# Patient Record
Sex: Male | Born: 1965
Health system: Southern US, Community
[De-identification: ages and names within clinical notes are randomized; demographics above are authoritative.]

## PROBLEM LIST (undated history)

## (undated) DIAGNOSIS — R7301 Impaired fasting glucose: Secondary | ICD-10-CM

## (undated) DIAGNOSIS — I219 Acute myocardial infarction, unspecified: Secondary | ICD-10-CM

## (undated) DIAGNOSIS — R002 Palpitations: Secondary | ICD-10-CM

## (undated) DIAGNOSIS — K219 Gastro-esophageal reflux disease without esophagitis: Secondary | ICD-10-CM

## (undated) DIAGNOSIS — E039 Hypothyroidism, unspecified: Secondary | ICD-10-CM

## (undated) DIAGNOSIS — E079 Disorder of thyroid, unspecified: Secondary | ICD-10-CM

## (undated) DIAGNOSIS — Z720 Tobacco use: Secondary | ICD-10-CM

## (undated) DIAGNOSIS — N529 Male erectile dysfunction, unspecified: Secondary | ICD-10-CM

## (undated) DIAGNOSIS — I10 Essential (primary) hypertension: Secondary | ICD-10-CM

## (undated) DIAGNOSIS — I82409 Acute embolism and thrombosis of unspecified deep veins of unspecified lower extremity: Secondary | ICD-10-CM

## (undated) DIAGNOSIS — E785 Hyperlipidemia, unspecified: Secondary | ICD-10-CM

## (undated) DIAGNOSIS — I3139 Other pericardial effusion (noninflammatory): Secondary | ICD-10-CM

## (undated) DIAGNOSIS — E119 Type 2 diabetes mellitus without complications: Secondary | ICD-10-CM

## (undated) DIAGNOSIS — J45909 Unspecified asthma, uncomplicated: Secondary | ICD-10-CM

## (undated) DIAGNOSIS — I313 Pericardial effusion (noninflammatory): Secondary | ICD-10-CM

## (undated) DIAGNOSIS — I2510 Atherosclerotic heart disease of native coronary artery without angina pectoris: Secondary | ICD-10-CM

## (undated) HISTORY — DX: Unspecified asthma, uncomplicated: J45.909

## (undated) HISTORY — DX: Hyperlipidemia, unspecified: E78.5

## (undated) HISTORY — DX: Atherosclerotic heart disease of native coronary artery without angina pectoris: I25.10

## (undated) HISTORY — DX: Impaired fasting glucose: R73.01

## (undated) HISTORY — DX: Essential (primary) hypertension: I10

## (undated) HISTORY — DX: Gastro-esophageal reflux disease without esophagitis: K21.9

## (undated) HISTORY — DX: Tobacco use: Z72.0

## (undated) HISTORY — DX: Palpitations: R00.2

## (undated) HISTORY — DX: Acute myocardial infarction, unspecified: I21.9

## (undated) HISTORY — DX: Disorder of thyroid, unspecified: E07.9

---

## 1998-03-01 HISTORY — PX: CARDIAC CATHETERIZATION: SHX172

## 1998-10-14 ENCOUNTER — Encounter (HOSPITAL_COMMUNITY): Admission: RE | Admit: 1998-10-14 | Discharge: 1999-01-12 | Payer: Self-pay | Admitting: Cardiology

## 1999-03-02 HISTORY — PX: CORONARY ANGIOPLASTY: SHX604

## 1999-03-02 HISTORY — PX: APPENDECTOMY: SHX54

## 1999-03-31 ENCOUNTER — Ambulatory Visit (HOSPITAL_COMMUNITY): Admission: RE | Admit: 1999-03-31 | Discharge: 1999-04-01 | Payer: Self-pay | Admitting: Cardiology

## 1999-07-08 ENCOUNTER — Inpatient Hospital Stay (HOSPITAL_COMMUNITY): Admission: EM | Admit: 1999-07-08 | Discharge: 1999-07-10 | Payer: Self-pay | Admitting: Emergency Medicine

## 1999-07-08 ENCOUNTER — Encounter: Payer: Self-pay | Admitting: Emergency Medicine

## 1999-07-08 ENCOUNTER — Encounter (INDEPENDENT_AMBULATORY_CARE_PROVIDER_SITE_OTHER): Payer: Self-pay | Admitting: Specialist

## 1999-07-31 HISTORY — PX: CORONARY ANGIOPLASTY WITH STENT PLACEMENT: SHX49

## 1999-08-06 ENCOUNTER — Ambulatory Visit (HOSPITAL_COMMUNITY): Admission: RE | Admit: 1999-08-06 | Discharge: 1999-08-07 | Payer: Self-pay | Admitting: Cardiology

## 2000-05-26 ENCOUNTER — Ambulatory Visit (HOSPITAL_COMMUNITY): Admission: AD | Admit: 2000-05-26 | Discharge: 2000-05-27 | Payer: Self-pay | Admitting: Cardiology

## 2000-12-06 ENCOUNTER — Observation Stay (HOSPITAL_COMMUNITY): Admission: RE | Admit: 2000-12-06 | Discharge: 2000-12-08 | Payer: Self-pay | Admitting: Cardiology

## 2001-01-29 DIAGNOSIS — I82409 Acute embolism and thrombosis of unspecified deep veins of unspecified lower extremity: Secondary | ICD-10-CM

## 2001-01-29 HISTORY — DX: Acute embolism and thrombosis of unspecified deep veins of unspecified lower extremity: I82.409

## 2001-01-29 HISTORY — PX: CORONARY ARTERY BYPASS GRAFT: SHX141

## 2001-02-13 ENCOUNTER — Inpatient Hospital Stay (HOSPITAL_COMMUNITY): Admission: AD | Admit: 2001-02-13 | Discharge: 2001-02-21 | Payer: Self-pay | Admitting: Cardiology

## 2001-02-14 ENCOUNTER — Encounter: Payer: Self-pay | Admitting: Cardiology

## 2001-02-16 ENCOUNTER — Encounter: Payer: Self-pay | Admitting: Surgery

## 2001-02-17 ENCOUNTER — Encounter: Payer: Self-pay | Admitting: Surgery

## 2001-02-18 ENCOUNTER — Encounter: Payer: Self-pay | Admitting: Surgery

## 2001-02-21 ENCOUNTER — Encounter: Payer: Self-pay | Admitting: Thoracic Surgery (Cardiothoracic Vascular Surgery)

## 2001-02-23 ENCOUNTER — Inpatient Hospital Stay (HOSPITAL_COMMUNITY): Admission: EM | Admit: 2001-02-23 | Discharge: 2001-03-04 | Payer: Self-pay | Admitting: Emergency Medicine

## 2001-02-23 ENCOUNTER — Encounter: Payer: Self-pay | Admitting: Thoracic Surgery (Cardiothoracic Vascular Surgery)

## 2001-02-23 ENCOUNTER — Encounter: Payer: Self-pay | Admitting: Emergency Medicine

## 2001-02-24 ENCOUNTER — Encounter: Payer: Self-pay | Admitting: Thoracic Surgery (Cardiothoracic Vascular Surgery)

## 2001-02-25 ENCOUNTER — Encounter: Payer: Self-pay | Admitting: Cardiothoracic Surgery

## 2001-02-26 ENCOUNTER — Encounter: Payer: Self-pay | Admitting: Vascular Surgery

## 2001-02-26 ENCOUNTER — Encounter: Payer: Self-pay | Admitting: Cardiothoracic Surgery

## 2001-02-27 ENCOUNTER — Encounter: Payer: Self-pay | Admitting: Thoracic Surgery (Cardiothoracic Vascular Surgery)

## 2001-03-30 ENCOUNTER — Encounter (HOSPITAL_COMMUNITY): Admission: RE | Admit: 2001-03-30 | Discharge: 2001-04-29 | Payer: Self-pay | Admitting: Cardiology

## 2001-05-01 ENCOUNTER — Encounter (HOSPITAL_COMMUNITY): Admission: RE | Admit: 2001-05-01 | Discharge: 2001-05-31 | Payer: Self-pay | Admitting: Cardiology

## 2001-06-02 ENCOUNTER — Encounter (HOSPITAL_COMMUNITY): Admission: RE | Admit: 2001-06-02 | Discharge: 2001-07-02 | Payer: Self-pay | Admitting: Cardiology

## 2001-07-03 ENCOUNTER — Encounter (HOSPITAL_COMMUNITY): Admission: RE | Admit: 2001-07-03 | Discharge: 2001-08-02 | Payer: Self-pay | Admitting: Cardiology

## 2002-05-31 ENCOUNTER — Encounter (HOSPITAL_COMMUNITY): Admission: RE | Admit: 2002-05-31 | Discharge: 2002-06-30 | Payer: Self-pay | Admitting: Orthopaedic Surgery

## 2003-08-02 ENCOUNTER — Ambulatory Visit (HOSPITAL_BASED_OUTPATIENT_CLINIC_OR_DEPARTMENT_OTHER): Admission: RE | Admit: 2003-08-02 | Discharge: 2003-08-02 | Payer: Self-pay | Admitting: Family Medicine

## 2005-10-15 ENCOUNTER — Ambulatory Visit: Payer: Self-pay | Admitting: Family Medicine

## 2005-10-19 ENCOUNTER — Encounter: Admission: RE | Admit: 2005-10-19 | Discharge: 2005-10-19 | Payer: Self-pay | Admitting: Family Medicine

## 2005-10-27 ENCOUNTER — Ambulatory Visit: Payer: Self-pay | Admitting: Family Medicine

## 2005-11-11 ENCOUNTER — Encounter: Admission: RE | Admit: 2005-11-11 | Discharge: 2005-11-11 | Payer: Self-pay | Admitting: Family Medicine

## 2006-01-10 ENCOUNTER — Ambulatory Visit: Payer: Self-pay | Admitting: Family Medicine

## 2007-09-11 ENCOUNTER — Ambulatory Visit: Payer: Self-pay | Admitting: Family Medicine

## 2007-09-18 IMAGING — US US ABDOMEN COMPLETE
1 series · 13 of 25 positions shown · non-contrast
Comparison: [REDACTED] abdominal CT report, 07/08/99.

CLINICAL DATA: Right upper epigastric abdominal pain. 
ABDOMEN ULTRASOUND:
TECHNIQUE: Complete abdominal ultrasound examination was performed including evaluation of the liver, gallbladder, bile ducts, pancreas, kidneys, spleen, IVC, and abdominal aorta.  The study is limited due to patient?s body habitus and overlying gastrointestinal gas.

[Series 1: unknown · 0.35mm/px · 13 of 74 slices shown]
[im 1/74]
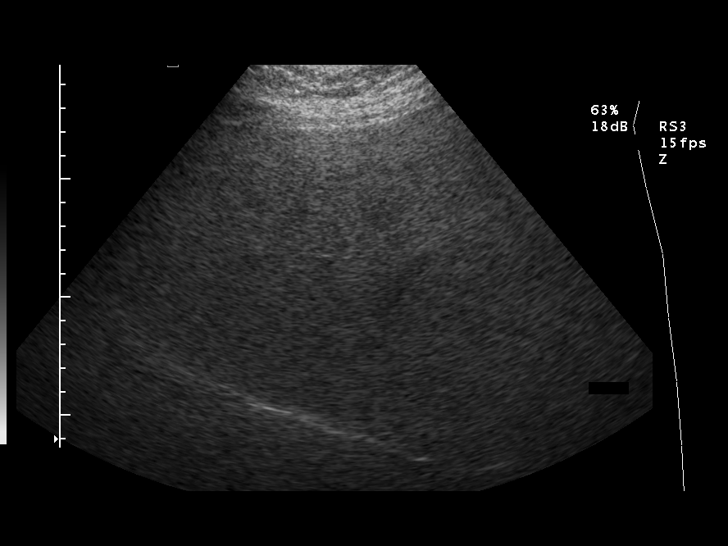
[im 7/74]
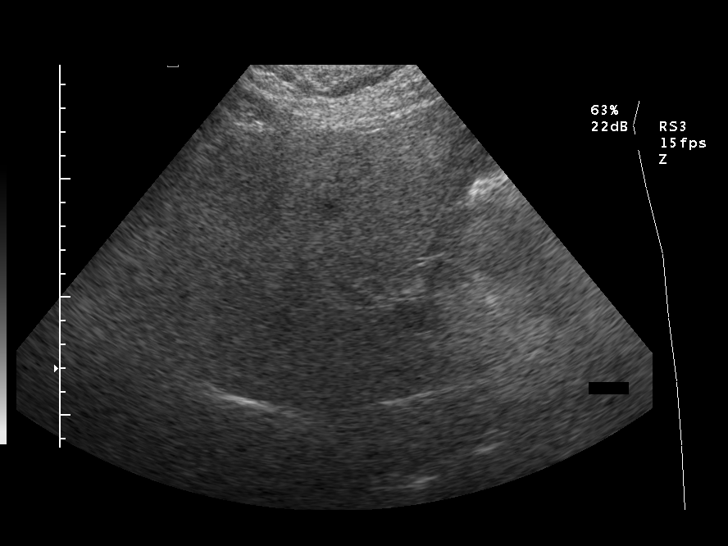
[im 13/74]
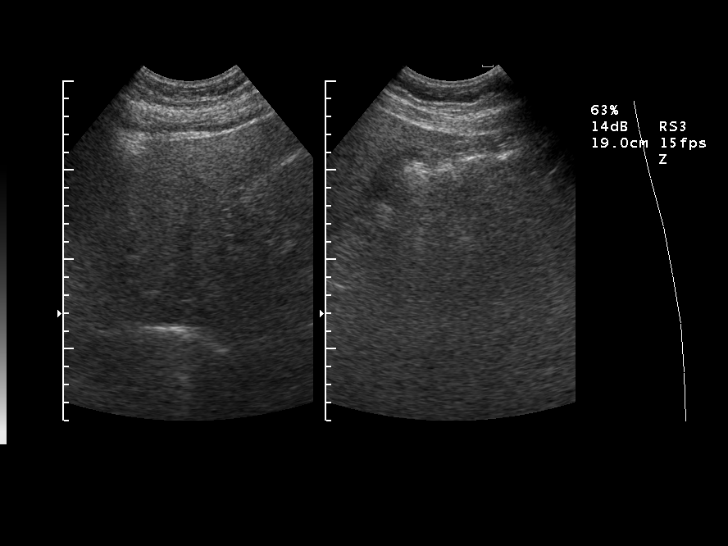
[im 19/74]
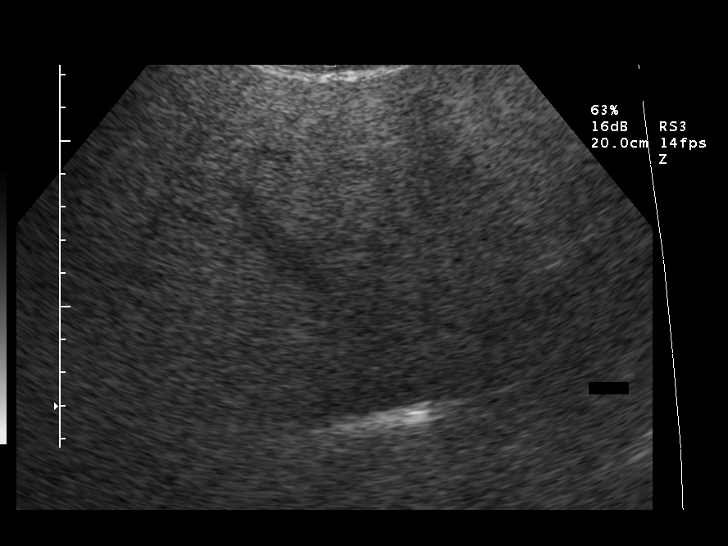
[im 25/74]
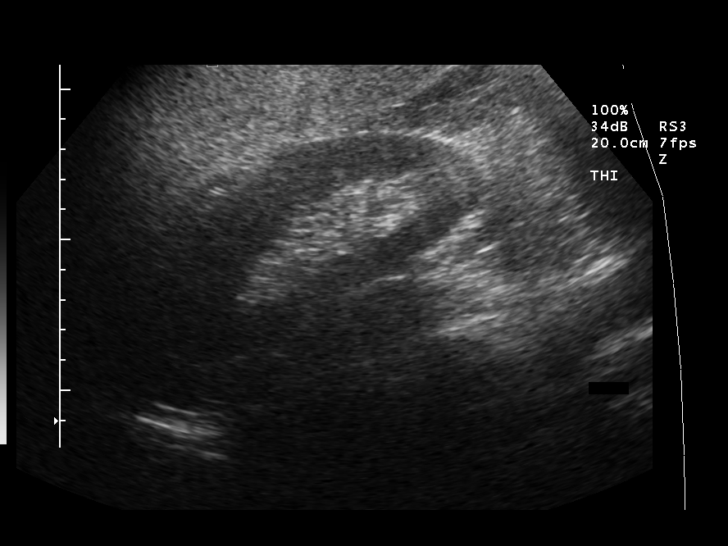
[im 31/74]
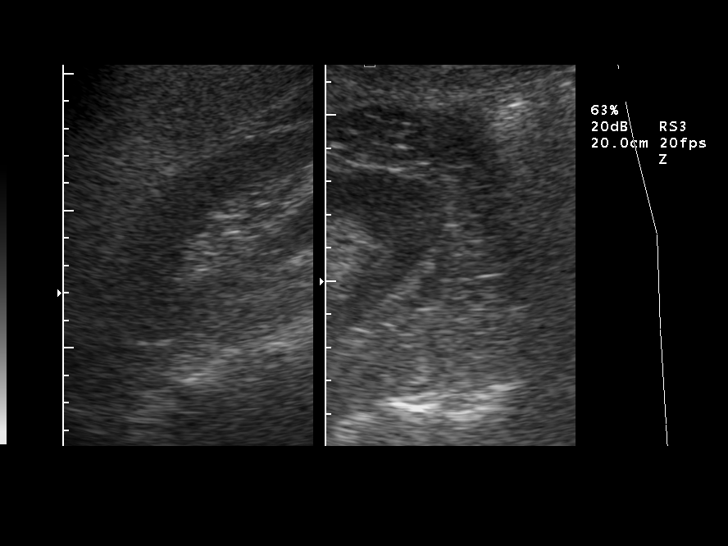
[im 37/74]
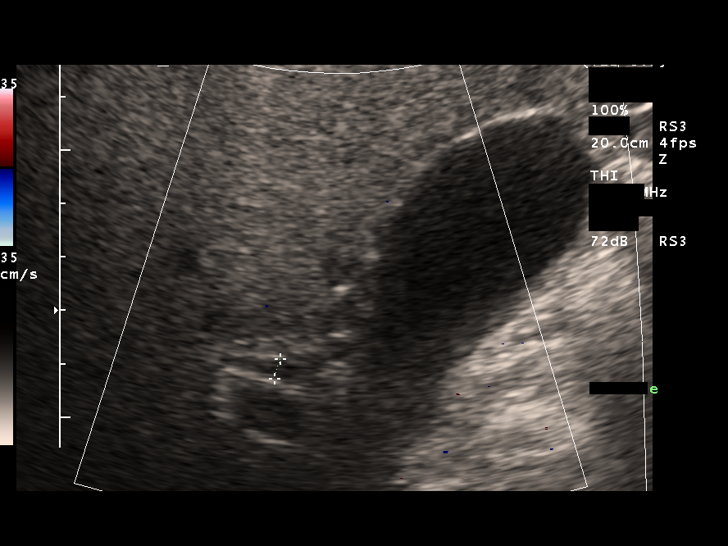
[im 43/74]
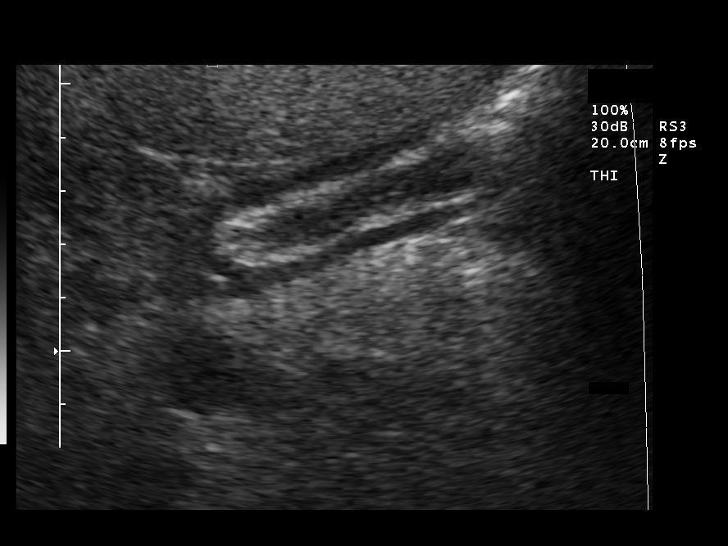
[im 49/74]
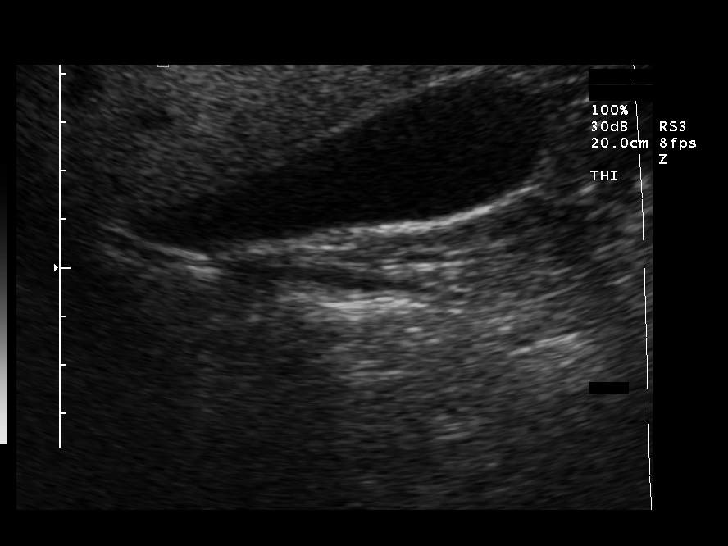
[im 55/74]
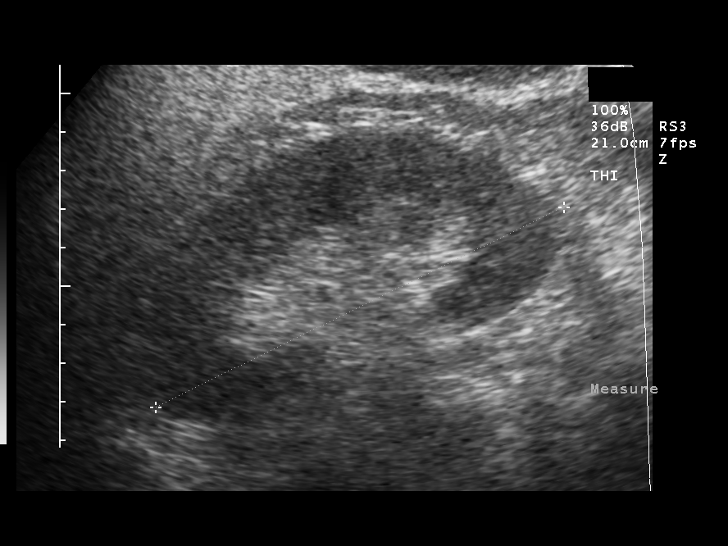
[im 61/74]
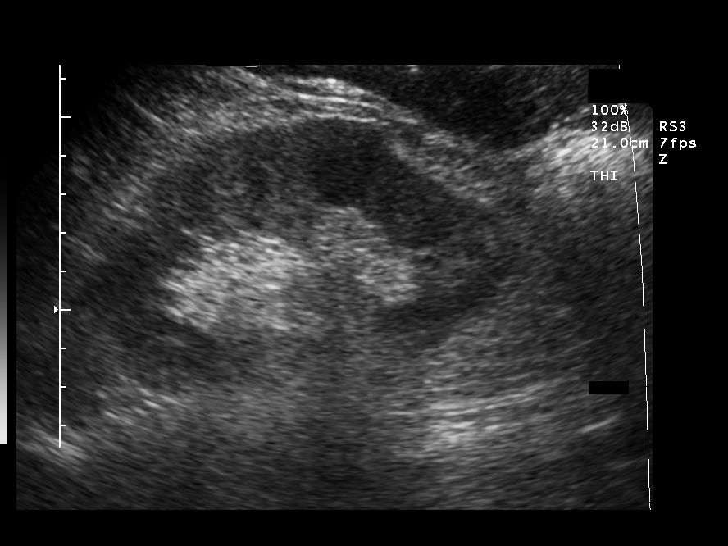
[im 67/74]
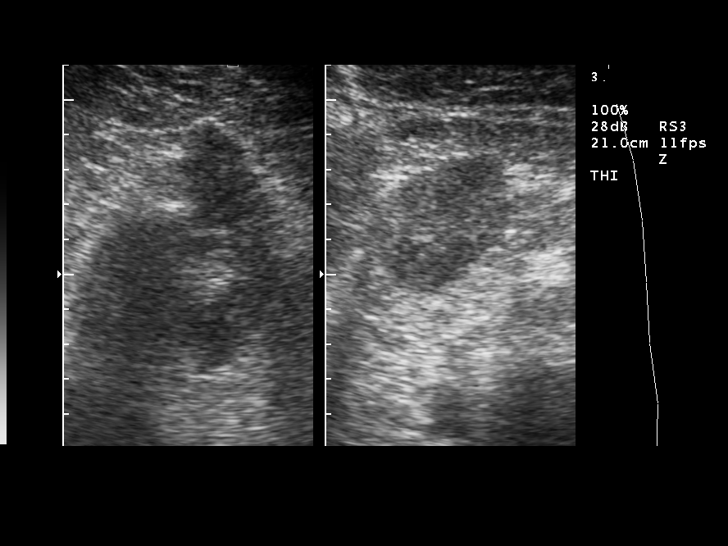
[im 74/74]
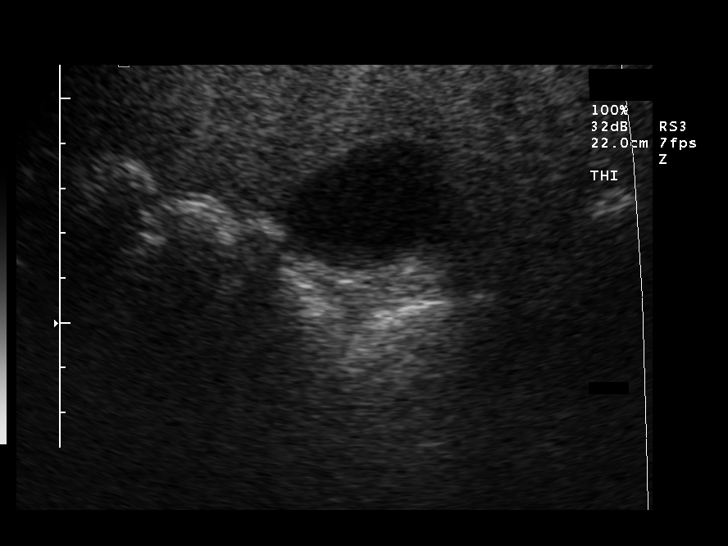

[13 of 25 positions shown; findings below may reference images not displayed]

FINDINGS: There is no evidence of gallstones or biliary ductal dilatation.   No focal liver lesions are seen.  The visualized portions of the IVC and pancreas are unremarkable.
There is no evidence of splenomegaly.  The kidneys are unremarkable, and there is no evidence of hydronephrosis.  The abdominal aorta is nondilated.
Gallbladder wall thickness 2 mm,  limited visualization yet normal caliber common bile duct 4 mm, slight diffuse increased liver echogenicity consistent with slight fatty change, spleen 3.3 x 10 cm, right kidney 11.8 cm long, left kidney 11.9 cm long, maximum visualized abdominal aorta 2.4 cm.
IMPRESSION: 1.  Limited assessment due to patient?s body habitus and overlying gastrointestinal gas. 
2.  Slight diffuse fatty infiltration of the liver.
3.  Limitations most marked in inferior vena cava and pancreatic visualization. 
4.  Otherwise negative.

## 2007-09-18 IMAGING — CR DG CHEST 2V
2 series · 2 of 2 positions shown · non-contrast
Comparison: none

CLINICAL DATA: Cough, congestion, history of prior CABG. 
 CHEST X-RAY: 
 Two views of the chest show the lungs to be clear.  The heart is within normal limits in size.  No bony abnormality is seen.  Median sternotomy sutures are noted from prior CABG.  Coronary artery stent is present.

[view not recorded (1 of 2)]
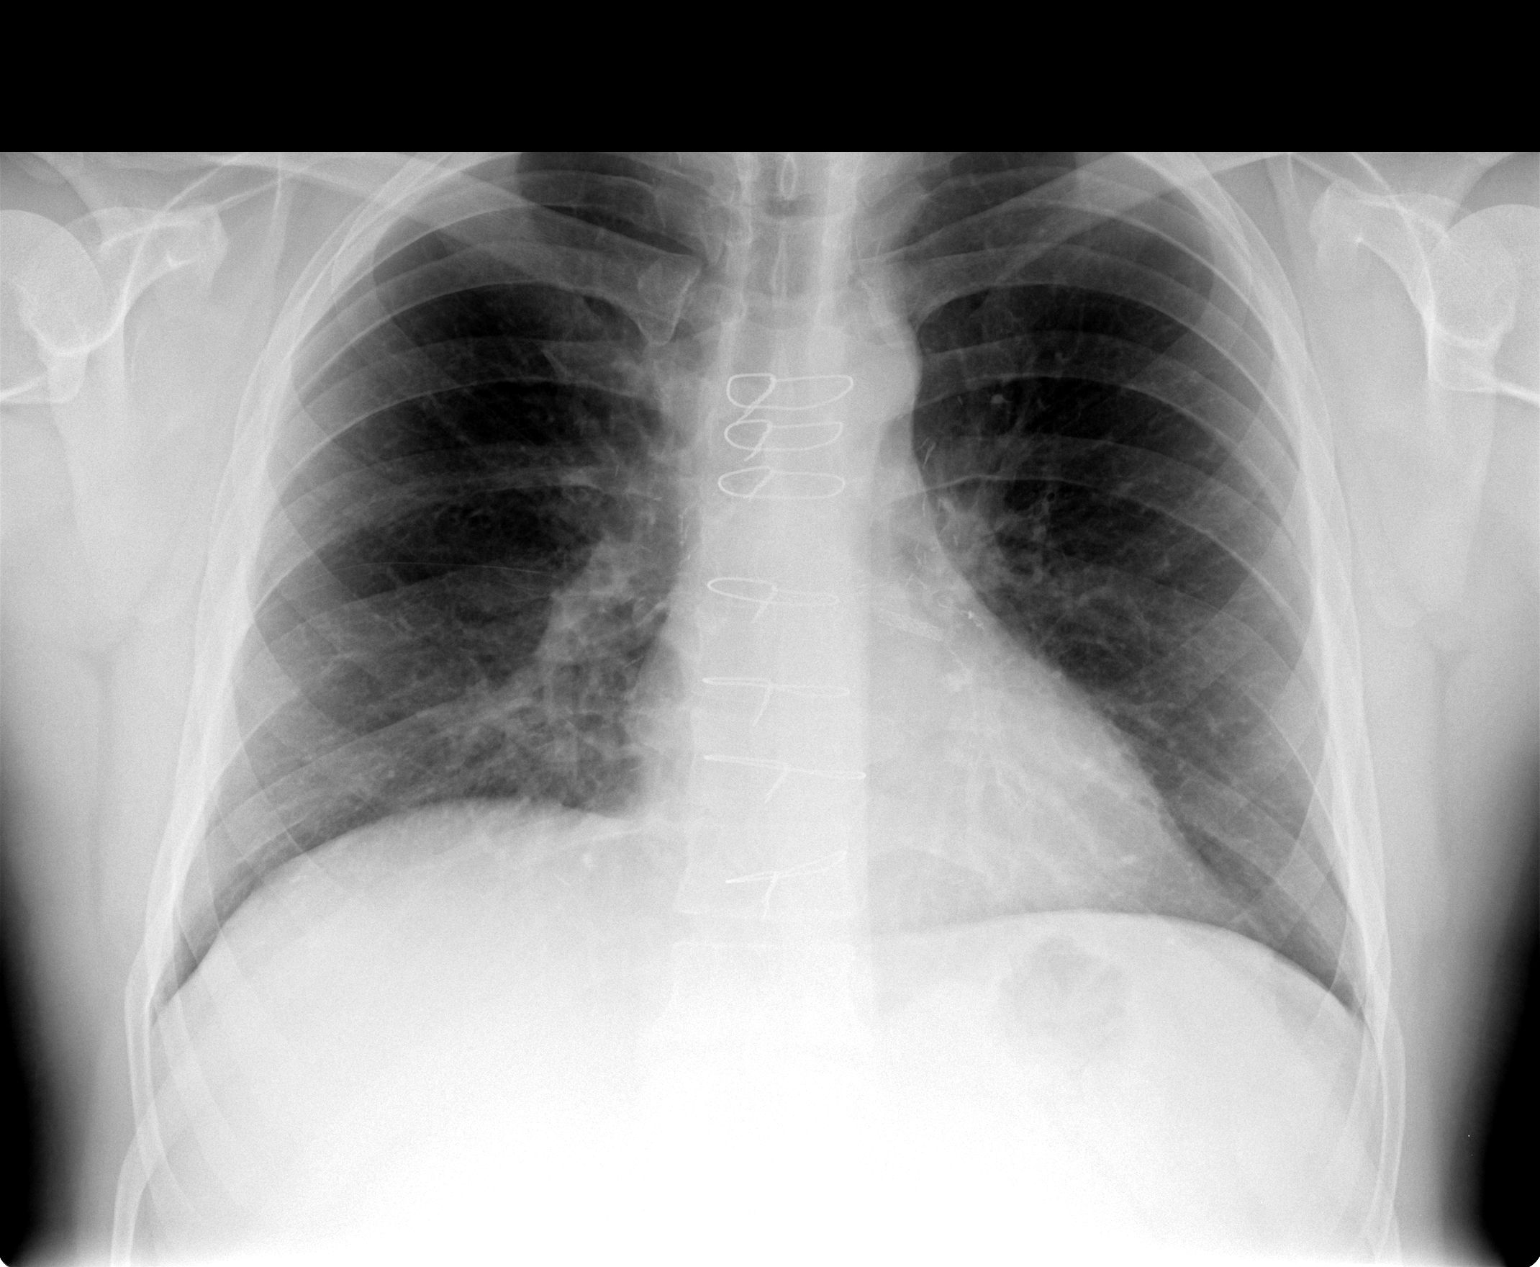

[view not recorded (2 of 2)]
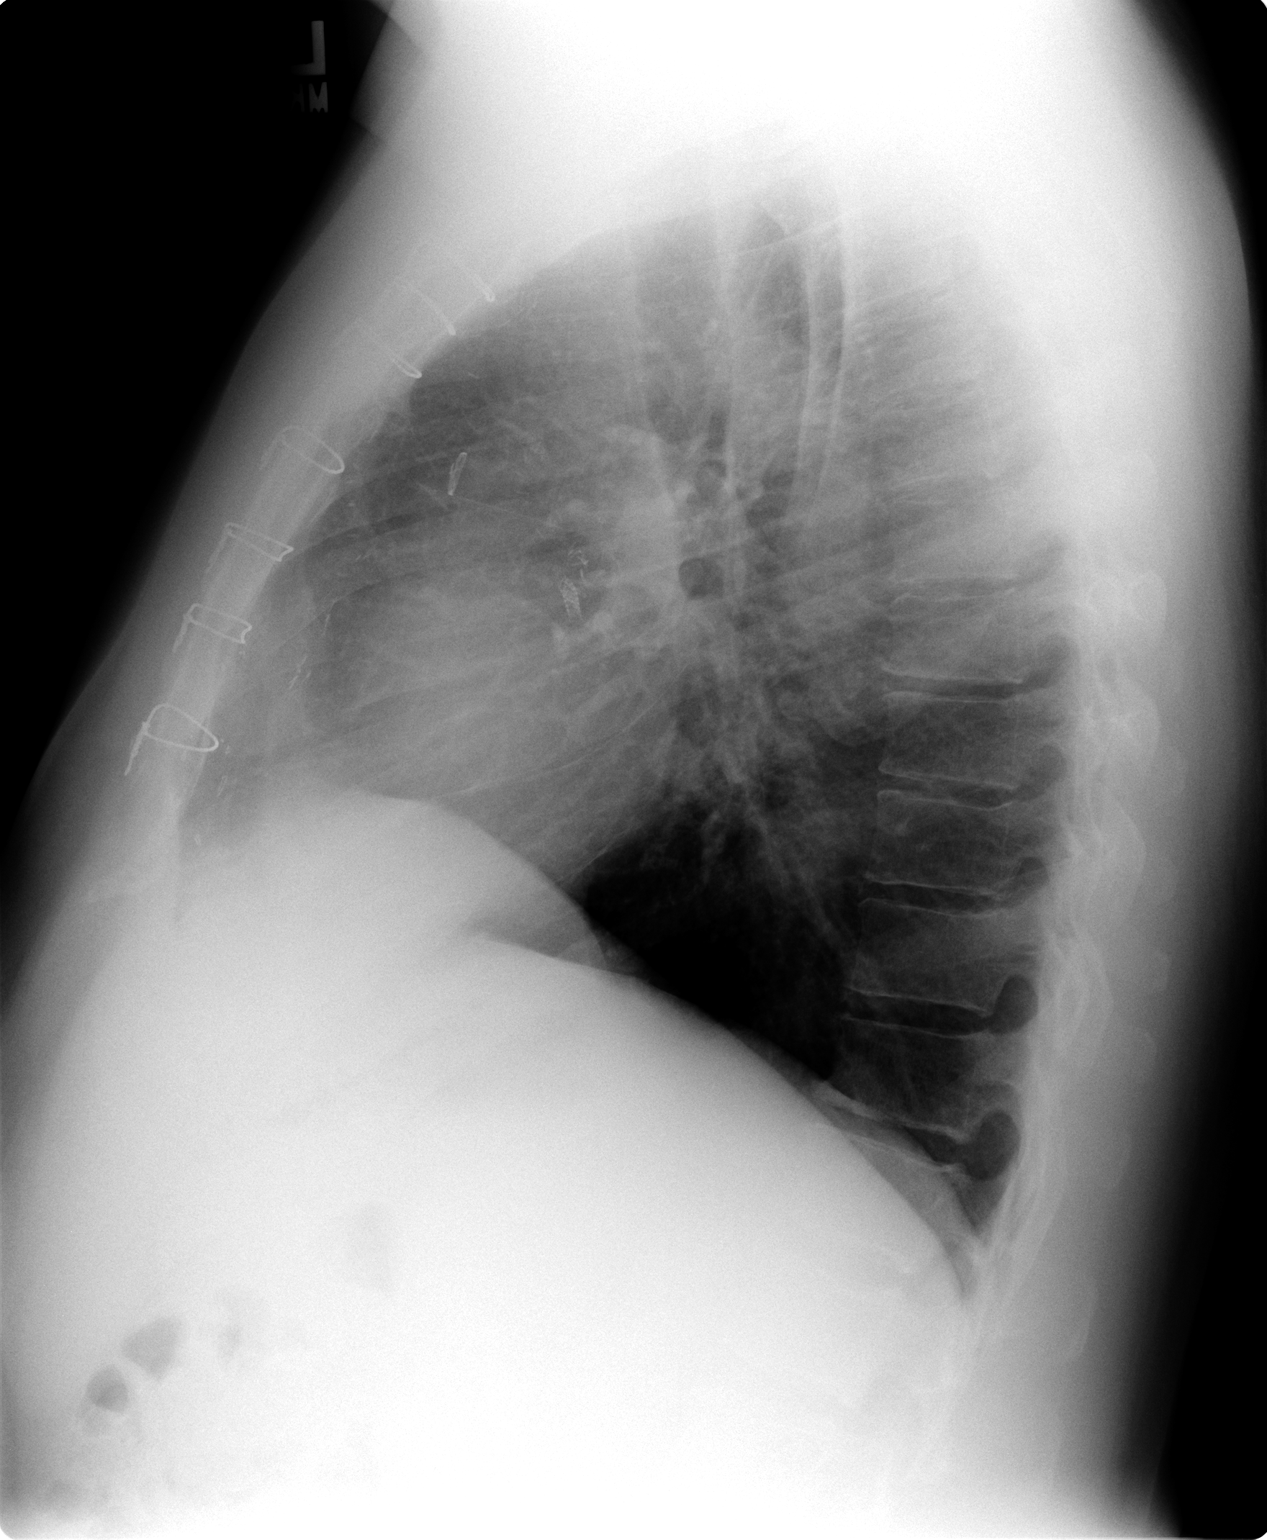

[2 of 2 positions shown; findings below may reference images not displayed]

IMPRESSION: No active lung disease.

## 2007-11-24 ENCOUNTER — Ambulatory Visit: Payer: Self-pay | Admitting: Family Medicine

## 2007-12-22 ENCOUNTER — Ambulatory Visit: Payer: Self-pay | Admitting: Family Medicine

## 2008-01-15 ENCOUNTER — Ambulatory Visit: Payer: Self-pay | Admitting: Family Medicine

## 2008-03-11 ENCOUNTER — Ambulatory Visit: Payer: Self-pay | Admitting: Family Medicine

## 2008-03-18 ENCOUNTER — Encounter: Admission: RE | Admit: 2008-03-18 | Discharge: 2008-06-16 | Payer: Self-pay | Admitting: Family Medicine

## 2008-03-29 ENCOUNTER — Ambulatory Visit: Payer: Self-pay | Admitting: Family Medicine

## 2008-07-12 ENCOUNTER — Ambulatory Visit: Payer: Self-pay | Admitting: Family Medicine

## 2009-01-27 ENCOUNTER — Ambulatory Visit: Payer: Self-pay | Admitting: Family Medicine

## 2009-06-03 ENCOUNTER — Ambulatory Visit: Payer: Self-pay | Admitting: Family Medicine

## 2009-08-15 ENCOUNTER — Ambulatory Visit: Payer: Self-pay | Admitting: Family Medicine

## 2009-11-14 ENCOUNTER — Ambulatory Visit: Payer: Self-pay | Admitting: Cardiology

## 2009-11-18 ENCOUNTER — Telehealth (INDEPENDENT_AMBULATORY_CARE_PROVIDER_SITE_OTHER): Payer: Self-pay

## 2009-11-19 ENCOUNTER — Ambulatory Visit: Payer: Self-pay

## 2009-11-19 ENCOUNTER — Encounter (HOSPITAL_COMMUNITY): Admission: RE | Admit: 2009-11-19 | Discharge: 2010-01-30 | Payer: Self-pay | Admitting: Cardiology

## 2009-11-19 ENCOUNTER — Ambulatory Visit: Payer: Self-pay | Admitting: Cardiology

## 2009-11-19 ENCOUNTER — Encounter: Payer: Self-pay | Admitting: Cardiology

## 2009-12-15 ENCOUNTER — Ambulatory Visit: Payer: Self-pay | Admitting: Family Medicine

## 2010-04-02 NOTE — Assessment & Plan Note (Signed)
Summary: Cardiology Nuclear Testing  Nuclear Med Background Indications for Stress Test: Evaluation for Ischemia, Graft Patency, Stent Patency, PTCA Patency   History: Angioplasty, CABG, Heart Catheterization, Myocardial Infarction, Myocardial Perfusion Study, Stents  History Comments: '00 LWMI> stent intermediate. multiple interventions> '02 Cath> CABGx4 '07 MPS: lateral infarct with mild reversibility.  Symptoms: Chest Pain with Exertion, Chest Pressure with Exertion, Dizziness, DOE, Fatigue  Symptoms Comments: Last CP 2 weeks ago.   Nuclear Pre-Procedure Cardiac Risk Factors: Family History - CAD, History of Smoking, Lipids, Smoker Caffeine/Decaff Intake: None NPO After: 7:00 PM Lungs: Clear IV 0.9% NS with Angio Cath: 22g     IV Site: R Hand IV Started by: Irean Hong, RN Chest Size (in) 44     Height (in): 69.5 Weight (lb): 204 BMI: 29.80  Nuclear Med Study 1 or 2 day study:  1 day     Stress Test Type:  Stress Reading MD:  Marca Ancona, MD     Referring MD:  P. [place] Resting Radionuclide:  Technetium 17m Tetrofosmin     Resting Radionuclide Dose:  11 mCi  Stress Radionuclide:  Technetium 56m Tetrofosmin     Stress Radionuclide Dose:  33 mCi   Stress Protocol Exercise Time (min):  13:01 min     Max HR:  153 bpm     Predicted Max HR:  174 bpm  Max Systolic BP: 201 mm Hg     Percent Max HR:  87.93 %     METS: 13.4 Rate Pressure Product:  56213    Stress Test Technologist:  Irean Hong,  RN     Nuclear Technologist:  Domenic Polite, CNMT  Rest Procedure  Myocardial perfusion imaging was performed at rest 45 minutes following the intravenous administration of Technetium 53m Tetrofosmin.  Stress Procedure  The patient exercised for 13 minutes and 01 second, RPE=15.  The patient stopped due to DOE, pain back of neck to (R) jaw,  and denied any chest pain.  There were nonspecific ST-T wave changes, occ. PVC's,PAC's, intermittent RBBB.  Technetium 42m Tetrofosmin was  injected at peak exercise and myocardial perfusion imaging was performed after a brief delay.  QPS Raw Data Images:  Normal; no motion artifact; normal heart/lung ratio. Stress Images:  Moderate basal to mid lateral perfusion defect.  Small apical anterior perfusion defect.  Rest Images:  Moderate basal to mid lateral perfusion defect.  Subtraction (SDS):  Fixed moderate basal to mid lateral perfusion defect.  Small reversible apical anterior perfusion defect.  Transient Ischemic Dilatation:  1.11  (Normal <1.22)  Lung/Heart Ratio:  0.30  (Normal <0.45)  Quantitative Gated Spect Images QGS EDV:  149 ml QGS ESV:  68 ml QGS EF:  54 % QGS cine images:  Basal lateral hypokinesis.    Overall Impression  Exercise Capacity: Excellent exercise capacity. BP Response: Hypertensive blood pressure response. Clinical Symptoms: Short of breath, no chest pain.  ECG Impression: No significant ST segment change suggestive of ischemia. Overall Impression: Fixed moderate basal to mid lateral perfusion defect suggestive of prior MI.  Small apical anterior perfusion defect that may represent a small area of ischemia.  Overall Impression Comments: Borderline reduced LV systolic function.

## 2010-04-02 NOTE — Progress Notes (Signed)
Summary: Nuc. Pre-Procedure  Phone Note Outgoing Call Call back at [facility] Phone [phone]   Call placed by: Irean Hong, RN,  November 18, 2009 3:08 PM Summary of Call: Reviewed information on Myoview Information Sheet (see scanned document for further details).  Spoke with patient's wife.     Nuclear Med Background Indications for Stress Test: Evaluation for Ischemia, Graft Patency, Stent Patency, PTCA Patency   History: Angioplasty, CABG, Myocardial Infarction, Myocardial Perfusion Study, Stents  History Comments: '00 LWMI> stent intermediate. multiple interventions> '02 CABGx4 '07 MPS: lateral infarct with mild reversibility.     Nuclear Pre-Procedure Cardiac Risk Factors: Family History - CAD, History of Smoking, Lipids

## 2010-05-26 ENCOUNTER — Other Ambulatory Visit (INDEPENDENT_AMBULATORY_CARE_PROVIDER_SITE_OTHER): Payer: 59 | Admitting: *Deleted

## 2010-05-26 DIAGNOSIS — E7800 Pure hypercholesterolemia, unspecified: Secondary | ICD-10-CM

## 2010-05-26 DIAGNOSIS — R0789 Other chest pain: Secondary | ICD-10-CM

## 2010-05-26 LAB — COMPREHENSIVE METABOLIC PANEL
ALT: 22 U/L (ref 0–53)
Albumin: 4.6 g/dL (ref 3.5–5.2)
CO2: 26 mEq/L (ref 19–32)
Calcium: 9.8 mg/dL (ref 8.4–10.5)
Chloride: 106 mEq/L (ref 96–112)
Glucose, Bld: 111 mg/dL — ABNORMAL HIGH (ref 70–99)
Potassium: 4.6 mEq/L (ref 3.5–5.3)
Sodium: 143 mEq/L (ref 135–145)
Total Bilirubin: 0.4 mg/dL (ref 0.3–1.2)
Total Protein: 6.9 g/dL (ref 6.0–8.3)

## 2010-05-26 LAB — LIPID PANEL
Cholesterol: 188 mg/dL (ref 0–200)
VLDL: 20 mg/dL (ref 0–40)

## 2010-05-28 ENCOUNTER — Telehealth: Payer: Self-pay | Admitting: *Deleted

## 2010-05-28 NOTE — Telephone Encounter (Signed)
LM for lab results.

## 2010-05-28 NOTE — Telephone Encounter (Signed)
Message copied by Murrell Redden on Thu May 28, 2010  9:23 AM ------      Message from: [place], PETER      Created: Wed May 27, 2010 12:47 PM       Last LDL was 93. Now 127. I suspect he's not taking 40 mg Crestor due to myalgias. Ok to cut back to 20 mg. He was taking Zetia in past. ? Tolerated. Watch diet closely.

## 2010-05-29 ENCOUNTER — Telehealth: Payer: Self-pay | Admitting: *Deleted

## 2010-05-29 NOTE — Telephone Encounter (Signed)
Notified of lab results. 

## 2010-06-29 ENCOUNTER — Other Ambulatory Visit: Payer: Self-pay | Admitting: Cardiology

## 2010-06-29 MED ORDER — ROSUVASTATIN CALCIUM 20 MG PO TABS: 20.0000 mg | ORAL_TABLET | Freq: Every day | ORAL | Status: DC

## 2010-06-29 NOTE — Telephone Encounter (Signed)
NEEDS SCRIPT CALLED INTO [facility]  20MG . CRESTOR

## 2010-06-29 NOTE — Telephone Encounter (Signed)
Called requesting refill on Crestor 20 mg to [facility]. Sent

## 2010-07-17 NOTE — H&P (Signed)
[facility]  Patient:    Todd Kelley, Todd Kelley                         MRN: [id number] Adm. Date:  [id number] Attending:  Charlton Haws                         History and Physical  ACCOUNT NUMBER:  [id number].  CHIEF COMPLAINT:  Abdominal pain.  CLINICAL HISTORY:  This patient is a 45 year old who yesterday morning, approximately 36 hours ago, began developing midabdominal pain, followed by nausea and frequent vomiting.  During the day, the vomiting finally ceased but he continued to have pain all night and the pain today has localized into his right lower quadrant.  He is no longer nauseated but he is not hungry.  He has not had any bowel movements or diarrhea.  He has had no fever or chills.  His only complaint is that of localizing pain.  OPERATIONS:  He has actually had a coronary stent placed about a year ago following an acute MI.  He had developed angina a few months ago and had redilatation of a small vessel.  He has been stable cardiac-wise since then.  ALLERGIES:  TETRACYCLINES.  MEDICATIONS:  Aspirin, Toprol and Lipitor.  FAMILY HISTORY:  Positive for coronary artery disease in his father.  REVIEW OF SYSTEMS:  HEENT:  Negative.  CHEST:  No cough or shortness of breath.  HEART:  As noted above.  ABDOMEN:  Negative except for HPI.  GU: Negative.  EXTREMITIES:  Negative.  ENDOCRINE:  Patient is also diagnosed as hypothyroid and is on Synthroid.  PHYSICAL EXAMINATION:  GENERAL:  Patient is a healthy-appearing 45 year old in no acute distress.  He is able to get up and down from the stretcher with ease.  He is alert and oriented.  HEENT:  Head is normocephalic.  Eyes nonicteric.  Pupils are round and regular.  NECK:  Supple.  There are no masses or thyromegaly.  LUNGS:  Clear to auscultation.  HEART:  Regular with no murmurs, rubs, or gallops.  ABDOMEN:  Tender in the right lower quadrant with some mild right lower quadrant  guarding.  He has referred rebound to the right lower quadrant and a positive Rovsing sign.  Bowel sounds are present.  RECTAL:  Not done.  EXTREMITIES:  No cyanosis or edema.  LABORATORY STUDIES:  White count of 9000.  Chemistries are normal.  Urinalysis negative.  IMAGING STUDIES:  Because of the normal laboratory studies and the fact that he is afebrile, we went ahead and got a CT with rectal contrast to confirm the clinical diagnosis of acute appendicitis.  He did appear to have appendicitis.  IMPRESSION: 1. Acute appendicitis. 2. Coronary artery disease. 3. Hypothyroidism.  PLAN:  Appendectomy.  We will try to do this laparoscopically but may need to convert to open. DD:  07/08/99 TD:  07/10/99 Job: [id number]

## 2010-07-17 NOTE — Cardiovascular Report (Signed)
[place]  Patient:    Todd Kelley, Todd Kelley                         MRN: [id number] Adm. Date:  [id number] Disc. Date: [id number] Attending:  [place], Peter Manning CC:         Elvina Sidle, M.D.                        Cardiac Catheterization  INDICATION FOR PROCEDURE:  The patient is a 45 year old white male status post acute myocardial infarction July 2000 at which time he underwent stenting of a large ramus intermedius branch.  He had a restenosis in January of 2001 with successful angioplasty at that time.  He now presents with a one-week history of recurrent anginal symptoms.  ACCESS:  Via the right femoral artery using the standard Seldinger technique.  EQUIPMENT:  The 6-French 4-cm right and left Judkins catheters, 6-French pigtail catheter, 6-French arterial sheath, 7-French arterial sheath, 7-French JL4 guide, a 3.0 x 15-mm Quantum Ranger balloon, a 0.014-inch extra support 300-cm wire, a 3.0 x 15-mm cutting balloon, an Atlantis IVUS catheter, and a 3.0 x 9-mm NIR Elite stent.  MEDICATIONS:  Nitroglycerin 200 mcg intracoronary x 2, morphine a total of 4 mg IV, Integrilin double bolus followed by continuous IV infusion, heparin 6000 units IV.  CONTRAST:  Omnipaque, 250 cc.  HEMODYNAMIC DATA:  Aortic pressure was 109/69 with a mean of 87 mmHg.  Left ventricular pressure was 107 with an EDP of 16 mmHg.  ANGIOGRAPHIC DATA:  The left coronary artery arises and distributes normally. 1. The left main coronary artery is normal. 2. The left anterior descending artery has mild calcification proximally.    There is 20% irregularity in the proximal vessel with no obstructive    disease. 3. There is a large ramus intermedius branch that has a visible stent in the    proximal vessel.  The proximal part of this stent is severely stenosed, up    to 95%.  There is also a severe stenosis of the distal margin of the stent    up to 90%.  There is diffuse  disease throughout the stented segment. 4. The left circumflex coronary is a small vessel with no significant    obstructive disease. 5. The right coronary artery is a large dominant vessel.  It has diffuse    ectasia.  There is also diffuse mild nonobstructive disease up to 30-40% in    the mid vessel and at the crux.  Left ventricular angiography in the RAO view demonstrates normal left ventricular size and contractility with normal systolic function.  Ejection fraction was estimated at 60%.  There was no mitral regurgitation or prolapse.  We proceeded at this point with intervention on the ramus intermedius branch for restenosis.  Initially, we performed intravascular ultrasound.  This demonstrated that the prior stent was fully expanded.  It also did adequately cover the ostium of the ramus intermedius.  Throughout this stented segment, there was severe diffuse stenosis consistent with intimal hyperplasia.  Just distal to the stent, there was also severe stenosis with severe plaque burden in this area.  This extended approximately 5 mm beyond the stent.  We initially predilated this lesion with a 3.0 x 15-mm Quantum Ranger balloon, dilating to four atmospheres in the distal margin of the stent and to eight atmospheres proximally within the stent.  We then placed a  3.0 x 9-mm NIR Elite stent in the distal stenosis with some overlap of the initial stent proximally.  This stent was deployed at eight atmospheres and then post dilated to 14 atmospheres.  This balloon was then exchanged for a 3.0 x 15-mm cutting balloon.  We performed sequential inflations throughout the stented segment up to six, and then later, seven atmospheres for 1.5 minutes.  While this yielded an improved angiographic result, there appeared to be still inadequate expansion in the proximal stented segment.  We then changed out for the Quantum Ranger balloon again and dilated this 3.0-mm balloon to 16 atmospheres  throughout the stented segment.  This yielded an excellent angiographic result.  There was approximately 10% residual stenosis in the proximally stented segment.  The mid to distal stent had 0% residual stenosis. There was TIMI-3 flow.  There were no intraluminal filling defects or dissections.  We followed up with intravascular ultrasound again, and this demonstrated excellent stent expansion including the newly placed stent with excellent transition into a normal segment of the vessel.  There was no significant atheroma within the entire stented segment.  FINAL INTERPRETATION: 1. Single-vessel obstructive atherosclerotic coronary artery disease. 2. Restenotic lesion within the ramus intermedius branch with diffuse in-stent    restenosis. 3. Normal left ventricular function. 4. Successful stenting and PTCA of the ramus intermedius branch. DD:  08/06/99 TD:  08/10/99 Job: [id number]

## 2010-07-17 NOTE — H&P (Signed)
[place]  Patient:    Todd Kelley, QUESNELL Visit Number: [id number] MRN: [id number]          Service Type: CAT Location: [facility]UB 2908 01 Attending Physician:  [place], Peter Manning Dictated by:   Peter M. [place], M.D. Admit Date:  12/06/2000   CC:         Elvina Sidle, M.D.   History and Physical  CHIEF COMPLAINT:  Chest pain.  HISTORY OF PRESENT ILLNESS:  [name] is a 45 year old white male with a known history of coronary disease.  He suffered an acute lateral myocardial infarction in July of 2000.  He underwent emergent stenting of the intermediate branch.  Since then he has had three episodes of restenosis.  His first episode was treated with balloon angioplasty.  The second episode was treated with a cutting balloon and placement of an additional 9 mm stent to cover the margin of the treated area.  With his last episode in March of 2002, he underwent dilatation with cutting balloon and had brachytherapy.  He now presents with a two-week history of increasing substernal chest pain.  This mid sternal and goes across his chest associated with tightness.  It is especially worse with stress and his symptoms have been progressive and becoming more severe.  He has had some shortness of breath and some sweating. Because of his recurrent anginal symptoms, the patient is now admitted for cardiac catheterization.  PAST MEDICAL HISTORY:  The patient has a history of hyperlipidemia and hypothyroidism.  He has a history of upper GI bleed due to a Mallory-Weiss tear.  He has chronic back pain.  He has a history of a urinary tract infection.  CURRENT MEDICATIONS: 1. Aspirin 325 mg daily. 2. Plavix 75 mg daily. 3. ______ 1000/40 mg per day. 4. Toprol XL 50 mg per day. 5. Zyrtec 10 mg daily p.r.n. 6. ______ 0.15 mg daily.  SOCIAL HISTORY:  The patient works for [employer].  He finds this work very stressful.  He has four children and is  married.  He quit smoking in July of 2000.  He denies alcohol use.  FAMILY HISTORY:  His father had coronary artery bypass surgery in his early 71s.  ALLERGIES:  The patient has no known allergies.  REVIEW OF SYSTEMS:  He has had some recent increased indigestion symptoms.  No change in bowel habits.  All other review of systems was negative.  PHYSICAL EXAMINATION:  The patient is a young white male in no apparent distress.  VITAL SIGNS:  His blood pressure is 118/88 and pulse 62 and regular.  HEENT:  Pupils are equal, round, and reactive.  The conjunctivae are clear. The oropharynx is clear.  NECK:  Supple without JVD, adenopathy, thyromegaly, or bruits.  LUNGS:  Clear to auscultation and percussion.  CARDIAC:  Regular rate and rhythm with normal S1 and S2 without murmurs, rubs, gallops, or clicks.  ABDOMEN:  Soft and nontender.  There is no hepatosplenomegaly, masses, or bruits.  EXTREMITIES:  The femoral and pedal pulses are 2+ and symmetric.  SKIN:  Warm and dry.  NEUROLOGIC:  Alert and oriented x 3.  Cranial nerves II-XII are intact.  No motor or sensory deficits.  LABORATORY DATA:  The CBC is normal.  The ECG shows normal sinus rhythm with left axis deviation, otherwise normal. The chest x-ray shows no active disease.  IMPRESSION: 1. Unstable angina pectoris. 2. Atherosclerotic coronary artery disease, status post stenting of the  intermediate branch with multiple episodes of restenosis. 3. Hyperlipidemia. 4. Hypothyroidism.  PLAN:  The patient will be admitted for cardiac catheterization. Dictated by:   Peter M. [place], M.D. Attending Physician:  [place], Peter Manning DD:  12/06/00 TD:  12/06/00 Job: [phone]

## 2010-07-17 NOTE — Discharge Summary (Signed)
[place]. [facility]  Patient:    Todd Kelley, Todd Kelley Visit Number: [id number] MRN: [id number]          Service Type: MED Location: 2000 2039 01 Attending Physician:  Charlett Lango Dictated by:   Lissa Merlin, P.A. Admit Date:  02/23/2001 Discharge Date: 03/04/2001   CC:         Peter M. [place], M.D.  CVTS office   Discharge Summary  ADMISSION DIAGNOSES: 1. Known coronary artery disease. 2. Unstable angina.  DISCHARGE DIAGNOSIS:  Severe three-vessel coronary artery disease, ejection fraction 65%.  PAST MEDICAL HISTORY: 1. Coronary artery disease with multiple percutaneous interventions. 2. Hyperlipidemia. 3. Hypothyroidism. 4. Mallory-Weiss tear with GI bleed, July 2000. 5. Chronic back pain.  PROCEDURES: 1. Cardiac catheterization on February 13, 2001, showing severe    three-vessel coronary artery disease with normal left ventricular function,    EF 65%. 2. Coronary artery bypass graft x4 on February 16, 2001, with the following    grafts:  LIMA to the LAD, saphenous vein graft sequentially from PD to    PL and right internal mammary artery to intermediate.  BRIEF HISTORY:  The patient is a 45 year old gentleman with a known history of early coronary artery disease, status post multiple percutaneous interventions to the intermediate coronary artery.  He presented with a three-week history of recurrent, progressive angina associated with exertion.  He came into the hospital and had a repeat cardiac catheterization which showed severe three-vessel coronary artery disease.  CVTS was consulted.  Dr. Laneta Simmers evaluation the catheterization data and examined Mr. Goodchild.  He recommended CABG as best treatment alternative.  Risks, benefits, details, and alternatives were discussed and it was agreed to proceed.  He underwent surgery on February 16, 2001.  There were no complications.  He was taken to SICU in guarded, but stable condition.  It was noted  during surgery that his vessels were very diffusely diseased.  Postoperatively, he did well with routine care and was transferred to unit 2000 on postoperative day #1.  There he progressed very well.  He did spike a low grade fever on postoperative day #3.  This was attributed to atelectasis.  UA was normal.  His fever subsided and by February 21, 2001, postoperative day #5, he was doing very well.  He was afebrile.  He was walking well in cardiac rehabilitation.  O2 saturations were satisfactory.  He was in sinus rhythm.  Laboratory work was satisfactory. Chest x-ray was satisfactory.  Physical examination was satisfactory.  He was stable and discharged home.  MEDICATIONS: 1. Toprol XL 25 mg 1 daily. 2. Synthroid 0.150 mg 1 daily. 3. Lipitor 20 mg 1 daily. 4. Niaspan 1000 mg daily. 5. Enteric coated aspirin 325 mg daily. 6. Darvocet-N 100 one tablet q.4h. as needed for pain.  ALLERGIES:  No known allergies.  CONDITION:  Stable.  SPECIAL INSTRUCTIONS:  He was told to continue his incentive spirometer, to avoid driving and heavy lifting over 10 pounds.  No strenuous activity.  He was told to walk daily.  He could shower.  He was told to clean his wound with soap and water daily and to be alert for increasing redness, swelling, drainage, or fever, and to call the office if he had any problems.  He was told to get a chest x-ray when he saw his cardiologist in two weeks and to bring it with him to see Dr. Laneta Simmers.  FOLLOWUP: 1. Dr. [place] two weeks after discharge. 2. Dr. Laneta Simmers  three weeks after discharge.  The office will call with the    appointment day and time. Dictated by:   Lissa Merlin, P.A. Attending Physician:  Charlett Lango DD:  03/13/01 TD:  03/14/01 Job: [id number]

## 2010-07-17 NOTE — Op Note (Signed)
[facility]  Patient:    Todd Kelley, Todd Kelley                         MRN: [id number] Proc. Date: 07/08/99 Adm. Date:  [id number] Attending:  Charlton Haws CC:         Elvina Sidle, M.D., [facility]             Peter M. Swaziland, M.D.                           Operative Report  ACCOUNT NUMBER:  [id number].  PREOPERATIVE DIAGNOSIS:  Acute appendicitis.  POSTOPERATIVE DIAGNOSIS:  Acute appendicitis.  OPERATION:  Laparoscopic appendectomy.  SURGEON:  Currie Paris, M.D.  ANESTHESIA:  General endotracheal.  CLINICAL HISTORY:  This patient is a 45 year old male with a classic history for appendicitis, but a normal white count and no fever; however, CT scan confirmed acute appendicitis.  DESCRIPTION OF PROCEDURE:  The patient was brought to the operating room and after satisfactory general endotracheal anesthesia had been obtained, the abdomen was prepped and draped and a Foley catheter placed.  The umbilical incision was made first using local prior to making the incision.  The fascia was entered and the peritoneal cavity entered.  There was omentum covering the right lower quadrant so it was not well-visualized.  A 5-mm trocar was placed in the right upper quadrant and a 10/11 in the left lower quadrant, both under direct vision.  With the patient tilted in Trendelenburg and slightly to the left, the appendix was readily visualized as an edematous inflamed appendix. I was able to grasp it and make a window at the base of the mesentery and put the Endo GIA through and fired it with the vascular staples, dividing the mesentery.  I put a couple of clips also on this area.  The base of the appendix was then well-visualized and the Endo GIA replaced and the appendix divided.  The appendix was brought out the umbilical port.  I reinserted that and checked for hemostasis; everything appeared to be dry, although during the course  of the dissection, there had been a little oozing around the base of the appendix.  I left some Surgicel there for several minutes and then rechecked and made sure it was still dry, took that out, as there appeared to be no other bleeding, but left some Surgicel in there since he had been on aspirin and I was concerned he might ooze a little postoperatively.  The remaining irrigant was suctioned out as best we could.  The left lower quadrant port was removed under direct vision; there was no bleeding.  The right upper quadrant trocar was likewise removed and no bleeding.  The abdomen was desufflated through the umbilical port, which was then closed with a pursestring placed at the beginning of the case.  Skin was closed with 4-0 Monocryl subcuticular plus Steri-Strips.  The patient tolerated the procedure well.  There were no operative complications.  All counts were correct. DD:  07/08/99 TD:  07/11/99 Job: [id number]

## 2010-07-17 NOTE — Discharge Summary (Signed)
[place]. [facility]  Patient:    Todd Kelley, Todd Kelley Visit Number: [id number] MRN: [id number]          Service Type: MED Location: 2000 2039 01 Attending Physician:  Charlett Lango Dictated by:   Dominica Severin, P.A. Admit Date:  02/23/2001 Disc. Date: 03/03/01   CC:         CVTS office  Peter M. Swaziland, M.D.   Discharge Summary  DATE OF BIRTH:  Mar 26, 1965.  PRIMARY ADMISSION DIAGNOSIS:  Pericardial effusion status post coronary artery bypass graft surgery on February 16, 2001.  SECONDARY DIAGNOSES/PAST MEDICAL HISTORY: 1. Coronary artery disease status post coronary artery bypass graft surgery. 2. Status post myocardial infarction in 2000 with percutaneous transluminal    coronary angioplasty/stent placement and multiple restents done for    restenosis. 3. Hypothyroidism. 4. Hyperlipidemia. 5. Remote history of gastrointestinal bleeding secondary to Mallory-Weiss    tears. 6. Chronic back pain. 7. Remote history of smoking, having quit two years ago.  NEW DIAGNOSES/DISCHARGE DIAGNOSES: 1. Pericardial effusion. 2. Pericarditis. 3. Right lower extremity profunda femoris deep venous thrombosis.  PROCEDURES: 1. A 2-D echocardiogram done on February 23, 2001. 2. Subxiphoid pericardial window done on February 24, 2001, by Mikey Bussing, M.D.  HOSPITAL COURSE:  This patient is a 45 year old Caucasian male who is status post coronary artery bypass graft surgery x 4 by Alleen Borne, M.D., on February 16, 2001.  He was discharged home on February 21, 2001.  He had complaint of some shortness of breath prior to discharge but chest x-ray reportedly was within normal limits.  Over the past two days prior to admission, he had complained of worsening dyspnea both at rest and with exertion.  It has been markedly worse the day of admission.  He had presented to [facility] [facility] [employer] Emergency Room also complaining of his breast  fluttering.  He specifically denied any chest pain at that time or any cough, fever or chills, lower extremity edema, nausea or diaphoresis.  He was admitted to our service.  A 2-D echocardiogram was done which revealed pericardial effusion.  The patient was seen by Dr. Donata Clay and he was scheduled for subxiphoid pericardial window to be done on February 24, 2001. In addition, he was also found to have a right lower extremity DVT.  He was taken to the OR on February 24, 2001, and underwent a subxiphoid pericardial window.  He tolerated the procedure well.  He was found to have pericarditis, was started on tapered dose of steroids.  He was started on anticoagulation therapies initially with Lovenox and later started on Coumadin on February 25, 2001, for his DVT.  He remained stable from a cardiac, respiratory and GI standpoint.  He was tolerating his diet without difficulty. His incisions remained clean and dry without any signs of infection or complication.  He was ambulating without difficulty.  Again, he was started on Coumadin on February 25, 2001.  On postop day #6, his INR was up to 1.7, a goal INR of 2 needed to be reached.  He is anticipated for discharge on the morning of March 03, 2001, pending his INR is therapeutic.  His cultures were sent from operating room and did not have any growth after three days.  CONDITION ON DISCHARGE:  Stable.  DISCHARGE MEDICATIONS: 1. Toprol 25 mg daily. 2. Niacin 1000 mg at bedtime. 3. Unithroid 0.15 mg daily. 4. Darvocet one or two tablets  every four hours as needed for pain. 5. Coumadin 5 mg tablets take as directed by Peter M. Swaziland, M.D. 6. Lipitor 20 mg daily.  ACTIVITY:  The patient is instructed not to do any driving, strenuous activity or heavy lifting.  He is to walk daily and continue breathing exercises.  DIET:  He is to follow a heart healthy diet.  WOUND CARE:  He was told he could shower.  He was told to notify the  office, and a number was given, if he has any increased temperatures greater than 101 or if he has any increased redness, swelling, or drainage from his incisions.  FOLLOW-UP:  He is to follow up with Dr. Swaziland for a Coumadin check probably on Monday, March 06, 2001, for PT/INR draw and then he would later follow with Dr. Swaziland in two weeks with a chest x-ray.  He is to bring that chest x-ray with him to his appointment with Dr. Laneta Simmers in three weeks on March 21, 2001, at 9:30 a.m. Dictated by:   Dominica Severin, P.A. Attending Physician:  Charlett Lango DD:  03/02/01 TD:  03/02/01 Job: [id number]

## 2010-07-17 NOTE — Discharge Summary (Signed)
[place]. [facility]  Patient:    Todd Kelley, CASASOLA                         MRN: [id number] Adm. Date:  [id number] Disc. Date: [id number] Attending:  [place], Peter Manning CC:         Collins Scotland, M.D.             Currie Paris, M.D.                           Discharge Summary  HISTORY OF PRESENT ILLNESS:  [name] is a 45 year old white male who suffered a lateral myocardial infarction in July, 2000, and was treated with stenting of a ramus intermediate branch.  He had restenosis in January, 2001, and underwent angioplasty at that time.  He now presents with a one-week history of recurrent angina.  He has a history of hypothyroidism, hypercholesterolemia.  For details of his past medical history, social history, family history, and physical examination, please see admission history and physical.  LABORATORY DATA:  Obtained on August 03, 1999, showed a TSH of 8.827.  CBC was normal.  Chemistry panel was normal except for an ALT of 65, cholesterol level 183, triglycerides 270, HDL 30, LDL 99.  Coags were normal.  HOSPITAL COURSE:  Patient was admitted for cardiac catheterization.  This demonstrated restenotic lesion in the large ramus intermediate branch at the site of previous stent.  Intravascular ultrasound demonstrated there was also severe plaque just distal to the stent.  There was diffuse, nonobstructive disease in the right coronary artery up to 40% with diffuse ectasia.  Left ventricular function was normal with ejection fraction of 60%.  Patient underwent repeat intervention of the ramus intermediate branch.  A 3.0 x 9 mm stent was placed distal to the prior stent to cover the severe plaque there and, then, the remainder of the stented segment was dilated initially with a cutting balloon and then with a 3.0 Quantum Ranger balloon to 16 atmospheres. This yielded an excellent angiographic result.  Patient had no complications following this  procedure.  He was treated with 18 hours of IV Integrilin.  His ECG remained unchanged.  CBC remained stable.  He was discharged home in improved condition on August 07, 1999.  DISCHARGE DIAGNOSES: 1. Recurrent unstable angina. 2. 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 3. Prior lateral myocardial infarction. 4. Hypothyroidism. 5. Hypercholesterolemia.  DISCHARGE MEDICATIONS: 1. Coated aspirin daily. 2. Plavix 75 mg daily for one month. 3. Lipitor 40 mg per day. 4. Synthroid dose was increased to 0.112 mg daily. 5. Toprol XL 50 mg per day. 6. Claritin 10 mg daily. 7. Nitroglycerin p.r.n.  ACTIVITIES:  Patient is to avoid heavy lifting or straining for one week.  DIET:  He is to follow a low-fat, low-cholesterol diet.  FOLLOW-UP:  He will follow up in one to two weeks with Dr. Swaziland.  DISCHARGE STATUS:  Improved. DD:  08/07/99 TD:  08/11/99 Job: [id number]

## 2010-07-17 NOTE — Cardiovascular Report (Signed)
[place]. [facility]  Patient:    Todd Kelley, Todd Kelley Visit Number: [id number] MRN: [id number]          Service Type: CAT Location: 2000 2017 01 Attending Physician:  [place], Peter Manning Dictated by:   Peter M. Swaziland, M.D. Proc. Date: 02/13/01 Admit Date:  02/13/2001   CC:         Elvina Sidle, M.D.             Alleen Borne, M.D.                        Cardiac Catheterization  INDICATIONS FOR PROCEDURE:  This is a 45 year old male with a history of multiple cardiac interventions who presents with recurrent Class IV angina.  ACCESS:  Via the right femoral artery using the standard Seldinger technique.  EQUIPMENT:  The 6-French 4-cm right and left Judkins catheters, 6-French pigtail catheter, 6-French arterial sheath.  MEDICATIONS:  Versed 2 mg IV.  HEMODYNAMIC DATA:  Aortic pressure is 109/76 with a mean of 92 mmHg.  Left ventricular pressure is 111 with an EDP of 12 mmHg.  ANGIOGRAPHIC DATA: 1. The left coronary artery arises and distributes normally. 2. The left main coronary artery is normal. 3. The left anterior descending artery has 40% narrowing proximally.  The    distal LAD as it wraps around the apex has a 95% stenosis. 4. The first diagonal branch is a small caliber vessel which has a 90%    stenosis at the site of previous angioplasty. 5. The intermedius branch at the site of the previous stent demonstrates a    95% stenosis at the ostium. 6. The left circumflex coronary artery gives rise to a small marginal branch.    There is a 50% stenosis at the origin of the circumflex. 7. The right coronary is a large dominant vessel.  It has diffuse ectasia and    bulky plaque.  In the mid vessel there is a 70-80% stenosis.  There is a    50% stenosis in the proximal PDA.  Faint collaterals are seen right-to-left    to the distal LAD.  Left ventricular angiography demonstrates normal left ventricular size and contractility with normal  systolic function.  Ejection fraction is estimated at 65%.  There is no mitral regurgitation or prolapse.  The aortic valve appears normal.  FINAL INTERPRETATION: 1. Severe three-vessel obstructive atherosclerotic coronary artery disease.    The patient has restenotic lesions involving the distal left anterior    descending and first diagonal branch and the intermedius branch.  He also    has worsening of stenosis in the mid right coronary artery. 2. Normal left ventricular function.  PLAN:  Based on these findings, would recommend coronary artery bypass surgery. Dictated by:   Peter M. Swaziland, M.D. Attending Physician:  [place], Peter Manning DD:  02/14/01 TD:  02/14/01 Job: [id number]

## 2010-07-17 NOTE — Discharge Summary (Signed)
[place]. [facility]  Patient:    Todd, Kelley Visit Number: [id number] MRN: [id number]          Service Type: MED Location: [id number] Attending Physician:  [place], Peter Manning Dictated by:   Peter M. Sw[place]il[place], M.D. Admit Date:  12/06/2000 Discharge Date: 12/08/2000   CC:         Dr. Collins Scotland, [facility]   Discharge Summary  HISTORY OF PRESENT ILLNESS:  Todd Kelley is a 45 year old white male with a history of coronary disease who presented with unstable angina symptoms.  He has had a previous acute lateral myocardial infarction July of 2000 and underwent direct stenting of the intermediate branch.  He had had multiple repeat interventions for restenosis of this lesion including brachytherapy in March of 2002.  He has a history of combined hyperlipidemia and hypothyroidism.  For details of his past medical history, social history, family history, and and physical examination, please see admission history and physical.  LABORATORY DATA:  His ECG showed normal sinus rhythm with very minor nonspecific T-wave abnormality.  His CBC was normal.  BMET was normal. TSH was 1.76.  Coags were normal.  HOSPITAL COURSE:  The patient was admitted and underwent cardiac catheterization.  This demonstrated a new 95% stenosis in the distal LAD. There was also an 80% stenosis in the first diagonal branch.  The intermediate vessel was still widely patent at the prior stent site with less than 20% residual stenosis.  There was a 50% stenosis in the proximal right coronary artery as well.  Left ventricular function was normal with ejection fraction of 60%.  We preceded at this point with angioplasty of the distal LAD and first diagonal branches.  This was performed using a 2.0 x 15 mm CrossSail balloon.  Both lesions were successfully dilated with an excellent angiographic result.  The patient did have a moderate amount of chest  pain persisting even after achieving a good angiographic result.  He was treated with IV nitroglycerin and IV Integrelin post procedure. Follow-up ECGs were negative x 2.  His CPK eight hours after the procedure was normal.  His chest pain gradually improved.  The following day, his Integrelin and nitroglycerin were discontinued.  He was progressively ambulating in the halls.  He continued to improve. He had no further complications and was discharged home in stable condition on December 08, 2000.  Due to his dyslipidemia, he was switched from Advicor to a combination of Lipitor and Niaspan.  We also increased his dose of Toprol XL.  DISCHARGE DIAGNOSES: 1. Unstable angina. 2. Two vessel obstructive atherosclerotic coronary artery disease. 3. Combined hyperlipidemia. 4. Hypothyroidism.  DISCHARGE MEDICATIONS: 1. Coated aspirin 325 mg daily. 2. Plavix 75 mg daily. 3. Lipitor 20 mg daily. 4. Niaspan 1000 mg q.h.s. 5. Toprol XL 100 mg daily. 6. B vitamins and folate daily. 7. Unithroid 0.15 mg daily. 8. Nitroglycerin sublingual p.r.n.  DISCHARGE INSTRUCTIONS:  The patient is instructed in progressive walking.  He is instructed in a low fat diet.  FOLLOW-UP:  He will follow up with Peter M. Swaziland, M.D., in two weeks.  Will determine the ability to return to work at that time.  We discussed discussed need for significant lifestyle modification.  DISCHARGE STATUS:  Improved. Dictated by:   Peter M. Swaziland, M.D. Attending Physician:  [place], Peter Manning DD:  12/08/00 TD:  12/08/00 Job: [phone]

## 2010-07-17 NOTE — H&P (Signed)
[place]. [facility]  Patient:    Todd Kelley, Todd Kelley                           MRN: [id number] Adm. Date:  05/26/00 Attending:  Peter M. Swaziland, M.D. CC:         Elvina Sidle, M.D.   History and Physical  CHIEF COMPLAINT:  Chest pain.  HISTORY OF PRESENT ILLNESS:  The patient is a 45 year old white male with a history of coronary artery disease.  He suffered a lateral myocardial infarction in July 2000 that was treated with direct stenting of the intermediate branch.  Patient had restenosis in January 2001 and was treated with angioplasty at that time.  He had a recurrent restenosis in June 2001 and was treated with a cutting balloon and an additional 9 mm of stent distal to the first stent.  He has done well since that time but now presents with a two to three-week history of recurrent exertional chest pain.  He describes this as a pain in his mid chest, radiating across his chest when he walks, and is clearly worse with exertion and increased stress.  This pain is the same pain that he has had previously.  Because of his recurrent anginal symptoms, he is now admitted for cardiac catheterization.  His last cardiac evaluation was in January 2002 with a stress Cardiolite study.  At that time he was able to walk 12 minutes without chest pain.  There were no significant ST segment changes. There was, on SPET imaging, a defect in the basal to mid anterolateral wall that appeared predominantly fixed and he had normal left ventricular function.  PAST MEDICAL HISTORY:  Significant for hypothyroidism.  He also a history of hypercholesterolemia.  He has had prior appendectomy in May 2001.  He has a history of coronary disease as noted above.  CURRENT MEDICATIONS: 1. Lipitor 40 mg per day. 2. Aspirin daily. 3. Toprol-XL 50 mg per day. 4. Unithroid 0.125 mg per day.  SOCIAL HISTORY:  Patient is married, he has four children.  He quit smoking in July 2000.  Denies  alcohol use.  He works for [employer].  FAMILY HISTORY:  Father had coronary artery bypass surgery in his 24s.  REVIEW OF SYSTEMS:  Patient has noticed some increased constipation and bloating.  He has also noticed some vague joint aches since his Lipitor dose was increased to 40 mg per day, whereas he had none of these complaints on lower dose.  All other review of systems is negative.  PHYSICAL EXAMINATION:  GENERAL:  Patient is a young white male in no apparent distress.  VITAL SIGNS:  Weight 221.5, blood pressure 138/96, pulse 62 with occasional extrasystoles.  HEENT:  Pupils are equal, round and reactive.  Conjunctivae are clear. Oropharynx is clear.  NECK:  Supple without JVD, adenopathy, thyromegaly, or bruits.  LUNGS:  Clear to auscultation and percussion.  CARDIAC:  Reveals a regular rate and rhythm, normal S1 and S2, without gallops, murmurs, rubs, or clicks.  ABDOMEN:  Soft and nontender.  He has no hepatosplenomegaly, masses, or bruits.  PULSES:  Femoral and pedal pulses are 2+ and symmetric.  SKIN:  Warm and dry.  NEUROLOGIC:  Nonfocal.  LABORATORY DATA:  Demonstrates normal sinus rhythm with occasional PVC, otherwise normal.  IMPRESSION: 1. Recurrent angina pectoris. 2. Status post lateral myocardial infarction in July 2000. 3. Status post stenting of the intermediate vessel with  two subsequent    episodes of restenosis. 4. Hypercholesterolemia. 5. Hypothyroidism.  PLAN:  The patient will be admitted for cardiac catheterization with further therapy pending these results. DD:  05/22/00 TD:  05/23/00 Job: [id number]

## 2010-07-17 NOTE — Cardiovascular Report (Signed)
[place]. [facility]  Patient:    Todd Kelley, Todd Kelley Visit Number: [id number] MRN: [id number]          Service Type: CAT Location: CCUB 2908 01 Attending Physician:  [place], Peter Manning Proc. Date: 12/06/00 Admit Date:  12/06/2000   CC:         Greer Ee, M.D., [facility]   Cardiac Catheterization  INDICATIONS:  The patient is a 45 year old male who has had previous stenting of an intermediate branch with multiple restenoses.  He last had angioplasty with brachytherapy in March of 2002.  He presents with recurrent unstable angina.  ACCESS:  Via right femoral artery using standard Seldinger technique.  EQUIPMENT:  6 French 4 cm right and left Judkins catheter, 6 French pigtail catheter, 6 French arterial sheath, 7 French arterial sheath, 7 [place] JL4 guide, 0.014 High-Torque floppy wire, and 2.0 x 15 mm Crossail balloon.  HEMODYNAMIC:  Aortic pressure is 129/79 with a mean of 103, left ventricular pressure 124 with EDP of 20 mmHg.  MEDICATIONS:  Heparin 5000 units IV, nitroglycerin sublingual x 1, nitroglycerin 200 mcg intracoronary x 2, total of 4 mg of morphine IV. Integrilin double bolus followed by continuous infusion was begun at the end of the procedure.  CONTRAST:  235 cc of Omnipaque.  ANGIOGRAPHIC DATA:  The left coronary artery arises and distributes normal.  The left main coronary artery is normal.  The left anterior descending artery has 20% narrowing in the proximal vessel. In the far distal LAD there is a 95% focal stenosis prior to the LAD wrapping around the apex.  The first diagonal branch has a segmental 80 to 90% stenosis in the midvessel.  A large intermediate branch demonstrates continued excellent patency proximally at the prior stent site.  There is less than 20% residual stenosis.  The left circumflex coronary artery is a small vessel without significant disease.  The right coronary artery is a very large  dominant vessel.  It has diffuse atherosclerosis with a 50% stenosis proximally.  There are diffuse 30% narrowings throughout the remainder of the vessel as well as in the distal PDA.  Left ventricular angiography in the RAO view demonstrates normal left ventricular size and contractility with normal systolic function.  Ejection fraction is estimated at 60%.  We proceeded at this point with angioplasty of the distal LAD and the first diagonal branch.  It was felt that the LAD was the culprit vessel.  In fact, the patient began having chest pain prior to intervention.  The lesion was crossed with the wire without difficulty.  We used the 2.0 mm balloon to perform a series of inflations up to a maximum of 14 atmospheres in the distal LAD.  We performed a two-minute inflation.  This yielded an excellent angiographic result with less than 10% residual stenosis.  There was some focal plaque disruption.  Antegrade flow was much improved.  Despite opening the vessel, however, the patient continued to have chest pain.  We then proceeded with angioplasty of the diagonal branch.  This vessel was easily Attending Physician:  [place], Peter Manning DD:  12/06/00 TD:  12/06/00 Job: [phone] [id number]

## 2010-07-17 NOTE — Cardiovascular Report (Signed)
[place]. [facility]  Patient:    MARKEESE, BOYAJIAN                         MRN: [id number] Proc. Date: 05/26/00 Adm. Date:  [id number] Disc. Date: [id number] Attending:  [place], Peter Manning CC:         Cardiac Catheterization Laboratory  Elvina Sidle, M.D., [facility]   Cardiac Catheterization  PROCEDURES PERFORMED:  Cardiac catheterization, angioplasty, and brachytherapy.  INDICATIONS FOR PROCEDURE:  The patient is a 45 year old, white male, who has had previous lateral infarction undergoing emergent stenting of an intermediate branch in July of 2000.  Since then, he has had two recurrent stenoses requiring angioplasty.  He now presents with recurrent angina symptoms.  ACCESS:  Via the right femoral artery and vein using the standard Seldinger technique.  EQUIPMENT:  A 6 French 4 cm right and left Judkins catheter, 6 French pigtail catheter, 8 French arterial sheath, 8 [place] JL4 guide, 0.014 Hi-Torque Floppy extra-support wire, a 3.0 x 15 mm Cutting Balloon, a 30 mm Beta-Cath system by [employer]  MEDICATIONS:  Versed a total of 3 mg IV, nitroglycerin 200 mcg intracoronary, heparin 10,000 IV with subsequent ACT of 372.  CONTRAST:  Omnipaque 250 cc.  HEMODYNAMIC DATA:  Aortic pressure is 111/71 with a mean of 89 mmHg.  Left ventricular pressure is 121 with an EDP of 11 mmHg.  ANGIOGRAPHIC DATA:  Left coronary artery:  The left coronary artery arises and distributes normally.  Left main:  The left main is short without significant disease.  Left anterior descending:  The left anterior descending artery is a large vessel.  It has mild calcification proximally.  There is 20-30% irregularity in the proximal vessel and also 30% narrowing in the distal LAD.  There is a large ramus intermediate branch which is stented proximally.  There is diffuse in-stent re-stenosis up to 95%.  The remainder of the vessel appears normal.  Left  circumflex:  The left circumflex coronary is otherwise a small vessel with no significant disease.  Right coronary artery:  The right coronary is a very large dominant vessel. It has diffuse ectasia with diffuse 30% disease in the proximal, mid and distal vessel.  LEFT VENTRICULAR ANGIOGRAPHY:  The left ventricular angiography in the RAO view demonstrates normal left ventricular size and contractility with normal systolic function.  Ejection fraction is estimated at 65%.  There is no mitral regurgitation or prolapse.  We proceeded at this point with intervention of the re-stenosis in the proximal intermediate branch.  After the patient was heparinized, this lesion was crossed with the guidewire.  We used a 3.0 x 15 mm Cutting Balloon to dilate the vessel performing three inflations up to 8 atmospheres.  The lesion length was 18-20 mm in length.  This yielded an excellent angiographic result with less than 10% residual stenosis.  We then placed the [facility] system across the lesion and radiation seeds were deployed for a total of 2001 seconds with 18.4 Gy delivered.  The patient had no complications.  Final result was less than 10% residual stenosis.  FINAL INTERPRETATION: 1. Single-vessel obstructive atherosclerotic coronary artery disease with    recurrent re-stenosis in the proximal intermediate branch at the site of    the previous stent. 2. Normal left ventricular function. 3. Successful percutaneous transluminal coronary angioplasty using a Cutting    Balloon and brachytherapy of the proximal intermediate branch. DD:  05/26/00  TD:  05/27/00 Job: [id number]

## 2010-07-17 NOTE — Cardiovascular Report (Signed)
[place]. [facility]  Patient:    Todd Kelley, Todd Kelley Visit Number: [id number] MRN: [id number]          Service Type: MED Location: [phone] Attending Physician:  [place], Peter Manning Dictated by:   Peter M. [place], M.D. Proc. Date: 12/06/00 Admit Date:  12/06/2000 Discharge Date: 12/08/2000   CC:         Elvina Sidle, M.D.  [facility]   Cardiac Catheterization  COMPLETION  INDICATIONS FOR PROCEDURE: The patient is a 45 year old white male, who is status post stenting of a an intermediate branch in July of 2000. He has had multiple episodes of re-stenosis and most recent in March of 2002, at which time he had brachytherapy. He presents now with recurrent unstable angina.  ACCESS: Via the right femoral artery using the standard Seldinger technique.  EQUIPMENT: The 6 French 4 cm right and left Judkins catheter, 6 French pigtail catheter, 6 French arterial sheath, 7 French arterial sheath, 7 [place] guide, a 0.014 Hi-Torque Floppy wire, and a 2.0 x 15 mm CrossSail balloon.  MEDICATIONS: Heparin 5000 units IV, nitroglycerin sublingual x1, nitroglycerin 200 mcg, intracoronary x2, a total of 4 mg of IV morphine, Integrilin double bolus followed by continuous IV infusion.  CONTRAST: Omnipaque 235 cc.  HEMODYNAMIC DATA: Aortic pressure 129/79 with a mean of 103.  Left ventricular pressure 124 with an EDP of 20 mmHg.  ANGIOGRAPHIC DATA: Left coronary artery: The left coronary artery arises and distributes normally.  Left main: The left main coronary artery is normal.  Left anterior descending: The left anterior descending artery has 20% narrowing in the proximal vessel. In the distal LAD there is a 95% focal stenosis prior to the LAD wrapping around the apex.  The first diagonal branch has a segmental stenosis of 80-90%.  There is a large intermediate branch which is stented proximally.  It remains widely patent with less  than 20% residual stenosis.  Left circumflex: The left circumflex coronary artery is small without significant disease.  Right coronary artery: The right coronary is a large dominant vessel. It has diffuse atherosclerosis with a 50% stenosis proximally. There are diffuse 30% narrowings throughout the mid and distal right coronary artery.  LEFT VENTRICULAR ANGIOGRAPHY: The left ventricular angiography in the RAO view demonstrates normal left ventricular size and contractility with normal systolic function. Ejection fraction is estimated at 60%.  We proceeded at this point with angioplasty in the distal LAD and the first diagonal branch. It was felt that the LAD was the culprit vessel.  In fact, the patient began having chest pain prior to intervention.  The lesion was crossed with the wire without difficulty. The 2.0 mm balloon was used to perform a series of inflations up to a maximum of 14 atmospheres for a maximal length of 2 minutes. This yielded a good angiographic result with less than 10% residual stenosis.  There was some focal plaque disruption with mild persistent haziness but antegrade flow was significantly improved. Despite opening the vessel, however, the patient continued to have chest pain. We proceeded at this point to dilate the diagonal branch.  This was also easily crossed and dilated up to 10 atmospheres with an excellent angiographic resulT and less than 10% residual stenosis. While the patient continued to complain of chest pain, his ECG was at baseline. His coronary flow was much improved angiographically.  He was transported to the holding area in stable condition.  FINAL INTERPRETATION: 1. Two-vessel obstructive  atherosclerotic coronary artery disease. 2. Successful percutaneous transluminal coronary angioplasty in the distal    left anterior descending and first diagonal branches. 3. Continued long-term patency of the intermediate branch at the prior stent     site. 4. Normal left ventricular function. Dictated by:   Peter M. [place], M.D. Attending Physician:  [place], Peter Manning DD:  12/08/00 TD:  12/09/00 Job: [id number]

## 2010-07-17 NOTE — H&P (Signed)
[place]. [facility]  Patient:    Todd Kelley, Todd Kelley                           MRN: Adm. Date:  03/31/99 Attending:  Peter M. [place], M.D. Dictator:   Peter M. [place], M.D. CC:         Collins Scotland, M.D. at [facility]                         History and Physical  CHIEF COMPLAINT:  Chest pain.  HISTORY OF PRESENT ILLNESS:  Mr. 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 He was admitted there and underwent stenting of a large intermediate vessel.  This was stented with a 2.5 x 18 mm [place] stent, and post-dilated to 3 mm.  This hospital course was complicated by a Mallory-Weiss tear and gastrointestinal bleed. Subsequent to this he has done very well.  He quit smoking at the time of his heart attack.  He has been exercising regularly, and had a follow up stress Cardiolite study on October 17, 1998, in which he was able to exercise for 12 minutes with no chest pain or ECG changes.  His Cardiolite showed a fixed defect involving the basal to mid lateral wall, and ejection fraction of 45%. However, over the past 2-3 months the patient has been experiencing typical symptoms of angina with mid substernal chest pain radiating to his neck associated with shortness of breath with exertion.  It is relieved with rest.  He thought this initially might of been related to a chest wall strain after he tried to lift a 100 pound bag of corn and felt something pulling in his chest, but his symptoms are not necessarily positional and are not exacerbated by deep breathing.  He has not tried any nitroglycerin.  The patient does have a history of hypercholesterolemia.  PAST MEDICAL HISTORY: 1. Status post non-Q-wave myocardial infarction. 2. Status post stenting in the intermediate coronary artery in July 2000. 3. Hypercholesterolemia. 4. History of upper gastrointestinal bleed secondary  to a Mallory-Weiss tear. 5. Chronic back pain. 6. History of urinary tract infection.  MEDICATIONS: 1. Lipitor 10 mg q.d. 2. Aspirin daily. 3. Toprol XL 50 mg q.d. recently added.  ALLERGIES:  No known drug allergies.  SOCIAL HISTORY:  The patient is married.  He has four children.  He works at [employer].  He previously smoked since age 17, but quit at the time of his heart attack in July 2000.  Denies alcohol use.  FAMILY HISTORY:  His father had coronary artery bypass surgery in his early 67s.  REVIEW OF SYSTEMS:  Otherwise unremarkable.  PHYSICAL EXAMINATION:  GENERAL:  The patient is a pleasant white male in no acute distress.  VITAL SIGNS:  Weight is 210 pounds.  Blood pressure is 124/90, pulse 76 and regular.  HEENT:  Pupils are equal, round and reactive to light and accommodation. Extraocular movements are full.  Funduscopic examination is benign.  Oropharynx is clear.  NECK:  Supple without JVD, adenopathy, thyromegaly, or bruits.  LUNGS:  Clear to auscultation and percussion.  CARDIAC:  Regular rate and rhythm, normal S1 and S2 without gallops, murmurs, rubs, or clicks.  ABDOMEN:  Soft, nontender.  There is hepatosplenomegaly, masses, or bruits.  EXTREMITIES:  Femoral and pedal pulses are 2+ and symmetric.  He has no  edema or phlebitis.  SKIN:  Warm and dry.  NEUROLOGIC:  Intact.  LABORATORY DATA:  ECG shows normal sinus rhythm with a normal ECG.  Other labs re pending.  IMPRESSION: 1. Recurrent angina pectoris, typical. 2. Status post non-Q-wave myocardial infarction. 3. Status post stenting of an intermediate coronary artery in July 2000. 4. Hypercholesterolemia. 5. History of Mallory-Weiss tear. 6. Prior history of tobacco abuse.  PLAN:  The patient will be admitted for cardiac catheterization. DD:  03/27/99 TD:  03/27/99 Job: [id number]

## 2010-07-17 NOTE — Op Note (Signed)
[name]  Patient:    Todd Kelley, Todd Kelley Visit Number: [id number] MRN: [id number]          Service Type: MED Location: [phone] Attending Physician:  Charlett Lango Dictated by:   Mikey Bussing, M.D. Proc. Date: 02/24/01 Admit Date:  02/23/2001   CC:         CVTS Office  [facility]   Operative Report  OPERATION:  Subxiphoid pericardial window and drainage of pericardial effusion.  PREOPERATIVE DIAGNOSES:  Postoperative pericardial effusion and shortness of breath.  POSTOPERATIVE DIAGNOSES:  Postoperative pericardial effusion and shortness of breath.  SURGEON:  Mikey Bussing, M.D.  ASSISTANT:  Loura Pardon, P.A.-C.  ANESTHESIA:  General.  INDICATIONS:  The patient is a 45 year old male, who had undergone coronary bypass grafting by Dr. Laneta Simmers approximately 10 days previously.  He returned with shortness of breath and was found by chest CT scan and 2-D echocardiogram to have a loculated, moderate pericardial effusion with some restriction on right ventricular filling.   He was recommended for drainage of the pericardial effusion with a subxiphoid window as the planned approach.  Prior to the operation, I discussed the patients diagnosis of pericardial effusion with some aspect of tamponade physiology with the patient and his family.  I discussed the recommended procedure, subxiphoid pericardial window, to treat his condition.  I discussed the major aspects of the procedure including the location of the incision, the use of general anesthesia, and the expected postoperative recovery.  I reviewed with the patient, the potential risks of bleeding and the potential need to perform a redo sternotomy in order to treat any unexpected bleeding or unexpected findings as surgery.  He understood these implications of the procedure and agreed to to proceed with the operation as planned under informed  consent.  OPERATIVE FINDINGS:  The patient had evidence of acute fibrinous pericarditis and epicarditis with 300 cc of thin, bloody fluid drained and cultures were sent for routine culture and Mycoplasma culture.  PROCEDURE:  The patient was brought to the operating room and placed supine on the operating table.  General anesthesia was induced under monitoring without hemodynamic change.  The patient was prepped and draped as a sterile field.  A subxiphoid incision was made down through the rectus fascia.  The incision had to be extended into the previous sternal incision in order to gain access to the pericardial space.  Using sharp and blunt dissection, the pericardium was identified and opened with a 15-scalpel blade.  There was immediate drainage of thin bloody fluid and the heart surface was seen.  The epicardium was covered with a fibrinous inflammatory exudate.  A loculated effusion on the left and posterolateral aspect of the left ventricle was opened and drained. An angled 36-French chest tube was placed in the posterior pericardial space and brought out through a separate incision.  The incision was then closed with interrupted #1 Vicryl for the deep fascia, 2-0 Vicryl was used to close the subcutaneous and a running Vicryl for the skin.  Sterile dressings were applied and the patient was extubated and returned to the recovery room in stable condition. Dictated by:   Mikey Bussing, M.D. Attending Physician:  Charlett Lango DD:  02/24/01 TD:  02/24/01 Job: [id number]

## 2010-07-17 NOTE — H&P (Signed)
[place]. [facility]  Patient:    Todd Kelley, Todd Kelley                         MRN: [id number] Adm. Date:  [id number] Disc. Date: [id number] Attending:  Charlton Haws CC:         Elvina Sidle, M.D.             Currie Paris, M.D.                         History and Physical  HISTORY OF PRESENT ILLNESS:  Mr. Warmuth is a 45 year old white male who suffered a non-Q-wave lateral myocardial infarction July of 2000. He underwent stenting of the proximal intermediate vessel at that time. He subsequently developed recurrent anginal symptoms and underwent angioplasty of the same lesion in January of 2001. He has done well since then. Approximately three to four weeks ago, he underwent emergent appendectomy for appendicitis. Following surgery, when he began to resume his normal activities, he started noting recurrent anginal symptoms. This is described as a mid substernal chest pain radiating up into his throat associated with walking or mowing his grass. These are similar symptoms to what he had before. Because of his recurrent symptoms within a window of restenosis, patient is admitted for cardiac catheterization.  PAST MEDICAL HISTORY: 1. Status post non-Q-wave myocardial infarction. 2. Status post stenting of the intermediate coronary artery in July of 2000    with PTCA of the same lesion in January of 2001. 3. Hypercholesterolemia. 4. History of GI bleed secondary to Mallory-Weiss tear. 5. Chronic back pain. 6. History of urinary tract infection. 7. Hypothyroidism. 8. Status post appendectomy.  CURRENT MEDICATIONS: 1. Lipitor 40 mg per day. 2. Aspirin daily. 3. Synthroid 0.1 mg daily. 4. Toprol XL 50 mg per day. 5. Claritin 10 mg per day.  ALLERGIES:  No known allergies.  FAMILY HISTORY:  Father had coronary artery bypass surgery in his early 44s.  SOCIAL HISTORY:  The patient is married. He has four children. He works at [employer]. He  previously smoked but quit in July of 2000. He denies alcohol use.  REVIEW OF SYSTEMS:  Otherwise unremarkable.  PHYSICAL EXAMINATION:  GENERAL:  The patient is a pleasant, white male in no distress.  VITAL SIGNS:  Weight 212, blood pressure is 130/96, pulse is 68 and regular.  HEENT:  Unremarkable. Extraocular movements are full. Funduscopic exam is benign. Oropharynx is clear.  NECK:  Without JVD, adenopathy, thyromegaly, or bruits.  LUNGS:  Clear to auscultation and percussion.  CARDIAC:  Reveals regular rate and rhythm. Normal S1 and S2 without murmurs, rubs, clicks, or gallops.  ABDOMEN:  Soft and nontender with a well-healed surgical scar in the right lower quadrant. He has no hepatosplenomegaly, masses, or bruits.  EXTREMITIES:  Femoral and pedal pulses are 2+ and symmetric. There is no edema.  NEUROLOGICAL:  Intact.  LABORATORY DATA:  Pending.  IMPRESSION: 1. Recurrent angina pectoris. 2. Status post non-Q-wave myocardial infarction. 3. Status post stenting of intermediate coronary artery with subsequent    angioplasty for restenosis. 4. Hypercholesterolemia. 5. History of Mallory-Weiss tear. 6. Status post appendectomy. 7. Hypothyroidism.  PLAN:  The patient will be admitted for cardiac catheterization with further therapy pending these results. DD:  08/03/99 TD:  08/03/99 Job: [id number]

## 2010-07-17 NOTE — H&P (Signed)
[place]. [facility]  Patient:    Todd Kelley, Todd Kelley Visit Number: [id number] MRN: [id number]          Service Type: MED Location: [phone] Attending Physician:  [place], Peter Manning Dictated by:   Peter M. [place], M.D. Admit Date:  12/06/2000 Discharge Date: 12/08/2000                           History and Physical  HISTORY OF PRESENT ILLNESS:  [name] is a 45 year old white male with known history of coronary artery disease who presents with recurrent progressive angina.  He is status post initial cardiac event in July 2000 when he presented to [facility] with an acute lateral myocardial infarction.  He underwent emergent stenting of an intermediate branch at that time. Subsequent to that, he has required multiple interventions for restenosis. His initial restenosis was in January 2001 and he underwent angioplasty of the intermediate branch.  He had subsequent restenosis in June 2001 and he underwent intervention with a cutting balloon as well as placement of an additional 9 mm stent.  With his next recurrence in March 2002, he underwent, again, dilatation with a cutting balloon and then had brachytherapy using a BetaCath system.  His last admission in October 2002 with recurrent angina did demonstrate that this vessel remained patent.  However, he developed significant stenosis in the distal LAD as well as moderate severe stenosis in the diagonal branch.  These were both treated with angioplasty.  He did well initially following this procedure.  However, now he presents with three-week history of progressive angina.  This is described as mid substernal chest pain radiating up into his throat and teeth.  Associated with shortness of breath and marked fatigue.  Because of his recurrent symptoms and strong likelihood of restenosis he is admitted for repeat cardiac catheterization.  PAST MEDICAL HISTORY: 1. ASCAD as noted above. 2. Hypothyroidism. 3.  Hyperlipidemia. 4. Remote history of GI bleed due to Mallory-Weiss tear. 5. Chronic back pain. 6. Prior history of urinary tract infection.  CURRENT MEDICATIONS: 1. Aspirin 325 mg daily. 2. Toprol-XL 100 mg daily. 3. Unithroid 0.150 mg daily. 4. Plavix 75 mg daily. 5. Lipitor 20 mg q.h.s. 6. Niaspan 1000 mg q.h.s.  SOCIAL HISTORY:  Patient works for [employer].  He is married and has four children.  He quit smoking in July 2000.  He denies alcohol use.  FAMILY HISTORY:  Father had coronary artery bypass surgery in his early 39s.  REVIEW OF SYSTEMS:  Is as noted in HPI.  Otherwise, review of systems is negative.  PHYSICAL EXAMINATION:  GENERAL:  Patient is a young white male in no apparent distress.  VITAL SIGNS:  Weight 217.  Blood pressure 134/92, pulse 80 and regular.  HEENT:  Unremarkable.  NECK:  He has no JVD or bruits.  LUNGS:  Clear.  CARDIAC:  Reveals regular rate and rhythm without gallops, murmurs, rubs, or clicks.  ABDOMEN:  Soft and nontender.  He has no hepatosplenomegaly, masses, or bruits.  EXTREMITIES:  Femoral and pedal pulses are 2+ and symmetric.  He has no edema.  NEUROLOGIC:  Nonfocal.  LABORATORY DATA:  ECG demonstrates normal sinus rhythm, normal ECG.  IMPRESSION: 1. Recurrent progressive angina pectoris. 2. Atherosclerotic coronary artery disease status post multiple coronary    interventions. 3. Hyperlipidemia. 4. Hypothyroidism.  PLAN:  Admit for cardiac catheterization with further therapy pending these results. Dictated by:  Peter M. [place], M.D. Attending Physician:  [place], Peter Manning DD:  02/10/01 TD:  02/10/01 Job: [id number]

## 2010-07-17 NOTE — Consult Note (Signed)
[place]  Patient:    Todd Kelley, Todd Kelley Visit Number: [id number] MRN: [id number]          Service Type: MED Location: [phone] Attending Physician:  Charlett Lango Dictated by:   Peter M. [place], M.D. Proc. Date: 02/23/01 Admit Date:  02/23/2001   CC:         Todd Kelley, M.D.  Salvatore Decent Dorris Fetch, M.D.  Elvina Sidle, M.D.   Consultation Report  HISTORY OF PRESENT ILLNESS:  [name] is a 45 year old white male well known to me.  He has a history of coronary artery disease with prior myocardial infarction in July 2000.  The patient has had multiple coronary interventions since that time with multiple episodes.  The patient has had multiple coronary interventions since that time with multiple episodes of restenosis.  He was admitted December 16, 200 with unstable angina.  Repeat cardiac catheterization showed severe three-vessel coronary disease.  He subsequently underwent coronary artery bypass surgery on February 16, 2001, with an LIMA graft to the LAD, a right internal mammary artery graft to the intermediate branch, and a saphenous vein graft to the posterior descending artery and posterolateral branches.  His postoperative course was complicated only by a low-grade fever and atelectasis.  He was discharged home in stable condition on February 21, 2001.  Last evening, the patient developed worsening shortness of breath.  He had mild cough which was nonproductive.  He felt weak and lightheaded.  His breathing was worse when lying down, and the patient was unable to get into a comfortable position.  He had no palpitations.  In the emergency room, he was noted to be tachypneic and tachycardic with hypoxemia.  His chest x-ray shows cardiomegaly with left lower lobe atelectasis and effusion.  CT of the chest was obtained that showed no pulmonary embolus.  However, there was a large pericardial effusion posterior and lateral  to the heart.  He also had left lower lobe atelectasis and moderate left effusion and moderate right pleural effusion.  PAST MEDICAL HISTORY: 1. ASCAD status post myocardial infarction with multiple coronary    interventions. 2. Dyslipidemia. 3. Hypothyroidism.  MEDICATIONS: 1. Darvocet p.r.n. 2. Stool softener daily. 3. Aspirin daily. 4. Toprol XL 25 mg per day. 5. Unithroid 0.15 mg daily. 6. Lipitor 20 mg per day. 7. Niaspan 1000 mg q.h.s.  ALLERGIES:  No known allergies.  SOCIAL HISTORY AND FAMILY HISTORY:  Please see old chart.  PHYSICAL EXAMINATION:  GENERAL:  The patient is a young white male, pale and tachypneic at rest.  VITAL SIGNS: Pulse 122 in sinus tachycardia, respiratory rate 40, blood pressure 110/60 with a pulsus paradoxus of 20 mmHg.  NECK:  Positive JVD at 30 degrees.  He has no bruit.  LUNGS:  Diminished breath sounds in both bases.  CARDIAC:  Reveals a three-component friction rub anterior when supine.  ABDOMEN:  Soft and nontender.  He has no masses or bruits.  SKIN:  is incisions are healing well.  He has no edema.  NEUROLOGIC:  Intact.  LABORATORY DATA:  ECG shows sinus tachycardia with nonspecific ST-T wave changes.  He has a pH of 7.0, PCO2 42, bicarb 26.  Hemoglobin 10.6, white count 14,500. Chemistries remarkable for potassium 6.0; this is a hemolyzed specimen. Glucose 156, BUN 13, creatinine 1.0.  CK 54 with negative MB, troponin 0.04.  An echocardiogram was obtained.  This demonstrates normal left ventricular function.  There is moderately large pericardial effusion located  posterior and lateral to the left ventricle.  There does appear to be some delayed contractility of the posterior wall.  There is respiratory variation of mitral Doppler pattern consistent with tamponade physiology.  IMPRESSION: 1. Dyspnea and tachycardia.  His exam and echocardiogram findings are most    consistent with tamponade with loculated pericardial  effusion post    coronary artery bypass surgery.  Also need to consider possible hematoma.    I think his symptoms and exam are out of proportion to his left lower lobe    atelectasis. 2. Postoperative atelectasis. 3. Status post coronary artery bypass grafting for three-vessel coronary    disease. 4. Dyslipidemia. 5. Hypothyroidism.  PLAN:  Would recommend pericardial drainage by CVTS.  Will need continued pulmonary toilet. Dictated by:   Peter M. [place], M.D. Attending Physician:  Charlett Lango DD:  02/23/01 TD:  02/24/01 Job: [id number]

## 2010-07-17 NOTE — Discharge Summary (Signed)
[place]. [facility]  Patient:    Todd Kelley                          MRN: [id number] Adm. Date:  [id number] Disc. Date: [id number] Attending:  [place], Peter Manning CC:         Maye Hides, M.D.             Manson Passey Summitt                           Discharge Summary  HISTORY OF PRESENT ILLNESS:  Mr. Silveria is a 45 year old white male who is status post non-Q-wave lateral myocardial infarction July of 2000.  He underwent stenting of the proximal intermediate vessels at that time.  He subsequently developed recurrent anginal symptoms and was admitted for cardiac catheterization.  The patient does have a history of hypercholesterolemia.  For details of his past medical history, social history, and family history, and physical exam, please see admission history and physical.  LABORATORY DATA:  Resting ECG was normal.  Chemistry panel was normal. Cholesterol was 248 with a LDL of 148, HDL of 42, triglycerides 292.  Coagulase were normal. CBC was normal.  HOSPITAL COURSE:  The patient was admitted and underwent cardiac catheterization. She demonstrated a restenosis of the intermediate branch up to 99% in the previously stented area.  The remainder of his vessels showed only mild nonobstructive disease.  He had normal left ventricular function.  We proceeded with angioplasty of the restenotic lesion intermediate branch, dilating up with a 3.0 mm balloon to 14 atmospheres.  This yielded excellent angiographic result.  The patient was treated with adjunctive therapy with IV Integrilin and was also given p.o. Plavix.  He had no complications following this procedure.  In addition, he was started on a beta blocker and his Lipitor dose as increased to 20 mg per day.  He was discharged home in stable condition on April 01, 1999.  DISCHARGE DIAGNOSES: 1. Recurrent angina. 2. Hypercholesterolemia. 3. Prior non-Q-wave myocardial infarction in July of  2000. 4. Remote history of tobacco abuse.  DISCHARGE MEDICATIONS: 1. Coated aspirin 325 mg q.d. 2. Toprol XL 50 mg q.d. 3. Plavix 75 mg q.d. for 28 days. 4. Lipitor 20 mg q.d. 5. Nitroglycerin p.r.n.  FOLLOW-UP:  The patient is to follow up with Dr. Peter M. [place] in one to two weeks.  He is okay to return to work on April 06, 1999.  CONDITION ON DISCHARGE:  Discharge status is improved. DD:  04/01/99 TD:  04/01/99 Job: [id number]

## 2010-07-17 NOTE — Discharge Summary (Signed)
[place]. [facility]  Patient:    Todd Kelley, Todd Kelley Visit Number: [id number] MRN: [id number]          Service Type: MED Location: 2000 2039 01 Attending Physician:  Charlett Lango Dictated by:   Dominica Severin, P.A. Admit Date:  02/23/2001 Discharge Date: 03/04/2001   CC:         CVTS  Peter M. [place], M.D.   Discharge Summary  ADDENDUM TO JOB# [id number] This patient was initially anticipated for discharge on March 03, 2001. Nevertheless, the patient was taking Coumadin and his INR was subtherapeutic at the time of discharge.  The patient was kept until March 04, 2001, when his INR was greater than 2.0.  On March 04, 2001, his INR was 2.2 and the patient was discharged home in stable condition with the same medications and instructions per previous discharge summary. Dictated by:   Dominica Severin, P.A. Attending Physician:  Charlett Lango DD:  03/14/01 TD:  03/14/01 Job: [phone]

## 2010-07-17 NOTE — Op Note (Signed)
[place]. [facility]  Patient:    Todd Kelley, Todd Kelley Visit Number: [id number] MRN: [id number]          Service Type: MED Location: 2000 2025 01 Attending Physician:  Cleatrice [name] Dictated by:   Alleen Borne, M.D. Proc. Date: 02/16/01 Admit Date:  02/13/2001   CC:         Peter M. [place], M.D.  [facility]   Operative Report  PREOPERATIVE DIAGNOSIS:  Severe three-vessel coronary artery disease with unstable angina.  POSTOPERATIVE DIAGNOSIS:  Severe three-vessel coronary artery disease with unstable angina.  PROCEDURES:   Median sternotomy, extracorporeal circulation, coronary artery bypass graft surgery x 4 using a left internal mammary artery graft to the left anterior descending coronary artery, with a right internal mammary artery graft to the intermediate coronary, and a sequential saphenous vein graft to the posterior descending and posterolateral branches of the right coronary artery.  SURGEON:  Alleen Borne, M.D.  ASSISTANT:  Rande Brunt. Thomasena Edis, M.D.  ANESTHESIA:  General endotracheal.  CLINICAL HISTORY:  This patient is a 45 year old gentleman with a history of coronary artery disease, status post multiple percutaneous interventions to the intermediate coronary artery.  He presented with a three-week history of recurrent progressive angina.  He underwent repeat cardiac catheterization, which showed severe three-vessel coronary artery disease.  The LAD had 50% proximal stenosis.  There is 95% distal stenosis at the apex.  There was a small first diagonal branch that had 90% proximal stenosis.  The intermediate artery had 95% ostial restenosis of the prior stent site.  There was 50% ostial left circumflex stenosis, but this was a small vessel that remained in the atrioventricular groove.  The right coronary artery was diffusely diseased and ectatic.  There was 70-80% proximal stenosis and a 50%  posterior descending stenosis.  There were faint right to left collaterals to the distal LAD.  Left ventricular function was normal with an ejection fraction of 65%. There was no mitral regurgitation.  After review of the angiogram and examination of the patient, it was felt that coronary artery bypass graft surgery was the best treatment at this time.  I discussed the operative procedure with the patient, including the use of both internal mammary arteries, alternatives, benefits, and risks, including bleeding, blood transfusion, infection, stroke, sternal wound healing problems, and death.  He understood and agreed to proceed.  DESCRIPTION OF PROCEDURE:  The patient was taken to the operating room and placed on the table in the supine position.  After induction of general endotracheal anesthesia, a Foley catheter was placed in the bladder using sterile technique.  Then the chest, abdomen, and both lower extremities were prepped and draped in the usual sterile manner.  The chest was entered through a median sternotomy incision and the pericardium opened in the midline. Examination of the heart showed good ventricular contractility.  The ascending aorta had no palpable plaques in it.  The ascending aorta was relatively short.  Then the left internal mammary artery was harvested from the chest wall as a pedicle graft.  This was a medium-caliber vessel with excellent blood flow through it.  Then the right internal mammary graft was harvested as a pedicle graft.  This was also a medium-caliber vessel with excellent blood flow through it.  At the same time a segment of greater saphenous vein was harvested from the right lower leg, and this vein was of medium size and good quality.  Then the patient  was heparinized and when an adequate activated clotting time was achieved, the distal ascending aorta was cannulated using a 20 French aortic cannula for arterial inflow.  Venous outflow was  achieved using a two-stage venous cannula for the right atrial appendage.  An antegrade cardioplegia and vent cannula was inserted in the aortic root.  The patient was placed on cardiopulmonary bypass and the distal coronary arteries identified.  He had severe, diffuse coronary plaque.  The LAD was a large, graftable vessel.  The diagonal branch was small with no areas that did not have plaque in them to place a graft.  I did not feel that this was a graftable vessel.  The intermediate artery was visible proximally just beyond the stented site and then became intramyocardial.  This was a large, graftable vessel.  The distal left circumflex was never seen on the lateral wall.  The right coronary artery was diffusely diseased, and this extended out into the proximal portions of the posterior descending and posterolateral branches.  Then the aorta was crossclamped and 500 cc of cold blood antegrade cardioplegia was administered in the aortic root with quick arrest of the heart.  Systemic hypothermia to 20 degrees Centigrade and topical hypothermia with iced saline was used.  A temperature probe was placed in the septum and an insulating pad in the pericardium.  The first distal anastomosis was performed to the posterior descending coronary artery.  The internal diameter was 1.6 mm.  The conduit used was a segment of greater saphenous vein and the anastomosis performed in a sequential side-to-side manner using continuous 7-0 Prolene suture.  Flow was measured through the graft and was excellent.  The second distal anastomosis was performed to the posterolateral branch.  The internal diameter was about 1.6 mm.  The conduit used was the same segment of greater saphenous vein and the anastomosis performed in a sequential end-to-side manner using continuous 7-0 Prolene suture.  Flow was measured through the graft and was excellent.  Another dose of cardioplegia was given down the vein graft  and in the aortic root.   The third distal anastomosis was performed to the intermediate coronary artery.  The internal diameter was 1.75 mm.  The conduit used was the right internal mammary graft, and this was brought through an opening in the right pericardium anterior to the phrenic nerve and then through the transverse sinus onto the lateral wall.  It was anastomosed to the intermediate artery in an end-to-side manner using continuous 8-0 Prolene suture.  The anastomosis appeared hemostatic and the pedicle was tacked to the epicardium with 6-0 Prolene sutures.  Then another dose of cardioplegia was given down the vein grafts and in the aortic root.  The fourth distal anastomosis was then performed to the midportion of the left anterior descending coronary artery.  The internal diameter was about 2 mm. The conduit used was the left internal mammary graft, and this was brought through an opening in the left pericardium anterior to the phrenic nerve.  It was anastomosed to the LAD in an end-to-side manner using continuous 8-0 Prolene suture.  The pedicle was tacked to the epicardium with 6-0 Prolene sutures.  The patient was rewarmed to 37 degrees Centigrade and the clamp removed from the mammary pedicle.  There was rapid warming of the ventricular septum and return of spontaneous ventricular fibrillation.  The crossclamp was removed with a time of 88 minutes and the patient spontaneously converted to sinus rhythm.  A partial occlusion clamp was  placed on the aortic root and a single proximal vein graft anastomosis was performed in an end-to-side manner using continuous 6-0 Prolene suture.  The clamp was removed, the vein graft de-aired, and the clamp removed from it.  The proximal and distal anastomoses appeared hemostatic and the alignment of the grafts satisfactory.  A graft marker was placed around the proximal anastomosis.  Two temporary right ventricular and right atrial pacing  wires were placed and brought out through the skin.  When the patient had rewarmed to 37 degrees Centigrade, he was weaned from cardiopulmonary bypass on no inotropic agents.  Total bypass time was 121 minutes.  Cardiac function appeared excellent with a cardiac output of 6 L/min.  Protamine was given, and the venous and aortic cannulas were removed without difficulty.  Hemostasis was achieved.  Four chest tubes were placed, with bilateral pleural tubes, a tube in the anterior mediastinum, and one in the posterior pericardium.  The pericardium was reapproximated over the heart. The sternum was closed with #6 stainless steel wires.  Fascia was closed with a continuous #1 Vicryl suture.  The subcutaneous tissue was closed using continuous 2-0 Vicryl and the skin with 3-0 Vicryl subcuticular closure.  The lower extremity vein harvest site was closed in layers in a similar manner. The sponge, needle, and instrument counts were correct according to the scrub nurse.  Dry sterile dressings were applied over the incisions and around the chest tubes, which were hooked to Pleuravac suction.  The patient remained hemodynamically stable and was transported to the SICU in guarded but stable condition. Dictated by:   Alleen Borne, M.D. Attending Physician:  Cleatrice [name] DD:  02/16/01 TD:  02/17/01 Job: [id number]

## 2010-07-17 NOTE — Discharge Summary (Signed)
[facility]  Patient:    Todd Kelley, Todd Kelley                         MRN: [id number] Adm. Date:  [id number] Disc. Date: [id number] Attending:  Charlton Haws CC:         Elvina Sidle, M.D.             Peter M. [place], M.D.                           Discharge Summary  FINAL DIAGNOSES: 1. Acute appendicitis (final pathology report pending). 2. Coronary artery disease status post coronary stenting. 3. Hypothyroidism.  CLINICAL HISTORY:  The patient is a 45 year old who presented with a very classic history and physical for acute appendicitis.  However, he was afebrile and had  normal white count.  His past medical history is significant for coronary artery disease, but had no current chest symptoms.  In addition, he was recently diagnosed as being hypothyroid and had begun thyroid replacement therapy.  A CT scan was obtained with rectal contrast and confirmed appendicitis.  HOSPITAL COURSE:  The patient was taken to the operating room shortly after the CT was completed and laparoscopic appendectomy performed.  The finding was that of  acute appendicitis.  He tolerated the procedure well.  This was done on the evening of May 9 and, on May 10, he was feeling okay, somewhat improved, and started liquids.  He was advanced and, on May 11, was feeling okay and ambulating in the hall.  He tolerated his diet.  His vital signs were stable and his abdomen was benign.  DISPOSITION: The patient was discharged in satisfactory condition.  He was given Tylox for pain.  He is to resume his usual home medications.  Limited activities. Follow up in my office in two weeks.  LABORATORY DATA:  As noted, white count 9200.  Normal electrolytes.  Creatinine was 1.3.  His T3 uptake was 35.3.  His T4 was 7.0 and his TSH was elevated at 21.4.  Urine was negative. DD:  07/10/99 TD:  07/14/99 Job: [id number]

## 2010-07-17 NOTE — Procedures (Signed)
[place]. [facility]  Patient:    Todd Kelley                          MRN: [id number] Proc. Date: 03/31/99 Adm. Date:  [id number] Attending:  [place], Peter Manning                           Procedure Report  INDICATION FOR PROCEDURE:  The patient is a 45 year old white male with a history of hypercholesterolemia and prior non-Q-wave myocardial infarction in July 2000. He underwent stenting of an intermedius branch at that time in [place], [place]. He now has recurrent anginal symptoms.  ACCESS:  Via the right femoral artery using standard Seldinger technique.  EQUIPMENT:  The 6-French 4-cm right and left Judkins catheter, 6-French pigtail  catheter, 6-French arterial sheath, 7-French arterial sheath, 7-French JL4 guide, and 0.014 high torque floppy extra support wire, a 3.0 x 15-mm Comet VP balloon.  CONTRAST:  Omnipaque, 130 cc.  MEDICATIONS:  Versed 2 mg IV, nitroglycerin 200 mcg intracoronary x 2, Integrilin double bolus followed by infusion, heparin 6000 units IV with subsequent ACT of  290, Plavix 75 mg p.o.  COMMENTARY:  The patient tolerated the procedure well without complications.  HEMODYNAMIC DATA:  Aortic pressure was 124/86 with a mean of 105.  Left ventricular pressure was 121 with and EDP of 22.  ANGIOGRAPHIC DATA: 1. The left coronary artery arises and distributes normally. 2. The left main coronary artery is normal. 3. The left anterior descending artery has some mild calcification proximally.  There are minor irregularities, less than 10-20%. 4. The ramus intermedius branch is a large vessel with a stent visible in the    proximal vessel.  This vessel has a severe 99% stenosis involving the stented    segment extending from the ostium throughout the stented area.  Distally, the    vessel appears normal. 5. The left circumflex coronary is a small vessel that appears normal. 6. The right coronary artery is a large vessel.   It has diffuse ectasia with    scattered 30% lesions.  The left ventricular angiography is performed in the RAO view.  This demonstrates normal left ventricular size and contractility with normal systolic function. Ejection fraction is estimated at 65%.  There is no mitral regurgitation or prolapse.  The aortic valve appears normal.  We proceeded at this point with intervention of the ramus intermedius branch. Using the above equipment, this lesion was crossed and dilated sequentially with the 3.0-mm Comet VP balloon up to 14 atmospheres.  We made sure to cover the ostium of the vessel.  Angiographically, it appears that there is approximately 1 mm or so of the vessel at the ostium that is not covered by stent.  After balloon inflations, an excellent angiographic result was obtained with less than 10% residual stenosis.  The patient tolerated the procedure well.  FINAL INTERPRETATION: 1. Single-vessel obstructive atherosclerotic coronary artery disease with a    restenotic lesion in the ramus intermedius branch. 2. Normal left ventricular function. 3. Successful PTCA of the ramus intermedius. DD:  03/31/99 TD:  03/30/99 Job: [id number]

## 2010-07-17 NOTE — H&P (Signed)
[place]. [facility]  Patient:    Todd Kelley, Todd Kelley Visit Number: [id number] MRN: [id number]          Service Type: Attending:  Salvatore Decent. Dorris Fetch, M.D. Dictated by:   Tollie Pizza Collins, P.A.-C. Adm. Date:  02/23/01   CC:         Peter M. Swaziland, M.D.   History and Physical  CHIEF COMPLAINT: "Trouble breathing."  HISTORY OF PRESENT ILLNESS: The patient is a 45 year old white male, who is status post recent coronary artery bypass grafting x 4 performed by Dr. Laneta Simmers on February 16, 2001.  He progressed well postoperatively and was discharged home on February 21, 2001.  Just prior to his discharge he did complain of some shortness of breath and chest x-ray was performed and was reportedly without acute changes.  Following his discharge home he noted some increasing shortness of breath, which has worsened over the past two days and now is occurring both at rest and with exertion.  He has been markedly worse since about 5 a.m. today.  He finally decided to come into the ER for evaluation at about 11 a.m. today.  He additionally noted some occasional fluttering sensation of his heart, but specifically denies any chest pain aside from incisional discomfort.  He has also not noted any cough, fever, chills, lower extremity edema, nausea, or diaphoresis.  PAST MEDICAL HISTORY:  1. Status post myocardial infarction, first in 2002 with PTCA and stent     placement, and multiple subsequent stents for restenosis.  2. Coronary artery disease, treated with CABG as noted above.  3. Hypothyroidism.  4. Hyperlipidemia.  5. Remote history of GI bleeding secondary to Mallory-Weiss tear.  6. Chronic back pain.  PAST SURGICAL HISTORY:  1. Coronary artery bypass grafting x 4 on February 16, 2001.  (Left     internal mammary artery to the LAD, right internal mammary artery to the     intermediate coronary, sequential saphenous vein graft to posterior     descending artery and  posterolateral artery).  2. Appendectomy.  CURRENT MEDICATIONS:  1. Enteric-coated aspirin 325 mg q.d.  2. Toprol XL 25 mg q.d.  3. Unithroid 0.15 mg q.d.  4. Lipitor 20 mg q.p.m.  5. Niaspan 1000 mg q.p.m.  6. Darvocet-N 100 one to two q.4h p.r.n. pain.  ALLERGIES: No known drug allergies.  SOCIAL HISTORY: The patient is married and lives in [place], [place].  He is employed by [employer].  He reports smoking approximately a pack of cigarettes daily for 20 years.  He quit smoking about two years ago.  He denies any alcohol use.  FAMILY HISTORY: Father had history of coronary artery disease and underwent CABG in his early 85s.  He denies any family history of coronary artery disease, hypertension, diabetes mellitus, cancer, or stroke.  REVIEW OF SYSTEMS: He reports anxiety and a few pounds of weight loss since his surgery.  He has also been weak since surgery.  Other pertinent positives are noted in the History of Present Illness.  He specifically denies any visual changes, headaches, chest pain, lower extremity edema, nausea, vomiting, diarrhea, fever or chills, psychiatric changes, musculoskeletal pain or swelling aside from incisional discomfort, any neurological weakness or TIA symptoms.  PHYSICAL EXAMINATION:  VITAL SIGNS: Blood pressure 121/66, heart rate in the 120s and sinus tachycardia, temperature 99.6 degrees, respirations 30 and shallow.  O2 saturation 96% on 8 liters nonrebreather mask.  GENERAL: This is a well-developed, well-nourished white male  who is in mild respiratory distress.  HEENT: PERRLA.  EOMI.  TMs and canals clear.  External nares patent. Oropharynx clear with moist mucous membranes.  NECK: Supple without lymphadenopathy, thyromegaly, or carotid bruits.  HEART: Sinus tachycardia.  Rate in 120s.  No murmurs.  There is a midline chest incision which is well-healed at this time.  LUNGS: Clear to auscultation, although quite  diminished in the bases bilaterally.  ABDOMEN: Soft, nontender, nondistended.  Active bowel sounds in all quadrants. No hepatosplenomegaly.  EXTREMITIES: Trace edema of right lower extremity.  Saphenectomy incisions well-healed, clean and dry.  Pulses 3+ and symmetric bilaterally in femoral, dorsalis pedis, and posterior tibial arteries.  NEUROLOGIC: Alert and oriented.  No focal neurologic deficits.  LABORATORY DATA: Hemoglobin 12, hematocrit 34.  Sodium 135, potassium 6.0, chloride 105, CO2 27, BUN 13, creatinine 1.0, glucose 156.  ABG pH 7.403, pCO2 41.6.  Chest x-ray shows bilateral pleural effusions, mild atelectasis, and questionable pericardial effusion.  Chest CT is pending.  EKG shows no acute changes.  ASSESSMENT/PLAN: This is a 45 year old white male, status post recent coronary artery bypass grafting, who now presents with increased dyspnea and possible pericardial effusion on chest x-ray.  Chest CT is pending at this time.  Dr. Dorris Fetch has seen and evaluated the patient and will admit him to a step-down unit bed.  We will obtain a 2D echocardiogram and he may need to proceed with pericardial window if this is positive.  He will be kept NPO for now and we will not anticoagulate him yet until echocardiogram is completed in case of the need to go to the operating room. Dictated by:   Tollie Pizza Collins, P.A.-C. Attending:  Salvatore Decent. Dorris Fetch, M.D. DD:  02/23/01 TD:  02/24/01 Job: [id number]

## 2010-07-17 NOTE — Consult Note (Signed)
[place]. [facility]  Patient:    Todd Kelley, Todd Kelley Visit Number: [id number] MRN: [id number]          Service Type: MED Location: 2000 2017 01 Attending Physician:  [place], Peter Manning Dictated by:   Alleen Borne, M.D. Proc. Date: 02/14/01 Admit Date:  02/13/2001   CC:         Peter M. [place], M.D.   Consultation Report  REFERRING PHYSICIAN:  Peter M. [place], M.D.  REASON FOR CONSULTATION:  Severe three vessel coronary artery disease with unstable angina.  HISTORY OF PRESENT ILLNESS:  This patient is a 45 year old gentleman with a history of coronary artery disease status post multiple percutaneous interventions to the intermediate coronary artery who now presents with a three week history of recurrent progressive angina.  He describes this as mid substernal chest pain radiating into his throat and teeth.  It is associated with shortness of breath.  He describes it as being moderately severe.  It has occurred with exertion, but has not occurred at rest.  The pain has been relieved with rest after a few minutes.  He underwent repeat cardiac catheterization yesterday which showed severe three vessel coronary disease. The LAD had 40-50% proximal stenosis and 95% distal stenosis at the apex. There was a small first diagonal at 90% proximal stenosis.  The intermediate had 95% ostial stenosis at the prior stent site.  There was 50% ostial left circumflex stenosis.  The right coronary artery was diffusely diseased and ectatic.  There was 70-80% proximal stenosis and 50% posterior descending stenosis.  There were faint right to left collaterals to the distal LAD.  Left ventricular function was normal with an ejection fraction of 65%.  There was no mitral regurgitation.  PAST MEDICAL HISTORY:  Significant for coronary artery disease.  His initial cardiac event was in July 2000 in [place] where he presented with an acute lateral MI.  He underwent emergent  stenting of the intermediate branch at that time.  He then underwent multiple interventions for restenosis.  His last admission was in October 2002 with recurrent angina at which time the intermediate artery was patent but he developed significant stenosis in the distal LAD as well as moderate stenosis in the diagonal branch.  These were both treated with angioplasty.  He has a history of hypothyroidism, hyperlipidemia, remote GI bleed secondary to a Mallory Weiss tear, prior urinary tract infection.  REVIEW OF SYSTEMS:  CONSTITUTIONAL:  He denies fever or chills.  He has had no recent weight changes.  HEENT:  Eyes:  Negative.  ENT:  Negative.  ENDOCRINE: He denies diabetes and hypothyroidism.  CARDIOVASCULAR:  As above.  He has had no PND or orthopnea.  He denies peripheral edema.  He has had no palpitations. RESPIRATORY:  He denies cough and sputum production.  GASTROINTESTINAL:  He has had no nausea or vomiting.  He denies melena and bright red blood per rectum.  GENITOURINARY:  He denies dysuria and hematuria.  NEUROLOGIC:  He has had no focal weakness or numbness.  He denies dizziness or syncope.  He has never had a TIA or stroke.  MUSCULOSKELETAL:  Negative.  SKIN:  Negative. PSYCHIATRIC:  Negative.  ALLERGIES:  None.  MEDICATIONS: 1. Aspirin one q.d. 2. Toprol XL 100 mg q.d. 3. Unithyroid 0.15 mg q.d. 4. Plavix 75 mg q.d. 5. Lipitor 20 mg q.h.s. 6. Niaspan 1000 mg q.h.s.  SOCIAL HISTORY:  He is married and works for [employer].  He  has four children.  He quit smoking in July 2000.  Denies alcohol use.  FAMILY HISTORY:  Significant for coronary disease.  His father had coronary artery bypass graft surgery in his early 61s and is doing well.  PHYSICAL EXAMINATION  VITAL SIGNS:  Blood pressure 110/90, heart rate 65 and regular, respiratory rate 16 and unlabored.  GENERAL:  He is a well-developed white male in no distress.  HEENT:  Normocephalic and atraumatic.   Pupils are equal, round and reactive to light and accommodation.  Extraocular muscles are intact.  His throat is clear.  NECK:  Normal carotid pulses without bruits.  There is no adenopathy or thyromegaly.  CARDIAC:  Regular rate and rhythm with normal S1 and S2.  There is no murmur, rub, or gallop.  LUNGS:  Clear.  ABDOMEN:  Active bowel sounds.  Soft, nontender.  There are no palpable masses.  No organomegaly.  EXTREMITIES:  No peripheral edema.  Pedal pulses are palpable bilaterally.  NEUROLOGIC:  Alert and oriented x 3.  Motor and sensory examinations are grossly normal.  SKIN:  Warm and dry.  IMPRESSION:  Todd Kelley has severe three vessel coronary disease with unstable anginal symptoms.  He has had multiple episodes of restenosis of the intermediate artery status post percutaneous intervention.  I agree that proceeding with coronary artery bypass graft surgery is the best treatment for the patient.  I discussed the operative procedure with him including alternatives, benefits, and risks including bleeding, blood transfusion, infection, stroke, myocardial infarction, restenosis, and death.  He understands and agrees to proceed with surgery.  We will plan to do this on Thursday, February 16, 2001. Dictated by:   Alleen Borne, M.D. Attending Physician:  [place], Peter Manning DD:  02/14/01 TD:  02/14/01 Job: [phone]

## 2010-09-29 ENCOUNTER — Encounter: Payer: Self-pay | Admitting: Family Medicine

## 2010-09-29 ENCOUNTER — Ambulatory Visit (INDEPENDENT_AMBULATORY_CARE_PROVIDER_SITE_OTHER): Payer: 59 | Admitting: Family Medicine

## 2010-09-29 VITALS — BP 114/80 | HR 65 | Wt 200.0 lb

## 2010-09-29 DIAGNOSIS — D171 Benign lipomatous neoplasm of skin and subcutaneous tissue of trunk: Secondary | ICD-10-CM

## 2010-09-29 DIAGNOSIS — D1779 Benign lipomatous neoplasm of other sites: Secondary | ICD-10-CM

## 2010-09-29 NOTE — Progress Notes (Signed)
  Subjective:    Patient ID: Todd Kelley, male    DOB: Jan 14, 1966, 45 y.o.   MRN: [id number]  HPI He noted a lesion in his right mid back area that hasn't concern. He also set some recent pain in that area made worse with motion. No history of injury  Review of Systems     Objective:   Physical Exam 3 cm round smooth movable lesions noted in the right paravertebral area. The muscles are nontender to palpation.       Assessment & Plan:  Lipoma. Probable musculoskeletal back pain. Supportive care for the back pain. No therapy for the lipoma

## 2010-10-30 ENCOUNTER — Telehealth: Payer: Self-pay | Admitting: Family Medicine

## 2010-10-30 ENCOUNTER — Other Ambulatory Visit: Payer: Self-pay | Admitting: Medical

## 2010-10-30 MED ORDER — LISINOPRIL 10 MG PO TABS: 10.0000 mg | ORAL_TABLET | Freq: Every day | ORAL | Status: DC

## 2010-11-24 NOTE — Telephone Encounter (Signed)
DONE

## 2011-02-12 ENCOUNTER — Other Ambulatory Visit: Payer: Self-pay | Admitting: *Deleted

## 2011-02-12 MED ORDER — LEVOTHYROXINE SODIUM 175 MCG PO TABS: 175.0000 ug | ORAL_TABLET | Freq: Every day | ORAL | Status: DC

## 2011-02-17 ENCOUNTER — Encounter: Payer: Self-pay | Admitting: *Deleted

## 2011-02-17 ENCOUNTER — Ambulatory Visit: Payer: 59 | Admitting: Cardiology

## 2011-02-17 DIAGNOSIS — I219 Acute myocardial infarction, unspecified: Secondary | ICD-10-CM | POA: Insufficient documentation

## 2011-02-17 DIAGNOSIS — R002 Palpitations: Secondary | ICD-10-CM | POA: Insufficient documentation

## 2011-02-17 DIAGNOSIS — E785 Hyperlipidemia, unspecified: Secondary | ICD-10-CM | POA: Insufficient documentation

## 2011-02-17 DIAGNOSIS — K219 Gastro-esophageal reflux disease without esophagitis: Secondary | ICD-10-CM | POA: Insufficient documentation

## 2011-03-24 ENCOUNTER — Telehealth: Payer: Self-pay | Admitting: Internal Medicine

## 2011-03-25 ENCOUNTER — Other Ambulatory Visit: Payer: Self-pay

## 2011-03-25 MED ORDER — FENOFIBRATE 145 MG PO TABS: 145.0000 mg | ORAL_TABLET | Freq: Every day | ORAL | Status: DC

## 2011-03-25 NOTE — Telephone Encounter (Signed)
I have taken care of this

## 2011-03-31 ENCOUNTER — Encounter: Payer: Self-pay | Admitting: Gastroenterology

## 2011-03-31 ENCOUNTER — Ambulatory Visit (INDEPENDENT_AMBULATORY_CARE_PROVIDER_SITE_OTHER): Payer: 59 | Admitting: Cardiology

## 2011-03-31 ENCOUNTER — Encounter: Payer: Self-pay | Admitting: Cardiology

## 2011-03-31 VITALS — BP 126/88 | HR 60 | Ht 69.0 in | Wt 208.6 lb

## 2011-03-31 DIAGNOSIS — E785 Hyperlipidemia, unspecified: Secondary | ICD-10-CM

## 2011-03-31 DIAGNOSIS — Z951 Presence of aortocoronary bypass graft: Secondary | ICD-10-CM | POA: Insufficient documentation

## 2011-03-31 DIAGNOSIS — R1310 Dysphagia, unspecified: Secondary | ICD-10-CM | POA: Insufficient documentation

## 2011-03-31 DIAGNOSIS — I2510 Atherosclerotic heart disease of native coronary artery without angina pectoris: Secondary | ICD-10-CM

## 2011-03-31 MED ORDER — ATORVASTATIN CALCIUM 10 MG PO TABS: 10.0000 mg | ORAL_TABLET | Freq: Every day | ORAL | Status: DC

## 2011-03-31 MED ORDER — EZETIMIBE 10 MG PO TABS: 10.0000 mg | ORAL_TABLET | Freq: Every day | ORAL | Status: DC

## 2011-03-31 MED ORDER — ALPRAZOLAM 0.25 MG PO TABS: 0.2500 mg | ORAL_TABLET | Freq: Every evening | ORAL | Status: DC | PRN

## 2011-03-31 NOTE — Assessment & Plan Note (Signed)
At the patient's request we will refer him to gastroenterology for further evaluation.

## 2011-03-31 NOTE — Progress Notes (Signed)
[name] Date of Birth: 28-Jan-1966 Medical Record #[id number]  History of Present Illness: Todd Kelley is seen today for yearly followup. He has a history of coronary disease. He presented in July of 2000 with a lateral myocardial infarction. He had stenting of an intermediate branch at that time. He had angioplasty for restenosis in 2001x2. 2002 he again had intervention with brachy therapy. He later had angioplasty of the distal LAD. December 2002 he had recurrent angina and recurrent stenoses. He underwent bypass surgery with an LIMA graft to the LAD, RIMA graft to the intermediate, and saphenous vein graft to the PDA and posterior lateral branches. Postoperative course was complicated by tamponade requiring a pericardial window. He also developed a DVT. His last stress test in September of 2011 showed a fixed basal to mid lateral wall defect consistent with infarct. Ejection fraction was 54%. He has done great well from a cardiac standpoint this year. He does report that he has been under extreme stress this year. He is now legally separated from his wife. Occasionally he develops chest pressure or smothering sensation when he is extremely anxious. He was on high-dose Crestor but was unable to tolerate this due to hip pain. He has been off of cholesterol medicine since November.  Current Outpatient Prescriptions on File Prior to Visit  Medication Sig Dispense Refill  . fenofibrate (TRICOR) 145 MG tablet Take 1 tablet (145 mg total) by mouth daily.  30 tablet  2  . levothyroxine (SYNTHROID, LEVOTHROID) 175 MCG tablet Take 1 tablet (175 mcg total) by mouth daily.  30 tablet  1  . lisinopril (PRINIVIL,ZESTRIL) 10 MG tablet Take 1 tablet (10 mg total) by mouth daily.  30 tablet  5    Allergies  Allergen Reactions  . Tetracycline     REACTION: Nervous,shakey    Past Medical History  Diagnosis Date  . Coronary artery disease   . Dyslipidemia   . Thyroid disease     hypothyroidism  . MI  (myocardial infarction) july of 2000    stent  . Acid reflux   . Palpitations     hx of    Past Surgical History  Procedure Date  . Carotid endarterectomy   . Cardiac catheterization 2000    stent  . Coronary angioplasty jan. 2001  . Coronary angioplasty with stent placement june 2001    stent  . Coronary artery bypass graft dec. 2002    LIMA graft to LAD,RIMA graft to the intermediate branch and saphenous vein graft  to the posterior lateral and posterior descending branches of the right coronary  . Appendectomy 2001    History  Smoking status  . Former Smoker  Smokeless tobacco  . Never Used    History  Alcohol Use: Not on file    Family History  Problem Relation Age of Onset  . Heart disease Father     cabg     Review of Systems: The review of systems is positive for dysphasia. He apparently had a Mallory-Weiss tear at the time of his heart attack in the past. All other systems were reviewed and are negative.  Physical Exam: BP 126/88  Pulse 60  Ht 5\' 9"  (1.753 m)  Wt 208 lb 9.6 oz (94.62 kg)  BMI 30.80 kg/m2 He is a well-developed white male in no acute distress.The patient is alert and oriented x 3.  The mood and affect are normal.  The skin is warm and dry.  Color is normal.  The  HEENT exam reveals that the sclera are nonicteric.  The mucous membranes are moist.  The carotids are 2+ without bruits.  There is no thyromegaly.  There is no JVD.  The lungs are clear.  The chest wall is non tender.  The heart exam reveals a regular rate with a normal S1 and S2.  There are no murmurs, gallops, or rubs.  The PMI is not displaced.   Abdominal exam reveals good bowel sounds.  There is no guarding or rebound.  There is no hepatosplenomegaly or tenderness.  There are no masses.  Exam of the legs reveal no clubbing, cyanosis, or edema.  The legs are without rashes.  The distal pulses are intact.  Cranial nerves II - XII are intact.  Motor and sensory functions are intact.  The  gait is normal.  LABORATORY DATA:  ECG demonstrates normal sinus rhythm with left anterior fascicular block. It is otherwise normal. Assessment / Plan:

## 2011-03-31 NOTE — Assessment & Plan Note (Signed)
He has been intolerant of high-dose statins in the past. I recommended a lower dose of Lipitor 10 mg daily with Zetia 10 mg daily. We will repeat blood work in 3 months.

## 2011-03-31 NOTE — Assessment & Plan Note (Signed)
Cardiac history as noted in history of present illness. He is really asymptomatic at this point. We need to focus on continued risk factor modification. His last nuclear stress test in September of 2011 was stable showing evidence of an old lateral infarct.

## 2011-03-31 NOTE — Patient Instructions (Signed)
We will start you back on Zetia 10 mg and lipitor 10 mg daily.  We will schedule you for evaluation with gastroenterology.  We will check fasting lab work in 3 months.  I prescribed Xanax .25 mg prn anxiety.

## 2011-04-12 ENCOUNTER — Encounter: Payer: Self-pay | Admitting: Gastroenterology

## 2011-04-12 ENCOUNTER — Ambulatory Visit (INDEPENDENT_AMBULATORY_CARE_PROVIDER_SITE_OTHER): Payer: 59 | Admitting: Gastroenterology

## 2011-04-12 DIAGNOSIS — R1310 Dysphagia, unspecified: Secondary | ICD-10-CM

## 2011-04-12 NOTE — Progress Notes (Signed)
HPI: This is a   very pleasant 46 year old man whom I am meeting for the first time today.  Has feeling of food hanging in eosphagus, low retrosternum.  Intermittent.  Occurs 2 times a week. Drinks some liquid and the food goes through. Has not had to vomit to dislodge it.    Pills never get stuck.    Overall stable weight for psat 1.5 years.  Very rare pyrosis.  Takes tums 2-4 times a year.  If he eats a late meal, will take some tums.  If he does not then he gets acid taste into mouth.  He used to  have pretty severe pyrosis, he was on Nexium for a year or 2. After losing 30-40 pounds a couple years ago his GERD significantly improved  Review of systems: Pertinent positive and negative review of systems were noted in the above HPI section. Complete review of systems was performed and was otherwise normal.    Past Medical History  Diagnosis Date  . Coronary artery disease   . Dyslipidemia   . Thyroid disease     hypothyroidism  . MI (myocardial infarction) july of 2000    stent  . Acid reflux   . Palpitations     hx of    Past Surgical History  Procedure Date  . Carotid endarterectomy   . Cardiac catheterization 2000    stent  . Coronary angioplasty jan. 2001  . Coronary angioplasty with stent placement june 2001    stent  . Coronary artery bypass graft dec. 2002    LIMA graft to LAD,RIMA graft to the intermediate branch and saphenous vein graft  to the posterior lateral and posterior descending branches of the right coronary  . Appendectomy 2001    Current Outpatient Prescriptions  Medication Sig Dispense Refill  . ALPRAZolam (XANAX) 0.25 MG tablet Take 1 tablet (0.25 mg total) by mouth at bedtime as needed for sleep.  30 tablet  0  . aspirin 325 MG tablet Take 325 mg by mouth daily.      Marland Kitchen atorvastatin (LIPITOR) 10 MG tablet Take 1 tablet (10 mg total) by mouth daily.  30 tablet  11  . ezetimibe (ZETIA) 10 MG tablet Take 1 tablet (10 mg total) by mouth daily.  30  tablet  11  . fenofibrate (TRICOR) 145 MG tablet Take 1 tablet (145 mg total) by mouth daily.  30 tablet  2  . levothyroxine (SYNTHROID, LEVOTHROID) 175 MCG tablet Take 1 tablet (175 mcg total) by mouth daily.  30 tablet  1  . lisinopril (PRINIVIL,ZESTRIL) 10 MG tablet Take 1 tablet (10 mg total) by mouth daily.  30 tablet  5  . tadalafil (CIALIS) 5 MG tablet Take 5 mg by mouth daily as needed.        Allergies as of 04/12/2011 - Review Complete 04/12/2011  Allergen Reaction Noted  . Tetracycline      Family History  Problem Relation Age of Onset  . Heart disease Father     cabg   . Liver cancer Mother     History   Social History  . Marital Status: Legally Separated    Spouse Name: N/A    Number of Children: 3  . Years of Education: N/A   Occupational History  . [employer]    Social History Main Topics  . Smoking status: Former Games developer  . Smokeless tobacco: Never Used  . Alcohol Use: Yes     occ.  . Drug Use:  No  . Sexually Active: Not on file   Other Topics Concern  . Not on file   Social History Narrative  . No narrative on file       Physical Exam: BP 154/90  Pulse 68  Ht 5\' 9"  (1.753 m)  Wt 208 lb 12.8 oz (94.711 kg)  BMI 30.83 kg/m2 Constitutional: generally well-appearing Psychiatric: alert and oriented x3 Eyes: extraocular movements intact Mouth: oral pharynx moist, no lesions Neck: supple no lymphadenopathy Cardiovascular: heart regular rate and rhythm Lungs: clear to auscultation bilaterally Abdomen: soft, nontender, nondistended, no obvious ascites, no peritoneal signs, normal bowel sounds Extremities: no lower extremity edema bilaterally Skin: no lesions on visible extremities    Assessment and plan: 46 y.o. male with   intermittent GERD, intermittent dysphasia, nonprogressive, solid food only  I suspect GERD is playing at least somewhat of a role in his dysphagia symptoms. He is going to try proton pump inhibitors once daily  for the next 2-3 weeks and we will proceed with EGD after that trial. I see no reason for any further blood tests or imaging studies prior to that

## 2011-04-12 NOTE — Patient Instructions (Signed)
Trial of prilosec, prevacid, or their generic equivalent.  One pill once daily, shortly before breakfast meal. You will be set up for an upper endoscopy 2-3 week from now.

## 2011-04-13 ENCOUNTER — Encounter: Payer: Self-pay | Admitting: Gastroenterology

## 2011-04-29 ENCOUNTER — Telehealth: Payer: Self-pay | Admitting: Internal Medicine

## 2011-04-29 ENCOUNTER — Other Ambulatory Visit: Payer: Self-pay | Admitting: Cardiology

## 2011-04-29 MED ORDER — LISINOPRIL 10 MG PO TABS: 10.0000 mg | ORAL_TABLET | Freq: Every day | ORAL | Status: DC

## 2011-04-29 MED ORDER — LEVOTHYROXINE SODIUM 175 MCG PO TABS: 175.0000 ug | ORAL_TABLET | Freq: Every day | ORAL | Status: DC

## 2011-04-29 NOTE — Telephone Encounter (Signed)
Lisinopril renewed.

## 2011-04-30 ENCOUNTER — Encounter: Payer: Self-pay | Admitting: Gastroenterology

## 2011-04-30 ENCOUNTER — Ambulatory Visit (AMBULATORY_SURGERY_CENTER): Payer: 59 | Admitting: Gastroenterology

## 2011-04-30 VITALS — BP 131/82 | HR 57 | Temp 97.5°F | Resp 18 | Ht 69.0 in | Wt 208.0 lb

## 2011-04-30 DIAGNOSIS — K2970 Gastritis, unspecified, without bleeding: Secondary | ICD-10-CM

## 2011-04-30 DIAGNOSIS — Q391 Atresia of esophagus with tracheo-esophageal fistula: Secondary | ICD-10-CM

## 2011-04-30 DIAGNOSIS — R1310 Dysphagia, unspecified: Secondary | ICD-10-CM

## 2011-04-30 DIAGNOSIS — K2960 Other gastritis without bleeding: Secondary | ICD-10-CM

## 2011-04-30 DIAGNOSIS — K222 Esophageal obstruction: Secondary | ICD-10-CM

## 2011-04-30 MED ORDER — SODIUM CHLORIDE 0.9 % IV SOLN: 500.0000 mL | INTRAVENOUS | Status: DC

## 2011-04-30 NOTE — Op Note (Signed)
 [facility] [address] [place], [place]  [address]  ENDOSCOPY PROCEDURE REPORT  PATIENT:  Todd, Kelley  MR#:  [id number] BIRTHDATE:  18-Dec-1965, 45 yrs. old  GENDER:  male ENDOSCOPIST:  Rachael Fee, MD Referred by:  Sharlot Gowda, M.D. PROCEDURE DATE:  04/30/2011 PROCEDURE:  EGD with biopsy, [id number], EGD with balloon dilatation ASA CLASS:  Class II INDICATIONS:  intermittent dysphagia MEDICATIONS:  Fentanyl 50 mcg IV, These medications were titrated to patient response per physician's verbal order, Versed 4 mg IV TOPICAL ANESTHETIC:  Cetacaine Spray DESCRIPTION OF PROCEDURE:   After the risks benefits and alternatives of the procedure were thoroughly explained, informed consent was obtained.  The  endoscope was introduced through the mouth and advanced to the second portion of the duodenum, without limitations.  The instrument was slowly withdrawn as the mucosa was fully examined. <<PROCEDUREIMAGES>>  There was moderate, non-specific gastritis. Biopsies taken and sent to pathology (jar 1) (see image7).  A Schatzki's ring was found. This was dilated with 20mm CRE TTS balloon held inflated for 1 minute. There was no bleeding after dilation (see image6 and image8).  Otherwise the examination was normal (see image1 and image3).    Retroflexed views revealed no abnormalities.    The scope was then withdrawn from the patient and the procedure completed.  COMPLICATIONS:  None  ENDOSCOPIC IMPRESSION: 1. Moderate gastritis, biopsied to check for H. pylori 2. Schatzki's ring, dilated to 20mm  RECOMMENDATIONS: Stop the prilosec, it doesn't seem to have helped. If biopsies show H. pylori, you will be started on the appropriate antibiotics. If swallowing trouble return, repeat EGD and dilation can be performed.  ______________________________ Rachael Fee, MD  n. eSIGNED:   Rachael Fee at 04/30/2011 02:19 PM  Clement Husbands, [phone]

## 2011-04-30 NOTE — Patient Instructions (Signed)
Discharge instructions given with verbal understanding. Handouts on gastritis and a dilatation diet given. Resume previous medications. YOU HAD AN ENDOSCOPIC PROCEDURE TODAY AT THE  ENDOSCOPY CENTER: Refer to the procedure report that was given to you for any specific questions about what was found during the examination.  If the procedure report does not answer your questions, please call your gastroenterologist to clarify.  If you requested that your care partner not be given the details of your procedure findings, then the procedure report has been included in a sealed envelope for you to review at your convenience later.  YOU SHOULD EXPECT: Some feelings of bloating in the abdomen. Passage of more gas than usual.  Walking can help get rid of the air that was put into your GI tract during the procedure and reduce the bloating. If you had a lower endoscopy (such as a colonoscopy or flexible sigmoidoscopy) you may notice spotting of blood in your stool or on the toilet paper. If you underwent a bowel prep for your procedure, then you may not have a normal bowel movement for a few days.  DIET: Your first meal following the procedure should be a light meal and then it is ok to progress to your normal diet.  A half-sandwich or bowl of soup is an example of a good first meal.  Heavy or fried foods are harder to digest and may make you feel nauseous or bloated.  Likewise meals heavy in dairy and vegetables can cause extra gas to form and this can also increase the bloating.  Drink plenty of fluids but you should avoid alcoholic beverages for 24 hours.  ACTIVITY: Your care partner should take you home directly after the procedure.  You should plan to take it easy, moving slowly for the rest of the day.  You can resume normal activity the day after the procedure however you should NOT DRIVE or use heavy machinery for 24 hours (because of the sedation medicines used during the test).    SYMPTOMS TO REPORT  IMMEDIATELY: A gastroenterologist can be reached at any hour.  During normal business hours, 8:30 AM to 5:00 PM Monday through Friday, call [phone].  After hours and on weekends, please call the GI answering service at [phone] who will take a message and have the physician on call contact you.   Following upper endoscopy (EGD)  Vomiting of blood or coffee ground material  New chest pain or pain under the shoulder blades  Painful or persistently difficult swallowing  New shortness of breath  Fever of 100F or higher  Black, tarry-looking stools  FOLLOW UP: If any biopsies were taken you will be contacted by phone or by letter within the next 1-3 weeks.  Call your gastroenterologist if you have not heard about the biopsies in 3 weeks.  Our staff will call the home number listed on your records the next business day following your procedure to check on you and address any questions or concerns that you may have at that time regarding the information given to you following your procedure. This is a courtesy call and so if there is no answer at the home number and we have not heard from you through the emergency physician on call, we will assume that you have returned to your regular daily activities without incident.  SIGNATURES/CONFIDENTIALITY: You and/or your care partner have signed paperwork which will be entered into your electronic medical record.  These signatures attest to the fact that that   the information above on your After Visit Summary has been reviewed and is understood.  Full responsibility of the confidentiality of this discharge information lies with you and/or your care-partner. 

## 2011-05-03 ENCOUNTER — Telehealth: Payer: Self-pay

## 2011-05-03 NOTE — Telephone Encounter (Signed)
  Follow up Call-  Call back number 04/30/2011  Post procedure Call Back phone  # [phone]  Permission to leave phone message Yes     Patient questions:  Do you have a fever, pain , or abdominal swelling? no Pain Score  0 *  Have you tolerated food without any problems? yes  Have you been able to return to your normal activities? yes  Do you have any questions about your discharge instructions: Diet   no Medications  no Follow up visit  no  Do you have questions or concerns about your Care? no  Actions: * If pain score is 4 or above: No action needed, pain <4.

## 2011-05-06 ENCOUNTER — Encounter: Payer: Self-pay | Admitting: Gastroenterology

## 2011-05-13 ENCOUNTER — Encounter: Payer: Self-pay | Admitting: Internal Medicine

## 2011-05-13 ENCOUNTER — Ambulatory Visit (INDEPENDENT_AMBULATORY_CARE_PROVIDER_SITE_OTHER): Payer: 59 | Admitting: Medical

## 2011-05-13 ENCOUNTER — Encounter: Payer: Self-pay | Admitting: Medical

## 2011-05-13 VITALS — BP 128/88 | HR 76 | Temp 98.5°F | Wt 209.0 lb

## 2011-05-13 DIAGNOSIS — F17200 Nicotine dependence, unspecified, uncomplicated: Secondary | ICD-10-CM

## 2011-05-13 DIAGNOSIS — J329 Chronic sinusitis, unspecified: Secondary | ICD-10-CM

## 2011-05-13 DIAGNOSIS — J40 Bronchitis, not specified as acute or chronic: Secondary | ICD-10-CM

## 2011-05-13 MED ORDER — AMOXICILLIN-POT CLAVULANATE 875-125 MG PO TABS: 1.0000 | ORAL_TABLET | Freq: Two times a day (BID) | ORAL | Status: AC

## 2011-05-13 MED ORDER — HYDROCODONE-HOMATROPINE 5-1.5 MG/5ML PO SYRP: 5.0000 mL | ORAL_SOLUTION | Freq: Three times a day (TID) | ORAL | Status: AC | PRN

## 2011-05-13 NOTE — Progress Notes (Signed)
Subjective:  Todd Kelley is a 46 y.o. male who presents for congestion.   Started with a cold the week before last, but now getting productive sputum.  In the mornings blowing nothing but green mucous from nose.  He reports sore throat, neck discomfort, sinus pressure.  Denies fever, nausea, vomiting.  Several sick contacts.  Using Theraflu, Tussionex.  He is a smoker.  He notes having an episode of bronchitis in the past.  He usually gets about 1 sinusitis yearly at the end of allergy season.  No other aggravating or relieving factors.  No other c/o.  The following portions of the patient's history were reviewed and updated as appropriate: allergies, current medications, past family history, past medical history, past social history, past surgical history and problem list.  Past Medical History  Diagnosis Date  . Coronary artery disease   . Dyslipidemia   . Thyroid disease     hypothyroidism  . MI (myocardial infarction) july of 2000    stent  . Acid reflux   . Palpitations     hx of   ROS as noted in HPI   Objective:   Filed Vitals:   05/13/11 1331  BP: 128/88  Pulse: 76  Temp: 98.5 F (36.9 C)    General appearance: Alert, WD/WN, no distress,somewhat ill appearing                             Skin: warm, no rash, no diaphoresis                           Head: no sinus tenderness                            Eyes: conjunctiva normal, corneas clear, PERRLA                            Ears: pearly TMs, external ear canals normal                          Nose: septum midline, turbinates swollen, nares with erythema and very inflamed appearing             Mouth/throat: MMM, tongue normal, mild pharyngeal erythema                           Neck: supple, no adenopathy, no thyromegaly, nontender                          Heart: RRR, normal S1, S2, no murmurs                         Lungs: +bronchial breath sounds, no rhonchi, no wheezes, no rales                Extremities: no edema,  nontender     Assessment and Plan:   Encounter Diagnoses  Name Primary?  . Sinobronchitis Yes  . Tobacco use disorder    Sinobronchitis - Prescription given today for Augmentin and Hycodan as below.  Discussed diagnosis and treatment of bronchitis.  Suggested symptomatic OTC remedies for cough and congestion - Coricidin HBP.  Tylenol OTC for fever and malaise.  Call/return in  2-3 days if symptoms are worse or not improving.    Tobacco use disorder - discussed risks of tobacco and recommended he stop smoking.

## 2011-05-13 NOTE — Patient Instructions (Signed)
Acute Bronchitis Bronchitis is a problem of the air tubes leading to your lungs. Acute means the illness started quickly. In this condition, the lining of those tubes becomes puffy (swollen) and can leak fluid. This makes it harder for air to get in and out of your lungs. You may cough a lot. This is because the air tubes are narrow. Bronchitis is most often caused by a virus. Medicines that kill germs (antibiotics) may be needed with germ (bacteria) infections for people who:  Smoke.   Have lasting (chronic) lung problems.   Are elderly.  HOME CARE  Rest.   Drink enough water and fluids to keep the pee clear or pale yellow.   Only take medicine as told by your doctor.   Medicines may be prescribed that will open up the airways. This will help make breathing easier.   Bronchitis usually gets better on its own in a few days.  Recovery from some problems (symptoms) of bronchitis may be slow. You should start feeling a little better after 2 to 3 days. Coughing may last for 3 to 4 weeks. GET HELP RIGHT AWAY IF:  You or your child has a temperature by mouth above 101, not controlled by medicine.   Chills or chest pain develops.   You or your child develops very bad shortness of breath.   There is bloody saliva mixed with mucus (sputum).   You or your child throws up (vomits) often, loses too much fluid (dehydration), feels faint, or has a very bad headache.   You or your child does not improve after 1 week of treatment.  MAKE SURE YOU:   Understand these instructions.   Will watch this condition.   Will get help right away if you or your child is not doing well or gets worse.  Document Released: 08/04/2007 Document Re-Released: 05/12/2009 [employer] Patient Information 2011 [employer].  

## 2011-05-31 ENCOUNTER — Other Ambulatory Visit (INDEPENDENT_AMBULATORY_CARE_PROVIDER_SITE_OTHER): Payer: 59

## 2011-05-31 DIAGNOSIS — Z951 Presence of aortocoronary bypass graft: Secondary | ICD-10-CM

## 2011-05-31 DIAGNOSIS — I2510 Atherosclerotic heart disease of native coronary artery without angina pectoris: Secondary | ICD-10-CM

## 2011-05-31 DIAGNOSIS — E785 Hyperlipidemia, unspecified: Secondary | ICD-10-CM

## 2011-05-31 LAB — LIPID PANEL
Cholesterol: 113 mg/dL (ref 0–200)
VLDL: 17.2 mg/dL (ref 0.0–40.0)

## 2011-05-31 LAB — BASIC METABOLIC PANEL
BUN: 12 mg/dL (ref 6–23)
Calcium: 8.9 mg/dL (ref 8.4–10.5)
Creatinine, Ser: 1.3 mg/dL (ref 0.4–1.5)
GFR: 63.87 mL/min (ref >60.00)
Potassium: 3.5 mEq/L (ref 3.5–5.1)

## 2011-05-31 LAB — HEPATIC FUNCTION PANEL
ALT: 24 U/L (ref 0–53)
AST: 18 U/L (ref 0–37)
Alkaline Phosphatase: 36 U/L — ABNORMAL LOW (ref 39–117)
Bilirubin, Direct: 0.1 mg/dL (ref 0.0–0.3)
Total Protein: 6.5 g/dL (ref 6.0–8.3)

## 2011-07-07 ENCOUNTER — Telehealth: Payer: Self-pay | Admitting: Family Medicine

## 2011-07-07 NOTE — Telephone Encounter (Signed)
rcd fax req for Fenofibrate 145 mg   #30   [facility]

## 2011-07-08 ENCOUNTER — Other Ambulatory Visit: Payer: Self-pay

## 2011-07-08 MED ORDER — FENOFIBRATE 145 MG PO TABS: 145.0000 mg | ORAL_TABLET | Freq: Every day | ORAL | Status: DC

## 2011-07-08 NOTE — Telephone Encounter (Signed)
Med sent in.

## 2011-07-08 NOTE — Telephone Encounter (Signed)
Med had already been ordered

## 2011-12-07 ENCOUNTER — Other Ambulatory Visit: Payer: Self-pay

## 2011-12-07 MED ORDER — LEVOTHYROXINE SODIUM 175 MCG PO TABS: 175.0000 ug | ORAL_TABLET | Freq: Every day | ORAL | Status: DC

## 2012-02-07 ENCOUNTER — Other Ambulatory Visit: Payer: Self-pay | Admitting: Medical

## 2012-05-04 ENCOUNTER — Other Ambulatory Visit: Payer: Self-pay

## 2012-05-04 DIAGNOSIS — E785 Hyperlipidemia, unspecified: Secondary | ICD-10-CM

## 2012-05-04 MED ORDER — EZETIMIBE 10 MG PO TABS: 10.0000 mg | ORAL_TABLET | Freq: Every day | ORAL | Status: DC

## 2012-05-04 MED ORDER — ATORVASTATIN CALCIUM 10 MG PO TABS: 10.0000 mg | ORAL_TABLET | Freq: Every day | ORAL | Status: DC

## 2012-05-26 ENCOUNTER — Other Ambulatory Visit: Payer: Self-pay

## 2012-05-30 ENCOUNTER — Other Ambulatory Visit: Payer: Self-pay | Admitting: Medical

## 2012-06-01 ENCOUNTER — Telehealth: Payer: Self-pay

## 2012-06-01 NOTE — Telephone Encounter (Signed)
PHAR iREQUESTING A REFILL FOR PT TRICOR PT USED TO SEE DR Aleen Campi BUT HE KNOW LONGER SEES HIM-sHOULD WE GIVE PT # 30 NO REFILLS AND HAVE HIM FOLLOW UP WITH NEW PCP DOCTOR ?

## 2012-06-02 MED ORDER — FENOFIBRATE 145 MG PO TABS: 145.0000 mg | ORAL_TABLET | Freq: Every day | ORAL | Status: DC

## 2012-06-02 NOTE — Telephone Encounter (Signed)
Spoke to patient's wife tricor refill sent to pharmacy.Patient past due for appointment with Dr.Jordan.Appointment scheduled with Dr.Jordan 07/17/12.

## 2012-06-27 ENCOUNTER — Other Ambulatory Visit: Payer: Self-pay

## 2012-06-27 DIAGNOSIS — E785 Hyperlipidemia, unspecified: Secondary | ICD-10-CM

## 2012-06-27 MED ORDER — ATORVASTATIN CALCIUM 10 MG PO TABS: 10.0000 mg | ORAL_TABLET | Freq: Every day | ORAL | Status: DC

## 2012-07-14 ENCOUNTER — Other Ambulatory Visit: Payer: Self-pay | Admitting: Cardiology

## 2012-07-17 ENCOUNTER — Ambulatory Visit (INDEPENDENT_AMBULATORY_CARE_PROVIDER_SITE_OTHER): Payer: BC Managed Care – PPO | Admitting: Cardiology

## 2012-07-17 ENCOUNTER — Encounter: Payer: Self-pay | Admitting: Cardiology

## 2012-07-17 VITALS — BP 160/92 | HR 66 | Ht 69.0 in | Wt 222.8 lb

## 2012-07-17 DIAGNOSIS — Z720 Tobacco use: Secondary | ICD-10-CM | POA: Insufficient documentation

## 2012-07-17 DIAGNOSIS — I2510 Atherosclerotic heart disease of native coronary artery without angina pectoris: Secondary | ICD-10-CM

## 2012-07-17 DIAGNOSIS — F17200 Nicotine dependence, unspecified, uncomplicated: Secondary | ICD-10-CM

## 2012-07-17 DIAGNOSIS — E785 Hyperlipidemia, unspecified: Secondary | ICD-10-CM

## 2012-07-17 DIAGNOSIS — Z951 Presence of aortocoronary bypass graft: Secondary | ICD-10-CM

## 2012-07-17 MED ORDER — LEVOTHYROXINE SODIUM 175 MCG PO TABS: 175.0000 ug | ORAL_TABLET | Freq: Every day | ORAL | Status: DC

## 2012-07-17 MED ORDER — EZETIMIBE 10 MG PO TABS: 10.0000 mg | ORAL_TABLET | Freq: Every day | ORAL | Status: DC

## 2012-07-17 MED ORDER — ATORVASTATIN CALCIUM 10 MG PO TABS: 10.0000 mg | ORAL_TABLET | Freq: Every day | ORAL | Status: DC

## 2012-07-17 MED ORDER — LISINOPRIL 10 MG PO TABS: 10.0000 mg | ORAL_TABLET | Freq: Every day | ORAL | Status: DC

## 2012-07-17 MED ORDER — FENOFIBRATE 145 MG PO TABS: 145.0000 mg | ORAL_TABLET | Freq: Every day | ORAL | Status: DC

## 2012-07-17 NOTE — Patient Instructions (Addendum)
You need to stop smoking!!!  Start eating a heart healthy diet.  You need to exercise regularly-- this will help reduce stress.  We will schedule you for fasting lab work.  I will see you in 6 months.

## 2012-07-17 NOTE — Progress Notes (Signed)
Todd Kelley Date of Birth: 07/30/1965 Medical Record #[id number]  History of Present Illness: Todd Kelley is seen today for yearly followup. He has a history of coronary disease. He presented in July of 2000 with a lateral myocardial infarction. He had stenting of an intermediate branch at that time. He had angioplasty for restenosis in 2001x2. 2002 he again had intervention with brachy therapy. He later had angioplasty of the distal LAD. December 2002 he had recurrent angina and recurrent stenoses. He underwent bypass surgery with an LIMA graft to the LAD, RIMA graft to the intermediate, and saphenous vein graft to the PDA and posterior lateral branches. Postoperative course was complicated by tamponade requiring a pericardial window. He also developed a DVT. His last stress test in September of 2011 showed a fixed basal to mid lateral wall defect consistent with infarct. Ejection fraction was 54%.  On followup today he reports that he has been under a great deal of stress this past year his. He is separated from his wife. He admits that he has been eating poorly and has gained 14 pounds. He has resumed smoking. He is not exercising. He has not been taking his lisinopril. He does note that if he drinks a lot of sodas his legs well. He denies any chest pain or shortness of breath.  Current Outpatient Prescriptions on File Prior to Visit  Medication Sig Dispense Refill  . aspirin 325 MG tablet Take 325 mg by mouth daily.      . tadalafil (CIALIS) 5 MG tablet Take 5 mg by mouth daily as needed.       No current facility-administered medications on file prior to visit.    Allergies  Allergen Reactions  . Tetracycline     REACTION: Nervous,shakey    Past Medical History  Diagnosis Date  . Coronary artery disease   . Dyslipidemia   . Thyroid disease     hypothyroidism  . MI (myocardial infarction) july of 2000    stent  . Acid reflux   . Palpitations     hx of    Past Surgical History    Procedure Laterality Date  . Carotid endarterectomy    . Cardiac catheterization  2000    stent  . Coronary angioplasty  jan. 2001  . Coronary angioplasty with stent placement  june 2001    stent  . Coronary artery bypass graft  dec. 2002    LIMA graft to LAD,RIMA graft to the intermediate branch and saphenous vein graft  to the posterior lateral and posterior descending branches of the right coronary  . Appendectomy  2001    History  Smoking status  . Current Every Day Smoker -- 0.50 packs/day for 10 years  . Types: Cigarettes  Smokeless tobacco  . Never Used    History  Alcohol Use  . Yes    Comment: occ.    Family History  Problem Relation Age of Onset  . Heart disease Father     cabg   . Liver cancer Mother     Review of Systems: As noted in history of present illness. All other systems were reviewed and are negative.  Physical Exam: BP 160/92  Pulse 66  Ht 5\' 9"  (1.753 m)  Wt 222 lb 12.8 oz (101.061 kg)  BMI 32.89 kg/m2  SpO2 95% He is a well-developed obese white male in no acute distress.The patient is alert and oriented x 3.  The mood and affect are normal.  The skin is  warm and dry.  Color is normal.  The HEENT exam reveals that the sclera are nonicteric.  The mucous membranes are moist.  The carotids are 2+ without bruits.  There is no thyromegaly.  There is no JVD.  The lungs are clear.   The heart exam reveals a regular rate with a normal S1 and S2.  There are no murmurs, gallops, or rubs.  The PMI is not displaced.   Abdominal exam reveals good bowel sounds.   There is no hepatosplenomegaly or tenderness.  There are no masses.  Exam of the legs reveal no clubbing, cyanosis, or edema.  The legs are without rashes.  The distal pulses are intact.  Cranial nerves II - XII are intact.  Motor and sensory functions are intact.  The gait is normal.  LABORATORY DATA:  ECG demonstrates normal sinus rhythm with left anterior fascicular block. It is otherwise normal.  Rate is 64 beats per minute.  Assessment / Plan: 1. Coronary disease status post multiple prior interventions. Status post CABG in 2002. Last Myoview study in 2011 showed no significant ischemia. He is asymptomatic. I reinforced the need for aggressive risk factor modification. He needs to follow a heart healthy diet to get regular aerobic exercise. I recommended smoking cessation. I'll followup again in 6 months. May want to consider stress testing after his next visit.  2. Hypertension. Patient has not been taking his medication. We'll resume lisinopril 10 mg daily.  3. Hyperlipidemia. Currently on Lipitor and Zetia. We'll schedule him for fasting lab work including chemistries and lipid panel.  4. Tobacco abuse. I strongly recommended smoking cessation.

## 2012-07-21 ENCOUNTER — Other Ambulatory Visit (INDEPENDENT_AMBULATORY_CARE_PROVIDER_SITE_OTHER): Payer: BC Managed Care – PPO

## 2012-07-21 DIAGNOSIS — F17200 Nicotine dependence, unspecified, uncomplicated: Secondary | ICD-10-CM

## 2012-07-21 DIAGNOSIS — Z951 Presence of aortocoronary bypass graft: Secondary | ICD-10-CM

## 2012-07-21 DIAGNOSIS — Z720 Tobacco use: Secondary | ICD-10-CM

## 2012-07-21 DIAGNOSIS — E785 Hyperlipidemia, unspecified: Secondary | ICD-10-CM

## 2012-07-21 DIAGNOSIS — I2510 Atherosclerotic heart disease of native coronary artery without angina pectoris: Secondary | ICD-10-CM

## 2012-07-21 LAB — BASIC METABOLIC PANEL
BUN: 12 mg/dL (ref 6–23)
CO2: 27 mEq/L (ref 19–32)
Calcium: 8.9 mg/dL (ref 8.4–10.5)
Creatinine, Ser: 1.3 mg/dL (ref 0.4–1.5)
Glucose, Bld: 112 mg/dL — ABNORMAL HIGH (ref 70–99)

## 2012-07-21 LAB — HEPATIC FUNCTION PANEL
AST: 13 U/L (ref 0–37)
Alkaline Phosphatase: 38 U/L — ABNORMAL LOW (ref 39–117)
Total Bilirubin: 0.3 mg/dL (ref 0.3–1.2)

## 2012-07-21 LAB — LIPID PANEL: Total CHOL/HDL Ratio: 4

## 2012-08-12 ENCOUNTER — Other Ambulatory Visit: Payer: Self-pay | Admitting: Cardiology

## 2012-10-10 ENCOUNTER — Ambulatory Visit (INDEPENDENT_AMBULATORY_CARE_PROVIDER_SITE_OTHER): Payer: BC Managed Care – PPO | Admitting: Medical

## 2012-10-10 ENCOUNTER — Encounter: Payer: Self-pay | Admitting: Medical

## 2012-10-10 VITALS — BP 130/80 | HR 66 | Temp 98.3°F | Resp 16 | Wt 221.0 lb

## 2012-10-10 DIAGNOSIS — J329 Chronic sinusitis, unspecified: Secondary | ICD-10-CM

## 2012-10-10 DIAGNOSIS — F17200 Nicotine dependence, unspecified, uncomplicated: Secondary | ICD-10-CM

## 2012-10-10 MED ORDER — AMOXICILLIN-POT CLAVULANATE 875-125 MG PO TABS: 1.0000 | ORAL_TABLET | Freq: Two times a day (BID) | ORAL | Status: DC

## 2012-10-10 NOTE — Progress Notes (Signed)
Subjective:  Todd Kelley is a 47 y.o. male who presents for possible sinus infection.  Symptoms include sinus pressure, head congestion, ear pressure, chest congestion, feels fatigue, sore throat, some nauseas, some body aches, don't feel good, has had some chills intermittent.  Denies rash, no vomiting.  Past history is significant for hx/o sinusitis. Patient is a smoker.  Using Zyrtec for symptoms.  Daughter and grandchildren have been sick, daughter on antibiotics for similar URI symptoms.   No other aggravating or relieving factors.  No other c/o.  ROS as in subjective   Objective: Filed Vitals:   10/10/12 1501  BP: 130/80  Pulse: 66  Temp: 98.3 F (36.8 C)  Resp: 16    General appearance: Alert, WD/WN, no distress                             Skin: warm, no rash                           Head: + sinus tenderness                            Eyes: conjunctiva normal, corneas clear, PERRLA                            Ears: pearly TMs, external ear canals normal                          Nose: septum midline, turbinates swollen, with erythema and clear discharge             Mouth/throat: MMM, tongue normal, mild pharyngeal erythema                           Neck: supple, no adenopathy, no thyromegaly, nontender                         Lungs: CTA bilaterally, no wheezes, rales, or rhonchi      Assessment and Plan:   Encounter Diagnoses  Name Primary?  . Sinusitis Yes  . Tobacco use disorder    Prescription given for Augmentin.  Can use OTC Mucinex for congestion.  Tylenol or Ibuprofen OTC for fever and malaise.  Discussed symptomatic relief, nasal saline flush, and call or return if worse or not improving in 2-3 days.

## 2012-10-10 NOTE — Patient Instructions (Signed)
Using Saline Nose Drops with Bulb Syringe A bulb syringe is used to clear your nose. You may use it when you have a stuffy nose, nasal congestion, sinus pressure, or sneezing.   SALINE SOLUTION You can buy nose drops at your local drug store. You can also make nose drops yourself. Mix 1 cup of water with  teaspoon of salt. Stir. Store this mixture at room temperature. Make a new batch daily.  USE THE BULB IN COMBINATION WITH SALINE NOSE DROPS  Squeeze the air out of the bulb before suctioning the saline mixture.  While still squeezing the bulb flat, place the tip of the bulb into the saline mixture.  Let air come back into the bulb.  This will suction up the saline mixture.  Gently flush one nostril at a time.  Salt water nose drops will then moisten your  congested nose and loosen secretions before suctioning.  Use the bulb syringe as directed below to suction.  USING THE BULB SYRINGE TO SUCTION  While still squeezing the bulb flat, place the tip of the bulb into a nostril. Let air come back into the bulb. The suction will pull snot out of the nose and into the bulb.  Repeat on the other nostril.  Squeeze syringe several times into a tissue.  CLEANING THE BULB SYRINGE  Clean the bulb syringe every day with hot soapy water.  Clean the inside of the bulb by squeezing the bulb while the tip is in soapy water.  Rinse by squeezing the bulb while the tip is in clean hot water.  Store the bulb with the tip side down on paper towel.  HOME CARE INSTRUCTIONS   Use saline nose drops often to keep the nose open and not stuffy.  Throw away used salt water. Make a new solution every time.  Do not use the same solution and dropper for another person  If you do not prefer to use nasal saline flush, other options include nasal saline spray or the Neti Pot, both of which are available over the counter at your pharmacy.   Sinusitis Sinuses are air pockets within the bones of your face.  The growth of bacteria within a sinus leads to infection. Infection keeps the sinuses from draining. This infection is called sinusitis. SYMPTOMS There will be different areas of pain depending on which sinuses have become infected.  The maxillary sinuses often produce pain beneath the eyes.   Frontal sinusitis may cause pain in the middle of the forehead and above the eyes.  Other problems (symptoms) include:  Toothaches.   Colored, pus-like (purulent) drainage from the nose.   Any swelling, warmth, or tenderness over the sinus areas may be signs of infection.  TREATMENT Sinusitis is most often determined by an exam and you may have x-rays taken. If x-rays have been taken, make sure you obtain your results. Or find out how you are to obtain them. Your caregiver may give you medications (antibiotics). These are medications that will help kill the infection. You may also be given a medication (decongestant) that helps to reduce sinus swelling.  HOME CARE INSTRUCTIONS  Only take over-the-counter or prescription medicines for pain, discomfort, or fever as directed by your caregiver.   Drink extra fluids. Fluids help thin the mucus so your sinuses can drain more easily.   Applying either moist heat or ice packs to the sinus areas may help relieve discomfort.   Use saline nasal sprays to help moisten your sinuses. The   sprays can be found at your local drugstore.  SEEK IMMEDIATE MEDICAL CARE IF YOU DEVELOP:  High fever that is still present after two days of antibiotic treatment.   Increasing pain, severe headaches, or toothache.   Nausea, vomiting, or drowsiness.   Unusual swelling around the face or trouble seeing.  MAKE SURE YOU:   Understand these instructions.   Will watch your condition.   Will get help right away if you are not doing well or get worse.  Document Released: 02/15/2005 Document Re-Released: 01/29/2008 [employer] Patient Information 2011 [employer].   

## 2012-10-25 ENCOUNTER — Telehealth: Payer: Self-pay | Admitting: Internal Medicine

## 2012-10-25 NOTE — Telephone Encounter (Signed)
I need more info that a simple request for antibiotic.   What is going on, symptoms, concerns?  He already completed one course of Augmentin recently.

## 2012-10-25 NOTE — Telephone Encounter (Signed)
Refill request for amoxicillin/ clavulan 875-125mg  #20 to [facility]

## 2012-10-26 NOTE — Telephone Encounter (Signed)
Left message for pt to call me back 

## 2012-10-26 NOTE — Telephone Encounter (Signed)
Pt does not need this med. Probably sent by mistake

## 2013-02-27 ENCOUNTER — Ambulatory Visit (INDEPENDENT_AMBULATORY_CARE_PROVIDER_SITE_OTHER): Payer: BC Managed Care – PPO | Admitting: Medical

## 2013-02-27 ENCOUNTER — Encounter: Payer: Self-pay | Admitting: Medical

## 2013-02-27 VITALS — BP 140/100 | HR 60 | Temp 98.0°F | Resp 16 | Wt 218.0 lb

## 2013-02-27 DIAGNOSIS — R062 Wheezing: Secondary | ICD-10-CM

## 2013-02-27 DIAGNOSIS — J209 Acute bronchitis, unspecified: Secondary | ICD-10-CM

## 2013-02-27 MED ORDER — GUAIFENESIN-CODEINE 100-10 MG/5ML PO SYRP: 5.0000 mL | ORAL_SOLUTION | Freq: Three times a day (TID) | ORAL | Status: DC | PRN

## 2013-02-27 MED ORDER — ALBUTEROL SULFATE HFA 108 (90 BASE) MCG/ACT IN AERS: 2.0000 | INHALATION_SPRAY | Freq: Four times a day (QID) | RESPIRATORY_TRACT | Status: DC | PRN

## 2013-02-27 MED ORDER — LEVOFLOXACIN 500 MG PO TABS: 500.0000 mg | ORAL_TABLET | Freq: Every day | ORAL | Status: DC

## 2013-02-27 NOTE — Progress Notes (Signed)
Subjective:  Todd Kelley is a 47 y.o. male who presents for cough and respiratory infection x 1 wk.  Symptoms include cough, chest congestion, some head congestion, low grade fever early last week, sore throat, headache and neck pain from coughing so much, and a little wheezing the last few days.  Denies NVD, no teeth pain, no sinus pressure.  Treatment to date: cough suppressants and tylenol severe cold, nyquil, some Tussionex of daughter's.  +sick contacts including pneumonia and flu contacts.   He does smoke.   No other aggravating or relieving factors.  No other c/o.  The following portions of the patient's history were reviewed and updated as appropriate: allergies, current medications, past family history, past medical history, past social history, past surgical history and problem list.  ROS as in subjective  Past Medical History  Diagnosis Date  . Coronary artery disease   . Dyslipidemia   . Thyroid disease     hypothyroidism  . MI (myocardial infarction) july of 2000    stent  . Acid reflux   . Palpitations     hx of     Objective: BP 140/100  Pulse 60  Temp(Src) 98 F (36.7 C) (Oral)  Resp 16  Wt 218 lb (98.884 kg)   General appearance: Alert, WD/WN, no distress, ill appearing                             Skin: warm, no rash, no diaphoresis                           Head: no sinus tenderness                            Eyes: conjunctiva normal, corneas clear, PERRLA                            Ears: pearly TMs, external ear canals normal                          Nose: septum midline, turbinates swollen, with erythema and clear discharge             Mouth/throat: MMM, tongue normal, mild pharyngeal erythema                           Neck: supple, no adenopathy, no thyromegaly, nontender                          Heart: RRR, normal S1, S2, no murmurs                         Lungs: +bronchial breath sounds, +scattered rhonchi, no wheezes, no rales  Extremities: no edema, nontender     Assessment: Encounter Diagnoses  Name Primary?  . Acute bronchitis Yes  . Wheezing     Plan:  Medication orders today include: Cheratussin prn use, Levaquin, albuterol inhaler.  Discussed diagnosis and treatment of bronchitis.  Tylenol or Ibuprofen OTC for fever and malaise.  Call/return in 2-3 days if symptoms are worse or not improving.  Advised that cough may linger even after the infection is improved.

## 2013-07-26 ENCOUNTER — Other Ambulatory Visit: Payer: Self-pay | Admitting: Cardiology

## 2013-07-30 ENCOUNTER — Ambulatory Visit (INDEPENDENT_AMBULATORY_CARE_PROVIDER_SITE_OTHER): Payer: BC Managed Care – PPO | Admitting: Medical

## 2013-07-30 ENCOUNTER — Encounter: Payer: Self-pay | Admitting: Medical

## 2013-07-30 VITALS — BP 138/82 | HR 60 | Temp 98.3°F | Resp 16 | Wt 226.0 lb

## 2013-07-30 DIAGNOSIS — T148 Other injury of unspecified body region: Secondary | ICD-10-CM

## 2013-07-30 DIAGNOSIS — W57XXXA Bitten or stung by nonvenomous insect and other nonvenomous arthropods, initial encounter: Secondary | ICD-10-CM

## 2013-07-30 MED ORDER — CEPHALEXIN 500 MG PO CAPS
500.0000 mg | ORAL_CAPSULE | Freq: Three times a day (TID) | ORAL | Status: DC
Start: 2013-07-30 — End: 2013-10-24

## 2013-07-30 NOTE — Progress Notes (Signed)
Subjective:   Todd Kelley is a 48 y.o. male who presents for evaluation of insect bite of the right wrist.   Rash started 1 day ago.  Was standing under a tree when he felt something on the back of his neck, and when he went to pull his hand away he felt a bite.  Since then he has had an area of redness on his right wrist.  He had a little headache and had a little difficulty sleeping last night.  Denies fever, nausea, vomiting, warmth, joint aches.  No other aggravating or relieving factors. No other complaint.  The following portions of the patient's history were reviewed and updated as appropriate: allergies, current medications, past family history, past medical history, past social history and problem list.  Review of Systems As in subjective above   Objective:   Gen: wd, wn, nad Skin: Right volar wrist medially including the medial palm with a 4 cm area of erythema, very faint induration, but there is linear streaking down the medial form for about 4 cm, there is slight warmth to the area but no fluctuance otherwise skin unremarkable MSK: Tender over the insect bite area the right wrist otherwise nontender normal range of motion of wrist and hand Arm is neurovascularly intact   Assessment:      Encounter Diagnosis  Name Primary?  . Insect bite Yes    Plan:   Discussed symptoms and exam findings.  Etiology most likely insect bite, spider bite versus bee likely.  No obvious sign of black widow or brown recluse bite at this time.  Prescribed: Keflex, advised arm elevation, warm compress, over-the-counter Benadryl and ibuprofen 3 times a day.  Discussed symptoms or signs of prompt immediate recheck.  Followup when necessary

## 2013-09-04 ENCOUNTER — Telehealth: Payer: Self-pay | Admitting: Cardiology

## 2013-09-04 NOTE — Telephone Encounter (Signed)
Patient states he has no refills left on his medications and the pharmacy told him they cannot be refilled until he sees Dr. Martinique.  Dr. Martinique has no opening until October and we do not have the schedule that far out.  Will we refill until he can have an appointment in October?

## 2013-09-07 ENCOUNTER — Other Ambulatory Visit: Payer: Self-pay | Admitting: Cardiology

## 2013-09-07 MED ORDER — ATORVASTATIN CALCIUM 10 MG PO TABS: 10.0000 mg | ORAL_TABLET | Freq: Every day | ORAL | Status: DC

## 2013-09-07 MED ORDER — FENOFIBRATE 145 MG PO TABS: 145.0000 mg | ORAL_TABLET | Freq: Every day | ORAL | Status: DC

## 2013-09-07 MED ORDER — EZETIMIBE 10 MG PO TABS: 10.0000 mg | ORAL_TABLET | Freq: Every day | ORAL | Status: DC

## 2013-09-07 MED ORDER — LISINOPRIL 10 MG PO TABS: 10.0000 mg | ORAL_TABLET | Freq: Every day | ORAL | Status: DC

## 2013-09-07 NOTE — Telephone Encounter (Signed)
Spoke to patient he stated he called office last week to make appointment with Dr.Jordan.Stated he was told no appointments and to call back to schedule.Stated he needed a refills on all medications.Appointment scheduled with Dr.Jordan 11/09/13 at 3:30 pm at [facility] office.Refills sent to pharmacy.Advised to keep appointment scheduled with Dr.Jordan.

## 2013-09-07 NOTE — Telephone Encounter (Signed)
Is this ok to refill for patient or should this be sent to pcp? Please advise. Thanks, MI

## 2013-10-24 ENCOUNTER — Ambulatory Visit (INDEPENDENT_AMBULATORY_CARE_PROVIDER_SITE_OTHER): Payer: BC Managed Care – PPO | Admitting: Family Medicine

## 2013-10-24 ENCOUNTER — Encounter: Payer: Self-pay | Admitting: Family Medicine

## 2013-10-24 VITALS — BP 150/98 | HR 68

## 2013-10-24 DIAGNOSIS — R040 Epistaxis: Secondary | ICD-10-CM

## 2013-10-24 NOTE — Patient Instructions (Signed)
If you begin bleeding again tonight, go to the emergency room. Tomorrow if during office hours call me for referral to ENT.

## 2013-10-24 NOTE — Progress Notes (Signed)
   Subjective:    Patient ID: Todd Kelley, male    DOB: [birth year], 48 y.o.   MRN: [id number]  HPI He is here for evaluation of a nosebleed. This started this morning on the left side. He does have underlying allergies and does have more difficulty this time of year. Presently he is on an antihistamine. The bleeding got worse and he is here for evaluation. He has had more nasal congestion recently.   Review of Systems     Objective:   Physical Exam Bleeding noted mainly from the left nostril. A cotton ball impregnated with epinephrine and Xylocaine was placed in the left nostril and compression applied. After approximately 10 minutes evaluation of the left nose showed no bleeding or evidence of recent bleeding. The anterior triangle looked entirely normal. Right nares was normal.       Assessment & Plan:  Epistaxis  at this time the bleeding has stopped and there is no evidence of recent bleed in the Hesselbach's triangle. If he starts bleeding again tonight. Have instructed him on proper care of the nosebleed and go to the emergency room for further evaluation.

## 2013-11-09 ENCOUNTER — Ambulatory Visit (INDEPENDENT_AMBULATORY_CARE_PROVIDER_SITE_OTHER): Payer: BC Managed Care – PPO | Admitting: Cardiology

## 2013-11-09 ENCOUNTER — Encounter: Payer: Self-pay | Admitting: Cardiology

## 2013-11-09 VITALS — BP 122/86 | HR 67 | Ht 70.0 in | Wt 222.6 lb

## 2013-11-09 DIAGNOSIS — I2510 Atherosclerotic heart disease of native coronary artery without angina pectoris: Secondary | ICD-10-CM

## 2013-11-09 DIAGNOSIS — E785 Hyperlipidemia, unspecified: Secondary | ICD-10-CM

## 2013-11-09 DIAGNOSIS — Z720 Tobacco use: Secondary | ICD-10-CM

## 2013-11-09 DIAGNOSIS — Z951 Presence of aortocoronary bypass graft: Secondary | ICD-10-CM

## 2013-11-09 DIAGNOSIS — F17200 Nicotine dependence, unspecified, uncomplicated: Secondary | ICD-10-CM

## 2013-11-09 NOTE — Patient Instructions (Signed)
We will schedule you for fasting lab work  We will also schedule you for a stress nuclear study in December.  Continue your efforts at weight loss and exercise  Quit smoking!

## 2013-11-09 NOTE — Progress Notes (Signed)
Todd Kelley Date of Birth: 1965/09/10 Medical Record #[id number]  History of Present Illness: Todd Kelley is seen today for yearly followup. He has a history of coronary disease. He presented in July of 2000 with a lateral myocardial infarction. He had stenting of an intermediate branch at that time. He had angioplasty for restenosis in 2001x2. 2002 he again had intervention with brachy therapy. He later had angioplasty of the distal LAD. December 2002 he had recurrent angina and recurrent stenoses. He underwent bypass surgery with an LIMA graft to the LAD, RIMA graft to the intermediate, and saphenous vein graft to the PDA and posterior lateral branches. Postoperative course was complicated by tamponade requiring a pericardial window. He also developed a DVT. His last stress test in September of 2011 showed a fixed basal to mid lateral wall defect consistent with infarct. Ejection fraction was 54%.  On followup today he reports that he is doing well. Stress is much now. Has changed jobs. Feels very well. Has been working on diet and has lost 9 lbs. Hoping to get down to 200 lbs. Still smokes sporadically- usually related to stress. He did have a nosebleed 2 weeks ago that resolved.  Current Outpatient Prescriptions on File Prior to Visit  Medication Sig Dispense Refill  . albuterol (PROVENTIL HFA;VENTOLIN HFA) 108 (90 BASE) MCG/ACT inhaler Inhale 2 puffs into the lungs every 6 (six) hours as needed for wheezing or shortness of breath.  1 Inhaler  0  . aspirin 325 MG tablet Take 325 mg by mouth daily.      Marland Kitchen atorvastatin (LIPITOR) 10 MG tablet Take 1 tablet (10 mg total) by mouth daily.  30 tablet  2  . cetirizine (ZYRTEC) 5 MG tablet Take 5 mg by mouth daily.      Marland Kitchen ezetimibe (ZETIA) 10 MG tablet Take 1 tablet (10 mg total) by mouth daily.  30 tablet  2  . fenofibrate (TRICOR) 145 MG tablet Take 1 tablet (145 mg total) by mouth daily.  30 tablet  2  . levothyroxine (SYNTHROID, LEVOTHROID) 175 MCG tablet  TAKE ONE (1) TABLET BY MOUTH EVERY DAY BEFORE BREAKFAST  30 tablet  2  . lisinopril (PRINIVIL,ZESTRIL) 10 MG tablet Take 1 tablet (10 mg total) by mouth daily.  30 tablet  2  . tadalafil (CIALIS) 5 MG tablet Take 5 mg by mouth daily as needed.       No current facility-administered medications on file prior to visit.    Allergies  Allergen Reactions  . Tetracycline     REACTION: Nervous,shakey    Past Medical History  Diagnosis Date  . Coronary artery disease   . Dyslipidemia   . Thyroid disease     hypothyroidism  . MI (myocardial infarction) july of 2000    stent  . Acid reflux   . Palpitations     hx of    Past Surgical History  Procedure Laterality Date  . Carotid endarterectomy    . Cardiac catheterization  2000    stent  . Coronary angioplasty  jan. 2001  . Coronary angioplasty with stent placement  june 2001    stent  . Coronary artery bypass graft  dec. 2002    LIMA graft to LAD,RIMA graft to the intermediate branch and saphenous vein graft  to the posterior lateral and posterior descending branches of the right coronary  . Appendectomy  2001    History  Smoking status  . Current Every Day Smoker -- 0.50 packs/day for  10 years  . Types: Cigarettes  Smokeless tobacco  . Never Used    History  Alcohol Use  . Yes    Comment: occ.    Family History  Problem Relation Age of Onset  . Heart disease Father     cabg   . Liver cancer Mother     Review of Systems: As noted in history of present illness. All other systems were reviewed and are negative.  Physical Exam: BP 122/86  Pulse 67  Ht 5\' 10"  (1.778 m)  Wt 222 lb 9.6 oz (100.971 kg)  BMI 31.94 kg/m2 He is a well-developed  white male in no acute distress.HEENT is normal. The carotids are 2+ without bruits.  There is no thyromegaly.  There is no JVD.  The lungs are clear.   The heart exam reveals a regular rate with a normal S1 and S2.  There are no murmurs, gallops, or rubs.  The PMI is not  displaced.   Abdominal exam reveals good bowel sounds.   There is no hepatosplenomegaly or tenderness.  There are no masses.  Exam of the legs reveal no clubbing, cyanosis, or edema.    The distal pulses are intact.  Cranial nerves II - XII are intact.  Motor and sensory functions are intact.  The gait is normal.  LABORATORY DATA:  ECG demonstrates normal sinus rhythm with left anterior fascicular block. It is otherwise normal. Rate is 67 beats per minute.  Assessment / Plan: 1. Coronary disease status post multiple prior interventions. Status post CABG in 2002. Last Myoview study in 2011 showed no significant ischemia. He is asymptomatic. He is doing much better with lifestyle modification. I recommended complete smoking cessation. I recommend a stress Myoview study.  2. Hypertension. Controlled.   3. Hyperlipidemia. Currently on Lipitor and Zetia. We'll schedule him for fasting lab work including chemistries and lipid panel.  4. Tobacco abuse. I strongly recommended smoking cessation.

## 2013-11-13 LAB — HEPATIC FUNCTION PANEL
ALK PHOS: 45 U/L (ref 39–117)
ALT: 38 U/L (ref 0–53)
AST: 20 U/L (ref 0–37)
Albumin: 4.6 g/dL (ref 3.5–5.2)
BILIRUBIN INDIRECT: 0.3 mg/dL (ref 0.2–1.2)
Bilirubin, Direct: 0.1 mg/dL (ref 0.0–0.3)
Total Bilirubin: 0.4 mg/dL (ref 0.2–1.2)
Total Protein: 6.8 g/dL (ref 6.0–8.3)

## 2013-11-13 LAB — LIPID PANEL
Cholesterol: 151 mg/dL (ref 0–200)
HDL: 36 mg/dL — ABNORMAL LOW (ref >39)
LDL CALC: 98 mg/dL (ref 0–99)
Total CHOL/HDL Ratio: 4.2 Ratio
Triglycerides: 85 mg/dL (ref <150)
VLDL: 17 mg/dL (ref 0–40)

## 2013-11-13 LAB — BASIC METABOLIC PANEL
BUN: 13 mg/dL (ref 6–23)
CHLORIDE: 106 meq/L (ref 96–112)
CO2: 27 meq/L (ref 19–32)
Calcium: 9.6 mg/dL (ref 8.4–10.5)
Creat: 1.1 mg/dL (ref 0.50–1.35)
Glucose, Bld: 112 mg/dL — ABNORMAL HIGH (ref 70–99)
Potassium: 4.2 mEq/L (ref 3.5–5.3)
Sodium: 141 mEq/L (ref 135–145)

## 2013-12-11 ENCOUNTER — Other Ambulatory Visit: Payer: Self-pay | Admitting: Cardiology

## 2013-12-29 ENCOUNTER — Other Ambulatory Visit: Payer: Self-pay | Admitting: Cardiology

## 2014-02-06 ENCOUNTER — Telehealth (HOSPITAL_COMMUNITY): Payer: Self-pay

## 2014-02-06 NOTE — Telephone Encounter (Signed)
Encounter complete. 

## 2014-02-07 ENCOUNTER — Telehealth (HOSPITAL_COMMUNITY): Payer: Self-pay

## 2014-02-07 NOTE — Telephone Encounter (Signed)
Encounter complete. 

## 2014-02-08 ENCOUNTER — Ambulatory Visit (HOSPITAL_COMMUNITY)
Admission: RE | Admit: 2014-02-08 | Discharge: 2014-02-08 | Disposition: A | Discharge status: Home / Self Care | Payer: BC Managed Care – PPO | Source: Ambulatory Visit | Attending: Cardiology | Admitting: Cardiology

## 2014-02-08 DIAGNOSIS — R002 Palpitations: Secondary | ICD-10-CM | POA: Insufficient documentation

## 2014-02-08 DIAGNOSIS — R079 Chest pain, unspecified: Secondary | ICD-10-CM | POA: Diagnosis not present

## 2014-02-08 DIAGNOSIS — E663 Overweight: Secondary | ICD-10-CM | POA: Insufficient documentation

## 2014-02-08 DIAGNOSIS — I2510 Atherosclerotic heart disease of native coronary artery without angina pectoris: Secondary | ICD-10-CM

## 2014-02-08 DIAGNOSIS — Z720 Tobacco use: Secondary | ICD-10-CM

## 2014-02-08 DIAGNOSIS — F17210 Nicotine dependence, cigarettes, uncomplicated: Secondary | ICD-10-CM | POA: Diagnosis not present

## 2014-02-08 DIAGNOSIS — R5383 Other fatigue: Secondary | ICD-10-CM | POA: Diagnosis not present

## 2014-02-08 DIAGNOSIS — R0600 Dyspnea, unspecified: Secondary | ICD-10-CM | POA: Diagnosis not present

## 2014-02-08 DIAGNOSIS — I6529 Occlusion and stenosis of unspecified carotid artery: Secondary | ICD-10-CM | POA: Diagnosis not present

## 2014-02-08 DIAGNOSIS — Z8249 Family history of ischemic heart disease and other diseases of the circulatory system: Secondary | ICD-10-CM | POA: Insufficient documentation

## 2014-02-08 DIAGNOSIS — Z4509 Encounter for adjustment and management of other cardiac device: Secondary | ICD-10-CM | POA: Insufficient documentation

## 2014-02-08 DIAGNOSIS — Z951 Presence of aortocoronary bypass graft: Secondary | ICD-10-CM

## 2014-02-08 DIAGNOSIS — I10 Essential (primary) hypertension: Secondary | ICD-10-CM | POA: Diagnosis not present

## 2014-02-08 DIAGNOSIS — Z6831 Body mass index (BMI) 31.0-31.9, adult: Secondary | ICD-10-CM | POA: Insufficient documentation

## 2014-02-08 DIAGNOSIS — R9431 Abnormal electrocardiogram [ECG] [EKG]: Secondary | ICD-10-CM | POA: Insufficient documentation

## 2014-02-08 DIAGNOSIS — E785 Hyperlipidemia, unspecified: Secondary | ICD-10-CM

## 2014-02-08 MED ORDER — TECHNETIUM TC 99M SESTAMIBI GENERIC - CARDIOLITE
10.9000 | Freq: Once | INTRAVENOUS | Status: AC | PRN
  Administered 2014-02-08: 10.9 via INTRAVENOUS

## 2014-02-08 MED ORDER — TECHNETIUM TC 99M SESTAMIBI GENERIC - CARDIOLITE
30.5000 | Freq: Once | INTRAVENOUS | Status: AC | PRN
  Administered 2014-02-08: 31 via INTRAVENOUS

## 2014-02-08 NOTE — Procedures (Addendum)
[facility] [address] [place] [address] [phone]  Cardiology Nuclear Med Study  Todd Kelley is a 48 y.o. male     MRN : [id number]     DOB: 13-Dec-1965  Procedure Date: 02/08/2014  Nuclear Med Background Indication for Stress Test:  Graft Patency and Abnormal EKG History:  Asthma and CAD;MI-08/1998;CABG X4-01/2001;STENT/PTCA-08/1998;Last NUC MPI- Cardiac Risk Factors: Carotid Disease, Family History - CAD, Hypertension, Lipids, Overweight and Smoker  Symptoms:  Chest Pain, DOE, Fatigue and Palpitations   Nuclear Pre-Procedure Caffeine/Decaff Intake:  9:00pm NPO After: 7:00am   IV Site: R Forearm  IV 0.9% NS with Angio Cath:  22g  Chest Size (in):  48"  IV Started by: [name], RN  Height: 5\' 10"  (1.778 m)  Cup Size: n/a  BMI:  Body mass index is 31.85 kg/(m^2). Weight:  222 lb (100.699 kg)   Tech Comments:  n/a    Nuclear Med Study 1 or 2 day study: 1 day  Stress Test Type:  Stress  Order Authorizing Provider:  Peter Martinique, MD   Resting Radionuclide: Technetium 75m Sestamibi  Resting Radionuclide Dose: 10.9 mCi   Stress Radionuclide:  Technetium 63m Sestamibi  Stress Radionuclide Dose: 30.5 mCi           Stress Protocol Rest HR: 62 Stress HR: 155  Rest BP:129/56 Stress BP: 173/88  Exercise Time (min): 12:21 METS: 13.70   Predicted Max HR: 170 bpm % Max HR: 91.18 bpm Rate Pressure Product: 75102  Dose of Adenosine (mg):  n/a Dose of Lexiscan: n/a mg  Dose of Atropine (mg): n/a Dose of Dobutamine: n/a mcg/kg/min (at max HR)  Stress Test Technologist: [name], CCT Nuclear Technologist: [name], CNMT   Rest Procedure:  Myocardial perfusion imaging was performed at rest 45 minutes following the intravenous administration of Technetium 80m Sestamibi. Stress Procedure:  The patient performed treadmill exercise using a Bruce  Protocol for 12 minutes 21 seconds. The patient stopped due to  fatigue and knee pain. Patient denied any chest pain.  There were no significant ST-T wave changes.  Technetium 35m Sestamibi was injected IV at peak exercise and myocardial perfusion imaging was performed after a brief delay.  Transient Ischemic Dilatation (Normal <1.22):  1.15  QGS EDV:  141 ml QGS ESV:  62 ml LV Ejection Fraction: 56%    Rest ECG: NSR, RAD, NSST.  Stress ECG: No significant ST segment change suggestive of ischemia.  QPS Raw Data Images:  Acquisition technically good; LVE. Stress Images:  There is decreased uptake in the basal inferior lateral wall. Rest Images:  There is decreased uptake in the basal lateral wall, less prominent compared to the stress images. Subtraction (SDS):  These findings are consistent with soft tissue attenuation and mild ischemia in the basal inferior lateral wall.  Impression Exercise Capacity:  Good exercise capacity. BP Response:  Normal blood pressure response. Clinical Symptoms:  No chest pain or dyspnea. ECG Impression:  No significant ST segment change suggestive of ischemia. Comparison with Prior Nuclear Study: Inferior ischemia new compared to previous  Overall Impression:  Low risk stress nuclear study with a small, moderate intensity, partially reversible basal inferior lateral defect consistent with soft tissue attenuation and mild ischemia in the inferior basal wall..  LV Wall Motion:  NL LV Function; NL Wall Motion   [name], MD  02/08/2014 3:35 PM

## 2014-02-11 ENCOUNTER — Telehealth: Payer: Self-pay | Admitting: Cardiology

## 2014-02-11 NOTE — Telephone Encounter (Signed)
Returning your call from Friday. °

## 2014-02-11 NOTE — Telephone Encounter (Signed)
Returned call to patient myoview results given. 

## 2014-08-24 ENCOUNTER — Other Ambulatory Visit: Payer: Self-pay | Admitting: Cardiology

## 2014-08-27 NOTE — Telephone Encounter (Signed)
Per note 9.11.15

## 2014-10-30 ENCOUNTER — Other Ambulatory Visit: Payer: Self-pay | Admitting: Cardiology

## 2014-10-30 NOTE — Telephone Encounter (Signed)
REFILL 

## 2014-12-01 ENCOUNTER — Other Ambulatory Visit: Payer: Self-pay | Admitting: Cardiology

## 2015-01-05 ENCOUNTER — Other Ambulatory Visit: Payer: Self-pay | Admitting: Cardiology

## 2015-01-06 ENCOUNTER — Encounter: Payer: Self-pay | Admitting: Family Medicine

## 2015-01-06 ENCOUNTER — Ambulatory Visit (INDEPENDENT_AMBULATORY_CARE_PROVIDER_SITE_OTHER): Payer: BLUE CROSS/BLUE SHIELD | Admitting: Family Medicine

## 2015-01-06 VITALS — BP 140/80 | HR 68 | Temp 98.3°F | Ht 70.0 in | Wt 221.6 lb

## 2015-01-06 DIAGNOSIS — J309 Allergic rhinitis, unspecified: Secondary | ICD-10-CM

## 2015-01-06 DIAGNOSIS — J45909 Unspecified asthma, uncomplicated: Secondary | ICD-10-CM | POA: Diagnosis not present

## 2015-01-06 MED ORDER — ALBUTEROL SULFATE 108 (90 BASE) MCG/ACT IN AEPB: 2.0000 | INHALATION_SPRAY | Freq: Four times a day (QID) | RESPIRATORY_TRACT | Status: DC | PRN

## 2015-01-06 MED ORDER — AMOXICILLIN 875 MG PO TABS: 875.0000 mg | ORAL_TABLET | Freq: Two times a day (BID) | ORAL | Status: DC

## 2015-01-06 MED ORDER — HYDROCOD POLST-CPM POLST ER 10-8 MG/5ML PO SUER: 5.0000 mL | Freq: Two times a day (BID) | ORAL | Status: DC | PRN

## 2015-01-06 NOTE — Progress Notes (Signed)
   Subjective:    Patient ID: Todd Kelley, male    DOB: [birth year], 49 y.o.   MRN: [id number]  HPI He complains of a nine-day history this started with nasal congestion, rhinorrhea and dry cough followed by PND, slight sore throat. The cough has been slowly getting worse he now notes wheezing especially when he lies down. He does have underlying allergies and has placed himself back on Zyrtec. He also has a previous history of difficulty with asthma. He smokes but is not ready to quit. No fever, chills, earache, chest pain.   Review of Systems     Objective:   Physical Exam Alert and in no distress. Tympanic membranes and canals are normal. Pharyngeal area is normal. Neck is supple without adenopathy or thyromegaly. Cardiac exam shows a regular sinus rhythm without murmurs or gallops. Lungs are clear to auscultation.        Assessment & Plan:  Asthmatic bronchitis, unspecified asthma severity, uncomplicated - Plan: Albuterol Sulfate (PROAIR RESPICLICK) 940 (90 BASE) MCG/ACT AEPB, chlorpheniramine-HYDROcodone (TUSSIONEX PENNKINETIC ER) 10-8 MG/5ML SUER, amoxicillin (AMOXIL) 875 MG tablet  Allergic rhinitis, unspecified allergic rhinitis type  I will initially treat his coughing with albuterol and have him use Tussionex if he needs it. Amoxil was called him but recommend that he not fill it and see how he does over the next several days. He is comfortable with that approach.

## 2015-02-11 ENCOUNTER — Other Ambulatory Visit: Payer: Self-pay | Admitting: Cardiology

## 2015-02-11 NOTE — Telephone Encounter (Signed)
Rx request sent to pharmacy.  

## 2015-04-18 ENCOUNTER — Ambulatory Visit (INDEPENDENT_AMBULATORY_CARE_PROVIDER_SITE_OTHER): Payer: BLUE CROSS/BLUE SHIELD | Admitting: Cardiology

## 2015-04-18 ENCOUNTER — Encounter: Payer: Self-pay | Admitting: Cardiology

## 2015-04-18 VITALS — BP 172/100 | HR 58 | Ht 70.0 in | Wt 220.9 lb

## 2015-04-18 DIAGNOSIS — J45909 Unspecified asthma, uncomplicated: Secondary | ICD-10-CM

## 2015-04-18 DIAGNOSIS — Z720 Tobacco use: Secondary | ICD-10-CM

## 2015-04-18 DIAGNOSIS — I2510 Atherosclerotic heart disease of native coronary artery without angina pectoris: Secondary | ICD-10-CM | POA: Diagnosis not present

## 2015-04-18 DIAGNOSIS — E785 Hyperlipidemia, unspecified: Secondary | ICD-10-CM

## 2015-04-18 DIAGNOSIS — Z951 Presence of aortocoronary bypass graft: Secondary | ICD-10-CM | POA: Diagnosis not present

## 2015-04-18 LAB — BASIC METABOLIC PANEL
BUN: 11 mg/dL (ref 7–25)
CHLORIDE: 104 mmol/L (ref 98–110)
CO2: 29 mmol/L (ref 20–31)
Calcium: 9.5 mg/dL (ref 8.6–10.3)
Creat: 1.01 mg/dL (ref 0.70–1.33)
GLUCOSE: 116 mg/dL — AB (ref 65–99)
POTASSIUM: 4.8 mmol/L (ref 3.5–5.3)
SODIUM: 141 mmol/L (ref 135–146)

## 2015-04-18 LAB — LIPID PANEL
CHOL/HDL RATIO: 8.4 ratio — AB (ref <=5.0)
CHOLESTEROL: 278 mg/dL — AB (ref 125–200)
HDL: 33 mg/dL — ABNORMAL LOW (ref >=40)
LDL CALC: 206 mg/dL — AB (ref <130)
Triglycerides: 195 mg/dL — ABNORMAL HIGH (ref <150)
VLDL: 39 mg/dL — AB (ref <30)

## 2015-04-18 LAB — HEPATIC FUNCTION PANEL
ALBUMIN: 4.3 g/dL (ref 3.6–5.1)
ALT: 38 U/L (ref 9–46)
AST: 21 U/L (ref 10–35)
Alkaline Phosphatase: 57 U/L (ref 40–115)
Bilirubin, Direct: 0.1 mg/dL (ref <=0.2)
Indirect Bilirubin: 0.4 mg/dL (ref 0.2–1.2)
TOTAL PROTEIN: 6.6 g/dL (ref 6.1–8.1)
Total Bilirubin: 0.5 mg/dL (ref 0.2–1.2)

## 2015-04-18 LAB — TSH: TSH: 2.5 mIU/L (ref 0.40–4.50)

## 2015-04-18 MED ORDER — FENOFIBRATE 145 MG PO TABS: ORAL_TABLET | ORAL | Status: DC

## 2015-04-18 MED ORDER — LOSARTAN POTASSIUM 100 MG PO TABS: 100.0000 mg | ORAL_TABLET | Freq: Every day | ORAL | Status: DC

## 2015-04-18 MED ORDER — LEVOTHYROXINE SODIUM 175 MCG PO TABS: ORAL_TABLET | ORAL | Status: DC

## 2015-04-18 MED ORDER — ALBUTEROL SULFATE 108 (90 BASE) MCG/ACT IN AEPB: 2.0000 | INHALATION_SPRAY | Freq: Four times a day (QID) | RESPIRATORY_TRACT | Status: DC | PRN

## 2015-04-18 MED ORDER — ATORVASTATIN CALCIUM 10 MG PO TABS: ORAL_TABLET | ORAL | Status: DC

## 2015-04-18 NOTE — Progress Notes (Signed)
Todd Kelley Date of Birth: 1966/02/09 Medical Record [id number]  History of Present Illness: Todd Kelley is seen today for follow up CAD. He presented in July of 2000 with a lateral myocardial infarction. He had stenting of an intermediate branch at that time. He had angioplasty for restenosis in 2001x2. 2002 he again had intervention with brachy therapy. He later had angioplasty of the distal LAD. December 2002 he had recurrent angina and recurrent stenoses. He underwent bypass surgery with an LIMA graft to the LAD, RIMA graft to the intermediate, and saphenous vein graft to the PDA and posterior lateral branches. Postoperative course was complicated by pericardial tamponade requiring a pericardial window. He also developed a DVT. His last stress test in December 2015  showed a fixed basal to mid lateral wall defect consistent with infarct. Ejection fraction was 56%.  On follow up today he reports a lot of sinus congestion and has been taking decongestants regularly. He has a cough and thinks it is related to lisinopril. Still smoking- more cigars now. Doesn't monitor his BP.   Current Outpatient Prescriptions on File Prior to Visit  Medication Sig Dispense Refill  . Albuterol Sulfate (PROAIR RESPICLICK) 123XX123 (90 BASE) MCG/ACT AEPB Inhale 2 puffs into the lungs 4 (four) times daily as needed. 1 each 0  . amoxicillin (AMOXIL) 875 MG tablet Take 1 tablet (875 mg total) by mouth 2 (two) times daily. 20 tablet 0  . aspirin 325 MG tablet Take 325 mg by mouth daily.    Marland Kitchen atorvastatin (LIPITOR) 10 MG tablet TAKE ONE (1) TABLET EACH DAY 30 tablet 2  . cetirizine (ZYRTEC) 5 MG tablet Take 5 mg by mouth daily.    . chlorpheniramine-HYDROcodone (TUSSIONEX PENNKINETIC ER) 10-8 MG/5ML SUER Take 5 mLs by mouth every 12 (twelve) hours as needed for cough. 140 mL 0  . ezetimibe (ZETIA) 10 MG tablet Take 1 tablet (10 mg total) by mouth daily. (Patient not taking: Reported on 01/06/2015) 30 tablet 2  . fenofibrate  (TRICOR) 145 MG tablet TAKE ONE (1) TABLET EACH DAY 30 tablet 2  . levothyroxine (SYNTHROID, LEVOTHROID) 175 MCG tablet TAKE ONE (1) TABLET EACH DAY BEFORE BREAKFAST 30 tablet 2  . lisinopril (PRINIVIL,ZESTRIL) 10 MG tablet TAKE ONE (1) TABLET BY MOUTH EVERY DAY 30 tablet 2  . tadalafil (CIALIS) 5 MG tablet Take 5 mg by mouth daily as needed.     No current facility-administered medications on file prior to visit.    Allergies  Allergen Reactions  . Tetracycline     REACTION: Nervous,shakey    Past Medical History  Diagnosis Date  . Coronary artery disease   . Dyslipidemia   . Thyroid disease     hypothyroidism  . MI (myocardial infarction) ([place]) july of 2000    stent  . Acid reflux   . Palpitations     hx of    Past Surgical History  Procedure Laterality Date  . Carotid endarterectomy    . Cardiac catheterization  2000    stent  . Coronary angioplasty  jan. 2001  . Coronary angioplasty with stent placement  june 2001    stent  . Coronary artery bypass graft  dec. 2002    LIMA graft to LAD,RIMA graft to the intermediate branch and saphenous vein graft  to the posterior lateral and posterior descending branches of the right coronary  . Appendectomy  2001    History  Smoking status  . Current Every Day Smoker -- 0.50 packs/day for  10 years  . Types: Cigarettes  Smokeless tobacco  . Never Used    History  Alcohol Use  . Yes    Comment: occ.    Family History  Problem Relation Age of Onset  . Heart disease Father     cabg   . Liver cancer Mother     Review of Systems: As noted in history of present illness. All other systems were reviewed and are negative.  Physical Exam: There were no vitals taken for this visit. He is a well-developed  white male in no acute distress.HEENT is normal. The carotids are 2+ without bruits.  There is no thyromegaly.  There is no JVD.  The lungs are clear.   The heart exam reveals a regular rate with a normal S1 and S2.   There are no murmurs, gallops, or rubs.  The PMI is not displaced.   Abdominal exam reveals good bowel sounds.     There are no masses.  Exam of the legs reveal no clubbing, cyanosis, or edema.    The distal pulses are intact.  Cranial nerves II - XII are intact.  Motor and sensory functions are intact.  The gait is normal.  LABORATORY DATA:  ECG today:NSR with PVCs, LAFB. I have personally reviewed and interpreted this study.  Assessment / Plan: 1. Coronary disease status post multiple prior interventions. Status post CABG in 2002. Last Myoview study in 2015 showed no significant ischemia. He is asymptomatic.  I recommended complete smoking cessation.   2. Hypertension. Poorly controlled. Probably related to taking decongestants. Will stop decongestants. Switch lisinopril (due to cough) to losartan 100 mg daily. Patient to repeat BP in 2-3 weeks.   3. Hyperlipidemia. Currently on Lipitor and Zetia. We'll schedule him for fasting lab work today including chemistries and lipid panel.  4. Tobacco abuse. I strongly recommended smoking cessation.  5. Hypothyroidism- check TSH.

## 2015-04-18 NOTE — Patient Instructions (Signed)
Stay off decongestants.  Quit smoking completely  Stop lisinopril  We will start losartan 100 mg daily instead.  We will check fasting lab work.  I will see you in on year

## 2015-04-25 ENCOUNTER — Other Ambulatory Visit: Payer: Self-pay

## 2015-04-25 DIAGNOSIS — E785 Hyperlipidemia, unspecified: Secondary | ICD-10-CM

## 2015-05-20 ENCOUNTER — Other Ambulatory Visit: Payer: Self-pay | Admitting: Cardiology

## 2015-05-20 NOTE — Telephone Encounter (Signed)
Rx(s) sent to pharmacy electronically. Refilled per previous office visit refill from 04/18/15

## 2015-06-05 ENCOUNTER — Other Ambulatory Visit: Payer: Self-pay

## 2015-06-05 ENCOUNTER — Telehealth: Payer: Self-pay | Admitting: Cardiology

## 2015-06-05 MED ORDER — TADALAFIL 5 MG PO TABS: ORAL_TABLET | ORAL | Status: DC

## 2015-06-05 NOTE — Telephone Encounter (Signed)
New message ° ° ° ° ° ° °Talk to the nurse regarding his medications °

## 2015-06-05 NOTE — Telephone Encounter (Signed)
Returned call to patient no answer.LMTC. 

## 2015-06-05 NOTE — Telephone Encounter (Signed)
Received call back from patient he stated he is in process of getting a new urologist.Stated he takes cialis 5 mg daily for BPH and he needs a refill.Advised I will send refill to pharmacy.

## 2015-07-10 ENCOUNTER — Telehealth: Payer: Self-pay | Admitting: Cardiology

## 2015-07-10 NOTE — Telephone Encounter (Signed)
New message      Pt c/o medication issue:  1. Name of Medication: losartan  2. How are you currently taking this medication (dosage and times per day)? 100 mg daily 3. Are you having a reaction (difficulty breathing--STAT)? no  4. What is your medication issue? Pt is having a side effect from the medication and what to discuss it with the nurse.  He would not tell me what it was

## 2015-07-10 NOTE — Telephone Encounter (Signed)
Spoke w/ patient. He explains he was on lisinopril in the past, which he was taken off of due to SE of dry cough. He was put on losartan in place of this, but notes since going on losartan he has had a more difficult time w/ "performance". Did not state if issue was w lipido or erectile dysfunction specifically, just notes he would strongly prefer to go back onto the lisinopril despite the cough SE.  Aware I will route to pharmD to review.

## 2015-07-10 NOTE — Telephone Encounter (Signed)
Left pt msg to call. 

## 2015-07-10 NOTE — Telephone Encounter (Signed)
We can try him on valsartan 160mg  daily as this medication is less associated with sexual dysfunction. Schedule him for BP clinic follow up in about 2-4 weeks if possible. Thanks!

## 2015-07-11 MED ORDER — VALSARTAN 160 MG PO TABS: 160.0000 mg | ORAL_TABLET | Freq: Every day | ORAL | Status: DC

## 2015-07-11 NOTE — Telephone Encounter (Signed)
Called patient and discussed recommendations.  Discussed dose equivalency, known SE's, recommendation to continue to check BP at home and to monitor for new SE's or improvement to existing SE's/symptoms. Encouraged to call for any other needs, and to update [place] in 3-4 weeks, consider BP clinic appt to follow up.  Rx called to patient's preferred pharmacy. Pt voiced understanding and thanks.

## 2015-07-14 ENCOUNTER — Encounter: Payer: Self-pay | Admitting: Medical

## 2015-07-14 ENCOUNTER — Ambulatory Visit (INDEPENDENT_AMBULATORY_CARE_PROVIDER_SITE_OTHER): Payer: BLUE CROSS/BLUE SHIELD | Admitting: Medical

## 2015-07-14 VITALS — BP 160/100 | HR 76 | Temp 97.1°F | Resp 16 | Wt 214.0 lb

## 2015-07-14 DIAGNOSIS — I10 Essential (primary) hypertension: Secondary | ICD-10-CM | POA: Diagnosis not present

## 2015-07-14 DIAGNOSIS — N400 Enlarged prostate without lower urinary tract symptoms: Secondary | ICD-10-CM

## 2015-07-14 DIAGNOSIS — I2510 Atherosclerotic heart disease of native coronary artery without angina pectoris: Secondary | ICD-10-CM

## 2015-07-14 DIAGNOSIS — J45909 Unspecified asthma, uncomplicated: Secondary | ICD-10-CM | POA: Diagnosis not present

## 2015-07-14 DIAGNOSIS — J0111 Acute recurrent frontal sinusitis: Secondary | ICD-10-CM

## 2015-07-14 MED ORDER — AMOXICILLIN 500 MG PO TABS: ORAL_TABLET | ORAL | Status: DC

## 2015-07-14 MED ORDER — TADALAFIL 5 MG PO TABS: ORAL_TABLET | ORAL | Status: DC

## 2015-07-14 NOTE — Patient Instructions (Signed)
In the future, when having cough and congestion, use either Mucinex, Mucinex DM (cough), or Coricidin HBP.  These are safer to use with high blood pressure.  AVOID  Sudafed, pseudoephedrine, phenylephrine or labels that say sinus decongestant

## 2015-07-14 NOTE — Progress Notes (Signed)
Subjective: Chief Complaint  Patient presents with  . Sore Throat    coughing up mucous that is green, yellow and red. has been needing his inhaler.    Here for illness.  Has some crud he caught from coworkers. Last few nights having to use inhaler.   Blowing out colored mucous yellow green and red.  Now having some productive cough as well.   Has had symptoms the last several days.  Leaving to go out of town tomorrow, was advised by his boss to get checked out.  Cough, head pressure, some sore throat, sore throat has been worse.   Worse lying flat with congestion.   Denies fever, but was burning up Saturday.  No NVD.  Used Flonase a few times  Used some aleve cold and sinus.  Was on losartan but this was stopped by cariology due to sexual side effects. Was changed to different BP medication but hasn't picked this up yet.    Wants refill on daily Cialis for BPH.  Helps him urinate. Has BPH, was seeing urology but they wanted him to try experimental drugs, and between the doctor rescheduling and him rescheduling, could never work things out with appointments.    No other aggravating or relieving factors. No other complaint.  Past Medical History  Diagnosis Date  . Coronary artery disease   . Dyslipidemia   . Thyroid disease     hypothyroidism  . MI (myocardial infarction) ([place]) july of 2000    stent  . Acid reflux   . Palpitations     hx of   ROS as in subjective   Objective: BP 160/100 mmHg  Pulse 76  Temp(Src) 97.1 F (36.2 C) (Tympanic)  Resp 16  Wt 214 lb (97.07 kg)  BP Readings from Last 3 Encounters:  07/14/15 160/100  04/18/15 172/100  01/06/15 140/80   General appearance: alert, no distress, WD/WN HEENT: normocephalic, sclerae anicteric, tender over right frontal and maxillary sinus, TMs pearly, nares with mucoid discharge, + erythema, pharynx normal Oral cavity: MMM, no lesions Neck: supple, no lymphadenopathy, no thyromegaly, no masses Heart: RRR, normal S1, S2,  no murmurs Lungs: CTA bilaterally, no wheezes, rhonchi, or rales Pulses: 2+ symmetric, upper and lower extremities, normal cap refill    Assessment: Encounter Diagnoses  Name Primary?  Marland Kitchen Asthmatic bronchitis, unspecified asthma severity, uncomplicated Yes  . Acute recurrent frontal sinusitis   . Coronary artery disease involving native coronary artery of native heart without angina pectoris   . BPH (benign prostatic hypertrophy)   . Essential hypertension       Plan: Begin Amoxicillin, rest, hydrate well. Stop OTC decongestants and they are running his BP up today.   Gave options for supportive care.  HTN, CAD - begin the new BP medication sent by cardiology  BPH - refilled Cialis, but advised f/u soon for physical.   Reviewed recent cardiology notes.   Cardiology gave 30 day supply in 05/2015.

## 2015-07-31 ENCOUNTER — Ambulatory Visit (INDEPENDENT_AMBULATORY_CARE_PROVIDER_SITE_OTHER): Payer: BLUE CROSS/BLUE SHIELD | Admitting: Family Medicine

## 2015-07-31 ENCOUNTER — Encounter: Payer: Self-pay | Admitting: Family Medicine

## 2015-07-31 VITALS — BP 130/82 | HR 72 | Temp 97.0°F | Ht 70.0 in | Wt 216.2 lb

## 2015-07-31 DIAGNOSIS — W57XXXA Bitten or stung by nonvenomous insect and other nonvenomous arthropods, initial encounter: Secondary | ICD-10-CM

## 2015-07-31 DIAGNOSIS — L03113 Cellulitis of right upper limb: Secondary | ICD-10-CM

## 2015-07-31 DIAGNOSIS — T148 Other injury of unspecified body region: Secondary | ICD-10-CM

## 2015-07-31 DIAGNOSIS — L259 Unspecified contact dermatitis, unspecified cause: Secondary | ICD-10-CM | POA: Diagnosis not present

## 2015-07-31 MED ORDER — TRIAMCINOLONE ACETONIDE 0.1 % EX CREA: TOPICAL_CREAM | CUTANEOUS | Status: DC

## 2015-07-31 MED ORDER — MUPIROCIN CALCIUM 2 % EX CREA: 1.0000 "application " | TOPICAL_CREAM | Freq: Three times a day (TID) | CUTANEOUS | Status: DC

## 2015-07-31 NOTE — Patient Instructions (Signed)
  Keep the skin clean. Use antihistamines such as zyrtec, claritin or benadryl as needed for itching (and the swelling of the skin). Use the mupirocin cream to the scabbed/open lesions, to prevent/treat infection. Use the topical steroid cream (triamcinolone) twice daily (use sparingly) as needed for itching.  Return next week for re-evaluation if any changes or concerns. Seek care if you develop significant swelling, pain, fever or other new concerns.

## 2015-07-31 NOTE — Progress Notes (Signed)
Chief Complaint  Patient presents with  . Rash    started Monday when he was working in flower bed that evening. Tues little bumps came up and started itching Wed and worsened. Has had 4 tick bites also over the last month, two of which were this weekend-one on his back and one on his right side.     3 days ago he was pulling weeds from girlfriend's flower bed.  He was wearing gloves, but short sleeves.  He hasn't had problems with allergy to poison ivy in the past.  He noticed some itching on the right forearm, near wrist,  right away, but the rest of the red bumps and rash on the right forearm became apparent the following day.  +itchy.  He has just 2 small bumps on the left arm.  He also has some on his lower legs and rear from earlier in the weekend.  These have not progressed or changed in any way.  He used neosporin last night, and then noticed some drainage, so worried about infection. He used a stridex pad once as well, which helped with itching.    He pulled off a tick on Sunday and one on Monday.  He checks his skin regularly, couldn't have been attached longer than 24 hours (R flank and belt-line).  Denies fever, chills, myalgias, arthralgias, other rashes, headache, chest pain, shortness of breath, GI complaints or other concerns  PHYSICAL EXAM:  BP 130/82 mmHg  Pulse 72  Temp(Src) 97 F (36.1 C) (Tympanic)  Ht 5\' 10"  (1.778 m)  Wt 216 lb 3.2 oz (98.068 kg)  BMI 31.02 kg/m2  Well developed, pleasant male in no distress  Skin: (area of tick bites) Right posterior waist line and right abdomen--small abrasion/open area to skin where tick bite was.  No surrounding rash or evidence of infection  Right forearm, anterior aspect--there are multiple papules, many of which are excoriated, with small halo of surounding erythema around the papule.  There is crusting. There is some mild soft tissue swelling and induration just around these papules.  No streaking or spreading of  erythema. In the upper forearm there is one linear raised area, that appears different than the rest of the papular rash.  ASSESSMENT/PLAN:  Contact dermatitis - plant exposure, +/- insect bites - Plan: triamcinolone cream (KENALOG) 0.1 %, mupirocin cream (BACTROBAN) 2 %  Cellulitis of right upper extremity - treat with bactroban to prevent infection, and treat possibly early cellulitis - Plan: mupirocin cream (BACTROBAN) 2 %  Tick bites - Discussed tick exposure/prevention, s/sx of illness for which he should seek evaluation.    Contact dermatitis (vs bug bites--less likely due to the delayed appearance of most of these papules). At least one area could be consistent with poison ivy (linear lesion).  Discussed tick exposure/prevention, s/sx of illness for which he should seek evaluation.   Keep the skin clean. Use antihistamines such as zyrtec, claritin or benadryl as needed for itching (and the swelling of the skin). Use the mupirocin cream to the scabbed/open lesions, to prevent/treat infection. Use the topical steroid cream (triamcinolone) twice daily (use sparingly) as needed for itching.  Return next week for re-evaluation if any changes or concerns. Seek care if you develop significant swelling, pain, fever or other new concerns.

## 2015-08-01 ENCOUNTER — Telehealth: Payer: Self-pay

## 2015-08-01 NOTE — Telephone Encounter (Signed)
Last saw Dr. Tomi Bamberger yesterday- sent to you in her absence.

## 2015-08-01 NOTE — Telephone Encounter (Signed)
Okay to switch?  

## 2015-08-01 NOTE — Telephone Encounter (Signed)
Spoke with Tammy at [facility]- she reports bactroban cream is $250, but the ointment is a lot cheaper. Tammy calling to get ok to change cream to ointment. Is this ok?   Tammy or Lilia Pro can be reached at [phone]. Victorino December

## 2015-08-01 NOTE — Telephone Encounter (Signed)
Talked with Lilia Pro and gave the okay for the ointment

## 2015-08-04 ENCOUNTER — Encounter: Payer: Self-pay | Admitting: Family Medicine

## 2015-08-04 ENCOUNTER — Ambulatory Visit (INDEPENDENT_AMBULATORY_CARE_PROVIDER_SITE_OTHER): Payer: BLUE CROSS/BLUE SHIELD | Admitting: Family Medicine

## 2015-08-04 VITALS — BP 130/80 | Wt 218.0 lb

## 2015-08-04 DIAGNOSIS — L259 Unspecified contact dermatitis, unspecified cause: Secondary | ICD-10-CM | POA: Diagnosis not present

## 2015-08-04 MED ORDER — TRIAMCINOLONE ACETONIDE 0.5 % EX CREA: 1.0000 "application " | TOPICAL_CREAM | Freq: Three times a day (TID) | CUTANEOUS | Status: DC

## 2015-08-04 MED ORDER — HYDROXYZINE HCL 10 MG PO TABS: 10.0000 mg | ORAL_TABLET | Freq: Three times a day (TID) | ORAL | Status: DC | PRN

## 2015-08-04 NOTE — Progress Notes (Signed)
   Subjective:    Patient ID: Todd Kelley, male    DOB: [birth year], 50 y.o.   MRN: [id number]  HPI He states that the rash on his arms is really not improved much. He has been using Aristocort which he states only lasts 1 or 2 hours.   Review of Systems     Objective:   Physical Exam Multiple erythematous linear and macular lesions are noted on the medial aspect of both forearms. Some of more vesicular in nature.       Assessment & Plan:  Contact dermatitis - Plan: triamcinolone cream (KENALOG) 0.5 %, hydrOXYzine (ATARAX/VISTARIL) 10 MG tablet Recommend cool compresses more potent steroid as well as Atarax to help with itching especially at night. At the end of the encounter he then mentioned the fact that his girlfriend is now pregnant with twins. He seems to be handling this well and has a very positive attitude towards this.

## 2015-08-04 NOTE — Patient Instructions (Signed)
Cold compresses on it as much as you want. Use a steroid as often as you need to sparingly. Continue on your Zyrtec and use Atarax especially at night

## 2015-10-14 ENCOUNTER — Other Ambulatory Visit: Payer: Self-pay

## 2015-10-14 MED ORDER — VALSARTAN 160 MG PO TABS: 160.0000 mg | ORAL_TABLET | Freq: Every day | ORAL | 6 refills | Status: DC

## 2015-11-18 ENCOUNTER — Ambulatory Visit (INDEPENDENT_AMBULATORY_CARE_PROVIDER_SITE_OTHER): Payer: BLUE CROSS/BLUE SHIELD | Admitting: Medical

## 2015-11-18 VITALS — BP 148/90 | HR 68 | Wt 205.4 lb

## 2015-11-18 DIAGNOSIS — F430 Acute stress reaction: Secondary | ICD-10-CM | POA: Diagnosis not present

## 2015-11-18 DIAGNOSIS — I2510 Atherosclerotic heart disease of native coronary artery without angina pectoris: Secondary | ICD-10-CM

## 2015-11-18 DIAGNOSIS — Z720 Tobacco use: Secondary | ICD-10-CM | POA: Diagnosis not present

## 2015-11-18 DIAGNOSIS — I10 Essential (primary) hypertension: Secondary | ICD-10-CM

## 2015-11-18 DIAGNOSIS — Z23 Encounter for immunization: Secondary | ICD-10-CM

## 2015-11-18 DIAGNOSIS — E039 Hypothyroidism, unspecified: Secondary | ICD-10-CM | POA: Diagnosis not present

## 2015-11-18 DIAGNOSIS — N400 Enlarged prostate without lower urinary tract symptoms: Secondary | ICD-10-CM

## 2015-11-18 DIAGNOSIS — N529 Male erectile dysfunction, unspecified: Secondary | ICD-10-CM

## 2015-11-18 DIAGNOSIS — K219 Gastro-esophageal reflux disease without esophagitis: Secondary | ICD-10-CM

## 2015-11-18 DIAGNOSIS — E785 Hyperlipidemia, unspecified: Secondary | ICD-10-CM

## 2015-11-18 MED ORDER — ATORVASTATIN CALCIUM 10 MG PO TABS: ORAL_TABLET | ORAL | 1 refills | Status: DC

## 2015-11-18 MED ORDER — TADALAFIL 5 MG PO TABS: ORAL_TABLET | ORAL | 11 refills | Status: DC

## 2015-11-18 MED ORDER — FENOFIBRATE 145 MG PO TABS: ORAL_TABLET | ORAL | 1 refills | Status: DC

## 2015-11-18 MED ORDER — TADALAFIL 5 MG PO TABS: ORAL_TABLET | ORAL | 2 refills | Status: DC

## 2015-11-18 MED ORDER — ALPRAZOLAM 0.25 MG PO TABS: 0.2500 mg | ORAL_TABLET | Freq: Every evening | ORAL | 0 refills | Status: AC | PRN

## 2015-11-18 MED ORDER — LEVOTHYROXINE SODIUM 175 MCG PO TABS: ORAL_TABLET | ORAL | 1 refills | Status: DC

## 2015-11-18 MED ORDER — VALSARTAN 160 MG PO TABS: 160.0000 mg | ORAL_TABLET | Freq: Every day | ORAL | 1 refills | Status: DC

## 2015-11-18 NOTE — Progress Notes (Signed)
Subjective: Chief Complaint  Patient presents with  . med check    med check- needs paper script for all meds. discuss meds for stress   Here for a few concerns.  Changing pharmacies as he is moving to [place] to move in with his girlfriend.  Needs printed scripts.    He uses [facility] for Cialis which is much cheaper, needs separate script for this.  Uses for BPH and ED.    He quit tobacco back in 04/2015, still vapes some.    Compliant with all medications, BPs at home range from normal to elevated at times.     Under a lot of stress.  Getting house ready to sell, moving in with girlfriend in [place], has twins expected in December, and ex -wife won't get her stuff out of his house although it has been in the attic 5 years.   Would like short term use of xanax for the next month until his house sells, feels like it could calm his nerves when he gets worked up.  Has used this short term in the past.   When upset or stressed, BPs tend to run high.  Past Medical History:  Diagnosis Date  . Acid reflux   . Coronary artery disease   . Dyslipidemia   . MI (myocardial infarction) ([facility]) july of 2000   stent  . Palpitations    hx of  . Thyroid disease    hypothyroidism   Current Outpatient Prescriptions on File Prior to Visit  Medication Sig Dispense Refill  . Albuterol Sulfate (PROAIR RESPICLICK) 123XX123 (90 Base) MCG/ACT AEPB Inhale 2 puffs into the lungs 4 (four) times daily as needed. 1 each 2  . aspirin 325 MG tablet Take 325 mg by mouth daily.    . Cetirizine HCl (ZYRTEC ALLERGY PO) Take by mouth.    . [DISCONTINUED] losartan (COZAAR) 100 MG tablet Take 1 tablet (100 mg total) by mouth daily. 90 tablet 3   No current facility-administered medications on file prior to visit.      ROS as in subjective    Objective: BP (!) 148/90   Pulse 68   Wt 205 lb 6.4 oz (93.2 kg)   BMI 29.47 kg/m   BP Readings from Last 3 Encounters:  11/18/15 (!) 148/90  08/04/15 130/80   07/31/15 130/82   General appearance: alert, no distress, WD/WN Neck: supple, no lymphadenopathy, no thyromegaly, no masses Heart: RRR, normal S1, S2, no murmurs Lungs: CTA bilaterally, no wheezes, rhonchi, or rales Ext: no edema Pulses: 2+ symmetric, upper and lower extremities, normal cap refill Psych: pleasant, good eye contact, answers questions appropriately    Assessment: Encounter Diagnoses  Name Primary?  . Acute stress reaction Yes  . Dyslipidemia   . Tobacco abuse   . Need for Tdap vaccination   . Gastroesophageal reflux disease without esophagitis   . Coronary artery disease involving native coronary artery of native heart without angina pectoris   . Erectile dysfunction, unspecified erectile dysfunction type   . Hypothyroidism (acquired)   . Essential hypertension   . BPH (benign prostatic hyperplasia)     Plan: Refilled medications Advised he return as planned soon for physical and fasting labs advised he stop tobacco C/t same medication Discuss his recent stressors.  Refilled xanax for prn short term use.  Counseled on dealing and coping with his current stressors He will get free flu shot at work soon Counseled on the Tdap (tetanus, diptheria, and acellular pertussis) vaccine.  Vaccine information sheet given. Tdap vaccine given after consent obtained.  Todd Kelley was seen today for med check.  Diagnoses and all orders for this visit:  Acute stress reaction  Dyslipidemia  Tobacco abuse  Need for Tdap vaccination -     Tdap vaccine greater than or equal to 7yo IM  Gastroesophageal reflux disease without esophagitis  Coronary artery disease involving native coronary artery of native heart without angina pectoris  Erectile dysfunction, unspecified erectile dysfunction type  Hypothyroidism (acquired)  Essential hypertension  BPH (benign prostatic hyperplasia)  Other orders -     atorvastatin (LIPITOR) 10 MG tablet; TAKE ONE (1) TABLET EACH DAY -      fenofibrate (TRICOR) 145 MG tablet; TAKE ONE (1) TABLET EACH DAY -     levothyroxine (SYNTHROID, LEVOTHROID) 175 MCG tablet; TAKE ONE (1) TABLET EACH DAY BEFORE BREAKFAST -     valsartan (DIOVAN) 160 MG tablet; Take 1 tablet (160 mg total) by mouth daily. -     ALPRAZolam (XANAX) 0.25 MG tablet; Take 1 tablet (0.25 mg total) by mouth at bedtime as needed for sleep. -     Discontinue: tadalafil (CIALIS) 5 MG tablet; Take 5 mg daily for BPH -     tadalafil (CIALIS) 5 MG tablet; Take 5 mg daily for BPH

## 2015-11-26 ENCOUNTER — Other Ambulatory Visit: Payer: BLUE CROSS/BLUE SHIELD

## 2015-11-26 ENCOUNTER — Other Ambulatory Visit: Payer: Self-pay | Admitting: Medical

## 2015-11-26 DIAGNOSIS — Z0000 Encounter for general adult medical examination without abnormal findings: Secondary | ICD-10-CM

## 2015-11-26 DIAGNOSIS — N529 Male erectile dysfunction, unspecified: Secondary | ICD-10-CM

## 2015-11-26 DIAGNOSIS — E785 Hyperlipidemia, unspecified: Secondary | ICD-10-CM

## 2015-11-26 DIAGNOSIS — E039 Hypothyroidism, unspecified: Secondary | ICD-10-CM

## 2015-11-26 DIAGNOSIS — N400 Enlarged prostate without lower urinary tract symptoms: Secondary | ICD-10-CM

## 2015-11-26 DIAGNOSIS — I10 Essential (primary) hypertension: Secondary | ICD-10-CM

## 2015-11-26 DIAGNOSIS — I2510 Atherosclerotic heart disease of native coronary artery without angina pectoris: Secondary | ICD-10-CM

## 2015-11-26 LAB — COMPREHENSIVE METABOLIC PANEL
ALBUMIN: 4 g/dL (ref 3.6–5.1)
ALK PHOS: 39 U/L — AB (ref 40–115)
ALT: 12 U/L (ref 9–46)
AST: 11 U/L (ref 10–35)
BUN: 12 mg/dL (ref 7–25)
CALCIUM: 9.1 mg/dL (ref 8.6–10.3)
CO2: 27 mmol/L (ref 20–31)
Chloride: 107 mmol/L (ref 98–110)
Creat: 1.05 mg/dL (ref 0.70–1.33)
GLUCOSE: 117 mg/dL — AB (ref 65–99)
POTASSIUM: 3.8 mmol/L (ref 3.5–5.3)
Sodium: 141 mmol/L (ref 135–146)
TOTAL PROTEIN: 6.1 g/dL (ref 6.1–8.1)
Total Bilirubin: 0.5 mg/dL (ref 0.2–1.2)

## 2015-11-26 LAB — CBC
HEMATOCRIT: 42 % (ref 38.5–50.0)
HEMOGLOBIN: 14.3 g/dL (ref 13.2–17.1)
MCH: 29.5 pg (ref 27.0–33.0)
MCHC: 34 g/dL (ref 32.0–36.0)
MCV: 86.6 fL (ref 80.0–100.0)
MPV: 10.5 fL (ref 7.5–12.5)
Platelets: 220 10*3/uL (ref 140–400)
RBC: 4.85 MIL/uL (ref 4.20–5.80)
RDW: 13.8 % (ref 11.0–15.0)
WBC: 6.3 10*3/uL (ref 4.0–10.5)

## 2015-11-26 LAB — LIPID PANEL
CHOL/HDL RATIO: 3.8 ratio (ref <=5.0)
CHOLESTEROL: 145 mg/dL (ref 125–200)
HDL: 38 mg/dL — AB (ref >=40)
LDL Cholesterol: 90 mg/dL (ref <130)
TRIGLYCERIDES: 84 mg/dL (ref <150)
VLDL: 17 mg/dL (ref <30)

## 2015-11-26 LAB — PSA: PSA: 2 ng/mL (ref <=4.0)

## 2015-11-26 LAB — T4, FREE: Free T4: 1.7 ng/dL (ref 0.8–1.8)

## 2015-11-26 LAB — TSH: TSH: 1.02 mIU/L (ref 0.40–4.50)

## 2015-11-27 LAB — HEMOGLOBIN A1C
Hgb A1c MFr Bld: 6 % — ABNORMAL HIGH (ref <5.7)
MEAN PLASMA GLUCOSE: 126 mg/dL

## 2015-11-27 LAB — MICROALBUMIN / CREATININE URINE RATIO
Creatinine, Urine: 92 mg/dL (ref 20–370)
MICROALB/CREAT RATIO: 3 ug/mg{creat} (ref <30)
Microalb, Ur: 0.3 mg/dL

## 2015-12-05 ENCOUNTER — Telehealth: Payer: Self-pay | Admitting: Urology

## 2015-12-05 ENCOUNTER — Ambulatory Visit (INDEPENDENT_AMBULATORY_CARE_PROVIDER_SITE_OTHER): Payer: BLUE CROSS/BLUE SHIELD | Admitting: Urology

## 2015-12-05 ENCOUNTER — Encounter: Payer: Self-pay | Admitting: Urology

## 2015-12-05 VITALS — BP 152/86 | HR 64 | Ht 70.0 in | Wt 198.0 lb

## 2015-12-05 DIAGNOSIS — Z3009 Encounter for other general counseling and advice on contraception: Secondary | ICD-10-CM | POA: Diagnosis not present

## 2015-12-05 DIAGNOSIS — I2510 Atherosclerotic heart disease of native coronary artery without angina pectoris: Secondary | ICD-10-CM | POA: Diagnosis not present

## 2015-12-05 MED ORDER — DIAZEPAM 10 MG PO TABS: 10.0000 mg | ORAL_TABLET | Freq: Once | ORAL | 0 refills | Status: AC

## 2015-12-05 NOTE — Telephone Encounter (Signed)
Yes that is OK  Mark Benecke Martinique MD, [facility]

## 2015-12-05 NOTE — Telephone Encounter (Signed)
Dr. Martinique,   This is a patient of your s/p CABG who appears to be doing well from a cardiac standpoint, last seen by you in 05/2015.  I would like to have him hold his ASA for vasectomy procedure for 7 days.  Are you OK with that?  Hollice Espy, MD

## 2015-12-05 NOTE — Patient Instructions (Signed)
                                                           Vasectomy                                    Patient Education and Post Care   If you were provided a sedative (Valium) you must have someone available to drive you home after your procedure. Avoid strenuous activity for 48 hours after your procedure. This includes any heavy lifting. You may return to work the next day, as long as no strenuous activity is required. Leave bandage in place for the next 24hrs. If you have any difficulty urinating remove you may remove bandage earlier. Then keep area clean and dry. You may shower after 24 hours. Do not take a bath or use a hot tub for five (5) days. You may apply ice or a cold pack to the scrotum as needed, for 10 minutes of every hours. (A bag of frozen peas will mold to the area). This may help reduce any pain or swelling. It may also be helpful to wear supportive underwear, such as briefs. Expect some mild pain, mild swelling of the scrotum and possible slight fluid leak from the site of the puncture. A gauze pad may be applied to the scrotum if there is any leakage from the puncture site. This drainage may continue for several days and is normal. You may continue your normal diet. After the procedure, you will be given 1-2 specimen cups. You need to do sperm checks at 6 and/or 12 weeks. Please bring the specimen back to our office to be sent out for analysis. You may resume having sex 1 week after procedure, if comfortable enough. Until you are told that you are sterile, it is essential that you use another form of birth control until otherwise notified by our office. Take Extra-Strength Tylenol, Motrin or Advil as needed for any pain or discomfort. Follow the package directions regarding dose. Stronger prescription pain medication is provided, but most people do not find that it is necessary. If you experience unusual or severe pain that is not relieved by pain medication, excessive bleeding  or drainage, excessive swelling or redness, foul odor or a fever over 101 F, please contact our office.  [facility] [address], [address] [place], [place] [address] [phone]     

## 2015-12-05 NOTE — Progress Notes (Signed)
12/05/2015 4:39 PM   Todd Kelley 09-18-1965 [id number]  Referring provider: Denita Lung, MD [address] [place], [place] [address]  Chief Complaint  Patient presents with  . VAS Consult    New Patient    HPI:  50 year old male who presents today for vasectomy evaluation.  He is married and has adult children ages 60 and 5. He and his wife are also currently pregnant with boy/ girl  twins due in December. His wife is 20 and this pregnancy was somewhat unexpected.  He and his wife are agreeable to vasectomy as a desire no further biological children after these.  He has no history of testicular pain, discomfort, or surgery.  He does have a history of CAD status post cardiac catheter followed by CABG. He does take an aspirin daily. His cardiologist is Dr. Peter Martinique and was last seen in 05/2015.   PMH: Past Medical History:  Diagnosis Date  . Acid reflux   . Coronary artery disease   . Dyslipidemia   . MI (myocardial infarction) july of 2000   stent  . Palpitations    hx of  . Thyroid disease    hypothyroidism    Surgical History: Past Surgical History:  Procedure Laterality Date  . APPENDECTOMY  2001  . CARDIAC CATHETERIZATION  2000   stent  . CAROTID ENDARTERECTOMY    . CORONARY ANGIOPLASTY  jan. 2001  . CORONARY ANGIOPLASTY WITH STENT PLACEMENT  june 2001   stent  . CORONARY ARTERY BYPASS GRAFT  dec. 2002   LIMA graft to LAD,RIMA graft to the intermediate branch and saphenous vein graft  to the posterior lateral and posterior descending branches of the right coronary    Home Medications:    Medication List       Accurate as of 12/05/15  4:39 PM. Always use your most recent med list.          Albuterol Sulfate 108 (90 Base) MCG/ACT Aepb Commonly known as:  PROAIR RESPICLICK Inhale 2 puffs into the lungs 4 (four) times daily as needed.   ALPRAZolam 0.25 MG tablet Commonly known as:  XANAX Take 1 tablet (0.25 mg total) by mouth at  bedtime as needed for sleep.   aspirin 325 MG tablet Take 325 mg by mouth daily.   atorvastatin 10 MG tablet Commonly known as:  LIPITOR TAKE ONE (1) TABLET EACH DAY   diazepam 10 MG tablet Commonly known as:  VALIUM Take 1 tablet (10 mg total) by mouth once.   fenofibrate 145 MG tablet Commonly known as:  TRICOR TAKE ONE (1) TABLET EACH DAY   levothyroxine 175 MCG tablet Commonly known as:  SYNTHROID, LEVOTHROID TAKE ONE (1) TABLET EACH DAY BEFORE BREAKFAST   tadalafil 5 MG tablet Commonly known as:  CIALIS Take 5 mg daily for BPH   valsartan 160 MG tablet Commonly known as:  DIOVAN Take 1 tablet (160 mg total) by mouth daily.   ZYRTEC ALLERGY PO Take by mouth.       Allergies:  Allergies  Allergen Reactions  . Tetracycline     REACTION: Nervous,shakey    Family History: Family History  Problem Relation Age of Onset  . Liver cancer Mother   . Heart disease Father     cabg   . Prostate cancer Neg Hx   . Kidney cancer Neg Hx     Social History:  reports that he quit smoking about 7 months ago. His smoking use included Cigarettes. He  has a 5.00 pack-year smoking history. He has never used smokeless tobacco. He reports that he drinks alcohol. He reports that he does not use drugs.  ROS: UROLOGY Frequent Urination?: No Hard to postpone urination?: No Burning/pain with urination?: No Get up at night to urinate?: No Leakage of urine?: No Urine stream starts and stops?: No Trouble starting stream?: No Do you have to strain to urinate?: No Blood in urine?: No Urinary tract infection?: No Sexually transmitted disease?: No Injury to kidneys or bladder?: No Painful intercourse?: No Weak stream?: No Erection problems?: No Penile pain?: No  Gastrointestinal Nausea?: No Vomiting?: No Indigestion/heartburn?: Yes Diarrhea?: No Constipation?: No  Constitutional Fever: No Night sweats?: No Weight loss?: No Fatigue?: No  Skin Skin rash/lesions?:  No Itching?: No  Eyes Blurred vision?: No Double vision?: No  Ears/Nose/Throat Sore throat?: No Sinus problems?: Yes  Hematologic/Lymphatic Swollen glands?: No Easy bruising?: No  Cardiovascular Leg swelling?: No Chest pain?: No  Respiratory Cough?: Yes Shortness of breath?: No  Endocrine Excessive thirst?: No  Musculoskeletal Back pain?: Yes Joint pain?: No  Neurological Headaches?: No Dizziness?: No  Psychologic Depression?: No Anxiety?: No  Physical Exam: BP (!) 152/86   Pulse 64   Ht 5\' 10"  (1.778 m)   Wt 198 lb (89.8 kg)   BMI 28.41 kg/m   Constitutional:  Alert and oriented, No acute distress. HEENT: [place] AT, moist mucus membranes.  Trachea midline, no masses. Cardiovascular: No clubbing, cyanosis, or edema. Respiratory: Normal respiratory effort, no increased work of breathing. GI: Abdomen is soft, nontender, nondistended, no abdominal masses.  No obvious inguinal hernias. GU: No CVA tenderness. Circumcised phallus. Testicles descended bilaterally. No testicular masses or tenderness. Left cord easily palpable. Right thickened cord but ultimately able to palpate vas. Skin: No rashes, bruises or suspicious lesions. Lymph: No inguinal adenopathy. Neurologic: Grossly intact, no focal deficits, moving all 4 extremities. Psychiatric: Normal mood and affect.  Laboratory Data: Lab Results  Component Value Date   WBC 6.3 11/26/2015   HGB 14.3 11/26/2015   HCT 42.0 11/26/2015   MCV 86.6 11/26/2015   PLT 220 11/26/2015    Lab Results  Component Value Date   CREATININE 1.05 11/26/2015    Lab Results  Component Value Date   PSA 2.0 11/26/2015    Lab Results  Component Value Date   HGBA1C 6.0 (H) 11/26/2015    Assessment & Plan:    1. Vasectomy evaluation Today, we discussed what the vas deferens is, where it is located, and its function. We reviewed the procedure for vasectomy, it's risks, benefits, alternatives, and likelihood of achieving  his goals. We discussed in detail the procedure, complications, and recovery as well as the need for clearance prior to unprotected intercourse. We discussed that vasectomy does not protect against sexually transmitted diseases. We discussed that this procedure does not result in immediate sterility and that they would need to use other forms of birth control until he has been cleared with negative postvasectomy semen analyses. I explained that the procedure is considered to be permanent and that attempts at reversal have varying degrees of success. These options include vasectomy reversal, sperm retrieval, and in vitro fertilization; these can be very expensive. We discussed the chance of postvasectomy pain syndrome which occurs in less than 5% of patients. I explained to the patient that there is no treatment to resolve this chronic pain, and that if it developed I would not be able to help resolve the issue, but that surgery is  generally not needed for correction. I explained there have even been reports of systemic like illness associated with this chronic pain, and that there was no good cure. I explained that vasectomy it is not a 100% reliable form of birth control, and the risk of pregnancy after vasectomy is approximately 1 in 2000 men who had a negative postvasectomy semen analysis or rare non-motile sperm. I explained that repeat vasectomy was necessary in less than 1% of vasectomy procedures when employing the type of technique that I use. I explained that he should refrain from ejaculation for approximately one week following vasectomy. I explained that there are other options for birth control which are permanent and non-permanent; we discussed these. I explained the rates of surgical complications, such as symptomatic hematoma or infection, are low (1-2%) and vary with the surgeon's experience and criteria used to diagnose the complication.  The patient had the opportunity to ask questions to his  stated satisfaction. He voiced understanding of the above factors and stated that he has read all the information provided to him and the packets and informed consent  He would like to proceed as planned. Consent forms signed today.  Valium 10 mg given for preprocedure anxiety. Patient confirmed that he will have a driver.  2. Coronary artery disease involving native coronary artery of native heart without angina pectoris We'll need to hold aspirin for one week prior to the procedure. Message sent to cardiologist, awaiting response   Return for schedule vasectomy.  Hollice Espy, MD  [facility] [address], [place], [place] [address] [phone]

## 2015-12-08 NOTE — Telephone Encounter (Signed)
Returned call to patient.No answer.LMTC. 

## 2015-12-09 NOTE — Telephone Encounter (Signed)
Patient notified ok to stop ASA, per previous note

## 2015-12-18 ENCOUNTER — Ambulatory Visit (INDEPENDENT_AMBULATORY_CARE_PROVIDER_SITE_OTHER): Payer: BLUE CROSS/BLUE SHIELD | Admitting: Medical

## 2015-12-18 ENCOUNTER — Encounter: Payer: Self-pay | Admitting: Medical

## 2015-12-18 ENCOUNTER — Encounter: Payer: Self-pay | Admitting: Internal Medicine

## 2015-12-18 VITALS — BP 146/98 | HR 64 | Temp 98.2°F | Ht 70.0 in | Wt 208.4 lb

## 2015-12-18 DIAGNOSIS — I10 Essential (primary) hypertension: Secondary | ICD-10-CM | POA: Diagnosis not present

## 2015-12-18 DIAGNOSIS — I2510 Atherosclerotic heart disease of native coronary artery without angina pectoris: Secondary | ICD-10-CM

## 2015-12-18 DIAGNOSIS — T466X5A Adverse effect of antihyperlipidemic and antiarteriosclerotic drugs, initial encounter: Secondary | ICD-10-CM

## 2015-12-18 DIAGNOSIS — Z951 Presence of aortocoronary bypass graft: Secondary | ICD-10-CM | POA: Diagnosis not present

## 2015-12-18 DIAGNOSIS — Z1211 Encounter for screening for malignant neoplasm of colon: Secondary | ICD-10-CM

## 2015-12-18 DIAGNOSIS — Z720 Tobacco use: Secondary | ICD-10-CM | POA: Diagnosis not present

## 2015-12-18 DIAGNOSIS — N529 Male erectile dysfunction, unspecified: Secondary | ICD-10-CM | POA: Diagnosis not present

## 2015-12-18 DIAGNOSIS — Z0000 Encounter for general adult medical examination without abnormal findings: Secondary | ICD-10-CM | POA: Diagnosis not present

## 2015-12-18 DIAGNOSIS — E785 Hyperlipidemia, unspecified: Secondary | ICD-10-CM

## 2015-12-18 DIAGNOSIS — Z125 Encounter for screening for malignant neoplasm of prostate: Secondary | ICD-10-CM

## 2015-12-18 DIAGNOSIS — N400 Enlarged prostate without lower urinary tract symptoms: Secondary | ICD-10-CM

## 2015-12-18 DIAGNOSIS — E039 Hypothyroidism, unspecified: Secondary | ICD-10-CM | POA: Diagnosis not present

## 2015-12-18 DIAGNOSIS — I252 Old myocardial infarction: Secondary | ICD-10-CM

## 2015-12-18 DIAGNOSIS — R7301 Impaired fasting glucose: Secondary | ICD-10-CM

## 2015-12-18 DIAGNOSIS — M609 Myositis, unspecified: Secondary | ICD-10-CM | POA: Insufficient documentation

## 2015-12-18 NOTE — Progress Notes (Signed)
Subjective:   HPI  Todd Kelley is a 50 y.o. male who presents for a complete physical.  Medical care team includes:  Eye doctor, dentist  Dr. Peter Martinique, cardiology  Wyatt Haste, MD with Dorothea Ogle, PA-C here for primary care   Concerns: Prior myalgias with stains including Crestor, Zocor, Lipitor, at higher doses.  Reviewed their medical, surgical, family, social, medication, and allergy history and updated chart as appropriate.  Past Medical History:  Diagnosis Date  . Acid reflux   . Allergic asthma   . Coronary artery disease   . Dyslipidemia   . Impaired fasting blood sugar   . MI (myocardial infarction) july of 2000   stent  . Palpitations    hx of  . Thyroid disease    hypothyroidism  . Tobacco use     Past Surgical History:  Procedure Laterality Date  . APPENDECTOMY  2001  . CARDIAC CATHETERIZATION  2000   stent  . CORONARY ANGIOPLASTY  jan. 2001  . CORONARY ANGIOPLASTY WITH STENT PLACEMENT  june 2001   stent  . CORONARY ARTERY BYPASS GRAFT  dec. 2002   LIMA graft to LAD,RIMA graft to the intermediate branch and saphenous vein graft  to the posterior lateral and posterior descending branches of the right coronary    Social History   Social History  . Marital status: Legally Separated    Spouse name: N/A  . Number of children: 3  . Years of education: N/A   Occupational History  . [employer]    Social History Main Topics  . Smoking status: Former Smoker    Packs/day: 0.50    Years: 10.00    Types: Cigarettes    Quit date: 04/08/2015  . Smokeless tobacco: Never Used  . Alcohol use 3.0 oz/week    5 Cans of beer per week     Comment: occ.  . Drug use: No  . Sexual activity: Not on file   Other Topics Concern  . Not on file   Social History Narrative   Lives with girlfriend, 3 dogs, 3 children, and 2 twins on the way as of 11/2015.  Exercise - physical labor remodeling house, goes to the gym.  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  As of 11/2015    Family History  Problem Relation Age of Onset  . Liver cancer Mother   . Heart disease Father     cabg   . Prostate cancer Neg Hx   . Kidney cancer Neg Hx      Current Outpatient Prescriptions:  .  Albuterol Sulfate (PROAIR RESPICLICK) 123XX123 (90 Base) MCG/ACT AEPB, Inhale 2 puffs into the lungs 4 (four) times daily as needed., Disp: 1 each, Rfl: 2 .  ALPRAZolam (XANAX) 0.25 MG tablet, Take 1 tablet (0.25 mg total) by mouth at bedtime as needed for sleep., Disp: 30 tablet, Rfl: 0 .  aspirin 325 MG tablet, Take 325 mg by mouth daily., Disp: , Rfl:  .  atorvastatin (LIPITOR) 10 MG tablet, TAKE ONE (1) TABLET EACH DAY, Disp: 90 tablet, Rfl: 1 .  Cetirizine HCl (ZYRTEC ALLERGY PO), Take by mouth., Disp: , Rfl:  .  fenofibrate (TRICOR) 145 MG tablet, TAKE ONE (1) TABLET EACH DAY, Disp: 90 tablet, Rfl: 1 .  levothyroxine (SYNTHROID, LEVOTHROID) 175 MCG tablet, TAKE ONE (1) TABLET EACH DAY BEFORE BREAKFAST, Disp: 90 tablet, Rfl: 1 .  tadalafil (CIALIS) 5 MG tablet, Take 5 mg daily for BPH, Disp: 30 tablet,  Rfl: 11 .  valsartan (DIOVAN) 160 MG tablet, Take 1 tablet (160 mg total) by mouth daily., Disp: 90 tablet, Rfl: 1  Allergies  Allergen Reactions  . Statins     Myalgias other than low dose  . Tetracycline     REACTION: Nervous,shakey    Review of Systems Constitutional: -fever, -chills, -sweats, -unexpected weight change, -decreased appetite, -fatigue Allergy: -sneezing, -itching, -congestion Dermatology: -changing moles, --rash, -lumps ENT: -runny nose, -ear pain, -sore throat, -hoarseness, -sinus pain, -teeth pain, - ringing in ears, -hearing loss, -nosebleeds Cardiology: -chest pain, -palpitations, -swelling, -difficulty breathing when lying flat, -waking up short of breath Respiratory: -cough, -shortness of breath, -difficulty breathing with exercise or exertion, -wheezing, -coughing up blood Gastroenterology: -abdominal pain,  -nausea, -vomiting, -diarrhea, -constipation, -blood in stool, -changes in bowel movement, -difficulty swallowing or eating Hematology: -bleeding, -bruising  Musculoskeletal: -joint aches, -muscle aches, -joint swelling, -back pain, -neck pain, -cramping, -changes in gait Ophthalmology: denies vision changes, eye redness, itching, discharge Urology: -burning with urination, -difficulty urinating, -blood in urine, -urinary frequency, -urgency, -incontinence Neurology: -headache, -weakness, -tingling, -numbness, -memory loss, -falls, -dizziness Psychology: -depressed mood, -agitation, -sleep problems     Objective:   Physical Exam  BP (!) 146/98 (BP Location: Right Arm, Patient Position: Sitting, Cuff Size: Large)   Pulse 64   Temp 98.2 F (36.8 C) (Oral)   Ht 5\' 10"  (1.778 m)   Wt 208 lb 6.4 oz (94.5 kg)   SpO2 98%   BMI 29.90 kg/m   General appearance: alert, no distress, WD/WN, white male Skin:scattered macules, now worrisome lesions HEENT: normocephalic, conjunctiva/corneas normal, sclerae anicteric, PERRLA, EOMi, nares patent, no discharge or erythema, pharynx normal Oral cavity: MMM, tongue normal, teeth in good repair Neck: supple, no lymphadenopathy, no thyromegaly, no masses, normal ROM, no bruits Chest: vertical CABG scar, non tender, normal shape and expansion Heart: RRR, normal S1, S2, no murmurs Lungs: CTA bilaterally, no wheezes, rhonchi, or rales Abdomen: +bs, soft, abdominal right lower surgical scars, non tender, non distended, no masses, no hepatomegaly, no splenomegaly, no bruits Back: non tender, normal ROM, no scoliosis Musculoskeletal: upper extremities non tender, no obvious deformity, normal ROM throughout, lower extremities non tender, no obvious deformity, normal ROM throughout Extremities: no edema, no cyanosis, no clubbing Pulses: 2+ symmetric, upper and lower extremities, normal cap refill Neurological: alert, oriented x 3, CN2-12 intact, strength normal  upper extremities and lower extremities, sensation normal throughout, DTRs 2+ throughout, no cerebellar signs, gait normal Psychiatric: normal affect, behavior normal, pleasant  GU: normal male external genitalia,circumcised, non tender, no masses, no hernia, no lymphadenopathy Rectal: declined   Assessment and Plan :    Encounter Diagnoses  Name Primary?  . Encounter for health maintenance examination in adult Yes  . Coronary artery disease involving native coronary artery of native heart without angina pectoris   . History of MI (myocardial infarction)   . Essential hypertension   . Hypothyroidism (acquired)   . Erectile dysfunction, unspecified erectile dysfunction type   . Benign prostatic hyperplasia, unspecified whether lower urinary tract symptoms present   . Dyslipidemia   . S/P CABG (coronary artery bypass graft)   . Tobacco abuse   . Special screening for malignant neoplasms, colon   . Screening for prostate cancer   . Impaired fasting blood sugar   . Statin-induced myositis     Physical exam - discussed healthy lifestyle, diet, exercise, preventative care, vaccinations, and addressed their concerns.   Reviewed the recent labs where he came  in recently for fasting labs Impaired glucose - discussed recent labs, recommendations for diet, excesses See your eye doctor yearly for routine vision care. See your dentist yearly for routine dental care including hygiene visits twice yearly. CAD, MI, hyperlipidemia , HTN - discussed his diagnoses, discussed current treatment.  Discussed possibly switching to Crestor or adding PSK9 medication to lower LDL.  He will consider Advised he stop all tobacco including vaping. hypothyroid- c/t same medication Referral for colonoscopy for after first of the year.   Follow-up for colonoscopy

## 2015-12-19 ENCOUNTER — Encounter: Payer: Self-pay | Admitting: Urology

## 2015-12-19 ENCOUNTER — Ambulatory Visit (INDEPENDENT_AMBULATORY_CARE_PROVIDER_SITE_OTHER): Payer: BLUE CROSS/BLUE SHIELD | Admitting: Urology

## 2015-12-19 VITALS — BP 144/84 | HR 51 | Ht 70.0 in | Wt 203.0 lb

## 2015-12-19 DIAGNOSIS — Z302 Encounter for sterilization: Secondary | ICD-10-CM

## 2015-12-19 NOTE — Patient Instructions (Signed)
                                                           Vasectomy                                    Patient Education and Post Care   If you were provided a sedative (Valium) you must have someone available to drive you home after your procedure. Avoid strenuous activity for 48 hours after your procedure. This includes any heavy lifting. You may return to work the next day, as long as no strenuous activity is required. Leave bandage in place for the next 24hrs. If you have any difficulty urinating remove you may remove bandage earlier. Then keep area clean and dry. You may shower after 24 hours. Do not take a bath or use a hot tub for five (5) days. You may apply ice or a cold pack to the scrotum as needed, for 10 minutes of every hours. (A bag of frozen peas will mold to the area). This may help reduce any pain or swelling. It may also be helpful to wear supportive underwear, such as briefs. Expect some mild pain, mild swelling of the scrotum and possible slight fluid leak from the site of the puncture. A gauze pad may be applied to the scrotum if there is any leakage from the puncture site. This drainage may continue for several days and is normal. You may continue your normal diet. After the procedure, you will be given 1-2 specimen cups. You need to do sperm checks at 6 and/or 12 weeks. Please bring the specimen back to our office to be sent out for analysis. You may resume having sex 1 week after procedure, if comfortable enough. Until you are told that you are sterile, it is essential that you use another form of birth control until otherwise notified by our office. Take Extra-Strength Tylenol, Motrin or Advil as needed for any pain or discomfort. Follow the package directions regarding dose. Stronger prescription pain medication is provided, but most people do not find that it is necessary. If you experience unusual or severe pain that is not relieved by pain medication, excessive bleeding  or drainage, excessive swelling or redness, foul odor or a fever over 101 F, please contact our office.  [facility] [address], [address] [place], [place] [address] [phone]     

## 2015-12-19 NOTE — Progress Notes (Signed)
12/19/15  CC:  Chief Complaint  Patient presents with  . VAS    HPI: 50 year old male who presents today for vasectomy. He is accompanied today by his pregnant wife. He has several other biological children and desired no further pregnancies. All of his questions were previously answered. He signed consent prior to today's procedure.  Blood pressure (!) 144/84, pulse (!) 51, height 5\' 10"  (1.778 m), weight 203 lb (92.1 kg). NED. A&Ox3.   No respiratory distress   Abd soft, NT, ND Normal external genitalia with patent urethral meatus   Bilateral Vasectomy Procedure  Pre-Procedure: - Patient's scrotum was prepped and draped for vasectomy. - The vas was palpated through the scrotal skin on the left. - 1% Xylocaine was injected into the skin and surrounding tissue for placement  - In a similar manner, the vas on the right was identified, anesthetized, and stabilized.  Procedure: - 11 blade was used to make very small incisions over the overlying skin - The left vas was isolated and brought up through the incision exposing that structure. - Bleeding points were cauterized as they occurred. - The vas was free from the surrounding structures and brought to the view. - A segment was positioned for placement with a hemostat. - A second hemostat was placed and a small segment between the two hemostats and was removed for inspection. - Each end of the transected vas lumen was fulgurated/ obliterated using needlepoint electrocautery -A fascial interposition was performed on testicular end of the vas using #3-0 chromic suture -The same procedure was performed on the right. - A single suture of #3-0 chromic catgut was used to close each lateral scrotal skin incision - A dressing was applied.  Post-Procedure: - Patient was instructed in care of the operative area - A specimen is to be delivered in 12 weeks   -Another form of contraception is to be used until post vasectomy semen  analysis  Hollice Espy, MD

## 2016-01-19 ENCOUNTER — Encounter: Payer: Self-pay | Admitting: Medical

## 2016-02-10 ENCOUNTER — Ambulatory Visit (INDEPENDENT_AMBULATORY_CARE_PROVIDER_SITE_OTHER): Payer: BLUE CROSS/BLUE SHIELD | Admitting: Orthopaedic Surgery

## 2016-04-22 ENCOUNTER — Other Ambulatory Visit: Payer: Self-pay | Admitting: Cardiology

## 2016-04-22 DIAGNOSIS — I2510 Atherosclerotic heart disease of native coronary artery without angina pectoris: Secondary | ICD-10-CM

## 2016-04-22 DIAGNOSIS — J45909 Unspecified asthma, uncomplicated: Secondary | ICD-10-CM

## 2016-04-22 NOTE — Telephone Encounter (Signed)
He should get this filled by primary care  Evanny Ellerbe Martinique MD, [facility]

## 2016-04-23 ENCOUNTER — Other Ambulatory Visit: Payer: Self-pay | Admitting: Family Medicine

## 2016-04-23 DIAGNOSIS — J45909 Unspecified asthma, uncomplicated: Secondary | ICD-10-CM

## 2016-06-14 ENCOUNTER — Other Ambulatory Visit: Payer: Self-pay

## 2016-06-14 ENCOUNTER — Telehealth: Payer: Self-pay

## 2016-06-14 MED ORDER — ATORVASTATIN CALCIUM 10 MG PO TABS: ORAL_TABLET | ORAL | 1 refills | Status: DC

## 2016-06-14 MED ORDER — FENOFIBRATE 145 MG PO TABS: ORAL_TABLET | ORAL | 1 refills | Status: DC

## 2016-06-14 MED ORDER — VALSARTAN 160 MG PO TABS: 160.0000 mg | ORAL_TABLET | Freq: Every day | ORAL | 1 refills | Status: DC

## 2016-06-14 MED ORDER — LEVOTHYROXINE SODIUM 175 MCG PO TABS: ORAL_TABLET | ORAL | 1 refills | Status: DC

## 2016-06-14 NOTE — Telephone Encounter (Signed)
Pt needs tricor, synthroid, valsartan, atorvastatin sent to [facility]. Todd Kelley

## 2016-06-15 ENCOUNTER — Other Ambulatory Visit: Payer: Self-pay

## 2016-06-15 MED ORDER — LEVOTHYROXINE SODIUM 175 MCG PO TABS: ORAL_TABLET | ORAL | 1 refills | Status: DC

## 2016-06-15 MED ORDER — VALSARTAN 160 MG PO TABS: 160.0000 mg | ORAL_TABLET | Freq: Every day | ORAL | 1 refills | Status: DC

## 2016-06-15 MED ORDER — ATORVASTATIN CALCIUM 10 MG PO TABS: ORAL_TABLET | ORAL | 1 refills | Status: DC

## 2016-06-15 MED ORDER — FENOFIBRATE 145 MG PO TABS: ORAL_TABLET | ORAL | 1 refills | Status: DC

## 2016-06-15 NOTE — Telephone Encounter (Signed)
Done already

## 2016-09-22 ENCOUNTER — Telehealth: Payer: Self-pay | Admitting: Family Medicine

## 2016-09-22 MED ORDER — IRBESARTAN 150 MG PO TABS: 150.0000 mg | ORAL_TABLET | Freq: Every day | ORAL | 0 refills | Status: DC

## 2016-09-22 NOTE — Telephone Encounter (Signed)
Let him know that we had to switch drugs due to the recall and have him schedule for a blood pressure check in one month

## 2016-09-22 NOTE — Telephone Encounter (Signed)
Left word for word message on cell VM

## 2016-09-22 NOTE — Telephone Encounter (Signed)
Pharmacy called and states that Valsartan has been recalled they want to want to know if you want to change it to Micardis or Avapro pt uses [facility], [place] can be reached at [phone]

## 2016-10-01 ENCOUNTER — Encounter: Payer: Self-pay | Admitting: Medical

## 2016-10-01 ENCOUNTER — Ambulatory Visit (INDEPENDENT_AMBULATORY_CARE_PROVIDER_SITE_OTHER): Payer: BLUE CROSS/BLUE SHIELD | Admitting: Medical

## 2016-10-01 VITALS — BP 130/88 | HR 60 | Wt 214.0 lb

## 2016-10-01 DIAGNOSIS — F419 Anxiety disorder, unspecified: Secondary | ICD-10-CM | POA: Diagnosis not present

## 2016-10-01 DIAGNOSIS — F524 Premature ejaculation: Secondary | ICD-10-CM

## 2016-10-01 DIAGNOSIS — G4700 Insomnia, unspecified: Secondary | ICD-10-CM | POA: Diagnosis not present

## 2016-10-01 DIAGNOSIS — L03012 Cellulitis of left finger: Secondary | ICD-10-CM | POA: Diagnosis not present

## 2016-10-01 MED ORDER — AMOXICILLIN-POT CLAVULANATE 875-125 MG PO TABS: 1.0000 | ORAL_TABLET | Freq: Two times a day (BID) | ORAL | 0 refills | Status: DC

## 2016-10-01 MED ORDER — SILVER SULFADIAZINE 1 % EX CREA: 1.0000 "application " | TOPICAL_CREAM | Freq: Every day | CUTANEOUS | 0 refills | Status: DC

## 2016-10-01 MED ORDER — ESCITALOPRAM OXALATE 10 MG PO TABS: 10.0000 mg | ORAL_TABLET | Freq: Every day | ORAL | 1 refills | Status: DC

## 2016-10-01 NOTE — Progress Notes (Signed)
Subjective: Chief Complaint  Patient presents with  . bump    bump on his finger , possible infection  come up 1 week having pain on    Here for several issues.   He notes last week he had bump on left index finger.  He mashed it, but within days got swollen and red.  He heated up razor blade to incise the area.  It has remained red, painful, swollen, and has some pus drainage.    He is keeping it clean, using topica antibiotic ointment.  No fever, no nausea, no vomiting.  Also had vasectomy in October 2017.  Lately having premature ejaculation for months.  Wife upset.   In the past on Lexapro didn't have this problem.   He also notes lexapro helped with sleep and anxiety.  Wants to try this again.    Denies urinary issues otherwise, no BPH symptoms.  No other aggravating or relieving factors. No other complaint.  Past Medical History:  Diagnosis Date  . Acid reflux   . Allergic asthma   . Coronary artery disease   . Dyslipidemia   . Impaired fasting blood sugar   . MI (myocardial infarction) ([place]) july of 2000   stent  . Palpitations    hx of  . Thyroid disease    hypothyroidism  . Tobacco use    Current Outpatient Prescriptions on File Prior to Visit  Medication Sig Dispense Refill  . aspirin 325 MG tablet Take 325 mg by mouth daily.    Marland Kitchen atorvastatin (LIPITOR) 10 MG tablet TAKE ONE (1) TABLET EACH DAY 90 tablet 1  . Cetirizine HCl (ZYRTEC ALLERGY PO) Take by mouth.    . fenofibrate (TRICOR) 145 MG tablet TAKE ONE (1) TABLET EACH DAY 90 tablet 1  . irbesartan (AVAPRO) 150 MG tablet Take 1 tablet (150 mg total) by mouth daily. 90 tablet 0  . levothyroxine (SYNTHROID, LEVOTHROID) 175 MCG tablet TAKE ONE (1) TABLET EACH DAY BEFORE BREAKFAST 90 tablet 1  . PROAIR RESPICLICK 921 (90 Base) MCG/ACT AEPB INHALE TWO PUFFS INTO THE LUNGS FOUR TIMES DAILY AS NEEDED 1 each 0  . tadalafil (CIALIS) 5 MG tablet Take 5 mg daily for BPH 30 tablet 11  . [DISCONTINUED] losartan (COZAAR) 100 MG  tablet Take 1 tablet (100 mg total) by mouth daily. 90 tablet 3   No current facility-administered medications on file prior to visit.       Objective BP 130/88   Pulse 60   Wt 214 lb (97.1 kg)   SpO2 97%   BMI 30.71 kg/m   Wt Readings from Last 3 Encounters:  10/01/16 214 lb (97.1 kg)  12/19/15 203 lb (92.1 kg)  12/18/15 208 lb 6.4 oz (94.5 kg)   Gen: wd, wn, nad Skin: left lateral index finger proximal phalanx, with 1.5cm diameter wound with raised edges, with pink/red coloration, slightly serosangous drainage, tender and slightly swollen in same area Fingers neurovascularly intact    Assessment: Encounter Diagnoses  Name Primary?  . Cellulitis of finger of left hand Yes  . Premature ejaculation   . Insomnia, unspecified type   . Anxiety      Plan: cellulitis of finger - begin augmentin, keep clean and dry, can use silvadene topical.  Discussed signs that would prompt visit to emergency dept.  F/u 1wk  Premature ejaculation - discussed possible causes, discussed possible remedies such as trying on back with sexual intercourse, use hold and squeeze.   Begin back on lexapro  for anxiety, sleep, premature ejaculation.   Carey was seen today for bump.  Diagnoses and all orders for this visit:  Cellulitis of finger of left hand  Premature ejaculation  Insomnia, unspecified type  Anxiety  Other orders -     amoxicillin-clavulanate (AUGMENTIN) 875-125 MG tablet; Take 1 tablet by mouth 2 (two) times daily. -     silver sulfADIAZINE (SILVADENE) 1 % cream; Apply 1 application topically daily. -     escitalopram (LEXAPRO) 10 MG tablet; Take 1 tablet (10 mg total) by mouth daily.

## 2016-12-01 ENCOUNTER — Telehealth: Payer: Self-pay | Admitting: Medical

## 2016-12-01 ENCOUNTER — Other Ambulatory Visit: Payer: Self-pay

## 2016-12-01 MED ORDER — TADALAFIL 5 MG PO TABS: ORAL_TABLET | ORAL | 3 refills | Status: DC

## 2016-12-01 NOTE — Telephone Encounter (Signed)
Send in 30 day on Cialis and get in for f/u from last visit and med changes

## 2016-12-01 NOTE — Telephone Encounter (Signed)
Pt called for refills of Cialis. He takes this for BPH and to help with his bathroom issues. He is completely out and needs ASAP. Please send to [facility].

## 2016-12-02 NOTE — Telephone Encounter (Signed)
Sent in refill to pharmacy.

## 2016-12-06 ENCOUNTER — Telehealth: Payer: Self-pay | Admitting: Family Medicine

## 2016-12-06 ENCOUNTER — Other Ambulatory Visit: Payer: Self-pay | Admitting: Medical

## 2016-12-06 MED ORDER — ESCITALOPRAM OXALATE 10 MG PO TABS: 10.0000 mg | ORAL_TABLET | Freq: Every day | ORAL | 1 refills | Status: DC

## 2016-12-06 NOTE — Telephone Encounter (Signed)
Patient needs Lexapro refill to [facility], not [facility].  Pt ph [phone]

## 2016-12-27 ENCOUNTER — Other Ambulatory Visit: Payer: Self-pay | Admitting: Family Medicine

## 2016-12-27 NOTE — Telephone Encounter (Signed)
Called and l/m for pt to call us back to set up an appt for refill

## 2016-12-28 ENCOUNTER — Other Ambulatory Visit: Payer: Self-pay | Admitting: Family Medicine

## 2016-12-29 ENCOUNTER — Other Ambulatory Visit: Payer: Self-pay | Admitting: Family Medicine

## 2016-12-29 MED ORDER — IRBESARTAN 150 MG PO TABS: 150.0000 mg | ORAL_TABLET | Freq: Every day | ORAL | 0 refills | Status: DC

## 2017-01-14 ENCOUNTER — Encounter: Payer: Self-pay | Admitting: Medical

## 2017-01-14 ENCOUNTER — Ambulatory Visit (INDEPENDENT_AMBULATORY_CARE_PROVIDER_SITE_OTHER): Payer: BLUE CROSS/BLUE SHIELD | Admitting: Medical

## 2017-01-14 VITALS — BP 146/80 | HR 51 | Ht 70.0 in | Wt 216.8 lb

## 2017-01-14 DIAGNOSIS — Z23 Encounter for immunization: Secondary | ICD-10-CM | POA: Insufficient documentation

## 2017-01-14 DIAGNOSIS — Z125 Encounter for screening for malignant neoplasm of prostate: Secondary | ICD-10-CM

## 2017-01-14 DIAGNOSIS — N529 Male erectile dysfunction, unspecified: Secondary | ICD-10-CM | POA: Diagnosis not present

## 2017-01-14 DIAGNOSIS — I10 Essential (primary) hypertension: Secondary | ICD-10-CM

## 2017-01-14 DIAGNOSIS — K219 Gastro-esophageal reflux disease without esophagitis: Secondary | ICD-10-CM | POA: Diagnosis not present

## 2017-01-14 DIAGNOSIS — E039 Hypothyroidism, unspecified: Secondary | ICD-10-CM

## 2017-01-14 DIAGNOSIS — Z0000 Encounter for general adult medical examination without abnormal findings: Secondary | ICD-10-CM | POA: Diagnosis not present

## 2017-01-14 DIAGNOSIS — I2510 Atherosclerotic heart disease of native coronary artery without angina pectoris: Secondary | ICD-10-CM

## 2017-01-14 DIAGNOSIS — Z1211 Encounter for screening for malignant neoplasm of colon: Secondary | ICD-10-CM

## 2017-01-14 DIAGNOSIS — Z7189 Other specified counseling: Secondary | ICD-10-CM

## 2017-01-14 DIAGNOSIS — I252 Old myocardial infarction: Secondary | ICD-10-CM | POA: Diagnosis not present

## 2017-01-14 DIAGNOSIS — N400 Enlarged prostate without lower urinary tract symptoms: Secondary | ICD-10-CM | POA: Diagnosis not present

## 2017-01-14 DIAGNOSIS — M609 Myositis, unspecified: Secondary | ICD-10-CM | POA: Diagnosis not present

## 2017-01-14 DIAGNOSIS — T466X5A Adverse effect of antihyperlipidemic and antiarteriosclerotic drugs, initial encounter: Secondary | ICD-10-CM

## 2017-01-14 DIAGNOSIS — E785 Hyperlipidemia, unspecified: Secondary | ICD-10-CM

## 2017-01-14 DIAGNOSIS — Z1159 Encounter for screening for other viral diseases: Secondary | ICD-10-CM

## 2017-01-14 DIAGNOSIS — Z951 Presence of aortocoronary bypass graft: Secondary | ICD-10-CM

## 2017-01-14 DIAGNOSIS — R7301 Impaired fasting glucose: Secondary | ICD-10-CM | POA: Diagnosis not present

## 2017-01-14 DIAGNOSIS — Z7185 Encounter for immunization safety counseling: Secondary | ICD-10-CM

## 2017-01-14 LAB — POCT URINALYSIS DIP (PROADVANTAGE DEVICE)
BILIRUBIN UA: NEGATIVE mg/dL
Bilirubin, UA: NEGATIVE
Glucose, UA: NEGATIVE mg/dL
LEUKOCYTES UA: NEGATIVE
NITRITE UA: NEGATIVE
PH UA: 6.5 (ref 5.0–8.0)
PROTEIN UA: NEGATIVE mg/dL
RBC UA: NEGATIVE
Specific Gravity, Urine: 1.015
Urobilinogen, Ur: NEGATIVE

## 2017-01-14 NOTE — Addendum Note (Signed)
Addended by: Tyrone Apple on: 01/14/2017 11:20 AM   Modules accepted: Orders

## 2017-01-14 NOTE — Progress Notes (Signed)
Subjective:   HPI  Todd Kelley is a 51 y.o. male who presents for physical Chief Complaint  Patient presents with  . Annual Exam    physical , no other concerns     Medical care team includes: Tysinger, Camelia Eng, PA-C  And Dr. Jill Alexanders here for primary care Dentist Eye doctor Dr. Peter Martinique, cardiology   Concerns: None, doing fine  Reviewed their medical, surgical, family, social, medication, and allergy history and updated chart as appropriate.  Past Medical History:  Diagnosis Date  . Acid reflux   . Allergic asthma    rare flare up, worse with alllergies as of 12/2016  . Coronary artery disease   . Dyslipidemia   . Impaired fasting blood sugar   . MI (myocardial infarction) ([place]) july of 2000   stent  . Palpitations    hx of  . Thyroid disease    hypothyroidism  . Tobacco use    vape, no cigarrette since 04/2015    Past Surgical History:  Procedure Laterality Date  . APPENDECTOMY  2001  . CARDIAC CATHETERIZATION  2000   stent  . CORONARY ANGIOPLASTY  jan. 2001  . CORONARY ANGIOPLASTY WITH STENT PLACEMENT  june 2001   stent  . CORONARY ARTERY BYPASS GRAFT  dec. 2002   LIMA graft to LAD,RIMA graft to the intermediate branch and saphenous vein graft  to the posterior lateral and posterior descending branches of the right coronary    Social History   Socioeconomic History  . Marital status: Legally Separated    Spouse name: Not on file  . Number of children: 3  . Years of education: Not on file  . Highest education level: Not on file  Social Needs  . Financial resource strain: Not on file  . Food insecurity - worry: Not on file  . Food insecurity - inability: Not on file  . Transportation needs - medical: Not on file  . Transportation needs - non-medical: Not on file  Occupational History  . Occupation: [employer]  Tobacco Use  . Smoking status: Former Smoker    Packs/day: 0.50    Years: 10.00    Pack years: 5.00    Types:  Cigarettes    Last attempt to quit: 04/08/2015    Years since quitting: 1.7  . Smokeless tobacco: Never Used  Substance and Sexual Activity  . Alcohol use: Yes    Alcohol/week: 3.0 oz    Types: 5 Cans of beer per week    Comment: occ.  . Drug use: No  . Sexual activity: Not on file  Other Topics Concern  . Not on file  Social History Narrative   Lives with wife, 3 dogs, 3 children, and 2 twins .  Exercise - walking , hunting.   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   As of 12/2016    Family History  Problem Relation Age of Onset  . Liver cancer Mother   . Cancer Mother   . Heart disease Father        cabg   . Heart disease Sister        arrhythmia  . Diabetes Paternal Grandmother   . Cancer Paternal Grandfather        leukemia  . Liver disease Brother   . Asthma Brother   . Suicidality Brother   . Multiple sclerosis Sister   . Prostate cancer Neg Hx   . Kidney cancer Neg Hx  Current Outpatient Medications:  .  aspirin 325 MG tablet, Take 325 mg by mouth daily., Disp: , Rfl:  .  atorvastatin (LIPITOR) 10 MG tablet, TAKE ONE (1) TABLET EACH DAY, Disp: 90 tablet, Rfl: 1 .  Cetirizine HCl (ZYRTEC ALLERGY PO), Take by mouth., Disp: , Rfl:  .  escitalopram (LEXAPRO) 10 MG tablet, Take 1 tablet (10 mg total) by mouth daily., Disp: 30 tablet, Rfl: 1 .  fenofibrate (TRICOR) 145 MG tablet, TAKE ONE (1) TABLET EACH DAY, Disp: 90 tablet, Rfl: 1 .  irbesartan (AVAPRO) 150 MG tablet, Take 1 tablet (150 mg total) by mouth daily., Disp: 90 tablet, Rfl: 0 .  levothyroxine (SYNTHROID, LEVOTHROID) 175 MCG tablet, TAKE 1 TABLET BY MOUTH ONCE DAILY BEFOREBREAKFAST, Disp: 90 tablet, Rfl: 1 .  PROAIR RESPICLICK 381 (90 Base) MCG/ACT AEPB, INHALE TWO PUFFS INTO THE LUNGS FOUR TIMES DAILY AS NEEDED, Disp: 1 each, Rfl: 0 .  tadalafil (CIALIS) 5 MG tablet, Take 5 mg daily for BPH, Disp: 30 tablet, Rfl: 3 .  silver sulfADIAZINE (SILVADENE) 1 % cream, Apply 1 application topically  daily. (Patient not taking: Reported on 01/14/2017), Disp: 50 g, Rfl: 0  Allergies  Allergen Reactions  . Statins     Myalgias other than low dose  . Tetracycline     REACTION: Nervous,shakey    Review of Systems Constitutional: -fever, -chills, -sweats, -unexpected weight change, -decreased appetite, -fatigue Allergy: -sneezing, -itching, -congestion Dermatology: -changing moles, --rash, -lumps ENT: -runny nose, -ear pain, -sore throat, -hoarseness, -sinus pain, -teeth pain, - ringing in ears, -hearing loss, -nosebleeds Cardiology: -chest pain, -palpitations, -swelling, -difficulty breathing when lying flat, -waking up short of breath Respiratory: -cough, -shortness of breath, -difficulty breathing with exercise or exertion, -wheezing, -coughing up blood Gastroenterology: -abdominal pain, -nausea, -vomiting, -diarrhea, -constipation, -blood in stool, -changes in bowel movement, -difficulty swallowing or eating Hematology: -bleeding, -bruising  Musculoskeletal: -joint aches, -muscle aches, -joint swelling, -back pain, -neck pain, -cramping, -changes in gait Ophthalmology: denies vision changes, eye redness, itching, discharge Urology: -burning with urination, -difficulty urinating, -blood in urine, -urinary frequency, -urgency, -incontinence Neurology: -headache, -weakness, -tingling, -numbness, -memory loss, -falls, -dizziness Psychology: -depressed mood, -agitation, -sleep problems     Objective:   BP (!) 146/80   Pulse (!) 51   Ht 5\' 10"  (1.778 m)   Wt 216 lb 12.8 oz (98.3 kg)   SpO2 96%   BMI 31.11 kg/m   BP Readings from Last 3 Encounters:  01/14/17 (!) 146/80  10/01/16 130/88  12/19/15 (!) 144/84   Wt Readings from Last 3 Encounters:  01/14/17 216 lb 12.8 oz (98.3 kg)  10/01/16 214 lb (97.1 kg)  12/19/15 203 lb (92.1 kg)    General appearance: alert, no distress, WD/WN, Caucasian male Skin: scattered macules, no worrisome lesions HEENT: normocephalic,  conjunctiva/corneas normal, sclerae anicteric, PERRLA, EOMi, nares patent, no discharge or erythema, pharynx normal Oral cavity: MMM, tongue normal, teeth in good repair Neck: supple, no lymphadenopathy, no thyromegaly, no masses, normal ROM, no bruits Chest: vertical chests surgical scar s/p CABG, non tender, normal shape and expansion Heart: RRR, normal S1, S2, no murmurs Lungs: CTA bilaterally, no wheezes, rhonchi, or rales Abdomen: +bs, soft, non tender, non distended, no masses, no hepatomegaly, no splenomegaly, no bruits Back: non tender, normal ROM, no scoliosis Musculoskeletal: upper extremities non tender, no obvious deformity, normal ROM throughout, lower extremities non tender, no obvious deformity, normal ROM throughout Extremities: no edema, no cyanosis, no clubbing Pulses: 2+ symmetric, upper and lower  extremities, normal cap refill Neurological: alert, oriented x 3, CN2-12 intact, strength normal upper extremities and lower extremities, sensation normal throughout, DTRs 2+ throughout, no cerebellar signs, gait normal Psychiatric: normal affect, behavior normal, pleasant  GU: normal male external genitalia,circumcised, nontender, no masses, no hernia, no lymphadenopathy Rectal: anus normal tone, prostate mildly enlarged, no nodules   Assessment and Plan :    Encounter Diagnoses  Name Primary?  . Encounter for health maintenance examination in adult Yes  . Need for influenza vaccination   . Coronary artery disease involving native coronary artery of native heart without angina pectoris   . History of MI (myocardial infarction)   . Dyslipidemia   . Erectile dysfunction, unspecified erectile dysfunction type   . Benign prostatic hyperplasia, unspecified whether lower urinary tract symptoms present   . Statin-induced myositis   . Impaired fasting blood sugar   . Hypothyroidism (acquired)   . Gastroesophageal reflux disease without esophagitis   . Essential hypertension   .  S/P CABG (coronary artery bypass graft)   . Screening for prostate cancer   . Screen for colon cancer   . Vaccine counseling     Physical exam - discussed and counseled on healthy lifestyle, diet, exercise, preventative care, vaccinations, sick and well care, proper use of emergency dept and after hours care, and addressed their concerns.     Health screening: See your eye doctor yearly for routine vision care. See your dentist yearly for routine dental care including hygiene visits twice yearly. See cardiology yearly for follow up  Cancer screening Discussed colonoscopy screening age 7yo unless higher risk for earlier screening Discussed PSA, prostate exam, and prostate cancer screening risks/benefits.   Discussed prostate symptoms as well.  Prostate screening performed: Yes  Vaccinations: Counseled on the influenza virus vaccine.  Vaccine information sheet given.  Influenza vaccine given after consent obtained.  I recommend you have a shingles vaccine to help prevent shingles or herpes zoster outbreak.   Please call your insurer to inquire about coverage for the Shingrix vaccine given in 2 doses.   Some insurers cover this vaccine after age 57, some cover this after age 76.  If your insurer covers this, then call to schedule appointment to have this vaccine here.  I recommend you also have Pneumococcal 23 vaccine.  C/t same medications   Todd Kelley was seen today for annual exam.  Diagnoses and all orders for this visit:  Encounter for health maintenance examination in adult -     Lipid panel -     Comprehensive metabolic panel -     CBC -     Hemoglobin A1c -     TSH -     PSA  Need for influenza vaccination -     Flu Vaccine QUAD 6+ mos PF IM (Fluarix Quad PF)  Coronary artery disease involving native coronary artery of native heart without angina pectoris  History of MI (myocardial infarction)  Dyslipidemia  Erectile dysfunction, unspecified erectile dysfunction  type  Benign prostatic hyperplasia, unspecified whether lower urinary tract symptoms present  Statin-induced myositis  Impaired fasting blood sugar  Hypothyroidism (acquired)  Gastroesophageal reflux disease without esophagitis  Essential hypertension  S/P CABG (coronary artery bypass graft)  Screening for prostate cancer  Screen for colon cancer  Vaccine counseling    Follow-up pending labs, yearly for physical

## 2017-01-14 NOTE — Patient Instructions (Signed)
Encounter Diagnoses  Name Primary?  . Encounter for health maintenance examination in adult Yes  . Need for influenza vaccination   . Coronary artery disease involving native coronary artery of native heart without angina pectoris   . History of MI (myocardial infarction)   . Dyslipidemia   . Erectile dysfunction, unspecified erectile dysfunction type   . Benign prostatic hyperplasia, unspecified whether lower urinary tract symptoms present   . Statin-induced myositis   . Impaired fasting blood sugar   . Hypothyroidism (acquired)   . Gastroesophageal reflux disease without esophagitis   . Essential hypertension   . S/P CABG (coronary artery bypass graft)   . Screening for prostate cancer   . Screen for colon cancer   . Vaccine counseling   . Encounter for hepatitis C screening test for low risk patient    Recommendations:  Let me know when you are ready to be referred for Colonoscopy  We will call with lab results  I recommend you have a shingles vaccine to help prevent shingles or herpes zoster outbreak.   Please call your insurer to inquire about coverage for the Shingrix vaccine given in 2 doses.   Some insurers cover this vaccine after age 79, some cover this after age 44.  If your insurer covers this, then call to schedule appointment to have this vaccine here.  Also check insurance coverage for Pneumococcal 23 vaccine   Diet  Increase your water intake, get at least 64 ounces of water daily  Eat 3-4 fruits daily  Eat plenty of vegetables throughout the day, preferably each meal  Eat good sources of grains such as oatmeal, barley, whole grain pasta, whole grain bread, but limit the serving size to 1 cup of oatmeal or pasta per meal or 2 slices of bread per meal  We don't need to meat at each meal, however if you do eat meat, limit serving size to the size of your palm, and eat chicken fish or [place], lean cuts of meat  Eat beans every day as this is a good nutrient  source and helps to curb appetite  Consider using a program such as Weight Watchers  Consider using a Smart phone app such as My Fitness PAL or [employer] to track your calories and progress   Things to limit or avoid:  Avoid fast food, fried foods, fatty foods  Limit sweets, ice cream, cake and other baked goods  Avoid soda, beer, alcohol, sweet tea  Exercise  You need to be exercising most days of the week for 30-45 minutes or more  Good forms of exercise include walking, hiking, stationary bike or bicycling outside, lap swimming, aerobics class, dance, Zumba

## 2017-01-15 LAB — HEMOGLOBIN A1C
EAG (MMOL/L): 7.3 (calc)
Hgb A1c MFr Bld: 6.2 % of total Hgb — ABNORMAL HIGH (ref <5.7)
Mean Plasma Glucose: 131 (calc)

## 2017-01-15 LAB — HEPATITIS C ANTIBODY
HEP C AB: NONREACTIVE
SIGNAL TO CUT-OFF: 0.02 (ref <1.00)

## 2017-01-15 LAB — COMPREHENSIVE METABOLIC PANEL
AG RATIO: 1.8 (calc) (ref 1.0–2.5)
ALT: 17 U/L (ref 9–46)
AST: 15 U/L (ref 10–35)
Albumin: 4.6 g/dL (ref 3.6–5.1)
Alkaline phosphatase (APISO): 50 U/L (ref 40–115)
BUN: 14 mg/dL (ref 7–25)
CO2: 27 mmol/L (ref 20–32)
Calcium: 9.8 mg/dL (ref 8.6–10.3)
Chloride: 103 mmol/L (ref 98–110)
Creat: 1.15 mg/dL (ref 0.70–1.33)
GLUCOSE: 118 mg/dL — AB (ref 65–99)
Globulin: 2.5 g/dL (calc) (ref 1.9–3.7)
Potassium: 4.6 mmol/L (ref 3.5–5.3)
SODIUM: 139 mmol/L (ref 135–146)
TOTAL PROTEIN: 7.1 g/dL (ref 6.1–8.1)
Total Bilirubin: 0.6 mg/dL (ref 0.2–1.2)

## 2017-01-15 LAB — TSH: TSH: 1.41 mIU/L (ref 0.40–4.50)

## 2017-01-15 LAB — CBC
HEMATOCRIT: 45 % (ref 38.5–50.0)
HEMOGLOBIN: 15.3 g/dL (ref 13.2–17.1)
MCH: 29.2 pg (ref 27.0–33.0)
MCHC: 34 g/dL (ref 32.0–36.0)
MCV: 85.9 fL (ref 80.0–100.0)
MPV: 11.1 fL (ref 7.5–12.5)
Platelets: 222 10*3/uL (ref 140–400)
RBC: 5.24 10*6/uL (ref 4.20–5.80)
RDW: 13 % (ref 11.0–15.0)
WBC: 7.4 10*3/uL (ref 3.8–10.8)

## 2017-01-15 LAB — LIPID PANEL
Cholesterol: 191 mg/dL (ref <200)
HDL: 43 mg/dL (ref >40)
LDL CHOLESTEROL (CALC): 125 mg/dL — AB
Non-HDL Cholesterol (Calc): 148 mg/dL (calc) — ABNORMAL HIGH (ref <130)
Total CHOL/HDL Ratio: 4.4 (calc) (ref <5.0)
Triglycerides: 118 mg/dL (ref <150)

## 2017-01-15 LAB — PSA: PSA: 1.8 ng/mL (ref <=4.0)

## 2017-01-18 ENCOUNTER — Other Ambulatory Visit: Payer: Self-pay | Admitting: Medical

## 2017-01-18 MED ORDER — IRBESARTAN 150 MG PO TABS: 150.0000 mg | ORAL_TABLET | Freq: Every day | ORAL | 3 refills | Status: DC

## 2017-01-18 MED ORDER — ASPIRIN 325 MG PO TABS: 325.0000 mg | ORAL_TABLET | Freq: Every day | ORAL | 3 refills | Status: DC

## 2017-01-18 MED ORDER — FENOFIBRATE 145 MG PO TABS: ORAL_TABLET | ORAL | 0 refills | Status: DC

## 2017-01-18 MED ORDER — ATORVASTATIN CALCIUM 10 MG PO TABS: ORAL_TABLET | ORAL | 0 refills | Status: DC

## 2017-02-09 ENCOUNTER — Telehealth: Payer: Self-pay | Admitting: Medical

## 2017-02-09 ENCOUNTER — Other Ambulatory Visit: Payer: Self-pay | Admitting: Medical

## 2017-02-09 MED ORDER — ESCITALOPRAM OXALATE 10 MG PO TABS: 10.0000 mg | ORAL_TABLET | Freq: Every day | ORAL | 1 refills | Status: DC

## 2017-02-09 NOTE — Telephone Encounter (Signed)
Pt called and is requesting a refill on his lexapro pt uses [facility].pt can be reached at [phone]

## 2017-02-25 ENCOUNTER — Other Ambulatory Visit: Payer: Self-pay | Admitting: Family Medicine

## 2017-06-28 ENCOUNTER — Other Ambulatory Visit: Payer: Self-pay | Admitting: Family Medicine

## 2017-06-29 NOTE — Telephone Encounter (Signed)
[facility] is requesting to fill pt cialis. Please advise kH

## 2017-07-14 ENCOUNTER — Other Ambulatory Visit: Payer: Self-pay | Admitting: Medical

## 2017-07-14 ENCOUNTER — Telehealth: Payer: Self-pay

## 2017-07-14 MED ORDER — LEVOTHYROXINE SODIUM 175 MCG PO TABS: ORAL_TABLET | ORAL | 0 refills | Status: DC

## 2017-07-14 NOTE — Telephone Encounter (Signed)
Med sent.

## 2017-07-14 NOTE — Telephone Encounter (Signed)
Received voicemail for RX refill for Synthroid 175 MCG/ACT from patient and he states that he has been out for a few days.   Please send to [facility].  Please contact patient at [phone].  Thank you

## 2017-07-15 NOTE — Telephone Encounter (Signed)
Patient notified

## 2017-08-12 ENCOUNTER — Other Ambulatory Visit: Payer: Self-pay | Admitting: Family Medicine

## 2017-09-14 ENCOUNTER — Other Ambulatory Visit: Payer: Self-pay

## 2017-09-14 ENCOUNTER — Telehealth: Payer: Self-pay | Admitting: Medical

## 2017-09-14 DIAGNOSIS — F430 Acute stress reaction: Secondary | ICD-10-CM

## 2017-09-14 MED ORDER — ESCITALOPRAM OXALATE 10 MG PO TABS: 10.0000 mg | ORAL_TABLET | Freq: Every day | ORAL | 0 refills | Status: DC

## 2017-09-14 NOTE — Telephone Encounter (Signed)
Pt called and scheduled a cpe for November which is when he is due for one. Pt is requesting refills on Lexapro until that visit be sent to [facility].

## 2017-09-14 NOTE — Telephone Encounter (Signed)
That is fine, given refill

## 2017-09-14 NOTE — Telephone Encounter (Signed)
Is this ok to refill?  

## 2017-10-19 ENCOUNTER — Other Ambulatory Visit: Payer: Self-pay

## 2017-10-19 ENCOUNTER — Telehealth: Payer: Self-pay | Admitting: Family Medicine

## 2017-10-19 DIAGNOSIS — E039 Hypothyroidism, unspecified: Secondary | ICD-10-CM

## 2017-10-19 MED ORDER — LEVOTHYROXINE SODIUM 175 MCG PO TABS: ORAL_TABLET | ORAL | 0 refills | Status: DC

## 2017-10-19 NOTE — Telephone Encounter (Signed)
Levothyroxine sent into [facility].

## 2017-10-19 NOTE — Telephone Encounter (Signed)
Pt called left message that [facility] has been trying to get [place] to refill Lovthyroxine.  Called pt advised no refill request have been received and I would send back request.  Pt has cpe sched for November   Levothryoxine needed to be refilled to [facility].

## 2017-12-03 ENCOUNTER — Other Ambulatory Visit: Payer: Self-pay | Admitting: Family Medicine

## 2017-12-05 NOTE — Telephone Encounter (Signed)
Get in for physical, send refill

## 2017-12-05 NOTE — Telephone Encounter (Signed)
Patient already has appointment.

## 2017-12-05 NOTE — Telephone Encounter (Signed)
[facility] is requesting to fill pt Cialis. Please advise . [place]

## 2017-12-12 ENCOUNTER — Other Ambulatory Visit: Payer: Self-pay

## 2017-12-12 ENCOUNTER — Ambulatory Visit
Admission: EM | Admit: 2017-12-12 | Discharge: 2017-12-12 | Disposition: A | Discharge status: Home / Self Care | Payer: BLUE CROSS/BLUE SHIELD | Attending: Family Medicine | Admitting: Family Medicine

## 2017-12-12 DIAGNOSIS — X58XXXA Exposure to other specified factors, initial encounter: Secondary | ICD-10-CM

## 2017-12-12 DIAGNOSIS — S01112A Laceration without foreign body of left eyelid and periocular area, initial encounter: Secondary | ICD-10-CM | POA: Diagnosis not present

## 2017-12-12 MED ORDER — LIDOCAINE-EPINEPHRINE-TETRACAINE (LET) SOLUTION
3.0000 mL | Freq: Once | NASAL | Status: AC
  Administered 2017-12-12: 3 mL via TOPICAL

## 2017-12-12 NOTE — Discharge Instructions (Signed)
Follow up in 6 days for suture removal Follow up sooner if any problems

## 2017-12-12 NOTE — ED Triage Notes (Signed)
Pt was struck by a metal pole while he was throwing it in the trash. Laceration at base of left eyebrow. Pain 5/10

## 2017-12-12 NOTE — ED Provider Notes (Signed)
[facility]    CSN: [id number] Arrival date & time: 12/12/17  1546     History   Chief Complaint Chief Complaint  Patient presents with  . Facial Injury    HPI Todd Kelley is a 52 y.o. male.   52 yo male with a c/o left eyebrow laceration after being struck by a metal pole he was throwing in the trash about 2 hours ago. Denies any loss of consciousness or vision changes. States he's up to date on his tetanus immunization.   The history is provided by the patient.  Facial Injury    Past Medical History:  Diagnosis Date  . Acid reflux   . Allergic asthma    rare flare up, worse with alllergies as of 12/2016  . Coronary artery disease   . Dyslipidemia   . Impaired fasting blood sugar   . MI (myocardial infarction) ([place]) july of 2000   stent  . Palpitations    hx of  . Thyroid disease    hypothyroidism  . Tobacco use    vape, no cigarrette since 04/2015    Patient Active Problem List   Diagnosis Date Noted  . Screen for colon cancer 01/14/2017  . Vaccine counseling 01/14/2017  . Need for influenza vaccination 01/14/2017  . Encounter for health maintenance examination in adult 12/18/2015  . History of MI (myocardial infarction) 12/18/2015  . Screening for prostate cancer 12/18/2015  . Impaired fasting blood sugar 12/18/2015  . Statin-induced myositis 12/18/2015  . Erectile dysfunction 11/18/2015  . Need for Tdap vaccination 11/18/2015  . Acute stress reaction 11/18/2015  . Hypothyroidism (acquired) 11/18/2015  . Essential hypertension 11/18/2015  . BPH (benign prostatic hyperplasia) 11/18/2015  . S/P CABG (coronary artery bypass graft) 03/31/2011  . Dysphagia 03/31/2011  . Coronary artery disease   . Dyslipidemia   . Acid reflux   . Palpitations     Past Surgical History:  Procedure Laterality Date  . APPENDECTOMY  2001  . CARDIAC CATHETERIZATION  2000   stent  . CORONARY ANGIOPLASTY  jan. 2001  . CORONARY ANGIOPLASTY WITH STENT  PLACEMENT  june 2001   stent  . CORONARY ARTERY BYPASS GRAFT  dec. 2002   LIMA graft to LAD,RIMA graft to the intermediate branch and saphenous vein graft  to the posterior lateral and posterior descending branches of the right coronary       Home Medications    Prior to Admission medications   Medication Sig Start Date End Date Taking? Authorizing Provider  aspirin 325 MG tablet Take 1 tablet (325 mg total) daily by mouth. 01/18/17   Tysinger, Camelia Eng, PA-C  atorvastatin (LIPITOR) 10 MG tablet TAKE ONE (1) TABLET EACH DAY 01/18/17   Tysinger, Camelia Eng, PA-C  atorvastatin (LIPITOR) 10 MG tablet TAKE 1 TABLET BY MOUTH ONCE DAILY 08/15/17   Tysinger, Camelia Eng, PA-C  Cetirizine HCl (ZYRTEC ALLERGY PO) Take by mouth.    [provider]  CIALIS 5 MG tablet TAKE 1 TABLET BY MOUTH ONCE DAILY 12/05/17   Tysinger, Camelia Eng, PA-C  escitalopram (LEXAPRO) 10 MG tablet Take 1 tablet (10 mg total) by mouth daily. 09/14/17   Tysinger, Camelia Eng, PA-C  fenofibrate (TRICOR) 145 MG tablet TAKE ONE (1) TABLET EACH DAY 01/18/17   Tysinger, Camelia Eng, PA-C  irbesartan (AVAPRO) 150 MG tablet Take 1 tablet (150 mg total) daily by mouth. 01/18/17   Tysinger, Camelia Eng, PA-C  levothyroxine (SYNTHROID, LEVOTHROID) 175 MCG tablet TAKE 1 TABLET  BY MOUTH ONCE DAILY BEFOREBREAKFAST 10/19/17   Tysinger, Camelia Eng, PA-C  PROAIR RESPICLICK 702 [phone] Base) MCG/ACT AEPB INHALE TWO PUFFS INTO THE LUNGS FOUR TIMES DAILY AS NEEDED 04/23/16   Denita Lung, MD  silver sulfADIAZINE (SILVADENE) 1 % cream Apply 1 application topically daily. Patient not taking: Reported on 01/14/2017 10/01/16   Tysinger, Camelia Eng, PA-C  losartan (COZAAR) 100 MG tablet Take 1 tablet (100 mg total) by mouth daily. 04/18/15 07/11/15  Martinique, Peter M, MD    Family History Family History  Problem Relation Age of Onset  . Liver cancer Mother   . Cancer Mother   . Heart disease Father        cabg   . Heart disease Sister        arrhythmia  . Diabetes  Paternal Grandmother   . Cancer Paternal Grandfather        leukemia  . Liver disease Brother   . Asthma Brother   . Suicidality Brother   . Multiple sclerosis Sister   . Prostate cancer Neg Hx   . Kidney cancer Neg Hx     Social History Social History   Tobacco Use  . Smoking status: Former Smoker    Packs/day: 0.50    Years: 10.00    Pack years: 5.00    Types: Cigarettes    Last attempt to quit: 04/08/2015    Years since quitting: 2.6  . Smokeless tobacco: Never Used  Substance Use Topics  . Alcohol use: Yes    Alcohol/week: 5.0 standard drinks    Types: 5 Cans of beer per week    Comment: occ.  . Drug use: No     Allergies   Statins and Tetracycline   Review of Systems Review of Systems   Physical Exam Triage Vital Signs ED Triage Vitals  Enc Vitals Group     BP 12/12/17 1557 (!) 145/89     Pulse Rate 12/12/17 1557 96     Resp 12/12/17 1557 18     Temp 12/12/17 1557 98.3 F (36.8 C)     Temp Source 12/12/17 1557 Oral     SpO2 12/12/17 1557 96 %     Weight 12/12/17 1558 213 lb (96.6 kg)     Height 12/12/17 1558 5\' 10"  (1.778 m)     Head Circumference --      Peak Flow --      Pain Score 12/12/17 1557 5     Pain Loc --      Pain Edu? --      Excl. in [place]? --    No data found.  Updated Vital Signs BP (!) 145/89 (BP Location: Left Arm)   Pulse 96   Temp 98.3 F (36.8 C) (Oral)   Resp 18   Ht 5\' 10"  (1.778 m)   Wt 96.6 kg   SpO2 96%   BMI 30.56 kg/m   Visual Acuity Right Eye Distance:   Left Eye Distance:   Bilateral Distance:    Right Eye Near:   Left Eye Near:    Bilateral Near:     Physical Exam  Constitutional: He appears well-developed and well-nourished. No distress.  HENT:  Head: Normocephalic.    2cm laceration to edge of left eyebrow  Eyes: Pupils are equal, round, and reactive to light. Conjunctivae and EOM are normal. Right eye exhibits no discharge. Left eye exhibits no discharge.  Mild swelling to left upper eyelid    Skin: He is not  diaphoretic.  Nursing note and vitals reviewed.    UC Treatments / Results  Labs (all labs ordered are listed, but only abnormal results are displayed) Labs Reviewed - No data to display  EKG None  Radiology No results found.  Procedures Laceration Repair Date/Time: 12/12/2017 5:34 PM Performed by: Norval Gable, MD Authorized by: Norval Gable, MD   Consent:    Consent obtained:  Verbal   Consent given by:  Patient   Risks discussed:  Infection, need for additional repair, nerve damage, pain, poor cosmetic result, poor wound healing, retained foreign body and vascular damage   Alternatives discussed:  No treatment Anesthesia (see MAR for exact dosages):    Anesthesia method:  Topical application and local infiltration   Topical anesthetic:  LET   Local anesthetic:  Lidocaine 1% w/o epi Laceration details:    Location:  Face   Face location:  L eyebrow   Length (cm):  2 Repair type:    Repair type:  Simple Pre-procedure details:    Preparation:  Patient was prepped and draped in usual sterile fashion Exploration:    Hemostasis achieved with:  Direct pressure and LET   Wound exploration: wound explored through full range of motion     Wound extent: areolar tissue violated     Wound extent: no foreign bodies/material noted and no muscle damage noted     Contaminated: no   Treatment:    Area cleansed with:  Betadine   Amount of cleaning:  Standard   Irrigation solution:  Sterile saline and sterile water   Foreign body removal: no foreign body visualized.   Skin repair:    Repair method:  Sutures   Suture size:  5-0   Suture material:  Nylon   Suture technique:  Simple interrupted   Number of sutures:  4 Approximation:    Approximation:  Close Post-procedure details:    Dressing:  Antibiotic ointment   Patient tolerance of procedure:  Tolerated well, no immediate complications   (including critical care time)  Medications Ordered in  UC Medications  lidocaine-EPINEPHrine-tetracaine (LET) solution (3 mLs Topical Given 12/12/17 1613)    Initial Impression / Assessment and Plan / UC Course  I have reviewed the triage vital signs and the nursing notes.  Pertinent labs & imaging results that were available during my care of the patient were reviewed by me and considered in my medical decision making (see chart for details).      Final Clinical Impressions(s) / UC Diagnoses   Final diagnoses:  Laceration of left eyebrow, initial encounter     Discharge Instructions     Follow up in 6 days for suture removal Follow up sooner if any problems    ED Prescriptions    None      1. diagnosis reviewed with patient 2. Procedure as per note above 3. Recommend supportive treatment as above 4. Follow-up prn if symptoms worsen or don't improve  Controlled Substance Prescriptions [place] Controlled Substance Registry consulted? Not Applicable   Norval Gable, MD 12/12/17 [phone]

## 2017-12-19 ENCOUNTER — Ambulatory Visit
Admission: EM | Admit: 2017-12-19 | Discharge: 2017-12-19 | Disposition: A | Discharge status: Home / Self Care | Payer: BLUE CROSS/BLUE SHIELD

## 2017-12-19 NOTE — ED Triage Notes (Signed)
Patient here for suture removal to left eyebrow.

## 2018-01-11 ENCOUNTER — Other Ambulatory Visit: Payer: Self-pay

## 2018-01-11 DIAGNOSIS — E039 Hypothyroidism, unspecified: Secondary | ICD-10-CM

## 2018-01-11 DIAGNOSIS — F430 Acute stress reaction: Secondary | ICD-10-CM

## 2018-01-11 NOTE — Telephone Encounter (Signed)
Are these ok to refill? 

## 2018-01-11 NOTE — Telephone Encounter (Signed)
[facility] faxed a request for the pending medications

## 2018-01-11 NOTE — Telephone Encounter (Signed)
Please call after medicines have been sent pt would like them sent to the [facility]

## 2018-01-12 MED ORDER — CIALIS 5 MG PO TABS: 5.0000 mg | ORAL_TABLET | Freq: Every day | ORAL | 0 refills | Status: DC

## 2018-01-12 MED ORDER — LEVOTHYROXINE SODIUM 175 MCG PO TABS: ORAL_TABLET | ORAL | 0 refills | Status: DC

## 2018-01-12 MED ORDER — IRBESARTAN 150 MG PO TABS: 150.0000 mg | ORAL_TABLET | Freq: Every day | ORAL | 3 refills | Status: DC

## 2018-01-12 MED ORDER — ATORVASTATIN CALCIUM 10 MG PO TABS: ORAL_TABLET | ORAL | 1 refills | Status: DC

## 2018-01-12 MED ORDER — FENOFIBRATE 145 MG PO TABS: ORAL_TABLET | ORAL | 0 refills | Status: DC

## 2018-01-12 MED ORDER — ESCITALOPRAM OXALATE 10 MG PO TABS: 10.0000 mg | ORAL_TABLET | Freq: Every day | ORAL | 0 refills | Status: DC

## 2018-01-12 NOTE — Telephone Encounter (Signed)
It is time for yearly physical, last CPX 12/2016.   Need CPX appt, can send #30 day supply

## 2018-01-12 NOTE — Telephone Encounter (Signed)
Patient notified that RX were sent in

## 2018-01-16 ENCOUNTER — Ambulatory Visit (INDEPENDENT_AMBULATORY_CARE_PROVIDER_SITE_OTHER): Payer: BLUE CROSS/BLUE SHIELD | Admitting: Medical

## 2018-01-16 ENCOUNTER — Encounter: Payer: Self-pay | Admitting: Medical

## 2018-01-16 VITALS — BP 130/82 | HR 59 | Temp 97.8°F | Resp 16 | Ht 70.0 in | Wt 218.0 lb

## 2018-01-16 DIAGNOSIS — N529 Male erectile dysfunction, unspecified: Secondary | ICD-10-CM

## 2018-01-16 DIAGNOSIS — Z125 Encounter for screening for malignant neoplasm of prostate: Secondary | ICD-10-CM

## 2018-01-16 DIAGNOSIS — E785 Hyperlipidemia, unspecified: Secondary | ICD-10-CM

## 2018-01-16 DIAGNOSIS — K219 Gastro-esophageal reflux disease without esophagitis: Secondary | ICD-10-CM | POA: Diagnosis not present

## 2018-01-16 DIAGNOSIS — R7301 Impaired fasting glucose: Secondary | ICD-10-CM

## 2018-01-16 DIAGNOSIS — G4700 Insomnia, unspecified: Secondary | ICD-10-CM

## 2018-01-16 DIAGNOSIS — I2510 Atherosclerotic heart disease of native coronary artery without angina pectoris: Secondary | ICD-10-CM

## 2018-01-16 DIAGNOSIS — I252 Old myocardial infarction: Secondary | ICD-10-CM

## 2018-01-16 DIAGNOSIS — J309 Allergic rhinitis, unspecified: Secondary | ICD-10-CM

## 2018-01-16 DIAGNOSIS — Z7185 Encounter for immunization safety counseling: Secondary | ICD-10-CM

## 2018-01-16 DIAGNOSIS — R0600 Dyspnea, unspecified: Secondary | ICD-10-CM | POA: Insufficient documentation

## 2018-01-16 DIAGNOSIS — E039 Hypothyroidism, unspecified: Secondary | ICD-10-CM | POA: Diagnosis not present

## 2018-01-16 DIAGNOSIS — Z7189 Other specified counseling: Secondary | ICD-10-CM

## 2018-01-16 DIAGNOSIS — N400 Enlarged prostate without lower urinary tract symptoms: Secondary | ICD-10-CM

## 2018-01-16 DIAGNOSIS — Z0000 Encounter for general adult medical examination without abnormal findings: Secondary | ICD-10-CM | POA: Diagnosis not present

## 2018-01-16 DIAGNOSIS — I10 Essential (primary) hypertension: Secondary | ICD-10-CM

## 2018-01-16 DIAGNOSIS — Z1211 Encounter for screening for malignant neoplasm of colon: Secondary | ICD-10-CM

## 2018-01-16 DIAGNOSIS — Z951 Presence of aortocoronary bypass graft: Secondary | ICD-10-CM

## 2018-01-16 LAB — POCT URINALYSIS DIP (PROADVANTAGE DEVICE)
BILIRUBIN UA: NEGATIVE mg/dL
Bilirubin, UA: NEGATIVE
Blood, UA: NEGATIVE
Glucose, UA: NEGATIVE mg/dL
LEUKOCYTES UA: NEGATIVE
NITRITE UA: NEGATIVE
PH UA: 6 (ref 5.0–8.0)
PROTEIN UA: NEGATIVE mg/dL
Specific Gravity, Urine: 1.015
Urobilinogen, Ur: NEGATIVE

## 2018-01-16 NOTE — Patient Instructions (Addendum)
Thanks for trusting [place] with your health care and for coming in for a physical today.  Below are some general recommendations I have for you:  Yearly screenings See your eye doctor yearly for routine vision care. See your dentist yearly for routine dental care including hygiene visits twice yearly. See me here yearly for a routine physical and preventative care visit   Specific Concerns today:  . We will refer you for colonoscopy in 2020 in January and [place] . We will call you with lab results . Please call your insurance to check coverage for Prevnar 13 and Shingrix vaccines . Get exercise regularly including moderate and high intensity exercise for heart health.  Minimum of 150 minutes of exercise per week . Avoid or significantly limit junk food, fried foods, fast food, red meat, and do focus more on non-potato vegetables, fruits, small portions of whole grains like whole-grain pasta, quinoa, barley and oatmeal .   Chronic issues Coronary artery disease, history of CABG and MI-counseled on diet, secondary prevention, exercise and medication compliance  Continue irbesartan 150 mg daily  Continue aspirin but I recommend you use aspirin 81 mg daily  Hypothyroidism-continue current medication, labs today  Continue levothyroxine 175 mcg daily, pending lab results  Hyperlipidemia-continue current medications, labs today  Continue Lipitor pending lab results, continue TriCor pending lab results  Difficulty breathing and seasonal allergies-we reviewed your breathing test today.  In the spring and fall I would use the cetirizine/Zyrtec prior to having symptoms in the spring and fall  Continue albuterol rescue inhaler for shortness of breath or wheezing as needed during the spring and fall  Your height weight ratio your BMI is elevated  I recommend you work on losing some weight.  Calorie restriction, increasing exercise when possible, and decreasing carbs and overall  calories   Please follow up yearly for a physical.   Preventative Care for Adults - Male      [place]:  A routine yearly physical is a good way to check in with your primary care provider about your health and preventive screening. It is also an opportunity to share updates about your health and any concerns you have, and receive a thorough all-over exam.   Most health insurance companies pay for at least some preventative services.  Check with your health plan for specific coverages.  WHAT PREVENTATIVE SERVICES DO MEN NEED?  Adult men should have their weight and blood pressure checked regularly.   Men age 72 and older should have their cholesterol levels checked regularly.  Beginning at age 86 and continuing to age 31, men should be screened for colorectal cancer.  Certain people may need continued testing until age 19.  Updating vaccinations is part of preventative care.  Vaccinations help protect against diseases such as the flu.  Osteoporosis is a disease in which the bones lose minerals and strength as we age. Men ages 32 and over should discuss this with their caregivers  Lab tests are generally done as part of preventative care to screen for anemia and blood disorders, to screen for problems with the kidneys and liver, to screen for bladder problems, to check blood sugar, and to check your cholesterol level.  Preventative services generally include counseling about diet, exercise, avoiding tobacco, drugs, excessive alcohol consumption, and sexually transmitted infections.    GENERAL RECOMMENDATIONS FOR GOOD HEALTH:  Healthy diet:  Eat a variety of foods, including fruit, vegetables, animal or vegetable protein, such as meat, fish, chicken,  and eggs, or beans, lentils, tofu, and grains, such as rice.  Drink plenty of water daily.  Decrease saturated fat in the diet, avoid lots of red meat, processed foods, sweets, fast foods, and fried  foods.  Exercise:  Aerobic exercise helps maintain good heart health. At least 30-40 minutes of moderate-intensity exercise is recommended. For example, a brisk walk that increases your heart rate and breathing. This should be done on most days of the week.   Find a type of exercise or a variety of exercises that you enjoy so that it becomes a part of your daily life.  Examples are running, walking, swimming, water aerobics, and biking.  For motivation and support, explore group exercise such as aerobic class, spin class, Zumba, Yoga,or  martial arts, etc.    Set exercise goals for yourself, such as a certain weight goal, walk or run in a race such as a 5k walk/run.  Speak to your primary care provider about exercise goals.  Disease prevention:  If you smoke or chew tobacco, find out from your caregiver how to quit. It can literally save your life, no matter how long you have been a tobacco user. If you do not use tobacco, never begin.   Maintain a healthy diet and normal weight. Increased weight leads to problems with blood pressure and diabetes.   The Body Mass Index or BMI is a way of measuring how much of your body is fat. Having a BMI above 27 increases the risk of heart disease, diabetes, hypertension, stroke and other problems related to obesity. Your caregiver can help determine your BMI and based on it develop an exercise and dietary program to help you achieve or maintain this important measurement at a healthful level.  High blood pressure causes heart and blood vessel problems.  Persistent high blood pressure should be treated with medicine if weight loss and exercise do not work.   Fat and cholesterol leaves deposits in your arteries that can block them. This causes heart disease and vessel disease elsewhere in your body.  If your cholesterol is found to be high, or if you have heart disease or certain other medical conditions, then you may need to have your cholesterol monitored  frequently and be treated with medication.   Ask if you should have a cardiac stress test if your history suggests this. A stress test is a test done on a treadmill that looks for heart disease. This test can find disease prior to there being a problem.  Osteoporosis is a disease in which the bones lose minerals and strength as we age. This can result in serious bone fractures. Risk of osteoporosis can be identified using a bone density scan. Men ages 60 and over should discuss this with their caregivers. Ask your caregiver whether you should be taking a calcium supplement and Vitamin D, to reduce the rate of osteoporosis.   Avoid drinking alcohol in excess (more than two drinks per day).  Avoid use of street drugs. Do not share needles with anyone. Ask for professional help if you need assistance or instructions on stopping the use of alcohol, cigarettes, and/or drugs.  Brush your teeth twice a day with fluoride toothpaste, and floss once a day. Good oral hygiene prevents tooth decay and gum disease. The problems can be painful, unattractive, and can cause other health problems. Visit your dentist for a routine oral and dental check up and preventive care every 6-12 months.   Look at your skin regularly.  Use a mirror to look at your back. Notify your caregivers of changes in moles, especially if there are changes in shapes, colors, a size larger than a pencil eraser, an irregular border, or development of new moles.  Safety:  Use seatbelts 100% of the time, whether driving or as a passenger.  Use safety devices such as hearing protection if you work in environments with loud noise or significant background noise.  Use safety glasses when doing any work that could send debris in to the eyes.  Use a helmet if you ride a bike or motorcycle.  Use appropriate safety gear for contact sports.  Talk to your caregiver about gun safety.  Use sunscreen with a SPF (or skin protection factor) of 15 or greater.   Lighter skinned people are at a greater risk of skin cancer. Don't forget to also wear sunglasses in order to protect your eyes from too much damaging sunlight. Damaging sunlight can accelerate cataract formation.   Practice safe sex. Use condoms. Condoms are used for birth control and to help reduce the spread of sexually transmitted infections (or STIs).  Some of the STIs are gonorrhea (the clap), chlamydia, syphilis, trichomonas, herpes, HPV (human papilloma virus) and HIV (human immunodeficiency virus) which causes AIDS. The herpes, HIV and HPV are viral illnesses that have no cure. These can result in disability, cancer and death.   Keep carbon monoxide and smoke detectors in your home functioning at all times. Change the batteries every 6 months or use a model that plugs into the wall.   Vaccinations:  Stay up to date with your tetanus shots and other required immunizations. You should have a booster for tetanus every 10 years. Be sure to get your flu shot every year, since 5%-20% of the [place] population comes down with the flu. The flu vaccine changes each year, so being vaccinated once is not enough. Get your shot in the fall, before the flu season peaks.   Other vaccines to consider:  Human Papilloma Virus or HPV causes cancer of the cervix, and other infections that can be transmitted from person to person. There is a vaccine for HPV, and males should get immunized between the ages of 30 and 53. It requires a series of 3 shots.   Pneumococcal vaccine to protect against certain types of pneumonia.  This is normally recommended for adults age 47 or older.  However, adults younger than 54 years old with certain underlying conditions such as diabetes, heart or lung disease should also receive the vaccine.  Shingles vaccine to protect against Varicella Zoster if you are older than age 64, or younger than 54 years old with certain underlying illness.  If you have not had the Shingrix vaccine,  please call your insurer to inquire about coverage for the Shingrix vaccine given in 2 doses.   Some insurers cover this vaccine after age 60, some cover this after age 10.  If your insurer covers this, then call to schedule appointment to have this vaccine here  Hepatitis A vaccine to protect against a form of infection of the liver by a virus acquired from food.  Hepatitis B vaccine to protect against a form of infection of the liver by a virus acquired from blood or body fluids, particularly if you work in health care.  If you plan to travel internationally, check with your local health department for specific vaccination recommendations.   What should I know about cancer screening? Many types of cancers can be  detected early and may often be prevented. Lung Cancer  You should be screened every year for lung cancer if: ? You are a current smoker who has smoked for at least 30 years. ? You are a former smoker who has quit within the past 15 years.  Talk to your health care provider about your screening options, when you should start screening, and how often you should be screened.  Colorectal Cancer  Routine colorectal cancer screening usually begins at 54 years of age and should be repeated every 5-10 years until you are 54 years old. You may need to be screened more often if early forms of precancerous polyps or small growths are found. Your health care provider may recommend screening at an earlier age if you have risk factors for colon cancer.  Your health care provider may recommend using home test kits to check for hidden blood in the stool.  A small camera at the end of a tube can be used to examine your colon (sigmoidoscopy or colonoscopy). This checks for the earliest forms of colorectal cancer.  Prostate and Testicular Cancer  Depending on your age and overall health, your health care provider may do certain tests to screen for prostate and testicular cancer.  Talk to your  health care provider about any symptoms or concerns you have about testicular or prostate cancer.  Skin Cancer  Check your skin from head to toe regularly.  Tell your health care provider about any new moles or changes in moles, especially if: ? There is a change in a mole's size, shape, or color. ? You have a mole that is larger than a pencil eraser.  Always use sunscreen. Apply sunscreen liberally and repeat throughout the day.  Protect yourself by wearing long sleeves, pants, a wide-brimmed hat, and sunglasses when outside.

## 2018-01-16 NOTE — Progress Notes (Signed)
Subjective:   HPI  Todd Kelley is a 52 y.o. male who presents for physical Chief Complaint  Patient presents with  . cpe    fasting CPE eye exam this year.      Medical care team includes: Dewayne Severe, Leward Quan  here for primary care Dentist Eye doctor Dr. Peter Martinique, cardiology   Concerns: This time of year gets a little dyspneic with allergies and colder weather.  Gets spring and fall allergies and asthma flare.    Otherwise been in usual state of health.  Exercise more now that its not summer.  The heat really bothers him.  Denies SOB or chest pain with exercise.     Wants to do colonoscopy after first of the year, wants it done in [facility] for transportation convenience.    Had flu shot at work recently  BPH-gets up at night once to urinate about 3 AM, no other concerns  Reviewed their medical, surgical, family, social, medication, and allergy history and updated chart as appropriate.  Past Medical History:  Diagnosis Date  . Acid reflux   . Allergic asthma    rare flare up, worse with alllergies as of 12/2016  . Coronary artery disease   . Dyslipidemia   . Hyperlipidemia   . Hypertension   . Impaired fasting blood sugar   . MI (myocardial infarction) ([facility]) july of 2000   stent  . Palpitations    hx of  . Thyroid disease    hypothyroidism  . Tobacco use    vape, no cigarrette since 04/2015    Past Surgical History:  Procedure Laterality Date  . APPENDECTOMY  2001  . CARDIAC CATHETERIZATION  2000   stent  . CORONARY ANGIOPLASTY  jan. 2001  . CORONARY ANGIOPLASTY WITH STENT PLACEMENT  june 2001   stent  . CORONARY ARTERY BYPASS GRAFT  dec. 2002   LIMA graft to LAD,RIMA graft to the intermediate branch and saphenous vein graft  to the posterior lateral and posterior descending branches of the right coronary    Social History   Socioeconomic History  . Marital status: Legally Separated    Spouse name: Not on file  . Number of children:  3  . Years of education: Not on file  . Highest education level: Not on file  Occupational History  . Occupation: Engineer, maintenance  Social Needs  . Financial resource strain: Not on file  . Food insecurity:    Worry: Not on file    Inability: Not on file  . Transportation needs:    Medical: Not on file    Non-medical: Not on file  Tobacco Use  . Smoking status: Former Smoker    Packs/day: 0.50    Years: 10.00    Pack years: 5.00    Types: Cigarettes    Last attempt to quit: 04/08/2015    Years since quitting: 2.7  . Smokeless tobacco: Never Used  Substance and Sexual Activity  . Alcohol use: Yes    Alcohol/week: 5.0 standard drinks    Types: 5 Cans of beer per week    Comment: occ.  . Drug use: No  . Sexual activity: Not on file  Lifestyle  . Physical activity:    Days per week: Not on file    Minutes per session: Not on file  . Stress: Not on file  Relationships  . Social connections:    Talks on phone: Not on file    Gets together: Not on  file    Attends religious service: Not on file    Active member of club or organization: Not on file    Attends meetings of clubs or organizations: Not on file    Relationship status: Not on file  . Intimate partner violence:    Fear of current or ex partner: Not on file    Emotionally abused: Not on file    Physically abused: Not on file    Forced sexual activity: Not on file  Other Topics Concern  . Not on file  Social History Narrative   Lives with wife, 3 dogs, 3 children, and 2 twins .  Exercise - walking , hunting.   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   As of November/2019    Family History  Problem Relation Age of Onset  . Liver cancer Mother   . Cancer Mother   . Heart disease Father        cabg   . Heart disease Sister        arrhythmia  . Diabetes Paternal Grandmother   . Cancer Paternal Grandfather        leukemia  . Liver disease Brother   . Asthma Brother   . Suicidality Brother   .  Multiple sclerosis Sister   . Prostate cancer Neg Hx   . Kidney cancer Neg Hx      Current Outpatient Medications:  .  aspirin 325 MG tablet, Take 1 tablet (325 mg total) daily by mouth., Disp: 90 tablet, Rfl: 3 .  atorvastatin (LIPITOR) 10 MG tablet, TAKE 1 TABLET BY MOUTH ONCE DAILY, Disp: 90 tablet, Rfl: 1 .  CIALIS 5 MG tablet, Take 1 tablet (5 mg total) by mouth daily., Disp: 30 tablet, Rfl: 0 .  escitalopram (LEXAPRO) 10 MG tablet, Take 1 tablet (10 mg total) by mouth daily., Disp: 90 tablet, Rfl: 0 .  fenofibrate (TRICOR) 145 MG tablet, TAKE ONE (1) TABLET EACH DAY, Disp: 90 tablet, Rfl: 0 .  irbesartan (AVAPRO) 150 MG tablet, Take 1 tablet (150 mg total) by mouth daily., Disp: 90 tablet, Rfl: 3 .  levothyroxine (SYNTHROID, LEVOTHROID) 175 MCG tablet, TAKE 1 TABLET BY MOUTH ONCE DAILY BEFOREBREAKFAST, Disp: 90 tablet, Rfl: 0 .  PROAIR RESPICLICK 546 (90 Base) MCG/ACT AEPB, INHALE TWO PUFFS INTO THE LUNGS FOUR TIMES DAILY AS NEEDED, Disp: 1 each, Rfl: 0 .  Cetirizine HCl (ZYRTEC ALLERGY PO), Take by mouth., Disp: , Rfl:  .  silver sulfADIAZINE (SILVADENE) 1 % cream, Apply 1 application topically daily. (Patient not taking: Reported on 01/14/2017), Disp: 50 g, Rfl: 0  Allergies  Allergen Reactions  . Statins     Myalgias other than low dose  . Tetracycline     REACTION: Nervous,shakey    Review of Systems Constitutional: -fever, -chills, -sweats, -unexpected weight change, -decreased appetite, -fatigue Allergy: -sneezing, -itching, -congestion Dermatology: -changing moles, --rash, -lumps ENT: -runny nose, -ear pain, -sore throat, -hoarseness, -sinus pain, -teeth pain, - ringing in ears, -hearing loss, -nosebleeds Cardiology: -chest pain, -palpitations, -swelling, -difficulty breathing when lying flat, -waking up short of breath Respiratory: -cough, +shortness of breath, -difficulty breathing with exercise or exertion, -wheezing, -coughing up blood Gastroenterology: -abdominal  pain, -nausea, -vomiting, -diarrhea, -constipation, -blood in stool, -changes in bowel movement, -difficulty swallowing or eating Hematology: -bleeding, -bruising  Musculoskeletal: -joint aches, -muscle aches, -joint swelling, -back pain, -neck pain, -cramping, -changes in gait Ophthalmology: denies vision changes, eye redness, itching, discharge Urology: -burning with urination, -difficulty urinating, -blood in  urine, -urinary frequency, -urgency, -incontinence Neurology: -headache, -weakness, -tingling, -numbness, -memory loss, -falls, -dizziness Psychology: -depressed mood, -agitation, -sleep problems     Objective:   BP 130/82   Pulse (!) 59   Temp 97.8 F (36.6 C) (Oral)   Resp 16   Ht 5\' 10"  (1.778 m)   Wt 218 lb (98.9 kg)   SpO2 96%   BMI 31.28 kg/m   BP Readings from Last 3 Encounters:  01/16/18 130/82  12/19/17 (!) 142/83  12/12/17 (!) 145/89   Wt Readings from Last 3 Encounters:  01/16/18 218 lb (98.9 kg)  12/12/17 213 lb (96.6 kg)  01/14/17 216 lb 12.8 oz (98.3 kg)      General appearance: alert, no distress, WD/WN, white male Skin: No worrisome lesions, scattered macules HEENT: normocephalic, sclerae anicteric, PERRLA, EOMi, nares patent, no discharge or erythema, pharynx normal Oral cavity: MMM, no lesions Neck: supple, no lymphadenopathy, no thyromegaly, no masses, no bruits Heart: RRR, normal S1, S2, no murmurs Chest with central surgical scar from CABG Lungs: CTA bilaterally, no wheezes, rhonchi, or rales Abdomen: +bs, soft, right mid abdomen's small round scar, non tender, non distended, no masses, no hepatomegaly, no splenomegaly Back: non tender Musculoskeletal: nontender, no swelling, no obvious deformity Extremities: no edema, no cyanosis, no clubbing Pulses: 2+ symmetric, upper and lower extremities, normal cap refill Neurological: alert, oriented x 3, CN2-12 intact, strength normal upper extremities and lower extremities, sensation normal  throughout, DTRs 2+ throughout, no cerebellar signs, gait normal Psychiatric: normal affect, behavior normal, pleasant  GU: Normal male, circumcised, no mass no hernia no lymphadenopathy DRE: Anus normal tone, prostate moderately enlarged, no nodules    Adult ECG Report  Indication: Hypertension, history of heart attack  Rate: 57 bpm  Rhythm: sinus bradycardia  QRS Axis: -40 degrees  PR Interval: 182 ms  QRS Duration: 90 ms  QTc: 412 ms  Conduction Disturbances: Left axis deviation, possible atrial enlargement  Other Abnormalities: none  Patient's cardiac risk factors are: dyslipidemia, hypertension, male gender and History of MI and CABG.  EKG comparison: 2017, no acute changes  Narrative Interpretation: No acute changes     Assessment and Plan :    Encounter Diagnoses  Name Primary?  . Routine general medical examination at a health care facility Yes  . Coronary artery disease involving native coronary artery of native heart without angina pectoris   . Essential hypertension   . Gastroesophageal reflux disease without esophagitis   . Hypothyroidism (acquired)   . Impaired fasting blood sugar   . Benign prostatic hyperplasia, unspecified whether lower urinary tract symptoms present   . Dyslipidemia   . Erectile dysfunction, unspecified erectile dysfunction type   . History of MI (myocardial infarction)   . S/P CABG (coronary artery bypass graft)   . Screen for colon cancer   . Screening for prostate cancer   . Vaccine counseling   . Dyspnea, unspecified type   . Allergic rhinitis, unspecified seasonality, unspecified trigger   . Insomnia, unspecified type     Physical exam - discussed and counseled on healthy lifestyle, diet, exercise, preventative care, vaccinations, sick and well care, proper use of emergency dept and after hours care, and addressed their concerns.    Health screening: See your eye doctor yearly for routine vision care. See your dentist yearly for  routine dental care including hygiene visits twice yearly. See cardiology yearly for follow up  Cancer screening Referral for colonoscopy set for January 2020 Discussed PSA, prostate exam,  and prostate cancer screening risks/benefits.   Discussed prostate symptoms as well.  Prostate screening performed: Yes  Vaccinations: Counseled on the influenza virus vaccine.  He is up-to-date on flu shot per work He is up-to-date on TD vaccine He will call insurance about coverage for Prevnar 13 and Shingrix  Continue routine medications, counseled on secondary prevention of heart disease  Chronic issues Dyspnea, seasonal allergies- PFT reviewed today.  Continue albuterol as needed, continue antihistamine during spring and fall, discussed reasons for follow-up  Coronary artery disease, history of CABG and MI-counseled on diet, secondary prevention, exercise and medication compliance  Continue irbesartan 150 mg daily  Continue aspirin but I recommend you use aspirin 81 mg daily  Hypothyroidism-continue current medication, labs today  Continue levothyroxine 175 mcg daily, pending lab results  Hyperlipidemia-continue current medications, labs today  Continue Lipitor pending lab results, continue TriCor pending lab results  Difficulty breathing and seasonal allergies-we reviewed your breathing test today.  In the spring and fall I would use the cetirizine/Zyrtec prior to having symptoms in the spring and fall  Continue albuterol rescue inhaler for shortness of breath or wheezing as needed during the spring and fall  Your height weight ratio your BMI is elevated  I recommend you work on losing some weight.  Calorie restriction, increasing exercise when possible, and decreasing carbs and overall calories  Insomnia, premature ejaculation -he continues to derive benefit on medication Lexapro.  Discussed risk and benefits of medication  Pending labs will need [facility]  Juston was seen today for cpe.  Diagnoses and all orders for this visit:  Routine general medical examination at a health care facility -     POCT Urinalysis DIP (Proadvantage Device) -     EKG 12-Lead -     Comprehensive metabolic panel -     CBC -     PSA -     TSH -     Lipid panel -     Microalbumin / creatinine urine ratio -     Hemoglobin A1c -     T4, free -     Ambulatory referral to Gastroenterology  Coronary artery disease involving native coronary artery of native heart without angina pectoris  Essential hypertension -     EKG 12-Lead -     Comprehensive metabolic panel -     Lipid panel -     Microalbumin / creatinine urine ratio  Gastroesophageal reflux disease without esophagitis  Hypothyroidism (acquired) -     TSH -     T4, free  Impaired fasting blood sugar -     Hemoglobin A1c  Benign prostatic hyperplasia, unspecified whether lower urinary tract symptoms present  Dyslipidemia  Erectile dysfunction, unspecified erectile dysfunction type  History of MI (myocardial infarction)  S/P CABG (coronary artery bypass graft)  Screen for colon cancer -     Ambulatory referral to Gastroenterology  Screening for prostate cancer  Vaccine counseling  Dyspnea, unspecified type  Allergic rhinitis, unspecified seasonality, unspecified trigger -     Spirometry with Graph  Insomnia, unspecified type    Follow-up pending labs, yearly for physical

## 2018-01-17 ENCOUNTER — Other Ambulatory Visit: Payer: Self-pay | Admitting: Medical

## 2018-01-17 DIAGNOSIS — F430 Acute stress reaction: Secondary | ICD-10-CM

## 2018-01-17 DIAGNOSIS — J45909 Unspecified asthma, uncomplicated: Secondary | ICD-10-CM

## 2018-01-17 LAB — COMPREHENSIVE METABOLIC PANEL
A/G RATIO: 1.8 (ref 1.2–2.2)
ALT: 23 IU/L (ref 0–44)
AST: 13 IU/L (ref 0–40)
Albumin: 4.5 g/dL (ref 3.5–5.5)
Alkaline Phosphatase: 89 IU/L (ref 39–117)
BILIRUBIN TOTAL: 0.5 mg/dL (ref 0.0–1.2)
BUN/Creatinine Ratio: 12 (ref 9–20)
BUN: 13 mg/dL (ref 6–24)
CALCIUM: 9.5 mg/dL (ref 8.7–10.2)
CHLORIDE: 104 mmol/L (ref 96–106)
CO2: 20 mmol/L (ref 20–29)
Creatinine, Ser: 1.07 mg/dL (ref 0.76–1.27)
GFR calc Af Amer: 90 mL/min/{1.73_m2} (ref >59)
GFR, EST NON AFRICAN AMERICAN: 78 mL/min/{1.73_m2} (ref >59)
Globulin, Total: 2.5 g/dL (ref 1.5–4.5)
Glucose: 141 mg/dL — ABNORMAL HIGH (ref 65–99)
POTASSIUM: 4.9 mmol/L (ref 3.5–5.2)
SODIUM: 142 mmol/L (ref 134–144)
Total Protein: 7 g/dL (ref 6.0–8.5)

## 2018-01-17 LAB — CBC
HEMOGLOBIN: 14.6 g/dL (ref 13.0–17.7)
Hematocrit: 43.9 % (ref 37.5–51.0)
MCH: 29.3 pg (ref 26.6–33.0)
MCHC: 33.3 g/dL (ref 31.5–35.7)
MCV: 88 fL (ref 79–97)
Platelets: 215 10*3/uL (ref 150–450)
RBC: 4.98 x10E6/uL (ref 4.14–5.80)
RDW: 12.8 % (ref 12.3–15.4)
WBC: 5.4 10*3/uL (ref 3.4–10.8)

## 2018-01-17 LAB — T4, FREE: Free T4: 1.86 ng/dL — ABNORMAL HIGH (ref 0.82–1.77)

## 2018-01-17 LAB — MICROALBUMIN / CREATININE URINE RATIO
CREATININE, UR: 129.3 mg/dL
MICROALB/CREAT RATIO: 2.5 mg/g{creat} (ref 0.0–30.0)
Microalbumin, Urine: 3.2 ug/mL

## 2018-01-17 LAB — LIPID PANEL
CHOL/HDL RATIO: 4.9 ratio (ref 0.0–5.0)
Cholesterol, Total: 175 mg/dL (ref 100–199)
HDL: 36 mg/dL — AB (ref >39)
LDL Calculated: 117 mg/dL — ABNORMAL HIGH (ref 0–99)
Triglycerides: 109 mg/dL (ref 0–149)
VLDL Cholesterol Cal: 22 mg/dL (ref 5–40)

## 2018-01-17 LAB — PSA: PROSTATE SPECIFIC AG, SERUM: 2.5 ng/mL (ref 0.0–4.0)

## 2018-01-17 LAB — HEMOGLOBIN A1C
Est. average glucose Bld gHb Est-mCnc: 143 mg/dL
Hgb A1c MFr Bld: 6.6 % — ABNORMAL HIGH (ref 4.8–5.6)

## 2018-01-17 LAB — TSH: TSH: 0.266 u[IU]/mL — ABNORMAL LOW (ref 0.450–4.500)

## 2018-01-17 MED ORDER — CIALIS 5 MG PO TABS: 5.0000 mg | ORAL_TABLET | Freq: Every day | ORAL | 5 refills | Status: DC

## 2018-01-17 MED ORDER — DAPAGLIFLOZIN PROPANEDIOL 5 MG PO TABS: 5.0000 mg | ORAL_TABLET | Freq: Every day | ORAL | 1 refills | Status: DC

## 2018-01-17 MED ORDER — IRBESARTAN 150 MG PO TABS: 150.0000 mg | ORAL_TABLET | Freq: Every day | ORAL | 3 refills | Status: DC

## 2018-01-17 MED ORDER — ESCITALOPRAM OXALATE 10 MG PO TABS: 10.0000 mg | ORAL_TABLET | Freq: Every day | ORAL | 3 refills | Status: DC

## 2018-01-17 MED ORDER — FENOFIBRATE 145 MG PO TABS: ORAL_TABLET | ORAL | 3 refills | Status: DC

## 2018-01-17 MED ORDER — ASPIRIN EC 81 MG PO TBEC: 81.0000 mg | DELAYED_RELEASE_TABLET | Freq: Every day | ORAL | 3 refills | Status: DC

## 2018-01-17 MED ORDER — LEVOTHYROXINE SODIUM 150 MCG PO TABS: 150.0000 ug | ORAL_TABLET | Freq: Every day | ORAL | 0 refills | Status: DC

## 2018-01-17 MED ORDER — ATORVASTATIN CALCIUM 20 MG PO TABS: 20.0000 mg | ORAL_TABLET | Freq: Every day | ORAL | 3 refills | Status: DC

## 2018-01-17 MED ORDER — ALBUTEROL SULFATE 108 (90 BASE) MCG/ACT IN AEPB: 2.0000 | INHALATION_SPRAY | Freq: Four times a day (QID) | RESPIRATORY_TRACT | 1 refills | Status: DC | PRN

## 2018-01-24 ENCOUNTER — Other Ambulatory Visit: Payer: Self-pay

## 2018-01-24 DIAGNOSIS — Z1211 Encounter for screening for malignant neoplasm of colon: Secondary | ICD-10-CM

## 2018-02-05 ENCOUNTER — Other Ambulatory Visit: Payer: Self-pay | Admitting: Medical

## 2018-02-06 NOTE — Telephone Encounter (Signed)
Is this ok to refill?  CPE in 11-19

## 2018-02-20 ENCOUNTER — Other Ambulatory Visit: Payer: Self-pay

## 2018-03-13 ENCOUNTER — Other Ambulatory Visit: Payer: Self-pay

## 2018-03-13 ENCOUNTER — Encounter: Payer: Self-pay | Admitting: *Deleted

## 2018-03-13 NOTE — Anesthesia Preprocedure Evaluation (Addendum)
Anesthesia Evaluation  Patient identified by MRN, date of birth, ID band Patient awake    Reviewed: Allergy & Precautions, NPO status , Patient's Chart, lab work & pertinent test results  Airway Mallampati: II  TM Distance: >3 FB     Dental   Pulmonary former smoker,    breath sounds clear to auscultation       Cardiovascular hypertension, + CAD, + Past MI and + CABG (2002)   Rhythm:Regular Rate:Normal  Hyperlipidemia    Neuro/Psych    GI/Hepatic GERD  ,  Endo/Other  Hypothyroidism   Renal/GU      Musculoskeletal   Abdominal   Peds  Hematology Hx DVT after CABG    Anesthesia Other Findings   Reproductive/Obstetrics                            Anesthesia Physical Anesthesia Plan  ASA: III  Anesthesia Plan: General   Post-op Pain Management:    Induction: Intravenous  PONV Risk Score and Plan:   Airway Management Planned: Nasal Cannula and Natural Airway  Additional Equipment:   Intra-op Plan:   Post-operative Plan:   Informed Consent: I have reviewed the patients History and Physical, chart, labs and discussed the procedure including the risks, benefits and alternatives for the proposed anesthesia with the patient or authorized representative who has indicated his/her understanding and acceptance.       Plan Discussed with: CRNA  Anesthesia Plan Comments:        Anesthesia Quick Evaluation

## 2018-03-16 NOTE — Discharge Instructions (Signed)
General Anesthesia, Adult, Care After  This sheet gives you information about how to care for yourself after your procedure. Your health care provider may also give you more specific instructions. If you have problems or questions, contact your health care provider.  What can I expect after the procedure?  After the procedure, the following side effects are common:  Pain or discomfort at the IV site.  Nausea.  Vomiting.  Sore throat.  Trouble concentrating.  Feeling cold or chills.  Weak or tired.  Sleepiness and fatigue.  Soreness and body aches. These side effects can affect parts of the body that were not involved in surgery.  Follow these instructions at home:    For at least 24 hours after the procedure:  Have a responsible adult stay with you. It is important to have someone help care for you until you are awake and alert.  Rest as needed.  Do not:  Participate in activities in which you could fall or become injured.  Drive.  Use heavy machinery.  Drink alcohol.  Take sleeping pills or medicines that cause drowsiness.  Make important decisions or sign legal documents.  Take care of children on your own.  Eating and drinking  Follow any instructions from your health care provider about eating or drinking restrictions.  When you feel hungry, start by eating small amounts of foods that are soft and easy to digest (bland), such as toast. Gradually return to your regular diet.  Drink enough fluid to keep your urine pale yellow.  If you vomit, rehydrate by drinking water, juice, or clear broth.  General instructions  If you have sleep apnea, surgery and certain medicines can increase your risk for breathing problems. Follow instructions from your health care provider about wearing your sleep device:  Anytime you are sleeping, including during daytime naps.  While taking prescription pain medicines, sleeping medicines, or medicines that make you drowsy.  Return to your normal activities as told by your health care  provider. Ask your health care provider what activities are safe for you.  Take over-the-counter and prescription medicines only as told by your health care provider.  If you smoke, do not smoke without supervision.  Keep all follow-up visits as told by your health care provider. This is important.  Contact a health care provider if:  You have nausea or vomiting that does not get better with medicine.  You cannot eat or drink without vomiting.  You have pain that does not get better with medicine.  You are unable to pass urine.  You develop a skin rash.  You have a fever.  You have redness around your IV site that gets worse.  Get help right away if:  You have difficulty breathing.  You have chest pain.  You have blood in your urine or stool, or you vomit blood.  Summary  After the procedure, it is common to have a sore throat or nausea. It is also common to feel tired.  Have a responsible adult stay with you for the first 24 hours after general anesthesia. It is important to have someone help care for you until you are awake and alert.  When you feel hungry, start by eating small amounts of foods that are soft and easy to digest (bland), such as toast. Gradually return to your regular diet.  Drink enough fluid to keep your urine pale yellow.  Return to your normal activities as told by your health care provider. Ask your health care   provider what activities are safe for you.  This information is not intended to replace advice given to you by your health care provider. Make sure you discuss any questions you have with your health care provider.  Document Released: 05/24/2000 Document Revised: 10/01/2016 Document Reviewed: 10/01/2016  Elsevier Interactive Patient Education  2019 [employer]

## 2018-03-17 ENCOUNTER — Ambulatory Visit: Payer: BLUE CROSS/BLUE SHIELD | Admitting: Anesthesiology

## 2018-03-17 ENCOUNTER — Ambulatory Visit
Admission: RE | Admit: 2018-03-17 | Discharge: 2018-03-17 | Disposition: A | Discharge status: Home / Self Care | Payer: BLUE CROSS/BLUE SHIELD | Source: Ambulatory Visit | Attending: Gastroenterology | Admitting: Gastroenterology

## 2018-03-17 ENCOUNTER — Encounter
Admission: RE | Disposition: A | Discharge status: Home / Self Care | Payer: Self-pay | Source: Ambulatory Visit | Attending: Gastroenterology

## 2018-03-17 DIAGNOSIS — F17290 Nicotine dependence, other tobacco product, uncomplicated: Secondary | ICD-10-CM | POA: Diagnosis not present

## 2018-03-17 DIAGNOSIS — E039 Hypothyroidism, unspecified: Secondary | ICD-10-CM | POA: Diagnosis not present

## 2018-03-17 DIAGNOSIS — I252 Old myocardial infarction: Secondary | ICD-10-CM | POA: Insufficient documentation

## 2018-03-17 DIAGNOSIS — Z7982 Long term (current) use of aspirin: Secondary | ICD-10-CM | POA: Insufficient documentation

## 2018-03-17 DIAGNOSIS — I10 Essential (primary) hypertension: Secondary | ICD-10-CM | POA: Diagnosis not present

## 2018-03-17 DIAGNOSIS — D123 Benign neoplasm of transverse colon: Secondary | ICD-10-CM | POA: Insufficient documentation

## 2018-03-17 DIAGNOSIS — Z1211 Encounter for screening for malignant neoplasm of colon: Secondary | ICD-10-CM | POA: Diagnosis not present

## 2018-03-17 DIAGNOSIS — I2510 Atherosclerotic heart disease of native coronary artery without angina pectoris: Secondary | ICD-10-CM | POA: Insufficient documentation

## 2018-03-17 DIAGNOSIS — Z951 Presence of aortocoronary bypass graft: Secondary | ICD-10-CM | POA: Diagnosis not present

## 2018-03-17 DIAGNOSIS — J45909 Unspecified asthma, uncomplicated: Secondary | ICD-10-CM | POA: Insufficient documentation

## 2018-03-17 DIAGNOSIS — Z86718 Personal history of other venous thrombosis and embolism: Secondary | ICD-10-CM | POA: Insufficient documentation

## 2018-03-17 DIAGNOSIS — K640 First degree hemorrhoids: Secondary | ICD-10-CM | POA: Diagnosis not present

## 2018-03-17 DIAGNOSIS — K219 Gastro-esophageal reflux disease without esophagitis: Secondary | ICD-10-CM | POA: Insufficient documentation

## 2018-03-17 DIAGNOSIS — Z79899 Other long term (current) drug therapy: Secondary | ICD-10-CM | POA: Diagnosis not present

## 2018-03-17 DIAGNOSIS — E785 Hyperlipidemia, unspecified: Secondary | ICD-10-CM | POA: Diagnosis not present

## 2018-03-17 DIAGNOSIS — Z955 Presence of coronary angioplasty implant and graft: Secondary | ICD-10-CM | POA: Insufficient documentation

## 2018-03-17 HISTORY — PX: COLONOSCOPY WITH PROPOFOL: SHX5780

## 2018-03-17 HISTORY — DX: Acute embolism and thrombosis of unspecified deep veins of unspecified lower extremity: I82.409

## 2018-03-17 HISTORY — DX: Hypothyroidism, unspecified: E03.9

## 2018-03-17 HISTORY — PX: POLYPECTOMY: SHX5525

## 2018-03-17 SURGERY — COLONOSCOPY WITH PROPOFOL
Anesthesia: General | Site: Rectum

## 2018-03-17 MED ORDER — PROPOFOL 10 MG/ML IV BOLUS
INTRAVENOUS | Status: DC | PRN
  Administered 2018-03-17: 50 mg via INTRAVENOUS
  Administered 2018-03-17: 150 mg via INTRAVENOUS
  Administered 2018-03-17: 40 mg via INTRAVENOUS
  Administered 2018-03-17: 20 mg via INTRAVENOUS

## 2018-03-17 MED ORDER — STERILE WATER FOR IRRIGATION IR SOLN
Status: DC | PRN
  Administered 2018-03-17: 09:00:00

## 2018-03-17 MED ORDER — LACTATED RINGERS IV SOLN
10.0000 mL/h | INTRAVENOUS | Status: DC
  Administered 2018-03-17: 09:00:00 via INTRAVENOUS

## 2018-03-17 MED ORDER — SODIUM CHLORIDE 0.9 % IV SOLN: INTRAVENOUS | Status: DC

## 2018-03-17 MED ORDER — LIDOCAINE HCL (CARDIAC) PF 100 MG/5ML IV SOSY
PREFILLED_SYRINGE | INTRAVENOUS | Status: DC | PRN
  Administered 2018-03-17: 30 mg via INTRAVENOUS

## 2018-03-17 SURGICAL SUPPLY — 6 items
CANISTER SUCT 1200ML W/VALVE (MISCELLANEOUS) ×3 IMPLANT
FORCEPS BIOP RAD 4 LRG CAP 4 (CUTTING FORCEPS) ×1 IMPLANT
GOWN CVR UNV OPN BCK APRN NK (MISCELLANEOUS) ×4 IMPLANT
GOWN ISOL THUMB LOOP REG UNIV (MISCELLANEOUS) ×2
KIT ENDO PROCEDURE OLY (KITS) ×3 IMPLANT
WATER STERILE IRR 250ML POUR (IV SOLUTION) ×3 IMPLANT

## 2018-03-17 NOTE — Transfer of Care (Signed)
Immediate Anesthesia Transfer of Care Note  Patient: Todd Kelley  Procedure(s) Performed: COLONOSCOPY WITH PROPOFOL (N/A Rectum) POLYPECTOMY (Rectum)  Patient Location: PACU  Anesthesia Type: General  Level of Consciousness: awake, alert  and patient cooperative  Airway and Oxygen Therapy: Patient Spontanous Breathing and Patient connected to supplemental oxygen  Post-op Assessment: Post-op Vital signs reviewed, Patient's Cardiovascular Status Stable, Respiratory Function Stable, Patent Airway and No signs of Nausea or vomiting  Post-op Vital Signs: Reviewed and stable  Complications: No apparent anesthesia complications

## 2018-03-17 NOTE — Anesthesia Postprocedure Evaluation (Signed)
Anesthesia Post Note  Patient: Todd Kelley  Procedure(s) Performed: COLONOSCOPY WITH PROPOFOL (N/A Rectum) POLYPECTOMY (Rectum)  Patient location during evaluation: PACU Anesthesia Type: General Level of consciousness: awake Pain management: pain level controlled Vital Signs Assessment: post-procedure vital signs reviewed and stable Respiratory status: respiratory function stable Cardiovascular status: stable Postop Assessment: no signs of nausea or vomiting Anesthetic complications: no    Veda Canning

## 2018-03-17 NOTE — Anesthesia Procedure Notes (Addendum)
Date/Time: 03/17/2018 8:41 AM Performed by: Cameron Ali, CRNA Pre-anesthesia Checklist: Patient identified, Emergency Drugs available, Suction available, Timeout performed and Patient being monitored Patient Re-evaluated:Patient Re-evaluated prior to induction Oxygen Delivery Method: Nasal cannula Placement Confirmation: positive ETCO2

## 2018-03-17 NOTE — H&P (Signed)
Lucilla Lame, MD [address] [place] [address], [place] [address] Phone: [phone] Fax : [phone]  Primary Care Physician:  Carlena Hurl, PA-C Primary Gastroenterologist:  Dr. Allen Norris  Pre-Procedure History & Physical: HPI:  Todd Kelley is a 53 y.o. male is here for a screening colonoscopy.   Past Medical History:  Diagnosis Date  . Acid reflux   . Allergic asthma    rare flare up, worse with alllergies as of 12/2016  . Coronary artery disease   . DVT (deep venous thrombosis) ([place]) 01/2001   after CABG  . Dyslipidemia   . Hyperlipidemia   . Hypertension   . Hypothyroidism   . Impaired fasting blood sugar   . MI (myocardial infarction) ([place]) july of 2000   stent  . Palpitations    hx of  . Thyroid disease    hypothyroidism  . Tobacco use    vape, no cigarrette since 04/2015    Past Surgical History:  Procedure Laterality Date  . APPENDECTOMY  2001  . CARDIAC CATHETERIZATION  2000   stent  . CORONARY ANGIOPLASTY  jan. 2001  . CORONARY ANGIOPLASTY WITH STENT PLACEMENT  june 2001   stent  . CORONARY ARTERY BYPASS GRAFT  dec. 2002   LIMA graft to LAD,RIMA graft to the intermediate branch and saphenous vein graft  to the posterior lateral and posterior descending branches of the right coronary    Prior to Admission medications   Medication Sig Start Date End Date Taking? Authorizing Provider  aspirin EC 81 MG tablet Take 1 tablet (81 mg total) by mouth daily. 01/17/18  Yes Tysinger, Camelia Eng, PA-C  atorvastatin (LIPITOR) 20 MG tablet Take 1 tablet (20 mg total) by mouth daily. 01/17/18 01/17/19 Yes Tysinger, Camelia Eng, PA-C  cetirizine (ZYRTEC) 10 MG tablet Take 10 mg by mouth daily as needed for allergies.   Yes [provider]  CIALIS 5 MG tablet Take 1 tablet (5 mg total) by mouth daily. 01/17/18  Yes Tysinger, Camelia Eng, PA-C  escitalopram (LEXAPRO) 10 MG tablet Take 1 tablet (10 mg total) by mouth daily. 01/17/18  Yes Tysinger, Camelia Eng, PA-C    fenofibrate (TRICOR) 145 MG tablet TAKE ONE (1) TABLET EACH DAY 01/17/18  Yes Tysinger, Camelia Eng, PA-C  irbesartan (AVAPRO) 150 MG tablet Take 1 tablet (150 mg total) by mouth daily. 01/17/18  Yes Tysinger, Camelia Eng, PA-C  levothyroxine (SYNTHROID, LEVOTHROID) 150 MCG tablet TAKE 1 TABLET BY MOUTH  DAILY 02/06/18  Yes Caryl Ada  PROAIR RESPICLICK 053 (90 Base) MCG/ACT AEPB USE 2 INHALATIONS BY MOUTH  EVERY 6 HOURS AS NEEDED 02/06/18  Yes Tysinger, Camelia Eng, PA-C  dapagliflozin propanediol (FARXIGA) 5 MG TABS tablet Take 5 mg by mouth daily. Patient not taking: Reported on 03/13/2018 01/17/18   Tysinger, Camelia Eng, PA-C  silver sulfADIAZINE (SILVADENE) 1 % cream Apply 1 application topically daily. Patient not taking: Reported on 01/14/2017 10/01/16   Tysinger, Camelia Eng, PA-C  losartan (COZAAR) 100 MG tablet Take 1 tablet (100 mg total) by mouth daily. 04/18/15 07/11/15  Martinique, Peter M, MD    Allergies as of 01/24/2018 - Review Complete 01/16/2018  Allergen Reaction Noted  . Statins  12/18/2015  . Tetracycline      Family History  Problem Relation Age of Onset  . Liver cancer Mother   . Cancer Mother   . Heart disease Father        cabg   . Heart disease  Sister        arrhythmia  . Diabetes Paternal Grandmother   . Cancer Paternal Grandfather        leukemia  . Liver disease Brother   . Asthma Brother   . Suicidality Brother   . Multiple sclerosis Sister   . Prostate cancer Neg Hx   . Kidney cancer Neg Hx     Social History   Socioeconomic History  . Marital status: Legally Separated    Spouse name: Not on file  . Number of children: 3  . Years of education: Not on file  . Highest education level: Not on file  Occupational History  . Occupation: Engineer, maintenance  Social Needs  . Financial resource strain: Not on file  . Food insecurity:    Worry: Not on file    Inability: Not on file  . Transportation needs:    Medical: Not on file    Non-medical: Not on  file  Tobacco Use  . Smoking status: Former Smoker    Packs/day: 0.50    Years: 10.00    Pack years: 5.00    Types: Cigarettes    Last attempt to quit: 04/08/2015    Years since quitting: 2.9  . Smokeless tobacco: Never Used  Substance and Sexual Activity  . Alcohol use: Yes    Alcohol/week: 5.0 standard drinks    Types: 5 Cans of beer per week    Comment: occ.  . Drug use: No  . Sexual activity: Not on file  Lifestyle  . Physical activity:    Days per week: Not on file    Minutes per session: Not on file  . Stress: Not on file  Relationships  . Social connections:    Talks on phone: Not on file    Gets together: Not on file    Attends religious service: Not on file    Active member of club or organization: Not on file    Attends meetings of clubs or organizations: Not on file    Relationship status: Not on file  . Intimate partner violence:    Fear of current or ex partner: Not on file    Emotionally abused: Not on file    Physically abused: Not on file    Forced sexual activity: Not on file  Other Topics Concern  . Not on file  Social History Narrative   Lives with wife, 3 dogs, 3 children, and 2 twins .  Exercise - walking , hunting.   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   As of November/2019    Review of Systems: See HPI, otherwise negative ROS  Physical Exam: BP 138/79   Pulse (!) 51   Temp (!) 97.2 F (36.2 C) (Temporal)   Resp 16   Ht 5\' 9"  (1.753 m)   Wt 95.3 kg   SpO2 97%   BMI 31.01 kg/m  General:   Alert,  pleasant and cooperative in NAD Head:  Normocephalic and atraumatic. Neck:  Supple; no masses or thyromegaly. Lungs:  Clear throughout to auscultation.    Heart:  Regular rate and rhythm. Abdomen:  Soft, nontender and nondistended. Normal bowel sounds, without guarding, and without rebound.   Neurologic:  Alert and  oriented x4;  grossly normal neurologically.  Impression/Plan: Todd Kelley is now here to undergo a screening  colonoscopy.  Risks, benefits, and alternatives regarding colonoscopy have been reviewed with the patient.  Questions have been answered.  All parties agreeable.

## 2018-03-17 NOTE — Op Note (Signed)
[facility] Gastroenterology Patient Name: Todd Kelley Procedure Date: 03/17/2018 8:34 AM MRN: [id number] Account #: [id number] Date of Birth: [birth year] Admit Type: Outpatient Age: 53 Room: [facility] OR ROOM 01 Gender: Male Note Status: Finalized Procedure:            Colonoscopy Indications:          Screening for colorectal malignant neoplasm Providers:            Lucilla Lame MD, MD Referring MD:         Camelia Eng. Tysinger PA-C, PA (Referring MD) Medicines:            Propofol per Anesthesia Complications:        No immediate complications. Procedure:            Pre-Anesthesia Assessment:                       - Prior to the procedure, a History and Physical was                        performed, and patient medications and allergies were                        reviewed. The patient's tolerance of previous                        anesthesia was also reviewed. The risks and benefits of                        the procedure and the sedation options and risks were                        discussed with the patient. All questions were                        answered, and informed consent was obtained. Prior                        Anticoagulants: The patient has taken no previous                        anticoagulant or antiplatelet agents. ASA Grade                        Assessment: II - A patient with mild systemic disease.                        After reviewing the risks and benefits, the patient was                        deemed in satisfactory condition to undergo the                        procedure.                       After obtaining informed consent, the colonoscope was                        passed under direct vision. Throughout the procedure,  the patient's blood pressure, pulse, and oxygen                        saturations were monitored continuously. The was                        introduced through the anus and advanced to the the                   cecum, identified by appendiceal orifice and ileocecal                        valve. The colonoscopy was performed without                        difficulty. The patient tolerated the procedure well.                        The quality of the bowel preparation was excellent. Findings:      The perianal and digital rectal examinations were normal.      A 3 mm polyp was found in the transverse colon. The polyp was sessile.       The polyp was removed with a cold biopsy forceps. Resection and       retrieval were complete.      Non-bleeding internal hemorrhoids were found during retroflexion. The       hemorrhoids were Grade I (internal hemorrhoids that do not prolapse). Impression:           - One 3 mm polyp in the transverse colon, removed with                        a cold biopsy forceps. Resected and retrieved.                       - Non-bleeding internal hemorrhoids. Recommendation:       - Discharge patient to home.                       - Resume previous diet.                       - Continue present medications.                       - Await pathology results.                       - Repeat colonoscopy in 5 years if polyp adenoma and 10                        years if hyperplastic Procedure Code(s):    --- Professional ---                       [phone], Colonoscopy, flexible; with biopsy, single or                        multiple Diagnosis Code(s):    --- Professional ---                       Z12.11, Encounter for screening for malignant neoplasm  of colon                       D12.3, Benign neoplasm of transverse colon (hepatic                        flexure or splenic flexure) CPT copyright 2018 American Medical Association. All rights reserved. The codes documented in this report are preliminary and upon coder review may  be revised to meet current compliance requirements. Lucilla Lame MD, MD 03/17/2018 8:54:11 AM This report has been signed  electronically. Number of Addenda: 0 Note Initiated On: 03/17/2018 8:34 AM Scope Withdrawal Time: 0 hours 6 minutes 56 seconds  Total Procedure Duration: 0 hours 8 minutes 52 seconds       [facility]

## 2018-03-20 ENCOUNTER — Encounter: Payer: Self-pay | Admitting: Gastroenterology

## 2018-03-21 ENCOUNTER — Encounter: Payer: Self-pay | Admitting: Gastroenterology

## 2018-03-23 ENCOUNTER — Encounter: Payer: Self-pay | Admitting: Gastroenterology

## 2018-03-27 ENCOUNTER — Encounter: Payer: Self-pay | Admitting: Medical

## 2018-03-27 ENCOUNTER — Ambulatory Visit (INDEPENDENT_AMBULATORY_CARE_PROVIDER_SITE_OTHER): Payer: BLUE CROSS/BLUE SHIELD | Admitting: Medical

## 2018-03-27 VITALS — BP 130/88 | HR 75 | Temp 98.0°F | Resp 16 | Ht 70.0 in | Wt 216.6 lb

## 2018-03-27 DIAGNOSIS — R112 Nausea with vomiting, unspecified: Secondary | ICD-10-CM

## 2018-03-27 DIAGNOSIS — E039 Hypothyroidism, unspecified: Secondary | ICD-10-CM

## 2018-03-27 DIAGNOSIS — I10 Essential (primary) hypertension: Secondary | ICD-10-CM

## 2018-03-27 DIAGNOSIS — R109 Unspecified abdominal pain: Secondary | ICD-10-CM | POA: Diagnosis not present

## 2018-03-27 LAB — CBC WITH DIFFERENTIAL/PLATELET
Basophils Absolute: 0 10*3/uL (ref 0.0–0.2)
Basos: 0 %
EOS (ABSOLUTE): 0.7 10*3/uL — ABNORMAL HIGH (ref 0.0–0.4)
Eos: 6 %
Hematocrit: 48.4 % (ref 37.5–51.0)
Hemoglobin: 16.6 g/dL (ref 13.0–17.7)
Lymphocytes Absolute: 1.9 10*3/uL (ref 0.7–3.1)
Lymphs: 16 %
MCH: 29.2 pg (ref 26.6–33.0)
MCHC: 34.3 g/dL (ref 31.5–35.7)
MCV: 85 fL (ref 79–97)
Monocytes Absolute: 0.8 10*3/uL (ref 0.1–0.9)
Monocytes: 7 %
Neutrophils Absolute: 8.4 10*3/uL — ABNORMAL HIGH (ref 1.4–7.0)
Neutrophils: 71 %
PLATELETS: 240 10*3/uL (ref 150–450)
RBC: 5.69 x10E6/uL (ref 4.14–5.80)
RDW: 14.2 % (ref 11.6–15.4)
WBC: 11.9 10*3/uL — ABNORMAL HIGH (ref 3.4–10.8)

## 2018-03-27 LAB — COMPREHENSIVE METABOLIC PANEL
ALT: 17 IU/L (ref 0–44)
AST: 14 IU/L (ref 0–40)
Albumin/Globulin Ratio: 1.8 (ref 1.2–2.2)
Albumin: 4.6 g/dL (ref 3.8–4.9)
Alkaline Phosphatase: 73 IU/L (ref 39–117)
BUN/Creatinine Ratio: 15 (ref 9–20)
BUN: 18 mg/dL (ref 6–24)
Bilirubin Total: 0.5 mg/dL (ref 0.0–1.2)
CO2: 27 mmol/L (ref 20–29)
Calcium: 10.1 mg/dL (ref 8.7–10.2)
Chloride: 105 mmol/L (ref 96–106)
Creatinine, Ser: 1.17 mg/dL (ref 0.76–1.27)
GFR calc Af Amer: 81 mL/min/{1.73_m2} (ref >59)
GFR calc non Af Amer: 70 mL/min/{1.73_m2} (ref >59)
Globulin, Total: 2.5 g/dL (ref 1.5–4.5)
Glucose: 119 mg/dL — ABNORMAL HIGH (ref 65–99)
Potassium: 4.1 mmol/L (ref 3.5–5.2)
Sodium: 140 mmol/L (ref 134–144)
Total Protein: 7.1 g/dL (ref 6.0–8.5)

## 2018-03-27 LAB — LIPASE: Lipase: 41 U/L (ref 13–78)

## 2018-03-27 MED ORDER — ONDANSETRON HCL 4 MG PO TABS: 4.0000 mg | ORAL_TABLET | Freq: Three times a day (TID) | ORAL | 0 refills | Status: DC | PRN

## 2018-03-27 MED ORDER — PROMETHAZINE HCL 25 MG/ML IJ SOLN
25.0000 mg | Freq: Once | INTRAMUSCULAR | Status: AC
  Administered 2018-03-27: 25 mg via INTRAMUSCULAR

## 2018-03-27 NOTE — Progress Notes (Addendum)
Subjective: Chief Complaint  Patient presents with  . diarrhea/vomitting    vomit and diarrhea X this morning, every week or two    Here with wife today.  Here for nausea, vomiting, and loose stools.   Got sick 6 days ago in the middle of the night, vomited twice.  Had diarrhea for 2 more days.   Then everything calmed down for the weekend.  Then this morning got up sick again, started with diarrhea this morning, started vomited today as well.  Vomited 3 times today.   Over Christmas had several episodes of diarrhea for a day.  Has had periodic episodes of diarrhea and vomiting.   No polyuria, no polydipsia, no fever, but was cold and clammy yesterday.  Sore from vomiting, but no other body aches.   No sick contacts with similar.     He notes some sneezing, but no other URI symptoms.  No chest pain, no dyspnea, no wheezing.   No new back pain.  Has had somewhat central abdominal pain.  No blood in stool, no blood in urine.  No mucous in stool.  No recent travel, no urinary symptoms otherwise   Using no medications for current symptoms.   He has had some excess GERD of late.   Last night ate shredded wheat and popcorn.  No concern for recent food poisoning or spoiled food.   No other aggravating or relieving factors. No other complaint.   Past Medical History:  Diagnosis Date  . Acid reflux   . Allergic asthma    rare flare up, worse with alllergies as of 12/2016  . Coronary artery disease   . DVT (deep venous thrombosis) ([place]) 01/2001   after CABG  . Dyslipidemia   . Hyperlipidemia   . Hypertension   . Hypothyroidism   . Impaired fasting blood sugar   . MI (myocardial infarction) ([place]) july of 2000   stent  . Palpitations    hx of  . Thyroid disease    hypothyroidism  . Tobacco use    vape, no cigarrette since 04/2015   Current Outpatient Medications on File Prior to Visit  Medication Sig Dispense Refill  . aspirin EC 81 MG tablet Take 1 tablet (81 mg total) by mouth daily. 90  tablet 3  . atorvastatin (LIPITOR) 20 MG tablet Take 1 tablet (20 mg total) by mouth daily. 90 tablet 3  . cetirizine (ZYRTEC) 10 MG tablet Take 10 mg by mouth daily as needed for allergies.    . CIALIS 5 MG tablet Take 1 tablet (5 mg total) by mouth daily. 30 tablet 5  . escitalopram (LEXAPRO) 10 MG tablet Take 1 tablet (10 mg total) by mouth daily. 90 tablet 3  . fenofibrate (TRICOR) 145 MG tablet TAKE ONE (1) TABLET EACH DAY 90 tablet 3  . irbesartan (AVAPRO) 150 MG tablet Take 1 tablet (150 mg total) by mouth daily. 90 tablet 3  . levothyroxine (SYNTHROID, LEVOTHROID) 150 MCG tablet TAKE 1 TABLET BY MOUTH  DAILY 90 tablet 3  . PROAIR RESPICLICK 841 (90 Base) MCG/ACT AEPB USE 2 INHALATIONS BY MOUTH  EVERY 6 HOURS AS NEEDED 1 each 0  . dapagliflozin propanediol (FARXIGA) 5 MG TABS tablet Take 5 mg by mouth daily. (Patient not taking: Reported on 03/13/2018) 90 tablet 1  . silver sulfADIAZINE (SILVADENE) 1 % cream Apply 1 application topically daily. (Patient not taking: Reported on 01/14/2017) 50 g 0  . [DISCONTINUED] losartan (COZAAR) 100 MG tablet Take 1 tablet (  100 mg total) by mouth daily. 90 tablet 3   No current facility-administered medications on file prior to visit.    ROS as in subjective     Objective: BP (!) 146/100   Pulse 65   Temp 98 F (36.7 C) (Oral)   Resp 16   Ht 5\' 10"  (1.778 m)   Wt 216 lb 9.6 oz (98.2 kg)   SpO2 97%   BMI 31.08 kg/m    Wt Readings from Last 3 Encounters:  03/27/18 216 lb 9.6 oz (98.2 kg)  03/17/18 210 lb (95.3 kg)  01/16/18 218 lb (98.9 kg)   BP Readings from Last 3 Encounters:  03/27/18 (!) 146/100  03/17/18 110/76  01/16/18 130/82   General appearance: alert, WD/WN, conversing, but vomited while I was in the room HEENT: normocephalic, sclerae anicteric, TMs pearly, nares patent, no discharge or erythema, pharynx normal Oral cavity: MMM, no lesions Neck: supple, no lymphadenopathy, no thyromegaly, no masses Heart: RRR, normal S1,  S2, no murmurs Lungs: CTA bilaterally, no wheezes, rhonchi, or rales Abdomen: +normal bs, soft, tender central abdomen, otherwise non tender, non distended, no masses, no hepatomegaly, no splenomegaly Pulses: 2+ symmetric, upper and lower extremities, normal cap refill Ext: no edema   EKG done due to GERD, nausea, hx/o heart disease Rate 69 bpm Nsr, possible left atrial enlargement Left anterior fascicular block T wave abnormality, consider lateral ischemia PR 164 ms QRS 57ms QTC 381ms Axis -56 degrees No change from prior EKG 12/2017   Assessment: Encounter Diagnoses  Name Primary?  . Intractable vomiting with nausea, unspecified vomiting type Yes  . Abdominal pain, unspecified abdominal location   . Essential hypertension   . Hypothyroidism (acquired)      Plan: Discussed symptoms, concerns.  Discussed possible differential including gastroenteritis, bowel infection, colitis, severe GERD, food allergy, thyroiditis, other.  Of note when he had his initial heart disease diagnosis back in 2002 he ruptured his esophagus due to uncontrollable vomiting and was found to have heart attack.  I reviewed his EKG today.  No changes from prior EKG.  After initial triage, gave 25mg  promethazine IM in office.   STAT labs today.   Counseled on hydration, can use Zofran for nausea and vomiting at home.    He stopped Farxiga > 1 weeks ago due to concern for side effects noted on label, kidney risk.     We will get stat labs, discussed hydration, use brat diet if he sees improvement with follow-up in the coming hours but keep this small portion and brat type foods, bananas, rice, applesauce, toast, soup etc.  We will call with lab results.  Advised if much worse in the coming hours go to emergency department   Todd Kelley was seen today for diarrhea/vomitting.  Diagnoses and all orders for this visit:  Intractable vomiting with nausea, unspecified vomiting type -     CBC with  Differential/Platelet -     Comprehensive metabolic panel -     Lipase  Abdominal pain, unspecified abdominal location -     CBC with Differential/Platelet -     Comprehensive metabolic panel -     Lipase  Essential hypertension  Hypothyroidism (acquired) -     TSH -     T4, free

## 2018-03-28 LAB — T4, FREE: Free T4: 1.72 ng/dL (ref 0.82–1.77)

## 2018-03-28 LAB — TSH: TSH: 0.595 u[IU]/mL (ref 0.450–4.500)

## 2018-03-29 ENCOUNTER — Emergency Department: Payer: BLUE CROSS/BLUE SHIELD

## 2018-03-29 ENCOUNTER — Emergency Department
Admission: EM | Admit: 2018-03-29 | Discharge: 2018-03-29 | Disposition: A | Discharge status: Home / Self Care | Payer: BLUE CROSS/BLUE SHIELD | Attending: Emergency Medicine | Admitting: Emergency Medicine

## 2018-03-29 ENCOUNTER — Other Ambulatory Visit: Payer: Self-pay

## 2018-03-29 ENCOUNTER — Encounter: Payer: Self-pay | Admitting: Emergency Medicine

## 2018-03-29 DIAGNOSIS — R197 Diarrhea, unspecified: Secondary | ICD-10-CM | POA: Insufficient documentation

## 2018-03-29 DIAGNOSIS — Z951 Presence of aortocoronary bypass graft: Secondary | ICD-10-CM | POA: Insufficient documentation

## 2018-03-29 DIAGNOSIS — Z87891 Personal history of nicotine dependence: Secondary | ICD-10-CM | POA: Insufficient documentation

## 2018-03-29 DIAGNOSIS — I10 Essential (primary) hypertension: Secondary | ICD-10-CM | POA: Diagnosis not present

## 2018-03-29 DIAGNOSIS — R112 Nausea with vomiting, unspecified: Secondary | ICD-10-CM | POA: Diagnosis not present

## 2018-03-29 DIAGNOSIS — R1084 Generalized abdominal pain: Secondary | ICD-10-CM

## 2018-03-29 DIAGNOSIS — E039 Hypothyroidism, unspecified: Secondary | ICD-10-CM | POA: Insufficient documentation

## 2018-03-29 DIAGNOSIS — Z7982 Long term (current) use of aspirin: Secondary | ICD-10-CM | POA: Insufficient documentation

## 2018-03-29 DIAGNOSIS — I252 Old myocardial infarction: Secondary | ICD-10-CM | POA: Diagnosis not present

## 2018-03-29 DIAGNOSIS — Z955 Presence of coronary angioplasty implant and graft: Secondary | ICD-10-CM | POA: Diagnosis not present

## 2018-03-29 DIAGNOSIS — I2510 Atherosclerotic heart disease of native coronary artery without angina pectoris: Secondary | ICD-10-CM | POA: Insufficient documentation

## 2018-03-29 DIAGNOSIS — Z79899 Other long term (current) drug therapy: Secondary | ICD-10-CM | POA: Diagnosis not present

## 2018-03-29 DIAGNOSIS — J45909 Unspecified asthma, uncomplicated: Secondary | ICD-10-CM | POA: Insufficient documentation

## 2018-03-29 DIAGNOSIS — K7689 Other specified diseases of liver: Secondary | ICD-10-CM | POA: Diagnosis not present

## 2018-03-29 DIAGNOSIS — R1011 Right upper quadrant pain: Secondary | ICD-10-CM | POA: Diagnosis not present

## 2018-03-29 LAB — GASTROINTESTINAL PANEL BY PCR, STOOL (REPLACES STOOL CULTURE)
Adenovirus F40/41: NOT DETECTED
Astrovirus: NOT DETECTED
Campylobacter species: NOT DETECTED
Cryptosporidium: NOT DETECTED
Cyclospora cayetanensis: NOT DETECTED
ENTEROAGGREGATIVE E COLI (EAEC): NOT DETECTED
Entamoeba histolytica: NOT DETECTED
Enteropathogenic E coli (EPEC): NOT DETECTED
Enterotoxigenic E coli (ETEC): NOT DETECTED
GIARDIA LAMBLIA: NOT DETECTED
Norovirus GI/GII: NOT DETECTED
Plesimonas shigelloides: NOT DETECTED
Rotavirus A: NOT DETECTED
Salmonella species: NOT DETECTED
Sapovirus (I, II, IV, and V): NOT DETECTED
Shiga like toxin producing E coli (STEC): NOT DETECTED
Shigella/Enteroinvasive E coli (EIEC): NOT DETECTED
VIBRIO SPECIES: NOT DETECTED
Vibrio cholerae: NOT DETECTED
Yersinia enterocolitica: NOT DETECTED

## 2018-03-29 LAB — CBC
HCT: 47.8 % (ref 39.0–52.0)
Hemoglobin: 16.1 g/dL (ref 13.0–17.0)
MCH: 29 pg (ref 26.0–34.0)
MCHC: 33.7 g/dL (ref 30.0–36.0)
MCV: 86 fL (ref 80.0–100.0)
NRBC: 0 % (ref 0.0–0.2)
Platelets: 212 10*3/uL (ref 150–400)
RBC: 5.56 MIL/uL (ref 4.22–5.81)
RDW: 12.4 % (ref 11.5–15.5)
WBC: 9.8 10*3/uL (ref 4.0–10.5)

## 2018-03-29 LAB — COMPREHENSIVE METABOLIC PANEL
ALT: 128 U/L — ABNORMAL HIGH (ref 0–44)
AST: 83 U/L — ABNORMAL HIGH (ref 15–41)
Albumin: 3.8 g/dL (ref 3.5–5.0)
Alkaline Phosphatase: 63 U/L (ref 38–126)
Anion gap: 7 (ref 5–15)
BUN: 12 mg/dL (ref 6–20)
CO2: 22 mmol/L (ref 22–32)
Calcium: 9 mg/dL (ref 8.9–10.3)
Chloride: 105 mmol/L (ref 98–111)
Creatinine, Ser: 1.24 mg/dL (ref 0.61–1.24)
GFR calc Af Amer: >60 mL/min (ref >60)
Glucose, Bld: 167 mg/dL — ABNORMAL HIGH (ref 70–99)
POTASSIUM: 4.1 mmol/L (ref 3.5–5.1)
Sodium: 134 mmol/L — ABNORMAL LOW (ref 135–145)
Total Bilirubin: 0.8 mg/dL (ref 0.3–1.2)
Total Protein: 7.1 g/dL (ref 6.5–8.1)

## 2018-03-29 LAB — URINALYSIS, COMPLETE (UACMP) WITH MICROSCOPIC
BILIRUBIN URINE: NEGATIVE
Bacteria, UA: NONE SEEN
Glucose, UA: NEGATIVE mg/dL
Hgb urine dipstick: NEGATIVE
Ketones, ur: NEGATIVE mg/dL
LEUKOCYTES UA: NEGATIVE
Nitrite: NEGATIVE
Protein, ur: NEGATIVE mg/dL
Specific Gravity, Urine: 1.009 (ref 1.005–1.030)
Squamous Epithelial / HPF: NONE SEEN (ref 0–5)
pH: 6 (ref 5.0–8.0)

## 2018-03-29 LAB — LIPASE, BLOOD: Lipase: 31 U/L (ref 11–51)

## 2018-03-29 LAB — C DIFFICILE QUICK SCREEN W PCR REFLEX
C Diff antigen: NEGATIVE
C Diff interpretation: NOT DETECTED
C Diff toxin: NEGATIVE

## 2018-03-29 MED ORDER — SODIUM CHLORIDE 0.9% FLUSH: 3.0000 mL | Freq: Once | INTRAVENOUS | Status: DC

## 2018-03-29 MED ORDER — LOPERAMIDE HCL 2 MG PO TABS: 4.0000 mg | ORAL_TABLET | Freq: Four times a day (QID) | ORAL | 0 refills | Status: DC | PRN

## 2018-03-29 MED ORDER — DICYCLOMINE HCL 20 MG PO TABS: 20.0000 mg | ORAL_TABLET | Freq: Three times a day (TID) | ORAL | 0 refills | Status: DC | PRN

## 2018-03-29 MED ORDER — ONDANSETRON 8 MG PO TBDP: 8.0000 mg | ORAL_TABLET | Freq: Three times a day (TID) | ORAL | 0 refills | Status: DC | PRN

## 2018-03-29 MED ORDER — SODIUM CHLORIDE 0.9 % IV SOLN
Freq: Once | INTRAVENOUS | Status: AC
  Administered 2018-03-29: 11:00:00 via INTRAVENOUS

## 2018-03-29 MED ORDER — NAPROXEN 500 MG PO TABS: 500.0000 mg | ORAL_TABLET | Freq: Two times a day (BID) | ORAL | 0 refills | Status: DC

## 2018-03-29 NOTE — Addendum Note (Signed)
Addended by: Carlena Hurl on: 03/29/2018 08:17 AM   Modules accepted: Orders

## 2018-03-29 NOTE — ED Provider Notes (Signed)
[facility] Emergency Department Provider Note  ____________________________________________  Time seen: Approximately 3:47 PM  I have reviewed the triage vital signs and the nursing notes.   HISTORY  Chief Complaint Emesis and Diarrhea    HPI Todd Kelley is a 53 y.o. male with a history of  CAD status post CABG, hypertension, hypothyroidism who complains of abdominal pain vomiting and diarrhea for the past 2 weeks.  Episodes are intermittent, not particularly worsened by food, no aggravating or alleviating factors.  No black or bloody stool.  Does not feel that stools particularly pale.  Pain is generalized when present and nonradiating.  Currently improved.  Had a colonoscopy by gastroenterology 2 weeks ago which was reported as unremarkable.     Past Medical History:  Diagnosis Date  . Acid reflux   . Allergic asthma    rare flare up, worse with alllergies as of 12/2016  . Coronary artery disease   . DVT (deep venous thrombosis) ([place]) 01/2001   after CABG  . Dyslipidemia   . Hyperlipidemia   . Hypertension   . Hypothyroidism   . Impaired fasting blood sugar   . MI (myocardial infarction) ([place]) july of 2000   stent  . Palpitations    hx of  . Thyroid disease    hypothyroidism  . Tobacco use    vape, no cigarrette since 04/2015     Patient Active Problem List   Diagnosis Date Noted  . Encounter for screening colonoscopy   . Benign neoplasm of transverse colon   . Dyspnea 01/16/2018  . Allergic rhinitis 01/16/2018  . Insomnia 01/16/2018  . Routine general medical examination at a health care facility 01/14/2017  . Vaccine counseling 01/14/2017  . Need for influenza vaccination 01/14/2017  . History of MI (myocardial infarction) 12/18/2015  . Screening for prostate cancer 12/18/2015  . Impaired fasting blood sugar 12/18/2015  . Statin-induced myositis 12/18/2015  . Erectile dysfunction 11/18/2015  . Hypothyroidism (acquired)  11/18/2015  . Essential hypertension 11/18/2015  . BPH (benign prostatic hyperplasia) 11/18/2015  . S/P CABG (coronary artery bypass graft) 03/31/2011  . Coronary artery disease   . Dyslipidemia   . Acid reflux      Past Surgical History:  Procedure Laterality Date  . APPENDECTOMY  2001  . CARDIAC CATHETERIZATION  2000   stent  . COLONOSCOPY WITH PROPOFOL N/A 03/17/2018   Procedure: COLONOSCOPY WITH PROPOFOL;  Surgeon: Lucilla Lame, MD;  Location: [place] [facility];  Service: Endoscopy;  Laterality: N/A;  . CORONARY ANGIOPLASTY  jan. 2001  . CORONARY ANGIOPLASTY WITH STENT PLACEMENT  june 2001   stent  . CORONARY ARTERY BYPASS GRAFT  dec. 2002   LIMA graft to LAD,RIMA graft to the intermediate branch and saphenous vein graft  to the posterior lateral and posterior descending branches of the right coronary  . POLYPECTOMY  03/17/2018   Procedure: POLYPECTOMY;  Surgeon: Lucilla Lame, MD;  Location: [place];  Service: Endoscopy;;     Prior to Admission medications   Medication Sig Start Date End Date Taking? Authorizing Provider  aspirin EC 81 MG tablet Take 1 tablet (81 mg total) by mouth daily. 01/17/18  Yes Tysinger, Camelia Eng, PA-C  atorvastatin (LIPITOR) 20 MG tablet Take 1 tablet (20 mg total) by mouth daily. 01/17/18 01/17/19 Yes Tysinger, Camelia Eng, PA-C  cetirizine (ZYRTEC) 10 MG tablet Take 10 mg by mouth daily as needed for allergies.   Yes [provider]  CIALIS 5 MG tablet  Take 1 tablet (5 mg total) by mouth daily. 01/17/18  Yes Tysinger, Camelia Eng, PA-C  escitalopram (LEXAPRO) 10 MG tablet Take 1 tablet (10 mg total) by mouth daily. 01/17/18  Yes Tysinger, Camelia Eng, PA-C  fenofibrate (TRICOR) 145 MG tablet TAKE ONE (1) TABLET EACH DAY 01/17/18  Yes Tysinger, Camelia Eng, PA-C  irbesartan (AVAPRO) 150 MG tablet Take 1 tablet (150 mg total) by mouth daily. 01/17/18  Yes Tysinger, Camelia Eng, PA-C  levothyroxine (SYNTHROID, LEVOTHROID) 150 MCG tablet TAKE 1 TABLET  BY MOUTH  DAILY Patient taking differently: Take 150 mcg by mouth daily before breakfast.  02/06/18  Yes Tysinger, Camelia Eng, PA-C  ondansetron (ZOFRAN) 4 MG tablet Take 1 tablet (4 mg total) by mouth every 8 (eight) hours as needed for nausea or vomiting. 03/27/18  Yes Tysinger, Camelia Eng, PA-C  PROAIR RESPICLICK 762 (90 Base) MCG/ACT AEPB USE 2 INHALATIONS BY MOUTH  EVERY 6 HOURS AS NEEDED 02/06/18  Yes Tysinger, Camelia Eng, PA-C  dicyclomine (BENTYL) 20 MG tablet Take 1 tablet (20 mg total) by mouth 3 (three) times daily as needed for spasms. 03/29/18   Carrie Mew, MD  loperamide (IMODIUM A-D) 2 MG tablet Take 2 tablets (4 mg total) by mouth 4 (four) times daily as needed for diarrhea or loose stools. 03/29/18   Carrie Mew, MD  naproxen (NAPROSYN) 500 MG tablet Take 1 tablet (500 mg total) by mouth 2 (two) times daily with a meal. 03/29/18   Carrie Mew, MD  ondansetron (ZOFRAN ODT) 8 MG disintegrating tablet Take 1 tablet (8 mg total) by mouth every 8 (eight) hours as needed for nausea or vomiting. 03/29/18   Carrie Mew, MD  losartan (COZAAR) 100 MG tablet Take 1 tablet (100 mg total) by mouth daily. 04/18/15 07/11/15  Martinique, Peter M, MD     Allergies Statins and Tetracycline   Family History  Problem Relation Age of Onset  . Liver cancer Mother   . Cancer Mother   . Heart disease Father        cabg   . Heart disease Sister        arrhythmia  . Diabetes Paternal Grandmother   . Cancer Paternal Grandfather        leukemia  . Liver disease Brother   . Asthma Brother   . Suicidality Brother   . Multiple sclerosis Sister   . Prostate cancer Neg Hx   . Kidney cancer Neg Hx     Social History Social History   Tobacco Use  . Smoking status: Former Smoker    Packs/day: 0.50    Years: 10.00    Pack years: 5.00    Types: Cigarettes    Last attempt to quit: 04/08/2015    Years since quitting: 2.9  . Smokeless tobacco: Never Used  Substance Use Topics  . Alcohol use:  Yes    Alcohol/week: 5.0 standard drinks    Types: 5 Cans of beer per week    Comment: occ.  . Drug use: No    Review of Systems  Constitutional:   No fever or chills.  ENT:   No sore throat. No rhinorrhea. Cardiovascular:   No chest pain or syncope. Respiratory:   No dyspnea or cough. Gastrointestinal:   Positive as above intermittently for abdominal pain, vomiting and diarrhea.  Musculoskeletal:   Negative for focal pain or swelling All other systems reviewed and are negative except as documented above in ROS and HPI.  ____________________________________________   PHYSICAL EXAM:  VITAL SIGNS: ED Triage Vitals  Enc Vitals Group     BP 03/29/18 0855 136/81     Pulse Rate 03/29/18 0855 69     Resp 03/29/18 0855 16     Temp 03/29/18 0855 98.3 F (36.8 C)     Temp Source 03/29/18 0855 Oral     SpO2 03/29/18 0855 98 %     Weight --      Height --      Head Circumference --      Peak Flow --      Pain Score 03/29/18 0853 6     Pain Loc --      Pain Edu? --      Excl. in [place]? --     Vital signs reviewed, nursing assessments reviewed.   Constitutional:   Alert and oriented. Non-toxic appearance. Eyes:   Conjunctivae are normal. EOMI. PERRL. ENT      Head:   Normocephalic and atraumatic.      Nose:   No congestion/rhinnorhea.       Mouth/Throat:   Dry mucous membranes, no pharyngeal erythema. No peritonsillar mass.       Neck:   No meningismus. Full ROM. Hematological/Lymphatic/Immunilogical:   No cervical lymphadenopathy. Cardiovascular:   RRR. Symmetric bilateral radial and DP pulses.  No murmurs. Cap refill less than 2 seconds. Respiratory:   Normal respiratory effort without tachypnea/retractions. Breath sounds are clear and equal bilaterally. No wheezes/rales/rhonchi. Gastrointestinal:   Soft and nontender. Non distended. There is no CVA tenderness.  No rebound, rigidity, or guarding. Musculoskeletal:   Normal range of motion in all extremities. No joint  effusions.  No lower extremity tenderness.  No edema. Neurologic:   Normal speech and language.  Motor grossly intact. No acute focal neurologic deficits are appreciated.  Skin:    Skin is warm, dry and intact. No rash noted.  No petechiae, purpura, or bullae.  ____________________________________________    LABS (pertinent positives/negatives) (all labs ordered are listed, but only abnormal results are displayed) Labs Reviewed  COMPREHENSIVE METABOLIC PANEL - Abnormal; Notable for the following components:      Result Value   Sodium 134 (*)    Glucose, Bld 167 (*)    AST 83 (*)    ALT 128 (*)    All other components within normal limits  URINALYSIS, COMPLETE (UACMP) WITH MICROSCOPIC - Abnormal; Notable for the following components:   Color, Urine YELLOW (*)    APPearance CLEAR (*)    All other components within normal limits  GASTROINTESTINAL PANEL BY PCR, STOOL (REPLACES STOOL CULTURE)  C DIFFICILE QUICK SCREEN W PCR REFLEX  LIPASE, BLOOD  CBC   ____________________________________________   EKG    ____________________________________________    RADIOLOGY  US Abdomen Limited Ruq  Result Date: 03/29/2018 CLINICAL DATA:  53 year old male with a history of vomiting and diarrhea for 2 weeks EXAM: ULTRASOUND ABDOMEN LIMITED RIGHT UPPER QUADRANT COMPARISON:  None. FINDINGS: Gallbladder: No gallstones or wall thickening visualized. No sonographic Murphy sign noted by sonographer. Common bile duct: Diameter: 3 mm Liver: Heterogeneous echotexture of liver parenchyma, somewhat increased. No nodular contour. No focal lesion. Portal vein is patent on color Doppler imaging with normal direction of blood flow towards the liver. IMPRESSION: Unremarkable appearance of the gallbladder. Increased echogenicity of the liver, may represent medical liver disease. Electronically Signed   By: Corrie Mckusick D.O.   On: 03/29/2018 12:34     ____________________________________________   PROCEDURES Procedures  ____________________________________________  DIFFERENTIAL DIAGNOSIS  Cholelithiasis, choledocholithiasis, gastritis, IBD, IBS.  Doubt perforation obstruction colitis diverticulitis cystitis pyelonephritis or ureterolithiasis.  Doubt AAA or dissection or mesenteric ischemia  CLINICAL IMPRESSION / ASSESSMENT AND PLAN / ED COURSE  Pertinent labs & imaging results that were available during my care of the patient were reviewed by me and considered in my medical decision making (see chart for details).    Patient presents with intermittent episodes of abdominal pain with vomiting and diarrhea.  Patient provided a stool sample in the ED which looks like normal brown stool.  Vital signs normal.  Labs show leukocytosis of 15,000 without any other focal symptoms.  Exam is reassuring.  Other labs negative, GI panel and C. difficile negative.  Recommend close follow-up with gastroenterology, no other imaging needed at this time.  Symptomatic management with Imodium, Zofran, NSAIDs.      ____________________________________________   FINAL CLINICAL IMPRESSION(S) / ED DIAGNOSES    Final diagnoses:  Nausea vomiting and diarrhea  Generalized abdominal pain     ED Discharge Orders         Ordered    naproxen (NAPROSYN) 500 MG tablet  2 times daily with meals     03/29/18 1545    loperamide (IMODIUM A-D) 2 MG tablet  4 times daily PRN     03/29/18 1545    dicyclomine (BENTYL) 20 MG tablet  3 times daily PRN     03/29/18 1545    ondansetron (ZOFRAN ODT) 8 MG disintegrating tablet  Every 8 hours PRN     03/29/18 1545          Portions of this note were generated with dragon dictation software. Dictation errors may occur despite best attempts at proofreading.   Carrie Mew, MD 03/29/18 [phone]

## 2018-03-29 NOTE — ED Notes (Signed)
MD at bedside to update patient

## 2018-03-29 NOTE — Discharge Instructions (Signed)
Your labs and ultrasound of the gallbladder and liver today were unremarkable.  A panel for infectious diseases was also negative.  Please follow-up with your gastroenterologist for continued evaluation of the symptoms.  Return to the ED if your symptoms become much worse otherwise controlled them with Zofran, Imodium, and anti-inflammatory pain medicine.

## 2018-03-29 NOTE — ED Notes (Signed)
Pt updated to the best of RNs ability. Wife remains at bedside. Warm blanket given to patient and offered to wife. Pt in NAD at this time.

## 2018-03-29 NOTE — ED Triage Notes (Signed)
PT c/o vomiting and diarrhea x2wks. Followed by PCP with results of elevated WBC but no treatment given per pt. No recent use of antibiotics. PT appears fatigue. VSS

## 2018-04-12 ENCOUNTER — Ambulatory Visit (INDEPENDENT_AMBULATORY_CARE_PROVIDER_SITE_OTHER): Payer: BLUE CROSS/BLUE SHIELD | Admitting: Gastroenterology

## 2018-04-12 ENCOUNTER — Encounter (INDEPENDENT_AMBULATORY_CARE_PROVIDER_SITE_OTHER): Payer: Self-pay

## 2018-04-12 ENCOUNTER — Encounter: Payer: Self-pay | Admitting: Gastroenterology

## 2018-04-12 ENCOUNTER — Encounter

## 2018-04-12 ENCOUNTER — Other Ambulatory Visit: Payer: Self-pay

## 2018-04-12 VITALS — BP 143/81 | HR 49 | Ht 70.0 in | Wt 220.0 lb

## 2018-04-12 DIAGNOSIS — R748 Abnormal levels of other serum enzymes: Secondary | ICD-10-CM

## 2018-04-12 DIAGNOSIS — R112 Nausea with vomiting, unspecified: Secondary | ICD-10-CM

## 2018-04-12 MED ORDER — PANTOPRAZOLE SODIUM 40 MG PO TBEC: 40.0000 mg | DELAYED_RELEASE_TABLET | Freq: Every day | ORAL | 6 refills | Status: DC

## 2018-04-12 NOTE — Progress Notes (Signed)
Primary Care Physician: Carlena Hurl, PA-C  Primary Gastroenterologist:  Dr. Lucilla Lame  Chief Complaint  Patient presents with  . Emesis    HPI: Todd Kelley is a 53 y.o. male here with a report of nausea vomiting and diarrhea.  The patient thinks that it may be related to foods he eats.  The patient has had 2 episodes recently with nausea and vomiting after having a very bad night of indigestion.  The patient reports that he does not have as much heartburn has he had in the past but does report that he had suffered from heartburn many years ago.  He has not not taking the dicyclomine he was given in the past because he was not sure if it would help.  The patient has been doing well recently but states that he has episodes of diarrhea.  The diarrhea will come without warning and he has not associated with any foods.  There is no report of any abdominal pain unexplained weight loss fevers chills black stools or bloody stools.  The patient also states that his bowel movements are normal when he is not having these episodes.  Current Outpatient Medications  Medication Sig Dispense Refill  . aspirin EC 81 MG tablet Take 1 tablet (81 mg total) by mouth daily. 90 tablet 3  . atorvastatin (LIPITOR) 20 MG tablet Take 1 tablet (20 mg total) by mouth daily. 90 tablet 3  . cetirizine (ZYRTEC) 10 MG tablet Take 10 mg by mouth daily as needed for allergies.    . CIALIS 5 MG tablet Take 1 tablet (5 mg total) by mouth daily. 30 tablet 5  . dicyclomine (BENTYL) 20 MG tablet Take 1 tablet (20 mg total) by mouth 3 (three) times daily as needed for spasms. 30 tablet 0  . escitalopram (LEXAPRO) 10 MG tablet Take 1 tablet (10 mg total) by mouth daily. 90 tablet 3  . fenofibrate (TRICOR) 145 MG tablet TAKE ONE (1) TABLET EACH DAY 90 tablet 3  . irbesartan (AVAPRO) 150 MG tablet Take 1 tablet (150 mg total) by mouth daily. 90 tablet 3  . levothyroxine (SYNTHROID, LEVOTHROID) 150 MCG tablet TAKE 1 TABLET  BY MOUTH  DAILY (Patient taking differently: Take 150 mcg by mouth daily before breakfast. ) 90 tablet 3  . loperamide (IMODIUM A-D) 2 MG tablet Take 2 tablets (4 mg total) by mouth 4 (four) times daily as needed for diarrhea or loose stools. 30 tablet 0  . naproxen (NAPROSYN) 500 MG tablet Take 1 tablet (500 mg total) by mouth 2 (two) times daily with a meal. 20 tablet 0  . ondansetron (ZOFRAN ODT) 8 MG disintegrating tablet Take 1 tablet (8 mg total) by mouth every 8 (eight) hours as needed for nausea or vomiting. 20 tablet 0  . ondansetron (ZOFRAN) 4 MG tablet Take 1 tablet (4 mg total) by mouth every 8 (eight) hours as needed for nausea or vomiting. 20 tablet 0  . PROAIR RESPICLICK 132 (90 Base) MCG/ACT AEPB USE 2 INHALATIONS BY MOUTH  EVERY 6 HOURS AS NEEDED 1 each 0  . pantoprazole (PROTONIX) 40 MG tablet Take 1 tablet (40 mg total) by mouth daily. 30 tablet 6   No current facility-administered medications for this visit.     Allergies as of 04/12/2018 - Review Complete 04/12/2018  Allergen Reaction Noted  . Statins  12/18/2015  . Tetracycline      ROS:  General: Negative for anorexia, weight loss, fever, chills, fatigue,  weakness. ENT: Negative for hoarseness, difficulty swallowing , nasal congestion. CV: Negative for chest pain, angina, palpitations, dyspnea on exertion, peripheral edema.  Respiratory: Negative for dyspnea at rest, dyspnea on exertion, cough, sputum, wheezing.  GI: See history of present illness. GU:  Negative for dysuria, hematuria, urinary incontinence, urinary frequency, nocturnal urination.  Endo: Negative for unusual weight change.    Physical Examination:   BP (!) 143/81   Pulse (!) 49   Ht 5\' 10"  (1.778 m)   Wt 220 lb (99.8 kg)   BMI 31.57 kg/m   General: Well-nourished, well-developed in no acute distress.  Eyes: No icterus. Conjunctivae pink. Mouth: Oropharyngeal mucosa moist and pink , no lesions erythema or exudate. Lungs: Clear to  auscultation bilaterally. Non-labored. Heart: Regular rate and rhythm, no murmurs rubs or gallops.  Abdomen: Bowel sounds are normal, nontender, nondistended, no hepatosplenomegaly or masses, no abdominal bruits or hernia , no rebound or guarding.   Extremities: No lower extremity edema. No clubbing or deformities. Neuro: Alert and oriented x 3.  Grossly intact. Skin: Warm and dry, no jaundice.   Psych: Alert and cooperative, normal mood and affect.  Labs:    Imaging Studies: US Abdomen Limited Ruq  Result Date: 03/29/2018 CLINICAL DATA:  53 year old male with a history of vomiting and diarrhea for 2 weeks EXAM: ULTRASOUND ABDOMEN LIMITED RIGHT UPPER QUADRANT COMPARISON:  None. FINDINGS: Gallbladder: No gallstones or wall thickening visualized. No sonographic Murphy sign noted by sonographer. Common bile duct: Diameter: 3 mm Liver: Heterogeneous echotexture of liver parenchyma, somewhat increased. No nodular contour. No focal lesion. Portal vein is patent on color Doppler imaging with normal direction of blood flow towards the liver. IMPRESSION: Unremarkable appearance of the gallbladder. Increased echogenicity of the liver, may represent medical liver disease. Electronically Signed   By: Corrie Mckusick D.O.   On: 03/29/2018 12:34    Assessment and Plan:   Todd Kelley is a 53 y.o. y/o male who comes in today with a history of episodes of nausea and vomiting with intermittent diarrhea.  The patient has been told to try and avoid milk products and see if that helps with his diarrhea although is not having at the present time.  The patient will also be started on a trial of Protonix 40 mg every day to see if this helps with his symptoms since he may be having silent reflux as a cause of his nausea.  The patient had an ultrasound that showed fatty liver but did not show any problems with his gallbladder.  Patient will also have his lab sent off for repeat liver enzymes since his last check of his liver  enzymes showed elevated liver enzymes although previous to that they were normal.  The patient has been explained the plan and agrees with it.    Lucilla Lame, MD. [facility]   Note: This dictation was prepared with Dragon dictation along with smaller phrase technology. Any transcriptional errors that result from this process are unintentional.

## 2018-04-14 DIAGNOSIS — R748 Abnormal levels of other serum enzymes: Secondary | ICD-10-CM | POA: Diagnosis not present

## 2018-04-15 LAB — HEPATIC FUNCTION PANEL
ALT: 17 IU/L (ref 0–44)
AST: 13 IU/L (ref 0–40)
Albumin: 4.4 g/dL (ref 3.8–4.9)
Alkaline Phosphatase: 56 IU/L (ref 39–117)
Bilirubin Total: 0.4 mg/dL (ref 0.0–1.2)
Bilirubin, Direct: 0.11 mg/dL (ref 0.00–0.40)
Total Protein: 6.6 g/dL (ref 6.0–8.5)

## 2018-04-17 ENCOUNTER — Telehealth: Payer: Self-pay

## 2018-04-17 NOTE — Telephone Encounter (Signed)
-----   Message from Lucilla Lame, MD sent at 04/17/2018 11:23 AM EST ----- Let the patient know that his liver enzymes have come back to normal.

## 2018-04-17 NOTE — Telephone Encounter (Signed)
Pt notified of lab result. 

## 2018-04-24 ENCOUNTER — Other Ambulatory Visit: Payer: Self-pay

## 2018-04-24 MED ORDER — PANTOPRAZOLE SODIUM 40 MG PO TBEC: 40.0000 mg | DELAYED_RELEASE_TABLET | Freq: Every day | ORAL | 3 refills | Status: DC

## 2018-04-28 ENCOUNTER — Other Ambulatory Visit: Payer: Self-pay | Admitting: Medical

## 2018-04-28 ENCOUNTER — Telehealth: Payer: Self-pay | Admitting: Medical

## 2018-04-28 MED ORDER — CIALIS 5 MG PO TABS: 5.0000 mg | ORAL_TABLET | Freq: Every day | ORAL | 1 refills | Status: DC

## 2018-04-28 NOTE — Telephone Encounter (Signed)
Pt called and needs 90 day supply of Cialis sent to [facility]. He said he would be willing to take the generic version if needed.

## 2018-05-10 ENCOUNTER — Other Ambulatory Visit: Payer: Self-pay | Admitting: Medical

## 2018-05-10 ENCOUNTER — Telehealth: Payer: Self-pay | Admitting: Medical

## 2018-05-10 MED ORDER — CIALIS 5 MG PO TABS: 5.0000 mg | ORAL_TABLET | Freq: Every day | ORAL | 0 refills | Status: DC

## 2018-05-10 NOTE — Telephone Encounter (Signed)
Pt called and needs refill on Cialis. Sent to [facility] in [place]. Pt stated that he is completely out and it will take [facility] 10 days to ship the last order

## 2018-05-10 NOTE — Telephone Encounter (Signed)
His insurance will only cover the generic for [facility]

## 2018-05-18 ENCOUNTER — Telehealth: Payer: Self-pay | Admitting: Medical

## 2018-05-18 NOTE — Telephone Encounter (Signed)
Pharmacy is requesting a RX of Tadalafil of 5mg   Be sent to the [facility], [place]

## 2018-05-19 ENCOUNTER — Other Ambulatory Visit: Payer: Self-pay | Admitting: Medical

## 2018-05-19 MED ORDER — CIALIS 5 MG PO TABS: 5.0000 mg | ORAL_TABLET | Freq: Every day | ORAL | 0 refills | Status: DC

## 2018-05-19 NOTE — Telephone Encounter (Signed)
done

## 2018-07-10 ENCOUNTER — Telehealth: Payer: Self-pay | Admitting: Medical

## 2018-07-10 ENCOUNTER — Other Ambulatory Visit: Payer: Self-pay | Admitting: Medical

## 2018-07-10 MED ORDER — CIALIS 5 MG PO TABS: 5.0000 mg | ORAL_TABLET | Freq: Every day | ORAL | 0 refills | Status: DC

## 2018-07-10 NOTE — Telephone Encounter (Signed)
Pharmacy sent in refill request for Cialis 5 mg tab take 1 tablet by mouth daily please send in to [place], [place]

## 2018-07-10 NOTE — Telephone Encounter (Signed)
I received refill request on Cialis which is [facility].  Please schedule for virtual visit/med check as his labs showed diabetes last visit.

## 2018-07-10 NOTE — Telephone Encounter (Signed)
Left message for pt

## 2018-07-28 ENCOUNTER — Other Ambulatory Visit: Payer: Self-pay | Admitting: Medical

## 2018-07-28 ENCOUNTER — Telehealth: Payer: Self-pay

## 2018-07-28 MED ORDER — TADALAFIL 5 MG PO TABS: 5.0000 mg | ORAL_TABLET | Freq: Every day | ORAL | 0 refills | Status: DC

## 2018-07-28 NOTE — Telephone Encounter (Signed)
Please send in tadafil to [facility].  Patient has an appointment for 08-11-18.   Needs to be the generic name, Dr Redmond School sent in cialis on 07-10-18 but it need to be Tadafil please change and send today.

## 2018-08-11 ENCOUNTER — Other Ambulatory Visit: Payer: Self-pay | Admitting: Medical

## 2018-08-11 ENCOUNTER — Other Ambulatory Visit: Payer: Self-pay

## 2018-08-11 ENCOUNTER — Ambulatory Visit (INDEPENDENT_AMBULATORY_CARE_PROVIDER_SITE_OTHER): Payer: BC Managed Care – PPO | Admitting: Medical

## 2018-08-11 ENCOUNTER — Encounter: Payer: Self-pay | Admitting: Medical

## 2018-08-11 VITALS — BP 138/88 | HR 58 | Temp 98.1°F | Ht 70.0 in | Wt 222.6 lb

## 2018-08-11 DIAGNOSIS — E039 Hypothyroidism, unspecified: Secondary | ICD-10-CM

## 2018-08-11 DIAGNOSIS — R5383 Other fatigue: Secondary | ICD-10-CM

## 2018-08-11 DIAGNOSIS — I10 Essential (primary) hypertension: Secondary | ICD-10-CM | POA: Diagnosis not present

## 2018-08-11 DIAGNOSIS — E785 Hyperlipidemia, unspecified: Secondary | ICD-10-CM

## 2018-08-11 DIAGNOSIS — R7301 Impaired fasting glucose: Secondary | ICD-10-CM

## 2018-08-11 NOTE — Progress Notes (Signed)
Subjective: Chief Complaint  Patient presents with  . Diabetes  . Allergies    discuss blood in mucus when blowing nose    Med check today.  He in general for the most part is felt fine since last visit.  Compliant with medications except he has occasionally doubled up on his thyroid dose when he felt better felt fatigued.  He did this without calling us.  He is still in general taking the 150 mcg thyroid dose, generic since November when we lowered his dose  His blood sugars were elevated back in November.  He is not checking blood sugars.  He has been trying to eat healthier.  Not getting a lot of exercise only about 45 minutes of walking per week  Last visit we advised he begin [place] given blood sugars and heart benefit but he declined this due to concerns about side effects on the medication label  He is compliant with cholesterol medicine since last visit.  He came in this morning for fasting labs  He does note occasional fatigue.  Denies snoring, denies witnessed apnea, denies daytime somnolence, sleeps fine in general.  No prior history of low testosterone.  He works 8 to 10 hours/day.  He has twin 85-1/2-year-old children that keep him and his wife busy  He notes dry nostril, occasional bloody mucus that he blows out, wonders about sinus congestion or just dry mucous membranes.  He does take allergy medicine daily but it dries him out.  No other respiratory symptoms.  No fever.  Past Medical History:  Diagnosis Date  . Acid reflux   . Allergic asthma    rare flare up, worse with alllergies as of 12/2016  . Coronary artery disease   . DVT (deep venous thrombosis) ([place]) 01/2001   after CABG  . Dyslipidemia   . Hyperlipidemia   . Hypertension   . Hypothyroidism   . Impaired fasting blood sugar   . MI (myocardial infarction) ([place]) july of 2000   stent  . Palpitations    hx of  . Thyroid disease    hypothyroidism  . Tobacco use    vape, no cigarrette since 04/2015    Current Outpatient Medications on File Prior to Visit  Medication Sig Dispense Refill  . aspirin EC 81 MG tablet Take 1 tablet (81 mg total) by mouth daily. 90 tablet 3  . atorvastatin (LIPITOR) 20 MG tablet Take 1 tablet (20 mg total) by mouth daily. 90 tablet 3  . cetirizine (ZYRTEC) 10 MG tablet Take 10 mg by mouth daily as needed for allergies.    . CIALIS 5 MG tablet Take 1 tablet (5 mg total) by mouth daily. 90 tablet 0  . dicyclomine (BENTYL) 20 MG tablet Take 1 tablet (20 mg total) by mouth 3 (three) times daily as needed for spasms. 30 tablet 0  . escitalopram (LEXAPRO) 10 MG tablet Take 1 tablet (10 mg total) by mouth daily. 90 tablet 3  . fenofibrate (TRICOR) 145 MG tablet TAKE ONE (1) TABLET EACH DAY 90 tablet 3  . irbesartan (AVAPRO) 150 MG tablet Take 1 tablet (150 mg total) by mouth daily. 90 tablet 3  . levothyroxine (SYNTHROID, LEVOTHROID) 150 MCG tablet TAKE 1 TABLET BY MOUTH  DAILY (Patient taking differently: Take 150 mcg by mouth daily before breakfast. ) 90 tablet 3  . loperamide (IMODIUM A-D) 2 MG tablet Take 2 tablets (4 mg total) by mouth 4 (four) times daily as needed for diarrhea or loose stools.  30 tablet 0  . naproxen (NAPROSYN) 500 MG tablet Take 1 tablet (500 mg total) by mouth 2 (two) times daily with a meal. 20 tablet 0  . ondansetron (ZOFRAN ODT) 8 MG disintegrating tablet Take 1 tablet (8 mg total) by mouth every 8 (eight) hours as needed for nausea or vomiting. 20 tablet 0  . ondansetron (ZOFRAN) 4 MG tablet Take 1 tablet (4 mg total) by mouth every 8 (eight) hours as needed for nausea or vomiting. 20 tablet 0  . pantoprazole (PROTONIX) 40 MG tablet Take 1 tablet (40 mg total) by mouth daily. 90 tablet 3  . PROAIR RESPICLICK 433 (90 Base) MCG/ACT AEPB USE 2 INHALATIONS BY MOUTH  EVERY 6 HOURS AS NEEDED 1 each 0  . tadalafil (CIALIS) 5 MG tablet Take 1 tablet (5 mg total) by mouth daily. 90 tablet 0  . [DISCONTINUED] losartan (COZAAR) 100 MG tablet Take 1  tablet (100 mg total) by mouth daily. 90 tablet 3   No current facility-administered medications on file prior to visit.    ROS as in subjective   Objective: BP 138/88   Pulse (!) 58   Temp 98.1 F (36.7 C) (Oral)   Ht 5\' 10"  (1.778 m)   Wt 222 lb 9.6 oz (101 kg)   SpO2 98%   BMI 31.94 kg/m   General appearence: alert, no distress, WD/WN,  HEENT: normocephalic, sclerae anicteric, TMs pearly, nares patent, no discharge or erythema, pharynx normal Oral cavity: MMM, no lesions Neck: supple, no lymphadenopathy, no thyromegaly, no masses Heart: RRR, normal S1, S2, no murmurs Lungs: CTA bilaterally, no wheezes, rhonchi, or rales Abdomen: +bs, soft, non tender, non distended, no masses, no hepatomegaly, no splenomegaly Pulses: 2+ symmetric, upper and lower extremities, normal cap refill Ext: no edema   Assessment: Encounter Diagnoses  Name Primary?  . Dyslipidemia   . Essential hypertension Yes  . Impaired fasting blood sugar   . Fatigue, unspecified type   . Hypothyroidism (acquired)     Plan: Dyslipidemia- compliant with statin, labs today, and if not at goal may need to modify dosing  Hypertension-continue same medication  Impaired glucose- labs today, hemoglobin A1c was elevated last visit  Fatigue-discussed possible causes.  We discussed other testing.  Repeat thyroid labs today, testosterone lab today.  We discussed sleep apnea but he does not have a lot of symptoms suggestive of sleep apnea.  He had a sleep study many years ago that was normal  Hypothyroidism-advise he not ever double up on his medication, always call if there is a concern.  Labs today.   Todd Kelley was seen today for diabetes and allergies.  Diagnoses and all orders for this visit:  Essential hypertension -     Hemoglobin A1c -     Comprehensive metabolic panel  Dyslipidemia -     Hemoglobin A1c -     Comprehensive metabolic panel -     Lipid panel  Impaired fasting blood sugar -      Hemoglobin A1c -     Comprehensive metabolic panel  Fatigue, unspecified type -     Testosterone -     TSH -     Lipid panel  Hypothyroidism (acquired)

## 2018-08-12 LAB — COMPREHENSIVE METABOLIC PANEL
ALT: 25 IU/L (ref 0–44)
AST: 14 IU/L (ref 0–40)
Albumin/Globulin Ratio: 2.1 (ref 1.2–2.2)
Albumin: 4.5 g/dL (ref 3.8–4.9)
Alkaline Phosphatase: 46 IU/L (ref 39–117)
BUN/Creatinine Ratio: 16 (ref 9–20)
BUN: 18 mg/dL (ref 6–24)
Bilirubin Total: 0.5 mg/dL (ref 0.0–1.2)
CO2: 23 mmol/L (ref 20–29)
Calcium: 10 mg/dL (ref 8.7–10.2)
Chloride: 105 mmol/L (ref 96–106)
Creatinine, Ser: 1.12 mg/dL (ref 0.76–1.27)
GFR calc Af Amer: 86 mL/min/{1.73_m2} (ref >59)
GFR calc non Af Amer: 74 mL/min/{1.73_m2} (ref >59)
Globulin, Total: 2.1 g/dL (ref 1.5–4.5)
Glucose: 133 mg/dL — ABNORMAL HIGH (ref 65–99)
Potassium: 4.5 mmol/L (ref 3.5–5.2)
Sodium: 143 mmol/L (ref 134–144)
Total Protein: 6.6 g/dL (ref 6.0–8.5)

## 2018-08-12 LAB — HEMOGLOBIN A1C
Est. average glucose Bld gHb Est-mCnc: 143 mg/dL
Hgb A1c MFr Bld: 6.6 % — ABNORMAL HIGH (ref 4.8–5.6)

## 2018-08-13 LAB — LIPID PANEL
Chol/HDL Ratio: 3.8 ratio (ref 0.0–5.0)
Cholesterol, Total: 139 mg/dL (ref 100–199)
HDL: 37 mg/dL — ABNORMAL LOW (ref >39)
LDL Calculated: 82 mg/dL (ref 0–99)
Triglycerides: 101 mg/dL (ref 0–149)
VLDL Cholesterol Cal: 20 mg/dL (ref 5–40)

## 2018-08-13 LAB — TESTOSTERONE: Testosterone: 527 ng/dL (ref 264–916)

## 2018-08-13 LAB — TSH: TSH: 0.018 u[IU]/mL — ABNORMAL LOW (ref 0.450–4.500)

## 2018-08-16 ENCOUNTER — Other Ambulatory Visit: Payer: Self-pay | Admitting: Medical

## 2018-08-16 MED ORDER — LEVOTHYROXINE SODIUM 137 MCG PO TABS: 137.0000 ug | ORAL_TABLET | Freq: Every day | ORAL | 3 refills | Status: DC

## 2018-08-16 MED ORDER — ROSUVASTATIN CALCIUM 20 MG PO TABS: 20.0000 mg | ORAL_TABLET | Freq: Every day | ORAL | 3 refills | Status: DC

## 2018-09-16 ENCOUNTER — Other Ambulatory Visit: Payer: Self-pay | Admitting: Medical

## 2018-09-18 NOTE — Telephone Encounter (Signed)
[facility] is requesting to fill pt tadalafil. Please advise [facility]

## 2018-09-27 ENCOUNTER — Other Ambulatory Visit: Payer: Self-pay | Admitting: Family Medicine

## 2018-09-27 NOTE — Telephone Encounter (Signed)
Is this ok to refill?  

## 2018-10-27 ENCOUNTER — Other Ambulatory Visit: Payer: Self-pay | Admitting: Medical

## 2018-10-27 ENCOUNTER — Telehealth: Payer: Self-pay | Admitting: Medical

## 2018-10-27 DIAGNOSIS — E039 Hypothyroidism, unspecified: Secondary | ICD-10-CM

## 2018-10-27 NOTE — Telephone Encounter (Signed)
Pt is coming in to repeat thyroid labs on 10/31/18 after increasing meds Can you put in orders, no future orders in system  I asked him about cholesterol but he states he has not been on new meds for 3 months yet

## 2018-10-27 NOTE — Telephone Encounter (Signed)
Lab order in computer

## 2018-10-31 ENCOUNTER — Other Ambulatory Visit: Payer: BC Managed Care – PPO

## 2018-10-31 ENCOUNTER — Other Ambulatory Visit: Payer: Self-pay

## 2018-10-31 DIAGNOSIS — E039 Hypothyroidism, unspecified: Secondary | ICD-10-CM

## 2018-11-01 LAB — T4, FREE: Free T4: 1.51 ng/dL (ref 0.82–1.77)

## 2018-11-01 LAB — TSH: TSH: 1.65 u[IU]/mL (ref 0.450–4.500)

## 2018-11-06 ENCOUNTER — Other Ambulatory Visit: Payer: Self-pay | Admitting: Medical

## 2018-11-08 ENCOUNTER — Telehealth: Payer: Self-pay | Admitting: Internal Medicine

## 2018-11-08 NOTE — Telephone Encounter (Signed)
Please put in future orders for pt, pt has upcoming appt

## 2018-11-08 NOTE — Telephone Encounter (Signed)
Is the upcoming appt physical or med check?  If med check, specific what labs was he planning to have done?

## 2018-11-09 ENCOUNTER — Other Ambulatory Visit: Payer: Self-pay | Admitting: Medical

## 2018-11-09 DIAGNOSIS — E785 Hyperlipidemia, unspecified: Secondary | ICD-10-CM

## 2018-11-09 NOTE — Telephone Encounter (Signed)
Lipid labs are placed

## 2018-11-09 NOTE — Telephone Encounter (Signed)
Lipids? Not sure of dx

## 2018-11-15 ENCOUNTER — Other Ambulatory Visit: Payer: BC Managed Care – PPO

## 2018-11-22 ENCOUNTER — Other Ambulatory Visit: Payer: Self-pay | Admitting: Medical

## 2018-11-24 ENCOUNTER — Other Ambulatory Visit: Payer: Self-pay | Admitting: Medical

## 2018-11-24 DIAGNOSIS — F430 Acute stress reaction: Secondary | ICD-10-CM

## 2018-12-15 ENCOUNTER — Other Ambulatory Visit: Payer: BC Managed Care – PPO

## 2018-12-15 ENCOUNTER — Other Ambulatory Visit: Payer: Self-pay

## 2018-12-15 DIAGNOSIS — E785 Hyperlipidemia, unspecified: Secondary | ICD-10-CM | POA: Diagnosis not present

## 2018-12-16 LAB — LIPID PANEL
Chol/HDL Ratio: 4.4 ratio (ref 0.0–5.0)
Cholesterol, Total: 162 mg/dL (ref 100–199)
HDL: 37 mg/dL — ABNORMAL LOW (ref >39)
LDL Chol Calc (NIH): 96 mg/dL (ref 0–99)
Triglycerides: 163 mg/dL — ABNORMAL HIGH (ref 0–149)
VLDL Cholesterol Cal: 29 mg/dL (ref 5–40)

## 2019-01-19 ENCOUNTER — Encounter: Payer: BLUE CROSS/BLUE SHIELD | Admitting: Medical

## 2019-01-30 ENCOUNTER — Other Ambulatory Visit: Payer: Self-pay | Admitting: Medical

## 2019-02-05 ENCOUNTER — Other Ambulatory Visit: Payer: Self-pay | Admitting: Medical

## 2019-02-06 ENCOUNTER — Other Ambulatory Visit: Payer: Self-pay | Admitting: Medical

## 2019-02-06 ENCOUNTER — Telehealth: Payer: Self-pay

## 2019-02-06 MED ORDER — TADALAFIL 5 MG PO TABS: 5.0000 mg | ORAL_TABLET | Freq: Every day | ORAL | 0 refills | Status: DC

## 2019-02-06 NOTE — Telephone Encounter (Signed)
done

## 2019-02-06 NOTE — Telephone Encounter (Signed)
Received fax from [place] stating that pt. Needs a refill on Cialis #90 pt. Last apt. 08/11/18.

## 2019-02-13 ENCOUNTER — Other Ambulatory Visit: Payer: Self-pay | Admitting: Medical

## 2019-02-13 DIAGNOSIS — F430 Acute stress reaction: Secondary | ICD-10-CM

## 2019-02-26 ENCOUNTER — Other Ambulatory Visit: Payer: Self-pay | Admitting: Gastroenterology

## 2019-03-09 ENCOUNTER — Ambulatory Visit (INDEPENDENT_AMBULATORY_CARE_PROVIDER_SITE_OTHER): Payer: BC Managed Care – PPO | Admitting: Medical

## 2019-03-09 ENCOUNTER — Encounter: Payer: Self-pay | Admitting: Medical

## 2019-03-09 ENCOUNTER — Other Ambulatory Visit: Payer: Self-pay | Admitting: Medical

## 2019-03-09 ENCOUNTER — Other Ambulatory Visit: Payer: Self-pay

## 2019-03-09 ENCOUNTER — Ambulatory Visit (HOSPITAL_COMMUNITY)
Admission: RE | Admit: 2019-03-09 | Discharge: 2019-03-09 | Disposition: A | Discharge status: Home / Self Care | Payer: BC Managed Care – PPO | Source: Ambulatory Visit | Attending: Medical | Admitting: Medical

## 2019-03-09 VITALS — BP 146/88 | HR 58 | Temp 97.7°F | Ht 70.0 in | Wt 226.2 lb

## 2019-03-09 DIAGNOSIS — I10 Essential (primary) hypertension: Secondary | ICD-10-CM

## 2019-03-09 DIAGNOSIS — K219 Gastro-esophageal reflux disease without esophagitis: Secondary | ICD-10-CM

## 2019-03-09 DIAGNOSIS — R5383 Other fatigue: Secondary | ICD-10-CM

## 2019-03-09 DIAGNOSIS — N5082 Scrotal pain: Secondary | ICD-10-CM | POA: Diagnosis not present

## 2019-03-09 DIAGNOSIS — Z125 Encounter for screening for malignant neoplasm of prostate: Secondary | ICD-10-CM

## 2019-03-09 DIAGNOSIS — Z7185 Encounter for immunization safety counseling: Secondary | ICD-10-CM

## 2019-03-09 DIAGNOSIS — N5089 Other specified disorders of the male genital organs: Secondary | ICD-10-CM | POA: Insufficient documentation

## 2019-03-09 DIAGNOSIS — E039 Hypothyroidism, unspecified: Secondary | ICD-10-CM

## 2019-03-09 DIAGNOSIS — I252 Old myocardial infarction: Secondary | ICD-10-CM

## 2019-03-09 DIAGNOSIS — Z951 Presence of aortocoronary bypass graft: Secondary | ICD-10-CM

## 2019-03-09 DIAGNOSIS — N400 Enlarged prostate without lower urinary tract symptoms: Secondary | ICD-10-CM

## 2019-03-09 DIAGNOSIS — I2510 Atherosclerotic heart disease of native coronary artery without angina pectoris: Secondary | ICD-10-CM | POA: Diagnosis not present

## 2019-03-09 DIAGNOSIS — Z0000 Encounter for general adult medical examination without abnormal findings: Secondary | ICD-10-CM

## 2019-03-09 DIAGNOSIS — Z23 Encounter for immunization: Secondary | ICD-10-CM

## 2019-03-09 DIAGNOSIS — G4700 Insomnia, unspecified: Secondary | ICD-10-CM

## 2019-03-09 DIAGNOSIS — J4520 Mild intermittent asthma, uncomplicated: Secondary | ICD-10-CM

## 2019-03-09 DIAGNOSIS — M609 Myositis, unspecified: Secondary | ICD-10-CM

## 2019-03-09 DIAGNOSIS — R7301 Impaired fasting glucose: Secondary | ICD-10-CM | POA: Diagnosis not present

## 2019-03-09 DIAGNOSIS — J309 Allergic rhinitis, unspecified: Secondary | ICD-10-CM

## 2019-03-09 DIAGNOSIS — N529 Male erectile dysfunction, unspecified: Secondary | ICD-10-CM

## 2019-03-09 DIAGNOSIS — D123 Benign neoplasm of transverse colon: Secondary | ICD-10-CM

## 2019-03-09 DIAGNOSIS — Z7189 Other specified counseling: Secondary | ICD-10-CM

## 2019-03-09 DIAGNOSIS — N433 Hydrocele, unspecified: Secondary | ICD-10-CM | POA: Diagnosis not present

## 2019-03-09 DIAGNOSIS — J45909 Unspecified asthma, uncomplicated: Secondary | ICD-10-CM | POA: Insufficient documentation

## 2019-03-09 DIAGNOSIS — T466X5A Adverse effect of antihyperlipidemic and antiarteriosclerotic drugs, initial encounter: Secondary | ICD-10-CM

## 2019-03-09 DIAGNOSIS — E785 Hyperlipidemia, unspecified: Secondary | ICD-10-CM

## 2019-03-09 NOTE — Progress Notes (Signed)
Subjective:   HPI  Todd Kelley is a 54 y.o. male who presents for physical Chief Complaint  Patient presents with  . Annual Exam    with fasting labs     Medical care team includes: Apolonio Cutting, Camelia Eng, PA-C  here for primary care Dentist Eye doctor Dr. Peter Martinique, cardiology   Concerns: He has been dealing with some stress.  His father and brother are both in the hospital.  His father has been dealing with bronchitis and arrhythmia.  They just pulled 14 L of fluid off his father.  His father is having arrhythmia issues.  His brother is on hospice care dealing with lung and brain cancer having seizures.  He notes about 3-day history of right testicular mild pain and swelling, he notes that he has been doing some work on his house with some remodeling at his kitchen.  He also notes the he had pinched his self in his testicles getting in and out of his chair.  He took some ibuprofen which helped.  However he continues to have some discomfort and swelling in the right testicle  He is remodeling his kitchen his kitchen is down the studs, so he has not been eating as healthy as he would like.  Otherwise been in usual state of health.  Compliant with medications.  We stopped TriCor back a few months ago since he was eating healthier and triglycerides were improved, he wanted to do a trial off.  He is compliant with Crestor daily  He notes home blood pressure readings are good  Reviewed their medical, surgical, family, social, medication, and allergy history and updated chart as appropriate.  Past Medical History:  Diagnosis Date  . Acid reflux   . Allergic asthma    rare flare up, worse with alllergies as of 12/2016  . Coronary artery disease   . DVT (deep venous thrombosis) ([place]) 01/2001   after CABG  . Dyslipidemia   . Hyperlipidemia   . Hypertension   . Hypothyroidism   . Impaired fasting blood sugar   . MI (myocardial infarction) ([place]) july of 2000   stent  .  Palpitations    hx of  . Thyroid disease    hypothyroidism  . Tobacco use    vape, no cigarrette since 04/2015    Past Surgical History:  Procedure Laterality Date  . APPENDECTOMY  2001  . CARDIAC CATHETERIZATION  2000   stent  . COLONOSCOPY WITH PROPOFOL N/A 03/17/2018   Procedure: COLONOSCOPY WITH PROPOFOL;  Surgeon: Lucilla Lame, MD;  Location: [facility];  Service: Endoscopy;  Laterality: N/A;  . CORONARY ANGIOPLASTY  jan. 2001  . CORONARY ANGIOPLASTY WITH STENT PLACEMENT  june 2001   stent  . CORONARY ARTERY BYPASS GRAFT  dec. 2002   LIMA graft to LAD,RIMA graft to the intermediate branch and saphenous vein graft  to the posterior lateral and posterior descending branches of the right coronary  . POLYPECTOMY  03/17/2018   Procedure: POLYPECTOMY;  Surgeon: Lucilla Lame, MD;  Location: [facility];  Service: Endoscopy;;    Social History   Socioeconomic History  . Marital status: Married    Spouse name: Not on file  . Number of children: 3  . Years of education: Not on file  . Highest education level: Not on file  Occupational History  . 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 Smoking status: Former Smoker    Packs/day: 0.50    Years: 10.00  Pack years: 5.00    Types: Cigarettes    Quit date: 04/08/2015    Years since quitting: 3.9  . Smokeless tobacco: Never Used  Substance and Sexual Activity  . Alcohol use: Yes    Alcohol/week: 5.0 standard drinks    Types: 5 Cans of beer per week    Comment: occ.  . Drug use: No  . Sexual activity: Not on file  Other Topics Concern  . Not on file  Social History Narrative   Lives with wife, 3 dogs, 3 children, and 2 twins .  Exercise - walking , hunting.   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   As of 03/2019   Social Determinants of Health   Financial Resource Strain:   . Difficulty of Paying Living Expenses: Not on file  Food Insecurity:   . Worried About Charity fundraiser in  the Last Year: Not on file  . Ran Out of Food in the Last Year: Not on file  Transportation Needs:   . Lack of Transportation (Medical): Not on file  . Lack of Transportation (Non-Medical): Not on file  Physical Activity:   . Days of Exercise per Week: Not on file  . Minutes of Exercise per Session: Not on file  Stress:   . Feeling of Stress : Not on file  Social Connections:   . Frequency of Communication with Friends and Family: Not on file  . Frequency of Social Gatherings with Friends and Family: Not on file  . Attends Religious Services: Not on file  . Active Member of Clubs or Organizations: Not on file  . Attends Archivist Meetings: Not on file  . Marital Status: Not on file  Intimate Partner Violence:   . Fear of Current or Ex-Partner: Not on file  . Emotionally Abused: Not on file  . Physically Abused: Not on file  . Sexually Abused: Not on file    Family History  Problem Relation Age of Onset  . Liver cancer Mother   . Cancer Mother   . Heart disease Father        cabg , arrhythmia  . Hypertension Father   . Valvular heart disease Father   . Heart disease Sister        arrhythmia  . Thyroid disease Brother   . Diabetes Paternal Grandmother   . Cancer Paternal Grandfather        leukemia  . Cancer Brother 42       lung and brain, seizures  . Liver disease Brother   . Asthma Brother   . Suicidality Brother   . Multiple sclerosis Sister   . Prostate cancer Neg Hx   . Kidney cancer Neg Hx      Current Outpatient Medications:  .  ASPIRIN LOW DOSE 81 MG EC tablet, TAKE 1 TABLET BY MOUTH  DAILY, Disp: 90 tablet, Rfl: 3 .  escitalopram (LEXAPRO) 10 MG tablet, TAKE 1 TABLET BY MOUTH  DAILY, Disp: 90 tablet, Rfl: 0 .  irbesartan (AVAPRO) 150 MG tablet, TAKE 1 TABLET BY MOUTH  DAILY, Disp: 90 tablet, Rfl: 0 .  levothyroxine (SYNTHROID) 137 MCG tablet, Take 1 tablet (137 mcg total) by mouth daily before breakfast., Disp: 90 tablet, Rfl: 3 .  pantoprazole  (PROTONIX) 40 MG tablet, TAKE 1 TABLET BY MOUTH  DAILY, Disp: 90 tablet, Rfl: 3 .  PROAIR RESPICLICK 123XX123 (90 Base) MCG/ACT AEPB, USE 2 INHALATIONS BY MOUTH  EVERY 6 HOURS AS NEEDED, Disp:  1 each, Rfl: 0 .  rosuvastatin (CRESTOR) 20 MG tablet, Take 1 tablet (20 mg total) by mouth at bedtime. May use 1/2 tablet if needed, Disp: 90 tablet, Rfl: 3 .  tadalafil (CIALIS) 5 MG tablet, Take 1 tablet (5 mg total) by mouth daily., Disp: 90 tablet, Rfl: 0 .  fenofibrate (TRICOR) 145 MG tablet, TAKE 1 TABLET BY MOUTH EACH DAY (Patient not taking: Reported on 03/09/2019), Disp: 90 tablet, Rfl: 3  Allergies  Allergen Reactions  . Statins     Myalgias other than low dose  . Tetracycline     REACTION: Nervous,shakey    Review of Systems Constitutional: -fever, -chills, -sweats, -unexpected weight change, -decreased appetite, -fatigue Allergy: -sneezing, -itching, -congestion Dermatology: -changing moles, --rash, -lumps ENT: -runny nose, -ear pain, -sore throat, -hoarseness, -sinus pain, -teeth pain, - ringing in ears, -hearing loss, -nosebleeds Cardiology: -chest pain, -palpitations, -swelling, -difficulty breathing when lying flat, -waking up short of breath Respiratory: -cough, -shortness of breath, -difficulty breathing with exercise or exertion, -wheezing, -coughing up blood Gastroenterology: -abdominal pain, -nausea, -vomiting, -diarrhea, -constipation, -blood in stool, -changes in bowel movement, -difficulty swallowing or eating Hematology: -bleeding, -bruising  Musculoskeletal: -joint aches, -muscle aches, -joint swelling, -back pain, -neck pain, -cramping, -changes in gait Ophthalmology: denies vision changes, eye redness, itching, discharge Urology: -burning with urination, -difficulty urinating, -blood in urine, -urinary frequency, -urgency, -incontinence Neurology: -headache, -weakness, -tingling, -numbness, -memory loss, -falls, -dizziness Psychology: -depressed mood, -agitation, -sleep  problems     Objective:   BP (!) 146/88   Pulse (!) 58   Temp 97.7 F (36.5 C)   Ht 5\' 10"  (1.778 m)   Wt 226 lb 3.2 oz (102.6 kg)   SpO2 97%   BMI 32.46 kg/m   BP Readings from Last 3 Encounters:  03/09/19 (!) 146/88  08/11/18 138/88  04/12/18 (!) 143/81   Wt Readings from Last 3 Encounters:  03/09/19 226 lb 3.2 oz (102.6 kg)  08/11/18 222 lb 9.6 oz (101 kg)  04/12/18 220 lb (99.8 kg)     General appearance: alert, no distress, WD/WN, white male Skin: No worrisome lesions, scattered macules HEENT: normocephalic, sclerae anicteric, PERRLA, EOMi, nares patent, no discharge or erythema, pharynx normal Oral cavity: MMM, no lesions Neck: supple, no lymphadenopathy, no thyromegaly, no masses, no bruits Heart: RRR, normal S1, S2, no murmurs Chest with central surgical scar from CABG Lungs: CTA bilaterally, no wheezes, rhonchi, or rales Abdomen: +bs, soft, right mid abdomen's small round scar, non tender, non distended, no masses, no hepatomegaly, no splenomegaly Back: non tender Musculoskeletal: Right medial lower leg with distal bypass graft scar from prior CABG surgery, nontender, no swelling, no obvious deformity Extremities: no edema, no cyanosis, no clubbing Pulses: 2+ symmetric, upper and lower extremities, normal cap refill Neurological: alert, oriented x 3, CN2-12 intact, strength normal upper extremities and lower extremities, sensation normal throughout, DTRs 2+ throughout, no cerebellar signs, gait normal Psychiatric: normal affect, behavior normal, pleasant  GU: Normal male, circumcised, he is tender over the right scrotum and testicle which seems to be somewhat swollen, otherwise no mass no hernia no lymphadenopathy Rectal declined    Assessment and Plan :    Encounter Diagnoses  Name Primary?  . Routine general medical examination at a health care facility Yes  . Hypothyroidism (acquired)   . Coronary artery disease involving native coronary artery of  native heart without angina pectoris   . Essential hypertension   . Gastroesophageal reflux disease without esophagitis   .  Benign neoplasm of transverse colon   . Allergic rhinitis, unspecified seasonality, unspecified trigger   . Impaired fasting blood sugar   . Statin-induced myositis   . Benign prostatic hyperplasia, unspecified whether lower urinary tract symptoms present   . Dyslipidemia   . S/P CABG (coronary artery bypass graft)   . History of MI (myocardial infarction)   . Erectile dysfunction, unspecified erectile dysfunction type   . Screening for prostate cancer   . Vaccine counseling   . Need for influenza vaccination   . Insomnia, unspecified type   . Scrotal pain   . Scrotal swelling   . Mild intermittent extrinsic asthma without complication     Physical exam - discussed and counseled on healthy lifestyle, diet, exercise, preventative care, vaccinations, sick and well care, proper use of emergency dept and after hours care, and addressed their concerns.    Health screening: See your eye doctor yearly for routine vision care. See your dentist yearly for routine dental care including hygiene visits twice yearly.  He has not seen cardiology since 2017 despite prior recommendations the last few years.  I advised we give him referral now.  He declines and says he wants to go in about 3 months.  His blood pressure is not at goal today.  He declines adding on a blood pressure medication today.  He assures me he is going to try to get in with Dr. Martinique in the next 3 months.  Cancer screening I reviewed his colonoscopy from January 2020.  He notes repeat in 5 years recommended Discussed PSA, prostate exam, and prostate cancer screening risks/benefits.   Discussed prostate symptoms as well.  Prostate screening performed: Yes  Vaccinations: He is up-to-date on TD vaccine  Counseled on the influenza virus vaccine.  Vaccine information sheet given.  Influenza vaccine given  after consent obtained.  He will check insurance coverage for shingles vaccine.  He declines Covid vaccine at this time wants to wait it out a little bit longer since this is a new vaccine.  He declines pneumococcal vaccine at this time.    Continue routine medications, counseled on secondary prevention of heart disease  Other significant issues discussed   Right scrotal swelling and tenderness-we will send for stat scrotal ultrasound today to rule out torsion  I expressed my sympathy for his situation with his brother in hospice  seasonal allergies, allergic asthma -  Continue albuterol as needed, continue antihistamine during spring and fall, discussed reasons for follow-up  Coronary artery disease, history of CABG and MI-counseled on diet, secondary prevention, exercise and medication compliance  Continue irbesartan 150 mg daily  Continue aspirin 81 mg daily  I recommend he go ahead and see cardiology sooner than later  He declined me sending the appointment up now and declined me adding antihypertensive now  Hypothyroidism-continue current medication  Continue levothyroxine 137 mcg daily  Hyperlipidemia-continue current medications, labs today  Continue Crestor.  He seems to be tolerating this just fine  Add back TriCor which we stopped 3 months ago to give it a trial without the medication with improved diet but on his recent labs the numbers had worsened  Your height weight ratio your BMI is elevated  I recommend you work on losing some weight.  Calorie restriction, increasing exercise when possible, and decreasing carbs and overall calories  Insomnia, premature ejaculation -he continues to derive benefit on medication Lexapro.  Discussed risk and benefits of medication  Pending labs will need [facility]  Latif was seen today for annual exam.  Diagnoses and all orders for this visit:  Routine general medical examination at a health care  facility -     Comprehensive metabolic panel -     PSA -     Hemoglobin A1c -     CBC with Differential  Hypothyroidism (acquired)  Coronary artery disease involving native coronary artery of native heart without angina pectoris  Essential hypertension  Gastroesophageal reflux disease without esophagitis  Benign neoplasm of transverse colon  Allergic rhinitis, unspecified seasonality, unspecified trigger  Impaired fasting blood sugar -     Hemoglobin A1c  Statin-induced myositis  Benign prostatic hyperplasia, unspecified whether lower urinary tract symptoms present  Dyslipidemia  S/P CABG (coronary artery bypass graft)  History of MI (myocardial infarction)  Erectile dysfunction, unspecified erectile dysfunction type  Screening for prostate cancer -     PSA  Vaccine counseling  Need for influenza vaccination  Insomnia, unspecified type  Scrotal pain -     Cancel: US Scrotum; Future -     Cancel: US SCROTUM W/DOPPLER; Future -     US SCROTUM W/DOPPLER  Scrotal swelling -     Cancel: US Scrotum; Future -     Cancel: US SCROTUM W/DOPPLER; Future -     US SCROTUM W/DOPPLER  Mild intermittent extrinsic asthma without complication  Other orders -     Flu Vaccine QUAD 6+ mos PF IM (Fluarix Quad PF)    Follow-up pending labs, yearly for physical

## 2019-03-10 LAB — COMPREHENSIVE METABOLIC PANEL
ALT: 21 IU/L (ref 0–44)
AST: 17 IU/L (ref 0–40)
Albumin/Globulin Ratio: 2 (ref 1.2–2.2)
Albumin: 4.7 g/dL (ref 3.8–4.9)
Alkaline Phosphatase: 107 IU/L (ref 39–117)
BUN/Creatinine Ratio: 11 (ref 9–20)
BUN: 12 mg/dL (ref 6–24)
Bilirubin Total: 0.5 mg/dL (ref 0.0–1.2)
CO2: 25 mmol/L (ref 20–29)
Calcium: 9.9 mg/dL (ref 8.7–10.2)
Chloride: 102 mmol/L (ref 96–106)
Creatinine, Ser: 1.09 mg/dL (ref 0.76–1.27)
GFR calc Af Amer: 88 mL/min/{1.73_m2} (ref >59)
GFR calc non Af Amer: 76 mL/min/{1.73_m2} (ref >59)
Globulin, Total: 2.4 g/dL (ref 1.5–4.5)
Glucose: 131 mg/dL — ABNORMAL HIGH (ref 65–99)
Potassium: 4.5 mmol/L (ref 3.5–5.2)
Sodium: 142 mmol/L (ref 134–144)
Total Protein: 7.1 g/dL (ref 6.0–8.5)

## 2019-03-10 LAB — CBC WITH DIFFERENTIAL/PLATELET
Basophils Absolute: 0 10*3/uL (ref 0.0–0.2)
Basos: 0 %
EOS (ABSOLUTE): 0.8 10*3/uL — ABNORMAL HIGH (ref 0.0–0.4)
Eos: 10 %
Hematocrit: 47.6 % (ref 37.5–51.0)
Hemoglobin: 15.8 g/dL (ref 13.0–17.7)
Immature Grans (Abs): 0 10*3/uL (ref 0.0–0.1)
Immature Granulocytes: 0 %
Lymphocytes Absolute: 1.7 10*3/uL (ref 0.7–3.1)
Lymphs: 21 %
MCH: 29.6 pg (ref 26.6–33.0)
MCHC: 33.2 g/dL (ref 31.5–35.7)
MCV: 89 fL (ref 79–97)
Monocytes Absolute: 0.5 10*3/uL (ref 0.1–0.9)
Monocytes: 6 %
Neutrophils Absolute: 4.8 10*3/uL (ref 1.4–7.0)
Neutrophils: 63 %
Platelets: 196 10*3/uL (ref 150–450)
RBC: 5.34 x10E6/uL (ref 4.14–5.80)
RDW: 12.8 % (ref 11.6–15.4)
WBC: 7.8 10*3/uL (ref 3.4–10.8)

## 2019-03-10 LAB — PSA: Prostate Specific Ag, Serum: 1.4 ng/mL (ref 0.0–4.0)

## 2019-03-10 LAB — HEMOGLOBIN A1C
Est. average glucose Bld gHb Est-mCnc: 157 mg/dL
Hgb A1c MFr Bld: 7.1 % — ABNORMAL HIGH (ref 4.8–5.6)

## 2019-03-12 ENCOUNTER — Other Ambulatory Visit: Payer: BLUE CROSS/BLUE SHIELD

## 2019-03-13 ENCOUNTER — Encounter: Payer: Self-pay | Admitting: Medical

## 2019-03-13 ENCOUNTER — Other Ambulatory Visit: Payer: Self-pay | Admitting: Medical

## 2019-03-13 MED ORDER — FENOFIBRATE 145 MG PO TABS: ORAL_TABLET | ORAL | 3 refills | Status: DC

## 2019-03-20 ENCOUNTER — Telehealth: Payer: Self-pay

## 2019-03-20 NOTE — Telephone Encounter (Signed)
Pt. Called LM stating he was wanting to know if you had sent in his Wilder Glade and if you had not sent it in yet could you send it in to [facility].

## 2019-03-20 NOTE — Telephone Encounter (Signed)
Per the lab message recently he stated that he was NOT going to use this medication.  His labs are in the diabetic range.  Medication options include low-dose Metformin, glipizide, Farxiga, each of which would be okay.  Metformin and glipizide will certainly be cheaper, but Wilder Glade has the added benefit of heart disease preventative measures per research  So please clarify if he is okay with trying the [place] or not?

## 2019-03-21 NOTE — Telephone Encounter (Signed)
Pt. Stated that he didn't care which one you sent in just wants a 30 day supply sent in to [facility] so he can try it out to see if he does ok on it first before getting a 90 day supply.

## 2019-03-22 ENCOUNTER — Other Ambulatory Visit: Payer: Self-pay | Admitting: Medical

## 2019-03-22 MED ORDER — DAPAGLIFLOZIN PROPANEDIOL 5 MG PO TABS: 5.0000 mg | ORAL_TABLET | Freq: Every day | ORAL | 2 refills | Status: DC

## 2019-03-22 NOTE — Telephone Encounter (Signed)
I sent Todd Kelley to begin daily in the morning with increased water intake.

## 2019-04-06 ENCOUNTER — Other Ambulatory Visit: Payer: Self-pay | Admitting: Medical

## 2019-04-06 DIAGNOSIS — F430 Acute stress reaction: Secondary | ICD-10-CM

## 2019-04-21 ENCOUNTER — Other Ambulatory Visit: Payer: Self-pay | Admitting: Medical

## 2019-06-10 ENCOUNTER — Other Ambulatory Visit: Payer: Self-pay | Admitting: Medical

## 2019-08-09 ENCOUNTER — Other Ambulatory Visit: Payer: Self-pay | Admitting: Medical

## 2019-09-01 ENCOUNTER — Other Ambulatory Visit: Payer: Self-pay | Admitting: Medical

## 2019-11-10 ENCOUNTER — Other Ambulatory Visit: Payer: Self-pay | Admitting: Medical

## 2019-11-13 NOTE — Telephone Encounter (Signed)
Due for med check.  Please schedule fasting med check

## 2019-11-13 NOTE — Telephone Encounter (Signed)
Unable to leave pt a voicemail.

## 2019-12-15 ENCOUNTER — Other Ambulatory Visit: Payer: Self-pay | Admitting: Gastroenterology

## 2019-12-15 ENCOUNTER — Other Ambulatory Visit: Payer: Self-pay | Admitting: Medical

## 2019-12-17 NOTE — Telephone Encounter (Signed)
He is due now for fasting lipid panel and diabetes follow-up.  Please schedule, give 30-day supply if needed

## 2019-12-18 NOTE — Telephone Encounter (Signed)
Left message for patient to call and schedule appointment and let us know if he needs a refill on his medication.

## 2019-12-19 ENCOUNTER — Telehealth: Payer: Self-pay | Admitting: Medical

## 2019-12-19 NOTE — Telephone Encounter (Signed)
With his underlying medical problems, he should be seen at [facility] at least twice yearly if not more often.  Once yearly is not ok.  Please schedule now, fasting  Once scheduled, can send in refill

## 2019-12-19 NOTE — Telephone Encounter (Signed)
Pt returned Genera's call. He already has physical appointment scheduled for 03/14/2020 He does need refills for Tricor and Cialis sent to [facility] stated to patient that they had already sent request

## 2019-12-20 ENCOUNTER — Other Ambulatory Visit: Payer: Self-pay | Admitting: Medical

## 2019-12-20 MED ORDER — FENOFIBRATE 145 MG PO TABS: ORAL_TABLET | ORAL | 0 refills | Status: DC

## 2019-12-20 MED ORDER — TADALAFIL 5 MG PO TABS: 5.0000 mg | ORAL_TABLET | Freq: Every day | ORAL | 0 refills | Status: DC

## 2019-12-20 NOTE — Telephone Encounter (Signed)
Can you call this patient please?

## 2019-12-20 NOTE — Telephone Encounter (Signed)
Called pt and explained Todd Kelley's message & he scheduled Diabetes check for next week.  Please send refills to [facility] for Cialis and Tricor

## 2019-12-27 ENCOUNTER — Encounter: Payer: Self-pay | Admitting: Medical

## 2019-12-27 ENCOUNTER — Ambulatory Visit (INDEPENDENT_AMBULATORY_CARE_PROVIDER_SITE_OTHER): Payer: BC Managed Care – PPO | Admitting: Medical

## 2019-12-27 ENCOUNTER — Other Ambulatory Visit: Payer: Self-pay

## 2019-12-27 VITALS — BP 162/88 | HR 56 | Ht 70.0 in | Wt 222.6 lb

## 2019-12-27 DIAGNOSIS — R7301 Impaired fasting glucose: Secondary | ICD-10-CM

## 2019-12-27 DIAGNOSIS — E785 Hyperlipidemia, unspecified: Secondary | ICD-10-CM

## 2019-12-27 DIAGNOSIS — E118 Type 2 diabetes mellitus with unspecified complications: Secondary | ICD-10-CM | POA: Diagnosis not present

## 2019-12-27 DIAGNOSIS — Z23 Encounter for immunization: Secondary | ICD-10-CM | POA: Diagnosis not present

## 2019-12-27 DIAGNOSIS — I10 Essential (primary) hypertension: Secondary | ICD-10-CM | POA: Diagnosis not present

## 2019-12-27 DIAGNOSIS — I252 Old myocardial infarction: Secondary | ICD-10-CM

## 2019-12-27 DIAGNOSIS — I2510 Atherosclerotic heart disease of native coronary artery without angina pectoris: Secondary | ICD-10-CM | POA: Diagnosis not present

## 2019-12-27 LAB — LIPID PANEL
Chol/HDL Ratio: 3.7 ratio (ref 0.0–5.0)
Cholesterol, Total: 137 mg/dL (ref 100–199)
HDL: 37 mg/dL — ABNORMAL LOW (ref >39)
LDL Chol Calc (NIH): 80 mg/dL (ref 0–99)
Triglycerides: 105 mg/dL (ref 0–149)
VLDL Cholesterol Cal: 20 mg/dL (ref 5–40)

## 2019-12-27 LAB — POCT GLYCOSYLATED HEMOGLOBIN (HGB A1C): Hemoglobin A1C: 7.2 % — AB (ref 4.0–5.6)

## 2019-12-27 LAB — POCT CBG (FASTING - GLUCOSE)-MANUAL ENTRY: Glucose Fasting, POC: 126 mg/dL — AB (ref 70–99)

## 2019-12-27 MED ORDER — TADALAFIL 5 MG PO TABS: 5.0000 mg | ORAL_TABLET | Freq: Every day | ORAL | 0 refills | Status: DC

## 2019-12-27 MED ORDER — ASPIRIN 81 MG PO TBEC: 81.0000 mg | DELAYED_RELEASE_TABLET | Freq: Every day | ORAL | 3 refills | Status: AC

## 2019-12-27 NOTE — Progress Notes (Signed)
Subjective:  Todd Kelley is a 54 y.o. male who presents for Chief Complaint  Patient presents with  . Diabetes    with fasting labs      Here for med check  Primarily here to follow-up on blood sugars.  He has impaired blood sugar/prediabetes.  Last visit we talked about starting [place] given history of heart disease and prediabetes and given the benefit of cardiac protection with [place].  However after trying this for several weeks he felt weak all the time exhausted.  Within 1 week of stopping [place] he felt much better.  So currently he is taking nothing to control blood sugars.  He does not check his blood sugar.  He is trying to eat healthy and exercise regularly.  He is walking at least 30 minutes every day.  He drinks over 100 ounces of water a day.  He did have a recent blood sugar checked and it health screening at work and his blood sugar was 101 fasting.  He is compliant with his blood pressure medicine and cholesterol medicine.  Years past he was on Zetia which did better to control his HDL and cholesterol along with statin at a lower dose.  He cannot recall whether Zetia is no longer on board but he thinks his numbers look better back then  He notes home blood pressure readings are normal.  No other aggravating or relieving factors.    No other c/o.  Past Medical History:  Diagnosis Date  . Acid reflux   . Allergic asthma    rare flare up, worse with alllergies as of 12/2016  . Coronary artery disease   . DVT (deep venous thrombosis) ([place]) 01/2001   after CABG  . Dyslipidemia   . Hyperlipidemia   . Hypertension   . Hypothyroidism   . Impaired fasting blood sugar   . MI (myocardial infarction) ([place]) july of 2000   stent  . Palpitations    hx of  . Thyroid disease    hypothyroidism  . Tobacco use    vape, no cigarrette since 04/2015   Current Outpatient Medications on File Prior to Visit  Medication Sig Dispense Refill  . ASPIRIN LOW DOSE 81 MG EC tablet TAKE  1 TABLET BY MOUTH  DAILY 90 tablet 3  . escitalopram (LEXAPRO) 10 MG tablet TAKE 1 TABLET BY MOUTH  DAILY 90 tablet 3  . fenofibrate (TRICOR) 145 MG tablet TAKE 1 TABLET BY MOUTH EACH DAY 90 tablet 0  . irbesartan (AVAPRO) 150 MG tablet TAKE 1 TABLET BY MOUTH  DAILY 90 tablet 3  . levothyroxine (SYNTHROID) 137 MCG tablet TAKE 1 TABLET BY MOUTH  DAILY BEFORE BREAKFAST 90 tablet 3  . pantoprazole (PROTONIX) 40 MG tablet Take 1 tablet (40 mg total) by mouth daily. **PLEASE SCHEDULE FOLLOW UP APPT** 90 tablet 1  . PROAIR RESPICLICK 295 (90 Base) MCG/ACT AEPB USE 2 INHALATIONS BY MOUTH  EVERY 6 HOURS AS NEEDED 1 each 0  . rosuvastatin (CRESTOR) 20 MG tablet TAKE 1 TABLET BY MOUTH AT  BEDTIME. MAY USE 1/2 TABLET IF NEEDED 90 tablet 3  . tadalafil (CIALIS) 5 MG tablet Take 1 tablet (5 mg total) by mouth daily. 90 tablet 0  . FARXIGA 5 MG TABS tablet TAKE 1 TABLET BY MOUTH ONCE EVERY MORNING BEFORE MEALS 30 tablet 2  . [DISCONTINUED] losartan (COZAAR) 100 MG tablet Take 1 tablet (100 mg total) by mouth daily. 90 tablet 3   No current facility-administered medications on  file prior to visit.     The following portions of the patient's history were reviewed and updated as appropriate: allergies, current medications, past family history, past medical history, past social history, past surgical history and problem list.  ROS Otherwise as in subjective above  Objective: BP (!) 162/88   Pulse (!) 56   Ht 5\' 10"  (1.778 m)   Wt 222 lb 9.6 oz (101 kg)   SpO2 95%   BMI 31.94 kg/m   BP Readings from Last 3 Encounters:  12/27/19 (!) 162/88  03/09/19 (!) 146/88  08/11/18 138/88   Wt Readings from Last 3 Encounters:  12/27/19 222 lb 9.6 oz (101 kg)  03/09/19 226 lb 3.2 oz (102.6 kg)  08/11/18 222 lb 9.6 oz (101 kg)    General appearance: alert, no distress, well developed, well nourished Heart: RRR, normal S1, S2, no murmurs Lungs: CTA bilaterally, no wheezes, rhonchi, or rales Pulses: 2+  radial pulses, 2+ pedal pulses, normal cap refill Ext: no edema    Assessment: Encounter Diagnoses  Name Primary?  . Essential hypertension Yes  . Controlled type 2 diabetes mellitus with complication, without long-term current use of insulin ([place])   . Coronary artery disease involving native coronary artery of native heart without angina pectoris   . Impaired fasting blood sugar   . Dyslipidemia   . History of MI (myocardial infarction)   . Need for shingles vaccine      Plan: Hypertension-he notes home readings are normal.  Probably some element of whitecoat hypertension, continue current medications  Diabetes-over the past year his numbers have ranged from just borderline to at the threshold for making the diagnosis.  Today he is certainly over the threshold.  Discussed diagnosis of diabetes.  Discussed possible complications of diabetes.  Discussed the goals of diabetic care.  Begin Metformin 500 mg daily.  Discussed routine follow-up.  Dyslipidemia-HDL not at goal on prior labs.  Repeat labs today fasting.  He notes in the past Zetia worked better for him in conjunction with statin and current regimen.  He was on Zetia in years past but probably had to stop due to insurance purposes.  We will likely restart this.  Counseled on the Shingrix vaccine.  Vaccine information sheet given. Shingrix #1 vaccine given after consent obtained.   Return in 2 months for Shingrix #2.  Todd Kelley was seen today for diabetes.  Diagnoses and all orders for this visit:  Essential hypertension  Controlled type 2 diabetes mellitus with complication, without long-term current use of insulin (HCC)  Coronary artery disease involving native coronary artery of native heart without angina pectoris  Impaired fasting blood sugar -     Cancel: Hemoglobin A1c -     HgB A1c -     Glucose (CBG), Fasting  Dyslipidemia -     Lipid panel  History of MI (myocardial infarction) -     Lipid panel  Need for  shingles vaccine  Other orders -     Varicella-zoster vaccine IM (Shingrix)    Follow up: 03/2020 for physical

## 2019-12-27 NOTE — Addendum Note (Signed)
Addended by: Carlena Hurl on: 12/27/2019 02:28 PM   Modules accepted: Orders

## 2019-12-28 ENCOUNTER — Other Ambulatory Visit: Payer: Self-pay | Admitting: Medical

## 2019-12-28 MED ORDER — METFORMIN HCL 500 MG PO TABS: 500.0000 mg | ORAL_TABLET | Freq: Every day | ORAL | 1 refills | Status: DC

## 2020-02-25 ENCOUNTER — Other Ambulatory Visit: Payer: Self-pay | Admitting: Medical

## 2020-03-14 ENCOUNTER — Encounter: Payer: Self-pay | Admitting: Medical

## 2020-03-14 ENCOUNTER — Ambulatory Visit (INDEPENDENT_AMBULATORY_CARE_PROVIDER_SITE_OTHER): Payer: No Typology Code available for payment source | Admitting: Medical

## 2020-03-14 ENCOUNTER — Other Ambulatory Visit: Payer: Self-pay

## 2020-03-14 VITALS — BP 138/88 | HR 55 | Ht 70.0 in | Wt 221.0 lb

## 2020-03-14 DIAGNOSIS — I10 Essential (primary) hypertension: Secondary | ICD-10-CM

## 2020-03-14 DIAGNOSIS — Z23 Encounter for immunization: Secondary | ICD-10-CM | POA: Diagnosis not present

## 2020-03-14 DIAGNOSIS — E039 Hypothyroidism, unspecified: Secondary | ICD-10-CM

## 2020-03-14 DIAGNOSIS — M609 Myositis, unspecified: Secondary | ICD-10-CM

## 2020-03-14 DIAGNOSIS — T466X5A Adverse effect of antihyperlipidemic and antiarteriosclerotic drugs, initial encounter: Secondary | ICD-10-CM

## 2020-03-14 DIAGNOSIS — N529 Male erectile dysfunction, unspecified: Secondary | ICD-10-CM

## 2020-03-14 DIAGNOSIS — G4700 Insomnia, unspecified: Secondary | ICD-10-CM

## 2020-03-14 DIAGNOSIS — Z0000 Encounter for general adult medical examination without abnormal findings: Secondary | ICD-10-CM | POA: Diagnosis not present

## 2020-03-14 DIAGNOSIS — I2510 Atherosclerotic heart disease of native coronary artery without angina pectoris: Secondary | ICD-10-CM | POA: Diagnosis not present

## 2020-03-14 DIAGNOSIS — Z7185 Encounter for immunization safety counseling: Secondary | ICD-10-CM

## 2020-03-14 DIAGNOSIS — Z125 Encounter for screening for malignant neoplasm of prostate: Secondary | ICD-10-CM

## 2020-03-14 DIAGNOSIS — K219 Gastro-esophageal reflux disease without esophagitis: Secondary | ICD-10-CM

## 2020-03-14 DIAGNOSIS — E118 Type 2 diabetes mellitus with unspecified complications: Secondary | ICD-10-CM

## 2020-03-14 DIAGNOSIS — I252 Old myocardial infarction: Secondary | ICD-10-CM

## 2020-03-14 DIAGNOSIS — J309 Allergic rhinitis, unspecified: Secondary | ICD-10-CM | POA: Diagnosis not present

## 2020-03-14 DIAGNOSIS — J4520 Mild intermittent asthma, uncomplicated: Secondary | ICD-10-CM

## 2020-03-14 DIAGNOSIS — R7301 Impaired fasting glucose: Secondary | ICD-10-CM

## 2020-03-14 DIAGNOSIS — N400 Enlarged prostate without lower urinary tract symptoms: Secondary | ICD-10-CM

## 2020-03-14 DIAGNOSIS — Z951 Presence of aortocoronary bypass graft: Secondary | ICD-10-CM

## 2020-03-14 DIAGNOSIS — E785 Hyperlipidemia, unspecified: Secondary | ICD-10-CM

## 2020-03-14 LAB — POCT URINALYSIS DIP (PROADVANTAGE DEVICE)
Bilirubin, UA: NEGATIVE
Blood, UA: NEGATIVE
Glucose, UA: NEGATIVE mg/dL
Ketones, POC UA: NEGATIVE mg/dL
Leukocytes, UA: NEGATIVE
Nitrite, UA: NEGATIVE
Protein Ur, POC: NEGATIVE mg/dL
Specific Gravity, Urine: 1.015
Urobilinogen, Ur: 0.2
pH, UA: 6 (ref 5.0–8.0)

## 2020-03-14 NOTE — Progress Notes (Addendum)
Subjective:   HPI  Todd Kelley is a 55 y.o. male who presents for Chief Complaint  Patient presents with  . Annual Exam    Physical with fasting labs. Patient needs all meds sent to updated pharmacy. Wants 2nd shingrix today     Patient Care Team: Lorel Lembo, Leward Quan as PCP - General (Family Medicine) Sees dentist Sees eye doctor Dr. Lucilla Lame, GI  Concerns: Diabetes - not checking sugars.  Compliant with metformin.  He is still trying to get used to this as he gets some gas and bloating with metformin.  No diarrhea.  No polyuria, no polydipsia, vision changes, no foot lesion  HTN - occasional checks BP at home.  BPs remain good unless he gets ticked off.   Usually 135-140/80-95.  Compliant with Irbesartan 150mg  daily.  Has been on lisinopril and losartan in the past.  No prior beta blocker.    History of MI, status post CABG, CAD, dyslipidemia- he is taking aspirin daily, compliant with Crestor and fenofibrate without complaint.  No chest pain, no difficulty breathing  Hypothyroidism-compliant with levothyroxine 137 mcg daily  BPH and ED-doing fine on Cialis daily  Mood doing fine, no particular issues   Reviewed their medical, surgical, family, social, medication, and allergy history and updated chart as appropriate.  Past Medical History:  Diagnosis Date  . Acid reflux   . Allergic asthma    rare flare up, worse with alllergies as of 12/2016  . Coronary artery disease   . DVT (deep venous thrombosis) ([place]) 01/2001   after CABG  . Dyslipidemia   . Hyperlipidemia   . Hypertension   . Hypothyroidism   . Impaired fasting blood sugar   . MI (myocardial infarction) ([place]) july of 2000   stent  . Palpitations    hx of  . Thyroid disease    hypothyroidism  . Tobacco use    vape, no cigarrette since 04/2015    Past Surgical History:  Procedure Laterality Date  . APPENDECTOMY  2001  . CARDIAC CATHETERIZATION  2000   stent  . COLONOSCOPY WITH PROPOFOL N/A  03/17/2018   Procedure: COLONOSCOPY WITH PROPOFOL;  Surgeon: Lucilla Lame, MD;  Location: [facility];  Service: Endoscopy;  Laterality: N/A;  . CORONARY ANGIOPLASTY  jan. 2001  . CORONARY ANGIOPLASTY WITH STENT PLACEMENT  june 2001   stent  . CORONARY ARTERY BYPASS GRAFT  dec. 2002   LIMA graft to LAD,RIMA graft to the intermediate branch and saphenous vein graft  to the posterior lateral and posterior descending branches of the right coronary  . POLYPECTOMY  03/17/2018   Procedure: POLYPECTOMY;  Surgeon: Lucilla Lame, MD;  Location: [facility];  Service: Endoscopy;;    Family History  Problem Relation Age of Onset  . Liver cancer Mother   . Cancer Mother   . Heart disease Father        cabg , arrhythmia  . Hypertension Father   . Valvular heart disease Father   . Heart disease Sister        arrhythmia  . Thyroid disease Brother   . Diabetes Paternal Grandmother   . Cancer Paternal Grandfather        leukemia  . Cancer Brother 21       lung and brain, seizures  . Liver disease Brother   . Asthma Brother   . Suicidality Brother   . Multiple sclerosis Sister   . Prostate cancer Neg Hx   .  Kidney cancer Neg Hx      Current Outpatient Medications:  .  aspirin (ASPIRIN LOW DOSE) 81 MG EC tablet, Take 1 tablet (81 mg total) by mouth daily. Swallow whole., Disp: 90 tablet, Rfl: 3 .  escitalopram (LEXAPRO) 10 MG tablet, TAKE 1 TABLET BY MOUTH  DAILY, Disp: 90 tablet, Rfl: 3 .  fenofibrate (TRICOR) 145 MG tablet, TAKE 1 TABLET BY MOUTH  DAILY, Disp: 90 tablet, Rfl: 0 .  irbesartan (AVAPRO) 150 MG tablet, TAKE 1 TABLET BY MOUTH  DAILY, Disp: 90 tablet, Rfl: 3 .  levothyroxine (SYNTHROID) 137 MCG tablet, TAKE 1 TABLET BY MOUTH  DAILY BEFORE BREAKFAST, Disp: 90 tablet, Rfl: 3 .  metFORMIN (GLUCOPHAGE) 500 MG tablet, Take 1 tablet (500 mg total) by mouth daily with breakfast., Disp: 90 tablet, Rfl: 1 .  PROAIR RESPICLICK 160 (90 Base) MCG/ACT AEPB, USE 2 INHALATIONS BY  MOUTH  EVERY 6 HOURS AS NEEDED, Disp: 1 each, Rfl: 0 .  rosuvastatin (CRESTOR) 20 MG tablet, TAKE 1 TABLET BY MOUTH AT  BEDTIME. MAY USE 1/2 TABLET IF NEEDED (Patient taking differently: Take by mouth daily.), Disp: 90 tablet, Rfl: 3 .  tadalafil (CIALIS) 5 MG tablet, Take 1 tablet (5 mg total) by mouth daily., Disp: 90 tablet, Rfl: 0 .  pantoprazole (PROTONIX) 40 MG tablet, Take 1 tablet (40 mg total) by mouth daily. **PLEASE SCHEDULE FOLLOW UP APPT** (Patient not taking: Reported on 03/14/2020), Disp: 90 tablet, Rfl: 1  Allergies  Allergen Reactions  . Farxiga [Dapagliflozin]     weak  . Statins     Myalgias other than low dose  . Tetracycline     REACTION: Nervous,shakey     Review of Systems Constitutional: -fever, -chills, -sweats, -unexpected weight change, -decreased appetite, -fatigue Allergy: -sneezing, -itching, -congestion Dermatology: -changing moles, --rash, -lumps ENT: -runny nose, -ear pain, -sore throat, -hoarseness, -sinus pain, -teeth pain, - ringing in ears, -hearing loss, -nosebleeds Cardiology: -chest pain, -palpitations, -swelling, -difficulty breathing when lying flat, -waking up short of breath Respiratory: -cough, -shortness of breath, -difficulty breathing with exercise or exertion, -wheezing, -coughing up blood Gastroenterology: -abdominal pain, -nausea, -vomiting, -diarrhea, -constipation, -blood in stool, -changes in bowel movement, -difficulty swallowing or eating Hematology: -bleeding, -bruising  Musculoskeletal: -joint aches, -muscle aches, -joint swelling, -back pain, -neck pain, -cramping, -changes in gait Ophthalmology: denies vision changes, eye redness, itching, discharge Urology: -burning with urination, -difficulty urinating, -blood in urine, -urinary frequency, -urgency, -incontinence Neurology: -headache, -weakness, -tingling, -numbness, -memory loss, -falls, -dizziness Psychology: -depressed mood, -agitation, -sleep problems Male GU: no  testicular mass, pain, no lymph nodes swollen, no swelling, no rash.     Objective:  BP 138/88   Pulse (!) 55   Ht 5\' 10"  (1.778 m)   Wt 221 lb (100.2 kg)   SpO2 94%   BMI 31.71 kg/m   Wt Readings from Last 3 Encounters:  03/14/20 221 lb (100.2 kg)  12/27/19 222 lb 9.6 oz (101 kg)  03/09/19 226 lb 3.2 oz (102.6 kg)   BP Readings from Last 3 Encounters:  03/14/20 138/88  12/27/19 (!) 162/88  03/09/19 (!) 146/88     General appearance: alert, no distress, WD/WN, Caucasian male Skin: Scattered macules, no worrisome lesions HEENT: normocephalic, conjunctiva/corneas normal, sclerae anicteric, PERRLA, EOMi, nares patent, no discharge or erythema, pharynx normal Oral cavity: MMM, tongue normal, teeth normal Neck: supple, no lymphadenopathy, no thyromegaly, no masses, normal ROM, no bruits Chest: Vertical surgical scar from prior CABG, non tender, normal shape and  expansion Heart: RRR, normal S1, S2, no murmurs Lungs: CTA bilaterally, no wheezes, rhonchi, or rales Abdomen: +bs, soft, non tender, non distended, no masses, no hepatomegaly, no splenomegaly, no bruits Back: non tender, normal ROM, no scoliosis Musculoskeletal: upper extremities non tender, no obvious deformity, normal ROM throughout, lower extremities non tender, no obvious deformity, normal ROM throughout Extremities: Right medial lower leg with surgical scar from prior bypass surgery, no edema, no cyanosis, no clubbing Pulses: 2+ symmetric, upper and lower extremities, normal cap refill Neurological: alert, oriented x 3, CN2-12 intact, strength normal upper extremities and lower extremities, sensation normal throughout, DTRs 2+ throughout, no cerebellar signs, gait normal Psychiatric: normal affect, behavior normal, pleasant  GU: normal male external genitalia,circumcised, nontender, no masses, no hernia, no lymphadenopathy Rectal: Declined   Diabetic Foot Exam - Simple   Simple Foot Form Diabetic Foot exam was  performed with the following findings: Yes 03/14/2020  9:57 AM  Visual Inspection No deformities, no ulcerations, no other skin breakdown bilaterally: Yes Sensation Testing Intact to touch and monofilament testing bilaterally: Yes Pulse Check Posterior Tibialis and Dorsalis pulse intact bilaterally: Yes Comments       Assessment and Plan :   Encounter Diagnoses  Name Primary?  . Encounter for health maintenance examination in adult Yes  . Essential hypertension   . Coronary artery disease involving native coronary artery of native heart without angina pectoris   . Mild intermittent extrinsic asthma without complication   . Allergic rhinitis, unspecified seasonality, unspecified trigger   . Gastroesophageal reflux disease without esophagitis   . Impaired fasting blood sugar   . Hypothyroidism (acquired)   . Controlled type 2 diabetes mellitus with complication, without long-term current use of insulin ([place])   . Statin-induced myositis   . Benign prostatic hyperplasia, unspecified whether lower urinary tract symptoms present   . Vaccine counseling   . Screening for prostate cancer   . S/P CABG (coronary artery bypass graft)   . Need for shingles vaccine   . Insomnia, unspecified type   . History of MI (myocardial infarction)   . Erectile dysfunction, unspecified erectile dysfunction type   . Dyslipidemia     Today you had a preventative care visit or wellness visit.    Topics today may have included healthy lifestyle, diet, exercise, preventative care, vaccinations, sick and well care, proper use of emergency dept and after hours care, as well as other concerns.     Recommendations: Continue to return yearly for your annual wellness and preventative care visits.  This gives [place] a chance to discuss healthy lifestyle, exercise, vaccinations, review your chart record, and perform screenings where appropriate.  I recommend you see your eye doctor yearly for routine vision  care.  I recommend you see your dentist yearly for routine dental care including hygiene visits twice yearly.   Vaccination recommendations were reviewed  Declines COVID-vaccine Declines influenza vaccine Up-to-date on tetanus vaccine Advised pneumococcal vaccine Up-to-date on shingles #1  Counseled on the Shingrix vaccine.  Vaccine information sheet given.     Screening for cancer: Colon cancer screening:  I reviewed your colonoscopy on file that is up to date from 03/2018, repeat 5 years  We discussed PSA, prostate exam, and prostate cancer screening risks/benefits.     Skin cancer screening: Check your skin regularly for new changes, growing lesions, or other lesions of concern Come in for evaluation if you have skin lesions of concern.  Lung cancer screening: If you have a greater than 30  pack year history of tobacco use, then you qualify for lung cancer screening with a chest CT scan  We currently don't have screenings for other cancers besides breast, cervical, colon, and lung cancers.  If you have a strong family history of cancer or have other cancer screening concerns, please let me know.    Bone health: Get at least 150 minutes of aerobic exercise weekly Get weight bearing exercise at least once weekly   Heart health: Get at least 150 minutes of aerobic exercise weekly Limit alcohol It is important to maintain a healthy blood pressure and healthy cholesterol numbers   Separate significant issues discussed: Diabetes-he wants to stick with metformin for now.  We discussed the benefits of other classes of medicine such as GLP-1 and SGL 2 including cardiac benefit.  He is reluctant to change anything just yet.  Advise glucometer testing regularly.  We discussed goal blood sugars  Hypertension- similar to diabetes, he does not want to change anything about his medicines at the moment.  We discussed that being we may need to add something given that he is running in  the borderline area and not quite at goal.  Discussed risk of uncontrolled high blood pressure  Dyslipidemia-continue statin and fenofibrate which he is tolerating at the moment  CAD, history of CABG- continue current medications, discussed goals of therapy, continue aspirin, good control blood pressure and diabetes.  Consider cardiology f/u.  Obesity-discussed need to lose weight through healthy diet and exercise  Asthma-no recent complaints  BPH and ED-doing fine on Cialis 5 mg daily  Hypothyroidism-continue current medicine    Kaliq was seen today for annual exam.  Diagnoses and all orders for this visit:  Encounter for health maintenance examination in adult -     Comprehensive metabolic panel -     CBC -     Lipid panel -     Hemoglobin A1c -     Microalbumin/Creatinine Ratio, Urine -     PSA -     TSH -     POCT Urinalysis DIP (Proadvantage Device)  Essential hypertension  Coronary artery disease involving native coronary artery of native heart without angina pectoris  Mild intermittent extrinsic asthma without complication  Allergic rhinitis, unspecified seasonality, unspecified trigger  Gastroesophageal reflux disease without esophagitis  Impaired fasting blood sugar  Hypothyroidism (acquired) -     TSH  Controlled type 2 diabetes mellitus with complication, without long-term current use of insulin (HCC) -     Hemoglobin A1c  Statin-induced myositis  Benign prostatic hyperplasia, unspecified whether lower urinary tract symptoms present  Vaccine counseling  Screening for prostate cancer -     PSA  S/P CABG (coronary artery bypass graft)  Need for shingles vaccine  Insomnia, unspecified type  History of MI (myocardial infarction)  Erectile dysfunction, unspecified erectile dysfunction type  Dyslipidemia -     Lipid panel  Other orders -     Varicella-zoster vaccine IM (Shingrix)     Follow-up pending labs, yearly for physical

## 2020-03-15 LAB — CBC
Hematocrit: 46 % (ref 37.5–51.0)
Hemoglobin: 15.2 g/dL (ref 13.0–17.7)
MCH: 29.3 pg (ref 26.6–33.0)
MCHC: 33 g/dL (ref 31.5–35.7)
MCV: 89 fL (ref 79–97)
Platelets: 209 10*3/uL (ref 150–450)
RBC: 5.19 x10E6/uL (ref 4.14–5.80)
RDW: 12.9 % (ref 11.6–15.4)
WBC: 6.6 10*3/uL (ref 3.4–10.8)

## 2020-03-15 LAB — LIPID PANEL
Chol/HDL Ratio: 3.2 ratio (ref 0.0–5.0)
Cholesterol, Total: 125 mg/dL (ref 100–199)
HDL: 39 mg/dL — ABNORMAL LOW (ref >39)
LDL Chol Calc (NIH): 70 mg/dL (ref 0–99)
Triglycerides: 83 mg/dL (ref 0–149)
VLDL Cholesterol Cal: 16 mg/dL (ref 5–40)

## 2020-03-15 LAB — TSH: TSH: 5.96 u[IU]/mL — ABNORMAL HIGH (ref 0.450–4.500)

## 2020-03-15 LAB — HEMOGLOBIN A1C
Est. average glucose Bld gHb Est-mCnc: 160 mg/dL
Hgb A1c MFr Bld: 7.2 % — ABNORMAL HIGH (ref 4.8–5.6)

## 2020-03-15 LAB — COMPREHENSIVE METABOLIC PANEL
ALT: 26 IU/L (ref 0–44)
AST: 22 IU/L (ref 0–40)
Albumin/Globulin Ratio: 1.8 (ref 1.2–2.2)
Albumin: 4.8 g/dL (ref 3.8–4.9)
Alkaline Phosphatase: 50 IU/L (ref 44–121)
BUN/Creatinine Ratio: 9 (ref 9–20)
BUN: 12 mg/dL (ref 6–24)
Bilirubin Total: 0.5 mg/dL (ref 0.0–1.2)
CO2: 25 mmol/L (ref 20–29)
Calcium: 9.8 mg/dL (ref 8.7–10.2)
Chloride: 104 mmol/L (ref 96–106)
Creatinine, Ser: 1.37 mg/dL — ABNORMAL HIGH (ref 0.76–1.27)
GFR calc Af Amer: 66 mL/min/{1.73_m2} (ref >59)
GFR calc non Af Amer: 57 mL/min/{1.73_m2} — ABNORMAL LOW (ref >59)
Globulin, Total: 2.6 g/dL (ref 1.5–4.5)
Glucose: 118 mg/dL — ABNORMAL HIGH (ref 65–99)
Potassium: 4.3 mmol/L (ref 3.5–5.2)
Sodium: 144 mmol/L (ref 134–144)
Total Protein: 7.4 g/dL (ref 6.0–8.5)

## 2020-03-15 LAB — MICROALBUMIN / CREATININE URINE RATIO
Creatinine, Urine: 122.2 mg/dL
Microalb/Creat Ratio: <2 mg/g creat (ref 0–29)
Microalbumin, Urine: <3 ug/mL

## 2020-03-15 LAB — PSA: Prostate Specific Ag, Serum: 1.9 ng/mL (ref 0.0–4.0)

## 2020-03-17 ENCOUNTER — Other Ambulatory Visit: Payer: Self-pay | Admitting: Medical

## 2020-03-18 ENCOUNTER — Other Ambulatory Visit: Payer: Self-pay

## 2020-03-18 ENCOUNTER — Ambulatory Visit
Admission: EM | Admit: 2020-03-18 | Discharge: 2020-03-18 | Disposition: A | Discharge status: Home / Self Care | Payer: No Typology Code available for payment source | Attending: Family Medicine | Admitting: Family Medicine

## 2020-03-18 ENCOUNTER — Encounter: Payer: Self-pay | Admitting: Emergency Medicine

## 2020-03-18 ENCOUNTER — Other Ambulatory Visit: Payer: Self-pay | Admitting: Medical

## 2020-03-18 ENCOUNTER — Telehealth: Payer: Self-pay

## 2020-03-18 DIAGNOSIS — S60451A Superficial foreign body of left index finger, initial encounter: Secondary | ICD-10-CM | POA: Diagnosis not present

## 2020-03-18 MED ORDER — TADALAFIL 5 MG PO TABS: 5.0000 mg | ORAL_TABLET | Freq: Every day | ORAL | 3 refills | Status: DC

## 2020-03-18 NOTE — Telephone Encounter (Signed)
Patient confirmed he wanted medication to go to [facility]

## 2020-03-18 NOTE — Telephone Encounter (Signed)
Pt. Requesting refill on Tadalafil pt. Last apt was 03/14/20 and next apt is 03/19/21.

## 2020-03-18 NOTE — ED Provider Notes (Signed)
[facility]    CSN: [id number] Arrival date & time: 03/18/20  3235  History   Chief Complaint Chief Complaint  Patient presents with  . Puncture Wound  . Foreign Body in Skin   HPI  55 year old male presents with the above complaints.  Patient reports that he was putting up a wood cabinet yesterday.  He had a piece of wood stuck in his left index finger.  He states that he can feel the foreign body.  He has attempted to remove it and has been unsuccessful.  He has clean the wound.  He reports pain and swelling.  Tetanus up-to-date.  Pain 3/10 in severity.  No other associated symptoms.  No other complaints.  Past Medical History:  Diagnosis Date  . Acid reflux   . Allergic asthma    rare flare up, worse with alllergies as of 12/2016  . Coronary artery disease   . DVT (deep venous thrombosis) ([place]) 01/2001   after CABG  . Dyslipidemia   . Hyperlipidemia   . Hypertension   . Hypothyroidism   . Impaired fasting blood sugar   . MI (myocardial infarction) ([place]) july of 2000   stent  . Palpitations    hx of  . Thyroid disease    hypothyroidism  . Tobacco use    vape, no cigarrette since 04/2015    Patient Active Problem List   Diagnosis Date Noted  . Encounter for health maintenance examination in adult 03/14/2020  . Controlled type 2 diabetes mellitus with complication, without long-term current use of insulin ([place]) 12/27/2019  . Need for shingles vaccine 12/27/2019  . Scrotal pain 03/09/2019  . Scrotal swelling 03/09/2019  . Extrinsic asthma 03/09/2019  . Fatigue 08/11/2018  . Encounter for screening colonoscopy   . Allergic rhinitis 01/16/2018  . Insomnia 01/16/2018  . Vaccine counseling 01/14/2017  . Need for influenza vaccination 01/14/2017  . History of MI (myocardial infarction) 12/18/2015  . Screening for prostate cancer 12/18/2015  . Impaired fasting blood sugar 12/18/2015  . Statin-induced myositis 12/18/2015  . Erectile dysfunction  11/18/2015  . Hypothyroidism (acquired) 11/18/2015  . Essential hypertension 11/18/2015  . BPH (benign prostatic hyperplasia) 11/18/2015  . S/P CABG (coronary artery bypass graft) 03/31/2011  . Coronary artery disease   . Dyslipidemia   . Acid reflux     Past Surgical History:  Procedure Laterality Date  . APPENDECTOMY  2001  . CARDIAC CATHETERIZATION  2000   stent  . COLONOSCOPY WITH PROPOFOL N/A 03/17/2018   Procedure: COLONOSCOPY WITH PROPOFOL;  Surgeon: Lucilla Lame, MD;  Location: [facility];  Service: Endoscopy;  Laterality: N/A;  . CORONARY ANGIOPLASTY  jan. 2001  . CORONARY ANGIOPLASTY WITH STENT PLACEMENT  june 2001   stent  . CORONARY ARTERY BYPASS GRAFT  dec. 2002   LIMA graft to LAD,RIMA graft to the intermediate branch and saphenous vein graft  to the posterior lateral and posterior descending branches of the right coronary  . POLYPECTOMY  03/17/2018   Procedure: POLYPECTOMY;  Surgeon: Lucilla Lame, MD;  Location: [place];  Service: Endoscopy;;       Home Medications    Prior to Admission medications   Medication Sig Start Date End Date Taking? Authorizing Provider  aspirin (ASPIRIN LOW DOSE) 81 MG EC tablet Take 1 tablet (81 mg total) by mouth daily. Swallow whole. 12/27/19  Yes Tysinger, Camelia Eng, PA-C  escitalopram (LEXAPRO) 10 MG tablet TAKE 1 TABLET BY MOUTH  DAILY 04/06/19  Yes  Tysinger, Camelia Eng, PA-C  fenofibrate (TRICOR) 145 MG tablet TAKE 1 TABLET BY MOUTH  DAILY 02/25/20  Yes Tysinger, Camelia Eng, PA-C  levothyroxine (SYNTHROID) 137 MCG tablet TAKE 1 TABLET BY MOUTH  DAILY BEFORE BREAKFAST 06/11/19  Yes Tysinger, Camelia Eng, PA-C  metFORMIN (GLUCOPHAGE) 500 MG tablet Take 1 tablet (500 mg total) by mouth daily with breakfast. 12/28/19  Yes Tysinger, Camelia Eng, PA-C  PROAIR RESPICLICK 175 (90 Base) MCG/ACT AEPB USE 2 INHALATIONS BY MOUTH  EVERY 6 HOURS AS NEEDED 02/05/19  Yes Tysinger, Camelia Eng, PA-C  rosuvastatin (CRESTOR) 20 MG tablet TAKE 1 TABLET  BY MOUTH AT  BEDTIME. MAY USE 1/2 TABLET IF NEEDED Patient taking differently: Take by mouth daily. 06/11/19  Yes Tysinger, Camelia Eng, PA-C  tadalafil (CIALIS) 5 MG tablet Take 1 tablet (5 mg total) by mouth daily. 12/27/19  Yes Tysinger, Camelia Eng, PA-C  irbesartan (AVAPRO) 150 MG tablet TAKE 1 TABLET BY MOUTH  DAILY 04/23/19 03/18/20  Tysinger, Camelia Eng, PA-C  losartan (COZAAR) 100 MG tablet Take 1 tablet (100 mg total) by mouth daily. 04/18/15 07/11/15  Martin[place], Peter M, MD  pantoprazole (PROTONIX) 40 MG tablet Take 1 tablet (40 mg total) by mouth daily. **PLEASE SCHEDULE FOLLOW UP APPT** Patient not taking: No sig reported 12/20/19 03/18/20  Lucilla Lame, MD    Family History Family History  Problem Relation Age of Onset  . Liver cancer Mother   . Cancer Mother   . Heart disease Father        cabg , arrhythmia  . Hypertension Father   . Valvular heart disease Father   . Heart disease Sister        arrhythmia  . Thyroid disease Brother   . Diabetes Paternal Grandmother   . Cancer Paternal Grandfather        leukemia  . Cancer Brother 69       lung and brain, seizures  . Liver disease Brother   . Asthma Brother   . Suicidality Brother   . Multiple sclerosis Sister   . Prostate cancer Neg Hx   . Kidney cancer Neg Hx     Social History Social History   Tobacco Use  . Smoking status: Former Smoker    Packs/day: 0.50    Years: 10.00    Pack years: 5.00    Types: Cigarettes    Quit date: 04/08/2015    Years since quitting: 4.9  . Smokeless tobacco: Never Used  Vaping Use  . Vaping Use: Every day  . Start date: 04/08/2015  . Substances: Nicotine, Flavoring  . Devices:    Substance Use Topics  . Alcohol use: Yes    Alcohol/week: 5.0 standard drinks    Types: 5 Cans of beer per week    Comment: occ.  . Drug use: No     Allergies   Farxiga [dapagliflozin], Statins, and Tetracycline   Review of Systems Review of Systems  Constitutional: Negative.   Skin:       Foreign body  - left index finger.    Physical Exam Triage Vital Signs ED Triage Vitals  Enc Vitals Group     BP 03/18/20 0826 (!) 147/74     Pulse Rate 03/18/20 0826 (!) 57     Resp 03/18/20 0826 18     Temp 03/18/20 0826 98.6 F (37 C)     Temp Source 03/18/20 0826 Oral     SpO2 03/18/20 0826 99 %     Weight 03/18/20  0824 220 lb 14.4 oz (100.2 kg)     Height 03/18/20 0824 5\' 10"  (1.778 m)     Head Circumference --      Peak Flow --      Pain Score 03/18/20 0823 3     Pain Loc --      Pain Edu? --      Excl. in [place]? --    Updated Vital Signs BP (!) 147/74 (BP Location: Left Arm)   Pulse (!) 57   Temp 98.6 F (37 C) (Oral)   Resp 18   Ht 5\' 10"  (1.778 m)   Wt 100.2 kg   SpO2 99%   BMI 31.70 kg/m   Visual Acuity Right Eye Distance:   Left Eye Distance:   Bilateral Distance:    Right Eye Near:   Left Eye Near:    Bilateral Near:     Physical Exam Vitals and nursing note reviewed.  Constitutional:      General: He is not in acute distress.    Appearance: Normal appearance. He is not ill-appearing.  HENT:     Head: Normocephalic and atraumatic.  Eyes:     General:        Right eye: No discharge.        Left eye: No discharge.     Conjunctiva/sclera: Conjunctivae normal.  Skin:    Comments: Left index finger -palpable foreign body. Mild tenderness to palpation.   Neurological:     Mental Status: He is alert.  Psychiatric:        Mood and Affect: Mood normal.        Behavior: Behavior normal.    UC Treatments / Results  Labs (all labs ordered are listed, but only abnormal results are displayed) Labs Reviewed - No data to display  EKG   Radiology No results found.  Procedures Procedures (including critical care time)  Medications Ordered in UC Medications - No data to display  Initial Impression / Assessment and Plan / UC Course  I have reviewed the triage vital signs and the nursing notes.  Pertinent labs & imaging results that were available during my  care of the patient were reviewed by me and considered in my medical decision making (see chart for details).    55 year old male presents with a foreign body in his left index finger.  Foreign body was pushed through open wound and was grasped and removed intact with a pair forceps.  Wound was cleaned.  No evidence of infection at this time.  Advised supportive care.  Advised to keep a close eye.  Final Clinical Impressions(s) / UC Diagnoses   Final diagnoses:  Foreign body of left index finger     Discharge Instructions     Keep clean.  If you developing worsening redness, drainage, pain please let me know.  Take care  Dr. Lacinda Axon    ED Prescriptions    None     PDMP not reviewed this encounter.   Thersa Salt [place], [place] 03/18/20 [phone]

## 2020-03-18 NOTE — Discharge Instructions (Signed)
Keep clean.  If you developing worsening redness, drainage, pain please let me know.  Take care  Dr. Lacinda Axon

## 2020-03-18 NOTE — Telephone Encounter (Signed)
Please determine which pharmacy and send it to the pharmacy.  Changes the " no print" to normal so it goes electronically.

## 2020-03-18 NOTE — ED Triage Notes (Signed)
Pt states he was putting up a wooden cabinet yesterday and a piece of wood stuck him in his left index finger. He states he can feel wood on the opposite side of his finger and the finger is swollen. Last tdap was 11/18/15.

## 2020-03-20 ENCOUNTER — Telehealth: Payer: Self-pay

## 2020-03-20 NOTE — Telephone Encounter (Signed)
Can you put discontinue on the [facility] request and sent it back

## 2020-03-20 NOTE — Telephone Encounter (Signed)
Called pt. LM to confirm pharmacy.

## 2020-03-20 NOTE — Telephone Encounter (Signed)
Received fax from [facility] for a refill on the pts. Tadalafil 5mg  pt. Last apt 03/14/20 and next apt is 03/19/21.

## 2020-03-20 NOTE — Telephone Encounter (Signed)
Pt. Called back stating that [facility] is the correct pharmacy and wasn't sure why [facility] was still sending [place] request for med refills. He had to change to [facility] due to ins.

## 2020-03-20 NOTE — Telephone Encounter (Signed)
I literally just sent 90-day supply with 3 refills on January 18 to [place].  Is it [facility]?

## 2020-04-21 ENCOUNTER — Other Ambulatory Visit: Payer: Self-pay | Admitting: Medical

## 2020-04-21 MED ORDER — LEVOTHYROXINE SODIUM 150 MCG PO TABS: 150.0000 ug | ORAL_TABLET | Freq: Every day | ORAL | 1 refills | Status: DC

## 2020-04-28 ENCOUNTER — Other Ambulatory Visit: Payer: Self-pay | Admitting: Medical

## 2020-04-28 ENCOUNTER — Telehealth: Payer: Self-pay | Admitting: Medical

## 2020-04-28 DIAGNOSIS — F430 Acute stress reaction: Secondary | ICD-10-CM

## 2020-04-28 MED ORDER — ESCITALOPRAM OXALATE 10 MG PO TABS: 10.0000 mg | ORAL_TABLET | Freq: Every day | ORAL | 3 refills | Status: AC

## 2020-04-28 NOTE — Telephone Encounter (Signed)
Last year it went to mail order which is what I did for 90 day supply.   Always remind [place] where you want 90 day supply sent to.  [facility]  Thanks

## 2020-04-28 NOTE — Telephone Encounter (Signed)
Pt needs refill on [facility]ram [facility] [place]. He said his Levothyroxine was sent to the wrong pharmacy so he will need that sent to the [facility] [place] [facility].

## 2020-06-16 ENCOUNTER — Other Ambulatory Visit: Payer: Self-pay

## 2020-06-16 ENCOUNTER — Telehealth: Payer: Self-pay | Admitting: Medical

## 2020-06-16 MED ORDER — LEVOTHYROXINE SODIUM 150 MCG PO TABS: 150.0000 ug | ORAL_TABLET | Freq: Every day | ORAL | 1 refills | Status: DC

## 2020-06-16 NOTE — Telephone Encounter (Signed)
Pt called and is requesting a refill of the synthroid please send it to [id number] [place], [place] - [address]

## 2020-06-17 NOTE — Telephone Encounter (Signed)
Medication was sent per patient request.

## 2020-07-05 IMAGING — US US ABDOMEN LIMITED
1 series · 14 of 25 positions shown · non-contrast
Comparison: None.

CLINICAL DATA: 52-year-old male with a history of vomiting and
diarrhea for 2 weeks

EXAM:
ULTRASOUND ABDOMEN LIMITED RIGHT UPPER QUADRANT

[Series 1: us abdomen limited · 0.20mm/px · 14 of 41 slices shown]
[im 1/41]
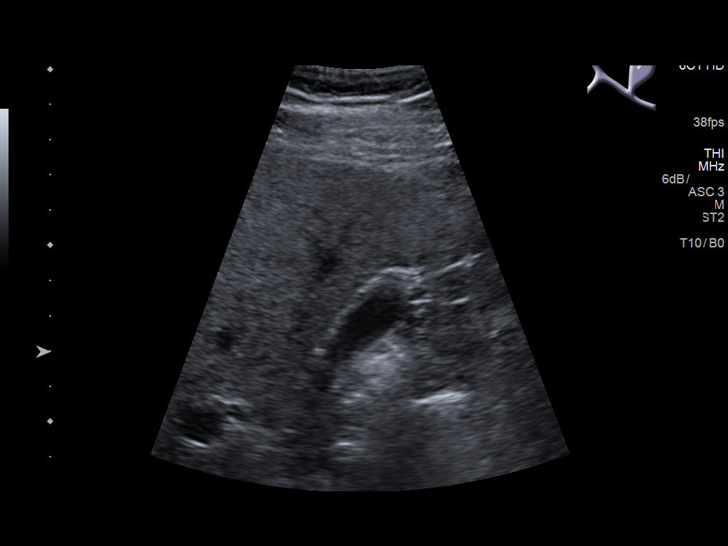
[im 4/41]
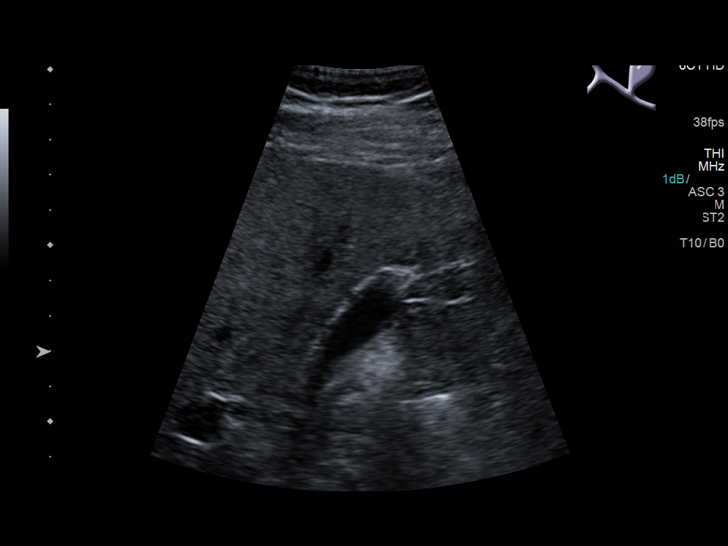
[im 7/41]
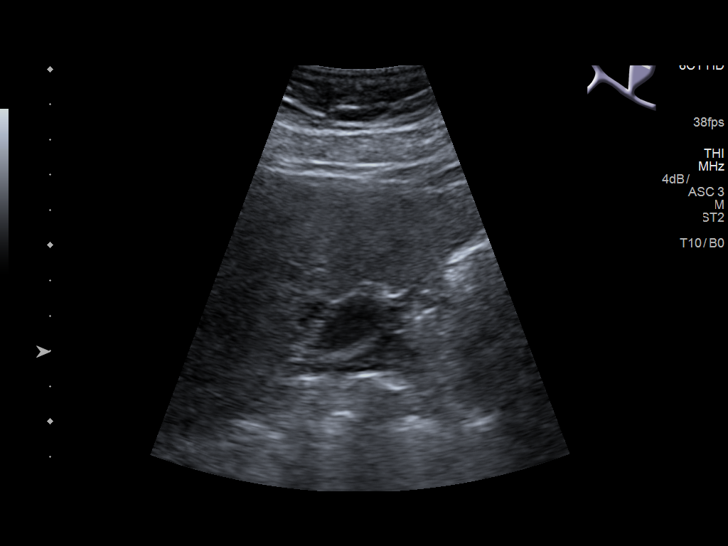
[im 11/41]
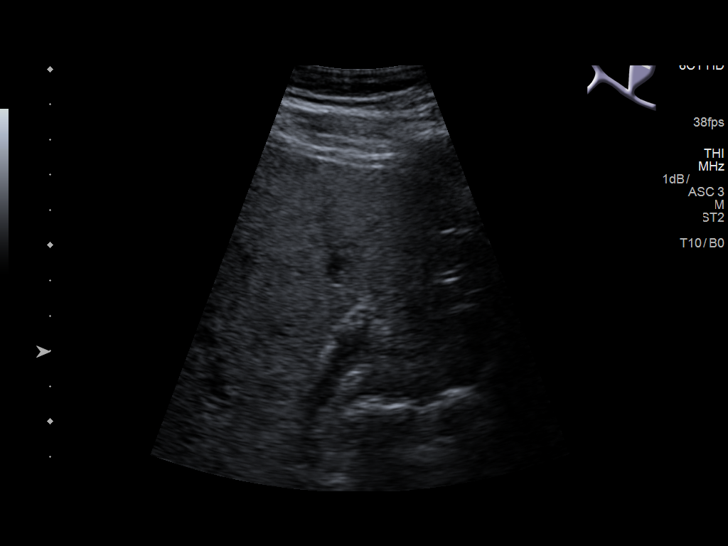
[im 14/41]
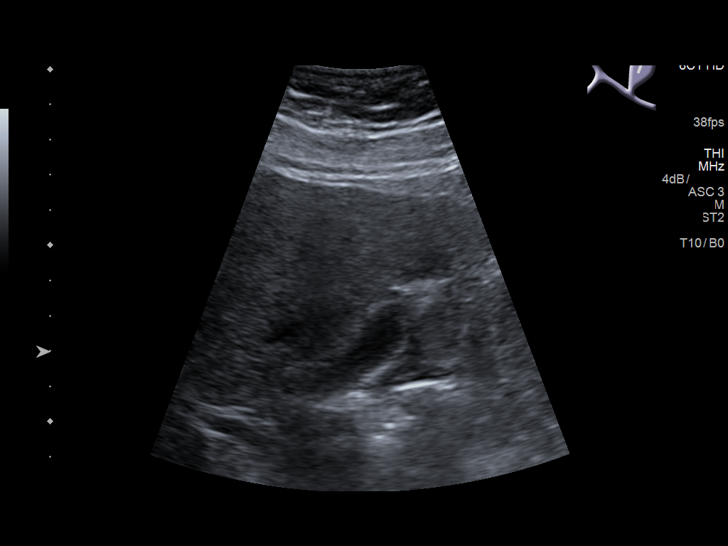
[im 16/41]
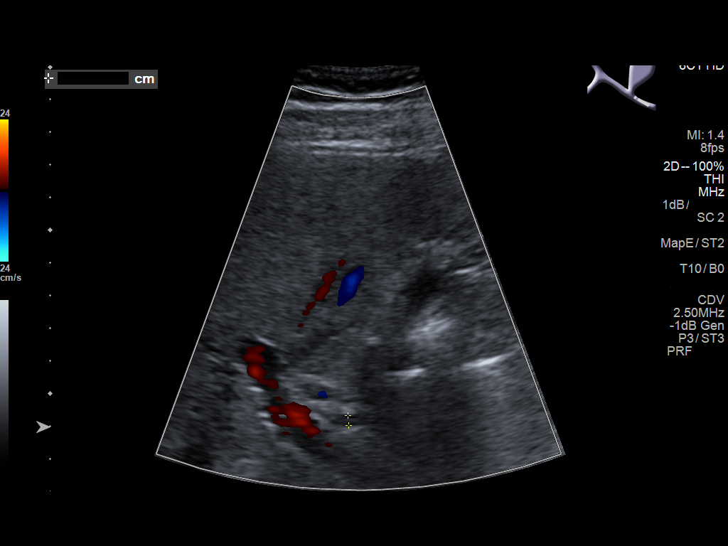
[im 19/41]
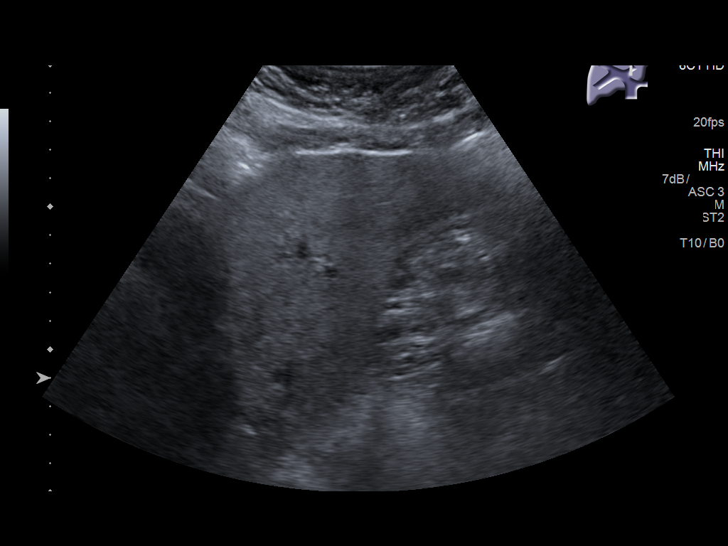
[im 22/41]
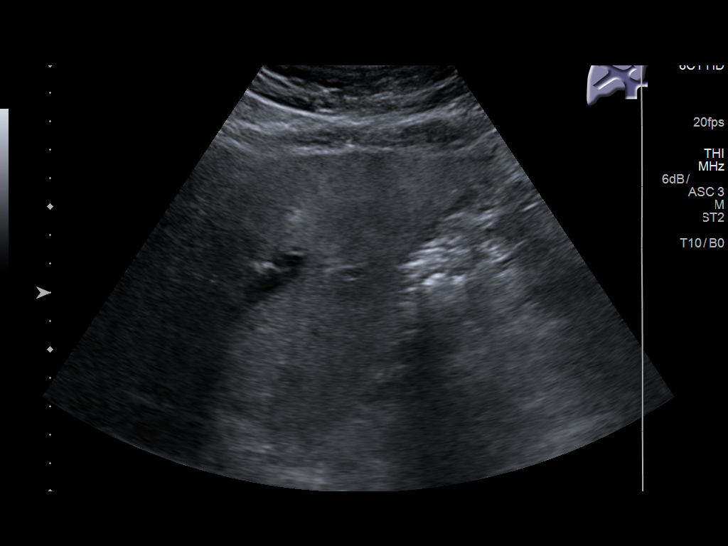
[im 26/41]
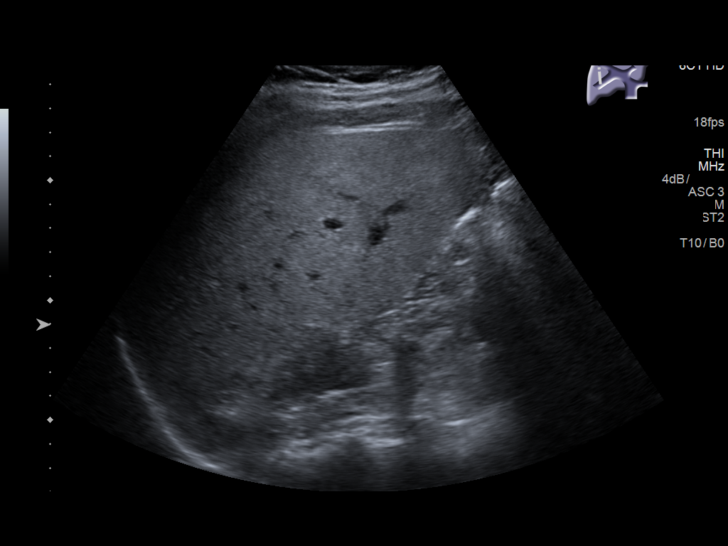
[im 27/41]
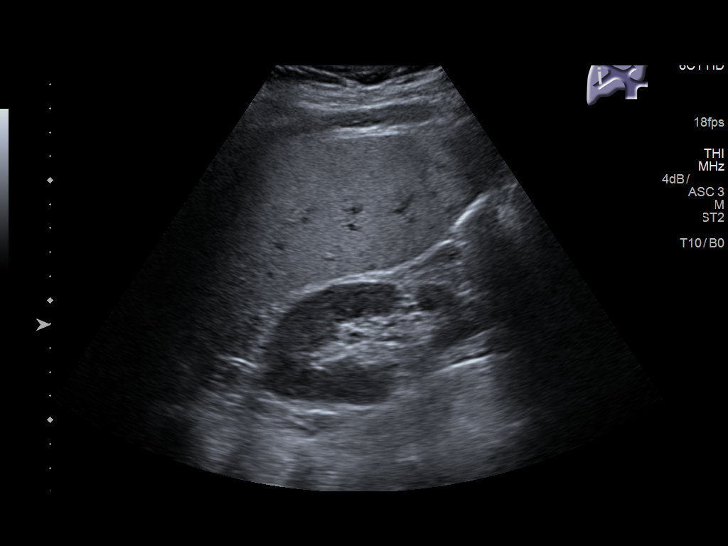
[im 31/41]
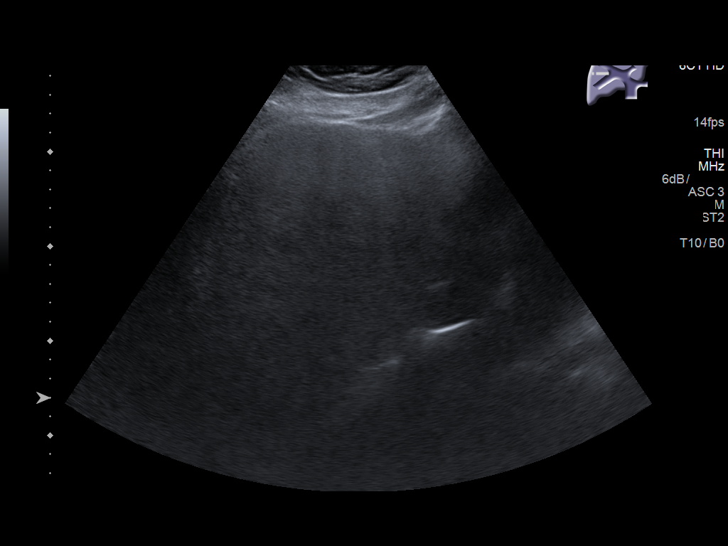
[im 34/41]
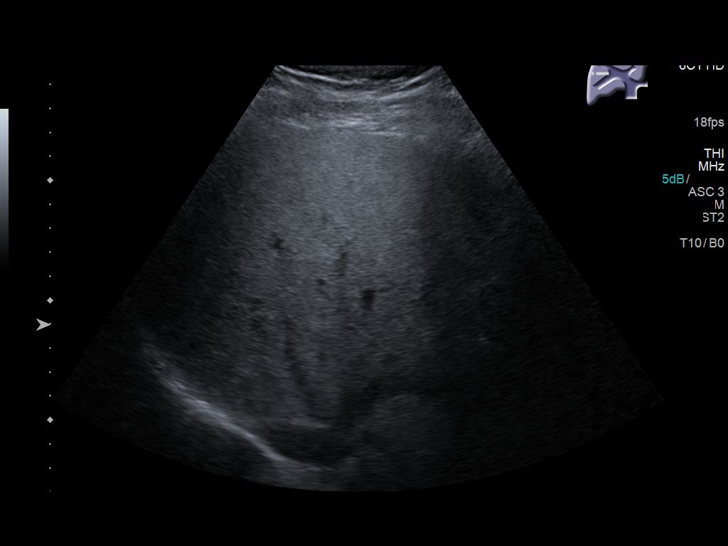
[im 37/41]
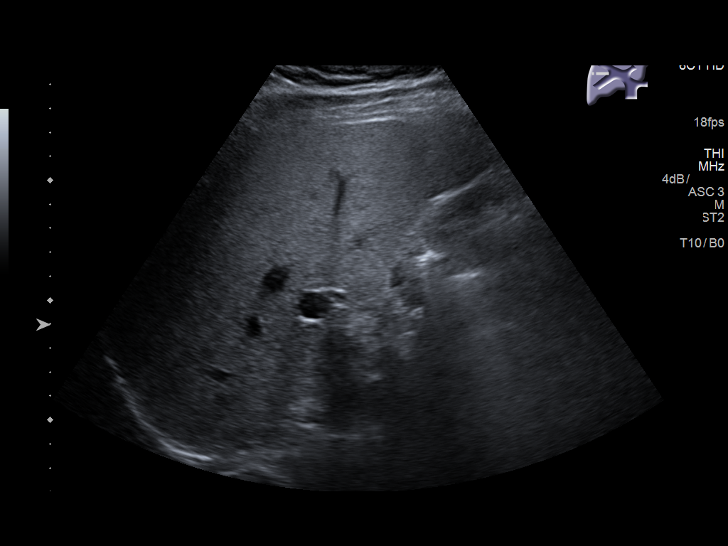
[im 41/41]
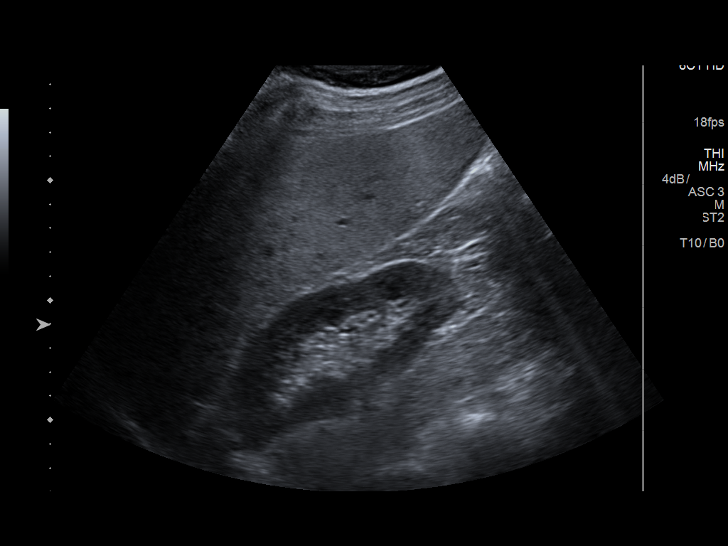

[14 of 25 positions shown; findings below may reference images not displayed]

FINDINGS: Gallbladder:

No gallstones or wall thickening visualized. No sonographic Murphy
sign noted by sonographer.

Common bile duct:

Diameter: 3 mm

Liver:

Heterogeneous echotexture of liver parenchyma, somewhat increased.
No nodular contour. No focal lesion. Portal vein is patent on color
Doppler imaging with normal direction of blood flow towards the
liver.
IMPRESSION: Unremarkable appearance of the gallbladder.

Increased echogenicity of the liver, may represent medical liver
disease.

## 2020-07-25 ENCOUNTER — Inpatient Hospital Stay
Admission: EM | Admit: 2020-07-25 | Discharge: 2020-07-29 | DRG: 281 | Disposition: A | Discharge status: Home / Self Care | Payer: No Typology Code available for payment source | Attending: Obstetrics and Gynecology | Admitting: Obstetrics and Gynecology

## 2020-07-25 ENCOUNTER — Emergency Department: Payer: No Typology Code available for payment source

## 2020-07-25 ENCOUNTER — Other Ambulatory Visit: Payer: Self-pay

## 2020-07-25 ENCOUNTER — Encounter: Payer: Self-pay | Admitting: Emergency Medicine

## 2020-07-25 DIAGNOSIS — E782 Mixed hyperlipidemia: Secondary | ICD-10-CM | POA: Diagnosis not present

## 2020-07-25 DIAGNOSIS — Z951 Presence of aortocoronary bypass graft: Secondary | ICD-10-CM | POA: Diagnosis not present

## 2020-07-25 DIAGNOSIS — R079 Chest pain, unspecified: Secondary | ICD-10-CM

## 2020-07-25 DIAGNOSIS — J45909 Unspecified asthma, uncomplicated: Secondary | ICD-10-CM | POA: Diagnosis present

## 2020-07-25 DIAGNOSIS — I25110 Atherosclerotic heart disease of native coronary artery with unstable angina pectoris: Secondary | ICD-10-CM | POA: Diagnosis present

## 2020-07-25 DIAGNOSIS — E785 Hyperlipidemia, unspecified: Secondary | ICD-10-CM | POA: Diagnosis present

## 2020-07-25 DIAGNOSIS — Z888 Allergy status to other drugs, medicaments and biological substances status: Secondary | ICD-10-CM

## 2020-07-25 DIAGNOSIS — Z79899 Other long term (current) drug therapy: Secondary | ICD-10-CM

## 2020-07-25 DIAGNOSIS — Z825 Family history of asthma and other chronic lower respiratory diseases: Secondary | ICD-10-CM | POA: Diagnosis not present

## 2020-07-25 DIAGNOSIS — Z9119 Patient's noncompliance with other medical treatment and regimen: Secondary | ICD-10-CM

## 2020-07-25 DIAGNOSIS — Z79890 Hormone replacement therapy (postmenopausal): Secondary | ICD-10-CM

## 2020-07-25 DIAGNOSIS — Z8249 Family history of ischemic heart disease and other diseases of the circulatory system: Secondary | ICD-10-CM

## 2020-07-25 DIAGNOSIS — I5020 Unspecified systolic (congestive) heart failure: Secondary | ICD-10-CM | POA: Diagnosis present

## 2020-07-25 DIAGNOSIS — E1165 Type 2 diabetes mellitus with hyperglycemia: Secondary | ICD-10-CM | POA: Diagnosis not present

## 2020-07-25 DIAGNOSIS — Z7982 Long term (current) use of aspirin: Secondary | ICD-10-CM

## 2020-07-25 DIAGNOSIS — Z955 Presence of coronary angioplasty implant and graft: Secondary | ICD-10-CM

## 2020-07-25 DIAGNOSIS — I10 Essential (primary) hypertension: Secondary | ICD-10-CM | POA: Diagnosis not present

## 2020-07-25 DIAGNOSIS — K219 Gastro-esophageal reflux disease without esophagitis: Secondary | ICD-10-CM | POA: Diagnosis present

## 2020-07-25 DIAGNOSIS — E876 Hypokalemia: Secondary | ICD-10-CM | POA: Diagnosis present

## 2020-07-25 DIAGNOSIS — I2510 Atherosclerotic heart disease of native coronary artery without angina pectoris: Secondary | ICD-10-CM | POA: Diagnosis present

## 2020-07-25 DIAGNOSIS — Z9114 Patient's other noncompliance with medication regimen: Secondary | ICD-10-CM

## 2020-07-25 DIAGNOSIS — R001 Bradycardia, unspecified: Secondary | ICD-10-CM | POA: Diagnosis present

## 2020-07-25 DIAGNOSIS — N400 Enlarged prostate without lower urinary tract symptoms: Secondary | ICD-10-CM | POA: Diagnosis present

## 2020-07-25 DIAGNOSIS — E119 Type 2 diabetes mellitus without complications: Secondary | ICD-10-CM | POA: Diagnosis present

## 2020-07-25 DIAGNOSIS — Z86718 Personal history of other venous thrombosis and embolism: Secondary | ICD-10-CM

## 2020-07-25 DIAGNOSIS — Z20822 Contact with and (suspected) exposure to covid-19: Secondary | ICD-10-CM | POA: Diagnosis present

## 2020-07-25 DIAGNOSIS — Z833 Family history of diabetes mellitus: Secondary | ICD-10-CM

## 2020-07-25 DIAGNOSIS — E118 Type 2 diabetes mellitus with unspecified complications: Secondary | ICD-10-CM | POA: Diagnosis present

## 2020-07-25 DIAGNOSIS — I200 Unstable angina: Secondary | ICD-10-CM | POA: Diagnosis present

## 2020-07-25 DIAGNOSIS — I255 Ischemic cardiomyopathy: Secondary | ICD-10-CM | POA: Diagnosis present

## 2020-07-25 DIAGNOSIS — Z87891 Personal history of nicotine dependence: Secondary | ICD-10-CM

## 2020-07-25 DIAGNOSIS — G4730 Sleep apnea, unspecified: Secondary | ICD-10-CM | POA: Diagnosis present

## 2020-07-25 DIAGNOSIS — I214 Non-ST elevation (NSTEMI) myocardial infarction: Secondary | ICD-10-CM | POA: Diagnosis present

## 2020-07-25 DIAGNOSIS — I252 Old myocardial infarction: Secondary | ICD-10-CM | POA: Diagnosis not present

## 2020-07-25 DIAGNOSIS — I110 Hypertensive heart disease with heart failure: Secondary | ICD-10-CM | POA: Diagnosis present

## 2020-07-25 DIAGNOSIS — E039 Hypothyroidism, unspecified: Secondary | ICD-10-CM | POA: Diagnosis present

## 2020-07-25 DIAGNOSIS — I25810 Atherosclerosis of coronary artery bypass graft(s) without angina pectoris: Secondary | ICD-10-CM | POA: Diagnosis not present

## 2020-07-25 DIAGNOSIS — N529 Male erectile dysfunction, unspecified: Secondary | ICD-10-CM | POA: Diagnosis present

## 2020-07-25 DIAGNOSIS — E1159 Type 2 diabetes mellitus with other circulatory complications: Secondary | ICD-10-CM | POA: Diagnosis not present

## 2020-07-25 DIAGNOSIS — Z7984 Long term (current) use of oral hypoglycemic drugs: Secondary | ICD-10-CM

## 2020-07-25 HISTORY — DX: Hyperlipidemia, unspecified: E78.5

## 2020-07-25 HISTORY — DX: Male erectile dysfunction, unspecified: N52.9

## 2020-07-25 HISTORY — DX: Type 2 diabetes mellitus without complications: E11.9

## 2020-07-25 HISTORY — DX: Pericardial effusion (noninflammatory): I31.3

## 2020-07-25 HISTORY — DX: Other pericardial effusion (noninflammatory): I31.39

## 2020-07-25 LAB — CBC
HCT: 42.6 % (ref 39.0–52.0)
Hemoglobin: 14.8 g/dL (ref 13.0–17.0)
MCH: 29.4 pg (ref 26.0–34.0)
MCHC: 34.7 g/dL (ref 30.0–36.0)
MCV: 84.7 fL (ref 80.0–100.0)
Platelets: 200 10*3/uL (ref 150–400)
RBC: 5.03 MIL/uL (ref 4.22–5.81)
RDW: 12.5 % (ref 11.5–15.5)
WBC: 10.1 10*3/uL (ref 4.0–10.5)
nRBC: 0 % (ref 0.0–0.2)

## 2020-07-25 LAB — BASIC METABOLIC PANEL
Anion gap: 10 (ref 5–15)
BUN: 13 mg/dL (ref 6–20)
CO2: 23 mmol/L (ref 22–32)
Calcium: 9.9 mg/dL (ref 8.9–10.3)
Chloride: 105 mmol/L (ref 98–111)
Creatinine, Ser: 1.26 mg/dL — ABNORMAL HIGH (ref 0.61–1.24)
GFR, Estimated: >60 mL/min (ref >60)
Glucose, Bld: 151 mg/dL — ABNORMAL HIGH (ref 70–99)
Potassium: 3.3 mmol/L — ABNORMAL LOW (ref 3.5–5.1)
Sodium: 138 mmol/L (ref 135–145)

## 2020-07-25 LAB — APTT: aPTT: 26 seconds (ref 24–36)

## 2020-07-25 LAB — TROPONIN I (HIGH SENSITIVITY)
Troponin I (High Sensitivity): 168 ng/L (ref <18)
Troponin I (High Sensitivity): 609 ng/L (ref <18)

## 2020-07-25 LAB — PROTIME-INR
INR: 1 (ref 0.8–1.2)
Prothrombin Time: 13 seconds (ref 11.4–15.2)

## 2020-07-25 LAB — GLUCOSE, CAPILLARY: Glucose-Capillary: 143 mg/dL — ABNORMAL HIGH (ref 70–99)

## 2020-07-25 MED ORDER — MORPHINE SULFATE (PF) 2 MG/ML IV SOLN
2.0000 mg | INTRAVENOUS | Status: DC | PRN
  Administered 2020-07-25: 2 mg via INTRAVENOUS
  Filled 2020-07-25: qty 1

## 2020-07-25 MED ORDER — ACETAMINOPHEN 325 MG PO TABS
650.0000 mg | ORAL_TABLET | ORAL | Status: DC | PRN
  Administered 2020-07-27 – 2020-07-28 (×2): 650 mg via ORAL
  Filled 2020-07-25 (×2): qty 2

## 2020-07-25 MED ORDER — INSULIN ASPART 100 UNIT/ML IJ SOLN
0.0000 [IU] | Freq: Three times a day (TID) | INTRAMUSCULAR | Status: DC
  Administered 2020-07-26 – 2020-07-27 (×4): 3 [IU] via SUBCUTANEOUS
  Administered 2020-07-28: 2 [IU] via SUBCUTANEOUS
  Administered 2020-07-29: 3 [IU] via SUBCUTANEOUS
  Filled 2020-07-25 (×6): qty 1

## 2020-07-25 MED ORDER — INSULIN ASPART 100 UNIT/ML IJ SOLN
0.0000 [IU] | Freq: Every day | INTRAMUSCULAR | Status: DC
  Administered 2020-07-27 – 2020-07-28 (×2): 2 [IU] via SUBCUTANEOUS
  Filled 2020-07-25 (×2): qty 1

## 2020-07-25 MED ORDER — FENTANYL CITRATE (PF) 100 MCG/2ML IJ SOLN
50.0000 ug | Freq: Once | INTRAMUSCULAR | Status: AC
  Administered 2020-07-25: 50 ug via INTRAVENOUS
  Filled 2020-07-25: qty 2

## 2020-07-25 MED ORDER — FENOFIBRATE 160 MG PO TABS
160.0000 mg | ORAL_TABLET | Freq: Every day | ORAL | Status: DC
  Administered 2020-07-26 – 2020-07-29 (×3): 160 mg via ORAL
  Filled 2020-07-25 (×4): qty 1

## 2020-07-25 MED ORDER — ONDANSETRON HCL 4 MG/2ML IJ SOLN: 4.0000 mg | Freq: Four times a day (QID) | INTRAMUSCULAR | Status: DC | PRN

## 2020-07-25 MED ORDER — MORPHINE SULFATE (PF) 4 MG/ML IV SOLN
4.0000 mg | INTRAVENOUS | Status: DC | PRN
  Administered 2020-07-26 – 2020-07-27 (×6): 4 mg via INTRAVENOUS
  Filled 2020-07-25 (×6): qty 1

## 2020-07-25 MED ORDER — ALPRAZOLAM 0.25 MG PO TABS
0.2500 mg | ORAL_TABLET | Freq: Two times a day (BID) | ORAL | Status: DC | PRN
  Administered 2020-07-25: 0.25 mg via ORAL
  Filled 2020-07-25 (×2): qty 1

## 2020-07-25 MED ORDER — HEPARIN (PORCINE) 25000 UT/250ML-% IV SOLN
1600.0000 [IU]/h | INTRAVENOUS | Status: DC
  Administered 2020-07-25: 1200 [IU]/h via INTRAVENOUS
  Administered 2020-07-26: 1400 [IU]/h via INTRAVENOUS
  Administered 2020-07-27 (×2): 1600 [IU]/h via INTRAVENOUS
  Filled 2020-07-25 (×4): qty 250

## 2020-07-25 MED ORDER — MORPHINE SULFATE (PF) 2 MG/ML IV SOLN
2.0000 mg | Freq: Once | INTRAVENOUS | Status: DC
Start: 2020-07-25 — End: 2020-07-25

## 2020-07-25 MED ORDER — LEVOTHYROXINE SODIUM 50 MCG PO TABS
150.0000 ug | ORAL_TABLET | Freq: Every day | ORAL | Status: DC
  Administered 2020-07-26 – 2020-07-29 (×3): 150 ug via ORAL
  Filled 2020-07-25 (×3): qty 1

## 2020-07-25 MED ORDER — ASPIRIN 81 MG PO CHEW
324.0000 mg | CHEWABLE_TABLET | Freq: Once | ORAL | Status: AC
  Administered 2020-07-25: 324 mg via ORAL
  Filled 2020-07-25: qty 4

## 2020-07-25 MED ORDER — ROSUVASTATIN CALCIUM 10 MG PO TABS
10.0000 mg | ORAL_TABLET | Freq: Every day | ORAL | Status: DC
  Administered 2020-07-25 – 2020-07-28 (×4): 10 mg via ORAL
  Filled 2020-07-25: qty 1
  Filled 2020-07-25: qty 2
  Filled 2020-07-25 (×3): qty 1

## 2020-07-25 MED ORDER — HEPARIN BOLUS VIA INFUSION
4000.0000 [IU] | Freq: Once | INTRAVENOUS | Status: AC
  Administered 2020-07-25: 4000 [IU] via INTRAVENOUS
  Filled 2020-07-25: qty 4000

## 2020-07-25 MED ORDER — IRBESARTAN 150 MG PO TABS
150.0000 mg | ORAL_TABLET | Freq: Every day | ORAL | Status: DC
  Administered 2020-07-26 – 2020-07-29 (×3): 150 mg via ORAL
  Filled 2020-07-25 (×5): qty 1

## 2020-07-25 MED ORDER — ESCITALOPRAM OXALATE 10 MG PO TABS
10.0000 mg | ORAL_TABLET | Freq: Every day | ORAL | Status: DC
  Administered 2020-07-26 – 2020-07-28 (×3): 10 mg via ORAL
  Filled 2020-07-25 (×5): qty 1

## 2020-07-25 MED ORDER — ASPIRIN EC 81 MG PO TBEC
81.0000 mg | DELAYED_RELEASE_TABLET | Freq: Every day | ORAL | Status: DC
  Administered 2020-07-26 – 2020-07-29 (×3): 81 mg via ORAL
  Filled 2020-07-25 (×4): qty 1

## 2020-07-25 MED ORDER — CARVEDILOL 3.125 MG PO TABS
3.1250 mg | ORAL_TABLET | Freq: Two times a day (BID) | ORAL | Status: DC
  Filled 2020-07-25 (×2): qty 1

## 2020-07-25 MED ORDER — FENTANYL CITRATE (PF) 100 MCG/2ML IJ SOLN
50.0000 ug | Freq: Once | INTRAMUSCULAR | Status: AC
Start: 2020-07-25 — End: 2020-07-25
  Administered 2020-07-25: 50 ug via INTRAVENOUS
  Filled 2020-07-25: qty 2

## 2020-07-25 NOTE — ED Provider Notes (Signed)
[facility] Emergency Department Provider Note   ____________________________________________   Event Date/Time   First MD Initiated Contact with Patient 07/25/20 1707     (approximate)  I have reviewed the triage vital signs and the nursing notes.   HISTORY  Chief Complaint Chest Pain    HPI A 55 year old patient with a history of treated diabetes, hypertension, hypercholesterolemia and obesity presents for evaluation of chest pain. Initial onset of pain was approximately 1-3 hours ago. The patient's chest pain is described as heaviness/pressure/tightness and is worse with exertion. The patient's chest pain is middle- or left-sided, is not well-localized, is not sharp and does radiate to the arms/jaw/neck. The patient does not complain of nausea and denies diaphoresis. The patient has no history of stroke, has no history of peripheral artery disease, has not smoked in the past 90 days and has no relevant family history of coronary artery disease (first degree relative at less than age 60).   History of previous cardiac bypass and stent.  Follows with Dr. Martinique in [place]  Pain currently mild.  Past Medical History:  Diagnosis Date  . Acid reflux   . Allergic asthma    rare flare up, worse with alllergies as of 12/2016  . Coronary artery disease   . DVT (deep venous thrombosis) ([place]) 01/2001   after CABG  . Dyslipidemia   . Hyperlipidemia   . Hypertension   . Hypothyroidism   . Impaired fasting blood sugar   . MI (myocardial infarction) ([place]) july of 2000   stent  . Palpitations    hx of  . Thyroid disease    hypothyroidism  . Tobacco use    vape, no cigarrette since 04/2015    Patient Active Problem List   Diagnosis Date Noted  . Encounter for health maintenance examination in adult 03/14/2020  . Controlled type 2 diabetes mellitus with complication, without long-term current use of insulin ([place]) 12/27/2019  . Need for shingles vaccine  12/27/2019  . Scrotal pain 03/09/2019  . Scrotal swelling 03/09/2019  . Extrinsic asthma 03/09/2019  . Fatigue 08/11/2018  . Encounter for screening colonoscopy   . Allergic rhinitis 01/16/2018  . Insomnia 01/16/2018  . Vaccine counseling 01/14/2017  . Need for influenza vaccination 01/14/2017  . History of MI (myocardial infarction) 12/18/2015  . Screening for prostate cancer 12/18/2015  . Impaired fasting blood sugar 12/18/2015  . Statin-induced myositis 12/18/2015  . Erectile dysfunction 11/18/2015  . Hypothyroidism (acquired) 11/18/2015  . Essential hypertension 11/18/2015  . BPH (benign prostatic hyperplasia) 11/18/2015  . S/P CABG (coronary artery bypass graft) 03/31/2011  . Coronary artery disease   . Dyslipidemia   . Acid reflux     Past Surgical History:  Procedure Laterality Date  . APPENDECTOMY  2001  . CARDIAC CATHETERIZATION  2000   stent  . COLONOSCOPY WITH PROPOFOL N/A 03/17/2018   Procedure: COLONOSCOPY WITH PROPOFOL;  Surgeon: Lucilla Lame, MD;  Location: [facility];  Service: Endoscopy;  Laterality: N/A;  . CORONARY ANGIOPLASTY  jan. 2001  . CORONARY ANGIOPLASTY WITH STENT PLACEMENT  june 2001   stent  . CORONARY ARTERY BYPASS GRAFT  dec. 2002   LIMA graft to LAD,RIMA graft to the intermediate branch and saphenous vein graft  to the posterior lateral and posterior descending branches of the right coronary  . POLYPECTOMY  03/17/2018   Procedure: POLYPECTOMY;  Surgeon: Lucilla Lame, MD;  Location: [facility];  Service: Endoscopy;;    Prior to Admission  medications   Medication Sig Start Date End Date Taking? Authorizing Provider  aspirin (ASPIRIN LOW DOSE) 81 MG EC tablet Take 1 tablet (81 mg total) by mouth daily. Swallow whole. 12/27/19  Yes Tysinger, Camelia Eng, PA-C  cetirizine (ZYRTEC) 10 MG tablet Take 10 mg by mouth daily.   Yes [provider]  escitalopram (LEXAPRO) 10 MG tablet Take 1 tablet (10 mg total) by mouth daily.  04/28/20  Yes Tysinger, Camelia Eng, PA-C  fenofibrate (TRICOR) 145 MG tablet TAKE 1 TABLET BY MOUTH  DAILY 02/25/20  Yes Tysinger, Camelia Eng, PA-C  irbesartan (AVAPRO) 150 MG tablet Take 150 mg by mouth daily.   Yes [provider]  levothyroxine (SYNTHROID) 150 MCG tablet Take 1 tablet (150 mcg total) by mouth daily before breakfast. 06/16/20  Yes Tysinger, Camelia Eng, PA-C  Multiple Vitamins-Minerals (MULTIVITAMIN ADULT) CHEW Chew 1 tablet by mouth daily.   Yes [provider]  PROAIR RESPICLICK 850 (90 Base) MCG/ACT AEPB USE 2 INHALATIONS BY MOUTH  EVERY 6 HOURS AS NEEDED 02/05/19  Yes Tysinger, Camelia Eng, PA-C  rosuvastatin (CRESTOR) 20 MG tablet TAKE 1 TABLET BY MOUTH AT  BEDTIME. MAY USE 1/2 TABLET IF NEEDED Patient taking differently: Take by mouth daily. 06/11/19  Yes Tysinger, Camelia Eng, PA-C  tadalafil (CIALIS) 5 MG tablet Take 1 tablet (5 mg total) by mouth daily. 03/18/20  Yes Tysinger, Camelia Eng, PA-C  metFORMIN (GLUCOPHAGE) 500 MG tablet Take 1 tablet (500 mg total) by mouth daily with breakfast. Patient not taking: Reported on 07/25/2020 12/28/19   Tysinger, Camelia Eng, PA-C  losartan (COZAAR) 100 MG tablet Take 1 tablet (100 mg total) by mouth daily. 04/18/15 07/11/15  Martinique, Peter M, MD  pantoprazole (PROTONIX) 40 MG tablet Take 1 tablet (40 mg total) by mouth daily. **PLEASE SCHEDULE FOLLOW UP APPT** Patient not taking: No sig reported 12/20/19 03/18/20  Lucilla Lame, MD    Allergies Wilder Glade [dapagliflozin], Statins, and Tetracycline  Family History  Problem Relation Age of Onset  . Liver cancer Mother   . Cancer Mother   . Heart disease Father        cabg , arrhythmia  . Hypertension Father   . Valvular heart disease Father   . Heart disease Sister        arrhythmia  . Thyroid disease Brother   . Diabetes Paternal Grandmother   . Cancer Paternal Grandfather        leukemia  . Cancer Brother 81       lung and brain, seizures  . Liver disease Brother   . Asthma Brother    . Suicidality Brother   . Multiple sclerosis Sister   . Prostate cancer Neg Hx   . Kidney cancer Neg Hx     Social History Social History   Tobacco Use  . Smoking status: Former Smoker    Packs/day: 0.50    Years: 10.00    Pack years: 5.00    Types: Cigarettes    Quit date: 04/08/2015    Years since quitting: 5.3  . Smokeless tobacco: Never Used  Vaping Use  . Vaping Use: Every day  . Start date: 04/08/2015  . Substances: Nicotine, Flavoring  . Devices:    Substance Use Topics  . Alcohol use: Yes    Alcohol/week: 5.0 standard drinks    Types: 5 Cans of beer per week    Comment: occ.  . Drug use: No    Review of Systems Constitutional: No fever/chills Eyes: No  visual changes. ENT: Pain does radiate towards his neck when present Cardiovascular: See HPI Respiratory: Denies shortness of breath. Gastrointestinal: No abdominal pain.   Genitourinary: Negative for dysuria. Musculoskeletal: Negative for back pain. Skin: Negative for rash. Neurological: Negative for headaches, areas of focal weakness or numbness.    ____________________________________________   PHYSICAL EXAM:  VITAL SIGNS: ED Triage Vitals  Enc Vitals Group     BP 07/25/20 1714 (!) 183/100     Pulse Rate 07/25/20 1714 61     Resp 07/25/20 1714 18     Temp 07/25/20 1714 98.5 F (36.9 C)     Temp src --      SpO2 07/25/20 1714 97 %     Weight 07/25/20 1720 215 lb (97.5 kg)     Height 07/25/20 1720 5\' 10"  (1.778 m)     Head Circumference --      Peak Flow --      Pain Score 07/25/20 1716 6     Pain Loc --      Pain Edu? --      Excl. in [place]? --     Constitutional: Alert and oriented. Well appearing and in no acute distress. Eyes: Conjunctivae are normal. Head: Atraumatic. Nose: No congestion/rhinnorhea. Mouth/Throat: Mucous membranes are moist. Neck: No stridor.  Cardiovascular: Normal rate, regular rhythm. Grossly normal heart sounds.  Good peripheral circulation. Respiratory: Normal  respiratory effort.  No retractions. Lungs CTAB. Gastrointestinal: Soft and nontender. No distention. Musculoskeletal: No lower extremity tenderness nor edema. Neurologic:  Normal speech and language. No gross focal neurologic deficits are appreciated.  Skin:  Skin is warm, dry and intact. No rash noted. Psychiatric: Mood and affect are normal. Speech and behavior are normal.  ____________________________________________   LABS (all labs ordered are listed, but only abnormal results are displayed)  Labs Reviewed  BASIC METABOLIC PANEL - Abnormal; Notable for the following components:      Result Value   Potassium 3.3 (*)    Glucose, Bld 151 (*)    Creatinine, Ser 1.26 (*)    All other components within normal limits  TROPONIN I (HIGH SENSITIVITY) - Abnormal; Notable for the following components:   Troponin I (High Sensitivity) 168 (*)    All other components within normal limits  SARS CORONAVIRUS 2 (TAT 6-24 HRS)  CBC  APTT  PROTIME-INR  CBC  HEPARIN LEVEL (UNFRACTIONATED)  TROPONIN I (HIGH SENSITIVITY)   ____________________________________________  EKG  Reviewed inter by me at 1700 Heart rate 70 QRS 99 QTc 440 Normal sinus rhythm, T wave abnormalities including depression in 1 to Mild elevation present in aVR and V1.  Compared with prior changes in 1 2 and aVR appear to be new, I do not see evidence of STEMI.  EKG is concerning for ischemic changes compared with previous  Repeat EKG is reviewed at 1920 Heart rate 50 QRS 90 QTc 400 Normal sinus rhythm, again seen mild ST abnormality in V1 slight elevation, perhaps slightly less in aVR, mild ST segment depression 1 to.  No evidence of STEMI on repeat ____________________________________________  RADIOLOGY  DG Chest Port 1 View  Result Date: 07/25/2020 CLINICAL DATA:  Chest pain on exertion EXAM: PORTABLE CHEST 1 VIEW COMPARISON:  Radiograph 06/19/2005 FINDINGS: Postsurgical changes related to prior CABG including  intact and aligned sternotomy wires and multiple surgical clips projecting over the mediastinum. The aorta is calcified. The remaining cardiomediastinal contours are unremarkable. No consolidation, features of edema, pneumothorax, or effusion. Pulmonary vascularity is normally distributed.  No acute osseous or soft tissue abnormality. Telemetry leads overlie the chest. IMPRESSION: No acute cardiopulmonary abnormality. Prior sternotomy and CABG. Electronically Signed   By: Lovena Le M.D.   On: 07/25/2020 17:38    Chest x-ray reviewed negative for acute finding. ____________________________________________   PROCEDURES  Procedure(s) performed: None  Procedures  Critical Care performed: Yes, see critical care note(s)   CRITICAL CARE Performed by: Delman Kitten   Total critical care time: 30 minutes  Critical care time was exclusive of separately billable procedures and treating other patients.  Critical care was necessary to treat or prevent imminent or life-threatening deterioration.  Critical care was time spent personally by me on the following activities: development of treatment plan with patient and/or surrogate as well as nursing, discussions with consultants, evaluation of patient's response to treatment, examination of patient, obtaining history from patient or surrogate, ordering and performing treatments and interventions, ordering and review of laboratory studies, ordering and review of radiographic studies, pulse oximetry and re-evaluation of patient's condition.  ____________________________________________   INITIAL IMPRESSION / ASSESSMENT AND PLAN / ED COURSE  Pertinent labs & imaging results that were available during my care of the patient were reviewed by me and considered in my medical decision making (see chart for details).   Differential diagnosis includes, but is not limited to, ACS, aortic dissection, pulmonary embolism, cardiac tamponade, pneumothorax,  pneumonia, pericarditis, myocarditis, GI-related causes including esophagitis/gastritis, and musculoskeletal chest wall pain.    No ripping tearing or moving pain.  No associated upper abdominal or GI symptoms.  Reassuring clinical exam clear lungs at this time.  Based on description of pain and symptoms I am very concerned about potential for ACS here.  Await first troponin at this time.  No nitrates as patient has taken Cialis today.  HEAR Score: 6    ----------------------------------------- 7:24 PM on 07/25/2020 -----------------------------------------  Patient troponin critically elevated.  Repeat EKG does not show progression of ischemia or conversion to STEMI.  Patient resting at this time with a very minimal if any chest discomfort he reports just a very slight sense of some discomfort in his upper chest, improved.  Discussed case care reviewed EKGs with Dr. Stanford Breed of cardiology.  He advises aspirin, heparin, and admission to the hospital.  Avoid nitrates given use of Cialis  Paged hospitalist for admission.  Patient and his family at the bedside understanding agreeable with plan for admission.    Admission discussed and accepted by Dr. Judd Gaudier ____________________________________________   FINAL CLINICAL IMPRESSION(S) / ED DIAGNOSES  Final diagnoses:  Unstable angina ([place])  Chest pain with high risk for cardiac etiology        Note:  This document was prepared using Dragon voice recognition software and may include unintentional dictation errors       Delman Kitten, MD 07/25/20 1935

## 2020-07-25 NOTE — Progress Notes (Signed)
[place] for heparin infusion Indication: chest pain/ACS  Allergies  Allergen Reactions  . Farxiga [Dapagliflozin]     weak  . Statins     Myalgias other than low dose  . Tetracycline     REACTION: Nervous,shakey    Patient Measurements: Height: 5\' 10"  (177.8 cm) Weight: 97.5 kg (215 lb) IBW/kg (Calculated) : 73 Heparin Dosing Weight: 93.1 kg  Vital Signs: Temp: 98.5 F (36.9 C) (05/27 1714) BP: 152/89 (05/27 1800) Pulse Rate: 56 (05/27 1800)  Labs: Recent Labs    07/25/20 1739  HGB 14.8  HCT 42.6  PLT 200  APTT 26  LABPROT 13.0  INR 1.0  CREATININE 1.26*  TROPONINIHS 168*    Estimated Creatinine Clearance: 76.7 mL/min (A) (by C-G formula based on SCr of 1.26 mg/dL (H)).   Medical History: Past Medical History:  Diagnosis Date  . Acid reflux   . Allergic asthma    rare flare up, worse with alllergies as of 12/2016  . Coronary artery disease   . DVT (deep venous thrombosis) ([place]) 01/2001   after CABG  . Dyslipidemia   . Hyperlipidemia   . Hypertension   . Hypothyroidism   . Impaired fasting blood sugar   . MI (myocardial infarction) ([place]) july of 2000   stent  . Palpitations    hx of  . Thyroid disease    hypothyroidism  . Tobacco use    vape, no cigarrette since 04/2015    Medications:  Per chart review and pharmacy technician confirmed with pt he is not on any anticoagulation  Assessment: 55 year old patient with a history of diabetes, hypertension, hypercholesterolemia and obesity presents for evaluation of chest pain. Pt has history of stent, CABG. Pharmacy has been consulted for heparin dosing and monitoring for ACS/STEMI.  HS trop 168. Baseline CBC, PT, INR, and aPTT WNL.   Goal of Therapy:  Heparin level 0.3-0.7 units/ml Monitor platelets by anticoagulation protocol: Yes   Plan:   Give 4000 units bolus x 1  Start heparin infusion at 1200 units/hr  Check anti-Xa level in 6 hours and daily  while on heparin  Continue to monitor H&H and platelets  Sherilyn Banker, PharmD Pharmacy Resident  07/25/2020 7:00 PM

## 2020-07-25 NOTE — ED Triage Notes (Signed)
Pt to ER reports CP with exertion for last 2 days.  Pt states pain started as intermittent and then became more constant.  Pt has hx of stent, CABG.

## 2020-07-25 NOTE — H&P (Signed)
History and Physical    Todd Kelley OHY:[id number] DOB: 11-19-1965 DOA: 07/25/2020  PCP: Carlena Hurl, PA-C   Patient coming from: Home  I have personally briefly reviewed patient's old medical records in [place]  Chief Complaint: Chest pain  HPI: Todd Kelley is a 55 y.o. male with medical history significant for CAD with hx of prior stents and CABGx3 2002, with last cardiac evaluation with nuclear stress test 2015 with a fixed defect, DM, HTN, HLDhypothyroidism, on daily cialis for   erectile dysfunction who presents to the ED with a 1-week history of chest pain on exertion resolved with rest now becoming more constant and frequent  Pain was tightness and substernal area nonradiating and severe intensity, radiating to jaw, without associated nausea, vomiting or diaphoresis.  He denies cough, fever or chills, shortness of breath, lower extremity pain or swelling ED course: On arrival, afebrile, BP 183/100, pulse 61 respirations 18 with O2 sat 97% on room air.  First troponin 168.  Mild creatinine elevation of 1.26, potassium 3.3.  Otherwise blood work including CBC and BMP unremarkable EKG, personally reviewed and interpreted: NSR at 70 with subtle ST elevation in V1 and depression due to L aVF Imaging: Chest x-ray with no acute abnormality  The ED provider spoke with on-call cardiologist, Dr. Stanford Breed who agreed with admission for heparin infusion and further work-up.  Hospitalist consulted for admission.  Review of Systems: As per HPI otherwise all other systems on review of systems negative.    Past Medical History:  Diagnosis Date  . Acid reflux   . Allergic asthma    rare flare up, worse with alllergies as of 12/2016  . Coronary artery disease   . DVT (deep venous thrombosis) ([facility]) 01/2001   after CABG  . Dyslipidemia   . Hyperlipidemia   . Hypertension   . Hypothyroidism   . Impaired fasting blood sugar   . MI (myocardial infarction) ([facility]) july of 2000   stent   . Palpitations    hx of  . Thyroid disease    hypothyroidism  . Tobacco use    vape, no cigarrette since 04/2015    Past Surgical History:  Procedure Laterality Date  . APPENDECTOMY  2001  . CARDIAC CATHETERIZATION  2000   stent  . COLONOSCOPY WITH PROPOFOL N/A 03/17/2018   Procedure: COLONOSCOPY WITH PROPOFOL;  Surgeon: Lucilla Lame, MD;  Location: [facility];  Service: Endoscopy;  Laterality: N/A;  . CORONARY ANGIOPLASTY  jan. 2001  . CORONARY ANGIOPLASTY WITH STENT PLACEMENT  june 2001   stent  . CORONARY ARTERY BYPASS GRAFT  dec. 2002   LIMA graft to LAD,RIMA graft to the intermediate branch and saphenous vein graft  to the posterior lateral and posterior descending branches of the right coronary  . POLYPECTOMY  03/17/2018   Procedure: POLYPECTOMY;  Surgeon: Lucilla Lame, MD;  Location: [facility];  Service: Endoscopy;;     reports that he quit smoking about 5 years ago. His smoking use included cigarettes. He has a 5.00 pack-year smoking history. He has never used smokeless tobacco. He reports current alcohol use of about 5.0 standard drinks of alcohol per week. He reports that he does not use drugs.  Allergies  Allergen Reactions  . Farxiga [Dapagliflozin]     weak  . Statins     Myalgias other than low dose  . Tetracycline     REACTION: Nervous,shakey    Family History  Problem Relation Age of  Onset  . Liver cancer Mother   . Cancer Mother   . Heart disease Father        cabg , arrhythmia  . Hypertension Father   . Valvular heart disease Father   . Heart disease Sister        arrhythmia  . Thyroid disease Brother   . Diabetes Paternal Grandmother   . Cancer Paternal Grandfather        leukemia  . Cancer Brother 49       lung and brain, seizures  . Liver disease Brother   . Asthma Brother   . Suicidality Brother   . Multiple sclerosis Sister   . Prostate cancer Neg Hx   . Kidney cancer Neg Hx       Prior to Admission medications    Medication Sig Start Date End Date Taking? Authorizing Provider  aspirin (ASPIRIN LOW DOSE) 81 MG EC tablet Take 1 tablet (81 mg total) by mouth daily. Swallow whole. 12/27/19  Yes Tysinger, Camelia Eng, PA-C  cetirizine (ZYRTEC) 10 MG tablet Take 10 mg by mouth daily.   Yes [provider]  escitalopram (LEXAPRO) 10 MG tablet Take 1 tablet (10 mg total) by mouth daily. 04/28/20  Yes Tysinger, Camelia Eng, PA-C  fenofibrate (TRICOR) 145 MG tablet TAKE 1 TABLET BY MOUTH  DAILY 02/25/20  Yes Tysinger, Camelia Eng, PA-C  irbesartan (AVAPRO) 150 MG tablet Take 150 mg by mouth daily.   Yes [provider]  levothyroxine (SYNTHROID) 150 MCG tablet Take 1 tablet (150 mcg total) by mouth daily before breakfast. 06/16/20  Yes Tysinger, Camelia Eng, PA-C  Multiple Vitamins-Minerals (MULTIVITAMIN ADULT) CHEW Chew 1 tablet by mouth daily.   Yes [provider]  PROAIR RESPICLICK 244 (90 Base) MCG/ACT AEPB USE 2 INHALATIONS BY MOUTH  EVERY 6 HOURS AS NEEDED 02/05/19  Yes Tysinger, Camelia Eng, PA-C  rosuvastatin (CRESTOR) 20 MG tablet TAKE 1 TABLET BY MOUTH AT  BEDTIME. MAY USE 1/2 TABLET IF NEEDED Patient taking differently: Take by mouth daily. 06/11/19  Yes Tysinger, Camelia Eng, PA-C  tadalafil (CIALIS) 5 MG tablet Take 1 tablet (5 mg total) by mouth daily. 03/18/20  Yes Tysinger, Camelia Eng, PA-C  metFORMIN (GLUCOPHAGE) 500 MG tablet Take 1 tablet (500 mg total) by mouth daily with breakfast. Patient not taking: Reported on 07/25/2020 12/28/19   Tysinger, Camelia Eng, PA-C  losartan (COZAAR) 100 MG tablet Take 1 tablet (100 mg total) by mouth daily. 04/18/15 07/11/15  Martinique, Peter M, MD  pantoprazole (PROTONIX) 40 MG tablet Take 1 tablet (40 mg total) by mouth daily. **PLEASE SCHEDULE FOLLOW UP APPT** Patient not taking: No sig reported 12/20/19 03/18/20  Lucilla Lame, MD    Physical Exam: Vitals:   07/25/20 1714 07/25/20 1720 07/25/20 1745 07/25/20 1800  BP: (!) 183/100  (!) 170/91 (!) 152/89  Pulse: 61  70  (!) 56  Resp: 18  12 16   Temp: 98.5 F (36.9 C)     SpO2: 97%  92% 93%  Weight:  97.5 kg    Height:  5\' 10"  (1.778 m)       Vitals:   07/25/20 1714 07/25/20 1720 07/25/20 1745 07/25/20 1800  BP: (!) 183/100  (!) 170/91 (!) 152/89  Pulse: 61  70 (!) 56  Resp: 18  12 16   Temp: 98.5 F (36.9 C)     SpO2: 97%  92% 93%  Weight:  97.5 kg    Height:  5\' 10"  (1.778 m)  Constitutional: Alert and oriented x 3 . Not in any apparent distress HEENT:      Head: Normocephalic and atraumatic.         Eyes: PERLA, EOMI, Conjunctivae are normal. Sclera is non-icteric.       Mouth/Throat: Mucous membranes are moist.       Neck: Supple with no signs of meningismus. Cardiovascular: Regular rate and rhythm. No murmurs, gallops, or rubs. 2+ symmetrical distal pulses are present . No JVD. No LE edema Respiratory: Respiratory effort normal .Lungs sounds clear bilaterally. No wheezes, crackles, or rhonchi.  Gastrointestinal: Soft, non tender, and non distended with positive bowel sounds.  Genitourinary: No CVA tenderness. Musculoskeletal: Nontender with normal range of motion in all extremities. No cyanosis, or erythema of extremities. Neurologic:  Face is symmetric. Moving all extremities. No gross focal neurologic deficits . Skin: Skin is warm, dry.  No rash or ulcers Psychiatric: Mood and affect are normal    Labs on Admission: I have personally reviewed following labs and imaging studies  CBC: Recent Labs  Lab 07/25/20 1739  WBC 10.1  HGB 14.8  HCT 42.6  MCV 84.7  PLT 170   Basic Metabolic Panel: Recent Labs  Lab 07/25/20 1739  NA 138  K 3.3*  CL 105  CO2 23  GLUCOSE 151*  BUN 13  CREATININE 1.26*  CALCIUM 9.9   GFR: Estimated Creatinine Clearance: 76.7 mL/min (A) (by C-G formula based on SCr of 1.26 mg/dL (H)). Liver Function Tests: No results for input(s): AST, ALT, ALKPHOS, BILITOT, PROT, ALBUMIN in the last 168 hours. No results for input(s): LIPASE, AMYLASE  in the last 168 hours. No results for input(s): AMMONIA in the last 168 hours. Coagulation Profile: Recent Labs  Lab 07/25/20 1739  INR 1.0   Cardiac Enzymes: No results for input(s): CKTOTAL, CKMB, CKMBINDEX, TROPONINI in the last 168 hours. BNP (last 3 results) No results for input(s): PROBNP in the last 8760 hours. HbA1C: No results for input(s): HGBA1C in the last 72 hours. CBG: No results for input(s): GLUCAP in the last 168 hours. Lipid Profile: No results for input(s): CHOL, HDL, LDLCALC, TRIG, CHOLHDL, LDLDIRECT in the last 72 hours. Thyroid Function Tests: No results for input(s): TSH, T4TOTAL, FREET4, T3FREE, THYROIDAB in the last 72 hours. Anemia Panel: No results for input(s): VITAMINB12, FOLATE, FERRITIN, TIBC, IRON, RETICCTPCT in the last 72 hours. Urine analysis:    Component Value Date/Time   COLORURINE YELLOW (A) 03/29/2018 1055   APPEARANCEUR CLEAR (A) 03/29/2018 1055   LABSPEC 1.015 03/14/2020 1529   PHURINE 6.0 03/29/2018 1055   GLUCOSEU NEGATIVE 03/29/2018 1055   HGBUR NEGATIVE 03/29/2018 1055   BILIRUBINUR negative 03/14/2020 1529   KETONESUR negative 03/14/2020 1529   KETONESUR NEGATIVE 03/29/2018 1055   PROTEINUR negative 03/14/2020 1529   PROTEINUR NEGATIVE 03/29/2018 1055   NITRITE Negative 03/14/2020 1529   NITRITE NEGATIVE 03/29/2018 1055   LEUKOCYTESUR Negative 03/14/2020 1529    Radiological Exams on Admission: DG Chest Port 1 View  Result Date: 07/25/2020 CLINICAL DATA:  Chest pain on exertion EXAM: PORTABLE CHEST 1 VIEW COMPARISON:  Radiograph 06/19/2005 FINDINGS: Postsurgical changes related to prior CABG including intact and aligned sternotomy wires and multiple surgical clips projecting over the mediastinum. The aorta is calcified. The remaining cardiomediastinal contours are unremarkable. No consolidation, features of edema, pneumothorax, or effusion. Pulmonary vascularity is normally distributed. No acute osseous or soft tissue  abnormality. Telemetry leads overlie the chest. IMPRESSION: No acute cardiopulmonary abnormality. Prior sternotomy and CABG.  Electronically Signed   By: Lovena Le M.D.   On: 07/25/2020 17:38     Assessment/Plan 55 year old male with history of CAD, prior stents and CABGx3 2002, with last cardiac evaluation with nuclear stress test 2015 with a fixed defect, DM, HTN, HLD, hypothyroidism, on daily cialis for   erectile dysfunction presenting with typical chest pain.    NSTEMI /unstable angina   CAD S/P CABG 2002 - Patient presenting with 2 days of typical chest pain troponin 168, EKG with subtle ST-T wave changes, reviewed by cardiology at admission  - Continue to trend troponin - Continue heparin infusion - Avoiding nitrates due to daily Cialis use and risk of severe hypotension - Aspirin, Coreg, and continue home Crestor -Morphine for pain control -cardiology consult  Mild hypokalemia - Potassium 3.3 - One-time oral potassium  Erectile dysfunction -On daily low-dose Cialis peripheral be held  Essential hypertension - Continue home irbesartan  HLD - Continue Crestor  Hypothyroidism - Continue Synthroid    Controlled type 2 diabetes mellitus -Sliding scale insulin coverage    DVT prophylaxis: Heparin infusion Code Status: full code  Family Communication:  none  Disposition Plan: Back to previous home environment Consults called: Cardiology Status:At the time of admission, it appears that the appropriate admission status for this patient is INPATIENT. This is judged to be reasonable and necessary in order to provide the required intensity of service to ensure the patient's safety given the presenting symptoms, physical exam findings, and initial radiographic and laboratory data in the context of their  Comorbid conditions.   Patient requires inpatient status due to high intensity of service, high risk for further deterioration and high frequency of surveillance required.    I certify that at the point of admission it is my clinical judgment that the patient will require inpatient hospital care spanning beyond [place] MD [facility]     07/25/2020, 7:56 PM

## 2020-07-26 ENCOUNTER — Encounter: Payer: Self-pay | Admitting: Internal Medicine

## 2020-07-26 DIAGNOSIS — I200 Unstable angina: Secondary | ICD-10-CM

## 2020-07-26 DIAGNOSIS — I10 Essential (primary) hypertension: Secondary | ICD-10-CM

## 2020-07-26 DIAGNOSIS — E118 Type 2 diabetes mellitus with unspecified complications: Secondary | ICD-10-CM | POA: Diagnosis not present

## 2020-07-26 DIAGNOSIS — E782 Mixed hyperlipidemia: Secondary | ICD-10-CM

## 2020-07-26 DIAGNOSIS — Z951 Presence of aortocoronary bypass graft: Secondary | ICD-10-CM

## 2020-07-26 DIAGNOSIS — I214 Non-ST elevation (NSTEMI) myocardial infarction: Secondary | ICD-10-CM | POA: Diagnosis not present

## 2020-07-26 LAB — GLUCOSE, CAPILLARY
Glucose-Capillary: 158 mg/dL — ABNORMAL HIGH (ref 70–99)
Glucose-Capillary: 109 mg/dL — ABNORMAL HIGH (ref 70–99)
Glucose-Capillary: 156 mg/dL — ABNORMAL HIGH (ref 70–99)
Glucose-Capillary: 170 mg/dL — ABNORMAL HIGH (ref 70–99)

## 2020-07-26 LAB — CBC
HCT: 42.7 % (ref 39.0–52.0)
Hemoglobin: 14.7 g/dL (ref 13.0–17.0)
MCH: 29.5 pg (ref 26.0–34.0)
MCHC: 34.4 g/dL (ref 30.0–36.0)
MCV: 85.7 fL (ref 80.0–100.0)
Platelets: 178 10*3/uL (ref 150–400)
RBC: 4.98 MIL/uL (ref 4.22–5.81)
RDW: 12.6 % (ref 11.5–15.5)
WBC: 8 10*3/uL (ref 4.0–10.5)
nRBC: 0 % (ref 0.0–0.2)

## 2020-07-26 LAB — LIPID PANEL
Cholesterol: 207 mg/dL — ABNORMAL HIGH (ref 0–200)
HDL: 41 mg/dL (ref >40)
LDL Cholesterol: 138 mg/dL — ABNORMAL HIGH (ref 0–99)
Total CHOL/HDL Ratio: 5 RATIO
Triglycerides: 141 mg/dL (ref <150)
VLDL: 28 mg/dL (ref 0–40)

## 2020-07-26 LAB — BASIC METABOLIC PANEL
Anion gap: 8 (ref 5–15)
BUN: 12 mg/dL (ref 6–20)
CO2: 26 mmol/L (ref 22–32)
Calcium: 9.2 mg/dL (ref 8.9–10.3)
Chloride: 106 mmol/L (ref 98–111)
Creatinine, Ser: 1.19 mg/dL (ref 0.61–1.24)
GFR, Estimated: >60 mL/min (ref >60)
Glucose, Bld: 204 mg/dL — ABNORMAL HIGH (ref 70–99)
Potassium: 3.3 mmol/L — ABNORMAL LOW (ref 3.5–5.1)
Sodium: 140 mmol/L (ref 135–145)

## 2020-07-26 LAB — HEPARIN LEVEL (UNFRACTIONATED)
Heparin Unfractionated: 0.22 IU/mL — ABNORMAL LOW (ref 0.30–0.70)
Heparin Unfractionated: 0.28 IU/mL — ABNORMAL LOW (ref 0.30–0.70)
Heparin Unfractionated: 0.35 IU/mL (ref 0.30–0.70)

## 2020-07-26 LAB — SARS CORONAVIRUS 2 (TAT 6-24 HRS): SARS Coronavirus 2: NEGATIVE

## 2020-07-26 LAB — HIV ANTIBODY (ROUTINE TESTING W REFLEX): HIV Screen 4th Generation wRfx: NONREACTIVE

## 2020-07-26 LAB — TROPONIN I (HIGH SENSITIVITY): Troponin I (High Sensitivity): 19627 ng/L (ref <18)

## 2020-07-26 LAB — MAGNESIUM: Magnesium: 2 mg/dL (ref 1.7–2.4)

## 2020-07-26 MED ORDER — HEPARIN BOLUS VIA INFUSION
1400.0000 [IU] | Freq: Once | INTRAVENOUS | Status: AC
  Administered 2020-07-26: 1400 [IU] via INTRAVENOUS
  Filled 2020-07-26: qty 1400

## 2020-07-26 MED ORDER — POTASSIUM CHLORIDE CRYS ER 20 MEQ PO TBCR
60.0000 meq | EXTENDED_RELEASE_TABLET | Freq: Once | ORAL | Status: AC
  Administered 2020-07-26: 60 meq via ORAL
  Filled 2020-07-26: qty 3

## 2020-07-26 MED ORDER — POLYETHYLENE GLYCOL 3350 17 G PO PACK
17.0000 g | PACK | Freq: Every day | ORAL | Status: DC
  Filled 2020-07-26 (×3): qty 1

## 2020-07-26 MED ORDER — EZETIMIBE 10 MG PO TABS
10.0000 mg | ORAL_TABLET | Freq: Every day | ORAL | Status: DC
  Administered 2020-07-26 – 2020-07-29 (×3): 10 mg via ORAL
  Filled 2020-07-26 (×4): qty 1

## 2020-07-26 MED ORDER — SODIUM CHLORIDE 0.9% FLUSH
3.0000 mL | Freq: Two times a day (BID) | INTRAVENOUS | Status: DC
  Administered 2020-07-26 – 2020-07-29 (×5): 3 mL via INTRAVENOUS

## 2020-07-26 NOTE — Progress Notes (Signed)
PROGRESS NOTE    Todd Kelley  OVZ:[id number] DOB: [birth year] DOA: 07/25/2020 PCP: Carlena Hurl, PA-C  Outpatient Specialists: previous [facility] patient    Brief Narrative:   Todd Kelley is a 55 y.o. male with medical history significant for CAD with hx of prior stents and CABGx3 2002, with last cardiac evaluation with nuclear stress test 2015 with a fixed defect, DM, HTN, HLDhypothyroidism, on daily cialis for   erectile dysfunction who presents to the ED with a 1-week history of chest pain on exertion resolved with rest now becoming more constant and frequent  Pain was tightness and substernal area nonradiating and severe intensity, radiating to jaw, without associated nausea, vomiting or diaphoresis.  He denies cough, fever or chills, shortness of breath, lower extremity pain or swelling ED course: On arrival, afebrile, BP 183/100, pulse 61 respirations 18 with O2 sat 97% on room air.  First troponin 168.  Mild creatinine elevation of 1.26, potassium 3.3.  Otherwise blood work including CBC and BMP unremarkable   Assessment & Plan:   Principal Problem:   NSTEMI (non-ST elevated myocardial infarction) ([facility]) Active Problems:   Coronary artery disease   S/P CABG (coronary artery bypass graft)   Essential hypertension   BPH (benign prostatic hyperplasia)   Controlled type 2 diabetes mellitus with complication, without long-term current use of insulin ([place])   #  NSTEMI /unstable angina   CAD S/P CABG 2002 - Patient presenting with 2 days of typical chest pain troponin 168, EKG with subtle ST-T wave changes, reviewed by cardiology at admission  - Continue heparin infusion - Avoiding nitrates due to daily Cialis use and risk of severe hypotension - Aspirin, Coreg, and continue home Crestor -Morphine for pain control -cardiology consulted, recs pending  Mild hypokalemia - Potassium 3.3 - repleted - replete again, f/u mg  Erectile dysfunction -On daily low-dose  Cialis peripheral be held  Essential hypertension - Continue home irbesartan  HLD - Continue Crestor, says higher dose cause joint pains  Hypothyroidism - Continue Synthroid   # t2dm Pt says he is prediabetic but has mult a1cs in diabetic range, will need education -Sliding scale insulin coverage   DVT prophylaxis: heparin Code Status: full Family Communication: wife updated @ bedside 5/28  Level of care: Progressive Cardiac Status is: Inpatient  Remains inpatient appropriate because:Inpatient level of care appropriate due to severity of illness   Dispo: The patient is from: Home              Anticipated d/c is to: Home              Patient currently is not medically stable to d/c.   Difficult to place patient No        Consultants:  cardiology  Procedures: none  Antimicrobials:  none    Subjective: This morning mild chest pressure. No dyspnea. Has appetite  Objective: Vitals:   07/26/20 0246 07/26/20 0447 07/26/20 0827 07/26/20 0830  BP: (!) 161/89 (!) 144/76 131/84   Pulse: (!) 49 (!) 50 (!) 48   Resp: 14 14 19    Temp: (!) 97.5 F (36.4 C) 97.7 F (36.5 C)  98.1 F (36.7 C)  TempSrc:    Oral  SpO2: 94% 94% 95%   Weight:      Height:        Intake/Output Summary (Last 24 hours) at 07/26/2020 0854 Last data filed at 07/26/2020 0830 Gross per 24 hour  Intake --  Output 375 ml  Net -375 ml   Filed Weights   07/25/20 1720 07/25/20 2245  Weight: 97.5 kg 99.6 kg    Examination:  General exam: Appears calm and comfortable  Respiratory system: Clear to auscultation. Respiratory effort normal. Cardiovascular system: S1 & S2 heard, RRR. No JVD, murmurs, rubs, gallops or clicks. No pedal edema. Gastrointestinal system: Abdomen is nondistended, soft and nontender. No organomegaly or masses felt. Normal bowel sounds heard. Central nervous system: Alert and oriented. No focal neurological deficits. Extremities: Symmetric 5 x 5 power. Skin: No  rashes, lesions or ulcers Psychiatry: Judgement and insight appear normal. Mood & affect appropriate.     Data Reviewed: I have personally reviewed following labs and imaging studies  CBC: Recent Labs  Lab 07/25/20 1739 07/26/20 0143  WBC 10.1 8.0  HGB 14.8 14.7  HCT 42.6 42.7  MCV 84.7 85.7  PLT 200 761   Basic Metabolic Panel: Recent Labs  Lab 07/25/20 1739 07/26/20 0143  NA 138 140  K 3.3* 3.3*  CL 105 106  CO2 23 26  GLUCOSE 151* 204*  BUN 13 12  CREATININE 1.26* 1.19  CALCIUM 9.9 9.2   GFR: Estimated Creatinine Clearance: 82 mL/min (by C-G formula based on SCr of 1.19 mg/dL). Liver Function Tests: No results for input(s): AST, ALT, ALKPHOS, BILITOT, PROT, ALBUMIN in the last 168 hours. No results for input(s): LIPASE, AMYLASE in the last 168 hours. No results for input(s): AMMONIA in the last 168 hours. Coagulation Profile: Recent Labs  Lab 07/25/20 1739  INR 1.0   Cardiac Enzymes: No results for input(s): CKTOTAL, CKMB, CKMBINDEX, TROPONINI in the last 168 hours. BNP (last 3 results) No results for input(s): PROBNP in the last 8760 hours. HbA1C: No results for input(s): HGBA1C in the last 72 hours. CBG: Recent Labs  Lab 07/25/20 2254 07/26/20 0828  GLUCAP 143* 158*   Lipid Profile: Recent Labs    07/26/20 0143  CHOL 207*  HDL 41  LDLCALC 138*  TRIG 141  CHOLHDL 5.0   Thyroid Function Tests: No results for input(s): TSH, T4TOTAL, FREET4, T3FREE, THYROIDAB in the last 72 hours. Anemia Panel: No results for input(s): VITAMINB12, FOLATE, FERRITIN, TIBC, IRON, RETICCTPCT in the last 72 hours. Urine analysis:    Component Value Date/Time   COLORURINE YELLOW (A) 03/29/2018 1055   APPEARANCEUR CLEAR (A) 03/29/2018 1055   LABSPEC 1.015 03/14/2020 1529   PHURINE 6.0 03/29/2018 1055   GLUCOSEU NEGATIVE 03/29/2018 1055   HGBUR NEGATIVE 03/29/2018 1055   BILIRUBINUR negative 03/14/2020 1529   KETONESUR negative 03/14/2020 1529   KETONESUR  NEGATIVE 03/29/2018 1055   PROTEINUR negative 03/14/2020 1529   PROTEINUR NEGATIVE 03/29/2018 1055   NITRITE Negative 03/14/2020 1529   NITRITE NEGATIVE 03/29/2018 1055   LEUKOCYTESUR Negative 03/14/2020 1529   Sepsis Labs: @LABRCNTIP (procalcitonin:4,lacticidven:4)  ) Recent Results (from the past 240 hour(s))  SARS CORONAVIRUS 2 (TAT 6-24 HRS) Nasopharyngeal Nasopharyngeal Swab     Status: None   Collection Time: 07/25/20  5:45 PM   Specimen: Nasopharyngeal Swab  Result Value Ref Range Status   SARS Coronavirus 2 NEGATIVE NEGATIVE Final    Comment: (NOTE) SARS-CoV-2 target nucleic acids are NOT DETECTED.  The SARS-CoV-2 RNA is generally detectable in upper and lower respiratory specimens during the acute phase of infection. Negative results do not preclude SARS-CoV-2 infection, do not rule out co-infections with other pathogens, and should not be used as the sole basis for treatment or other patient management decisions. Negative results must be combined with  clinical observations, patient history, and epidemiological information. The expected result is Negative.  Fact Sheet for Patients: SugarRoll.be  Fact Sheet for Healthcare Providers: https://www.woods-mathews.com/  This test is not yet approved or cleared by the [place] FDA and  has been authorized for detection and/or diagnosis of SARS-CoV-2 by FDA under an Emergency Use Authorization (EUA). This EUA will remain  in effect (meaning this test can be used) for the duration of the COVID-19 declaration under Se ction 564(b)(1) of the Act, 21 U.S.C. section 360bbb-3(b)(1), unless the authorization is terminated or revoked sooner.  Performed at [facility], [place] [address], [place], [place] [address]          Radiology Studies: DG Chest Port 1 View  Result Date: 07/25/2020 CLINICAL DATA:  Chest pain on exertion EXAM: PORTABLE CHEST 1 VIEW COMPARISON:   Radiograph 06/19/2005 FINDINGS: Postsurgical changes related to prior CABG including intact and aligned sternotomy wires and multiple surgical clips projecting over the mediastinum. The aorta is calcified. The remaining cardiomediastinal contours are unremarkable. No consolidation, features of edema, pneumothorax, or effusion. Pulmonary vascularity is normally distributed. No acute osseous or soft tissue abnormality. Telemetry leads overlie the chest. IMPRESSION: No acute cardiopulmonary abnormality. Prior sternotomy and CABG. Electronically Signed   By: Lovena Le M.D.   On: 07/25/2020 17:38        Scheduled Meds: . aspirin EC  81 mg Oral Daily  . carvedilol  3.125 mg Oral BID WC  . escitalopram  10 mg Oral Daily  . fenofibrate  160 mg Oral Daily  . insulin aspart  0-15 Units Subcutaneous TID WC  . insulin aspart  0-5 Units Subcutaneous QHS  . irbesartan  150 mg Oral Daily  . levothyroxine  150 mcg Oral QAC breakfast  . rosuvastatin  10 mg Oral QHS   Continuous Infusions: . heparin 1,400 Units/hr (07/26/20 0319)     LOS: 1 day    Time spent: 45 min    Desma Maxim, MD [facility]   If 7PM-7AM, please contact night-coverage [email or web address] Password [facility] 07/26/2020, 8:54 AM

## 2020-07-26 NOTE — Progress Notes (Signed)
[place] for heparin infusion Indication: chest pain/ACS  Allergies  Allergen Reactions  . Farxiga [Dapagliflozin]     weak  . Statins     Myalgias other than low dose  . Tetracycline     REACTION: Nervous,shakey    Patient Measurements: Height: 5\' 10"  (177.8 cm) Weight: 99.6 kg (219 lb 9.6 oz) IBW/kg (Calculated) : 73 Heparin Dosing Weight: 93.1 kg  Vital Signs: Temp: 98.1 F (36.7 C) (05/28 0830) Temp Source: Oral (05/28 0830) BP: 131/84 (05/28 0827) Pulse Rate: 48 (05/28 0827)  Labs: Recent Labs    07/25/20 1739 07/25/20 1918 07/26/20 0143 07/26/20 0904  HGB 14.8  --  14.7  --   HCT 42.6  --  42.7  --   PLT 200  --  178  --   APTT 26  --   --   --   LABPROT 13.0  --   --   --   INR 1.0  --   --   --   HEPARINUNFRC  --   --  0.22* 0.28*  CREATININE 1.26*  --  1.19  --   TROPONINIHS 168* 609*  --   --     Estimated Creatinine Clearance: 82 mL/min (by C-G formula based on SCr of 1.19 mg/dL).   Medical History: Past Medical History:  Diagnosis Date  . Acid reflux   . Allergic asthma    rare flare up, worse with alllergies as of 12/2016  . Coronary artery disease    a. 08/1998 Lateral MI/PCI/BMS [place]; b. 2001 PTCA x 2 2/2 ISR in [place]; c. 2002 PCI/brachyrx 2/2 ISR in [place]; d. 01/2001 CABG x : LIMA->LAD, RIMA->RI, VG->RPL->RPDA; . e. 01/2014 MV: Low risk w/ attenuation artifact and mild inf basal isch. Nl EF->Med rx.  Marland Kitchen DVT (deep venous thrombosis) ([place])    a. 01/2001 after CABG.  . Erectile dysfunction   . Hyperlipidemia LDL goal <70   . Hypertension   . Hypothyroidism   . Palpitations   . Pericardial effusion    a. 01/2001 Effusion w/ tamponade req window following CABG.  . Tobacco use    vape, no cigarrette since 04/2015  . Type II diabetes mellitus ([facility])     Medications:  Per chart review and pharmacy technician confirmed with pt he is not on any anticoagulation  Assessment: 55 year old patient with a history of  diabetes, hypertension, hypercholesterolemia and obesity presents for evaluation of chest pain. Pt has history of stent, CABG. Pharmacy has been consulted for heparin dosing and monitoring for ACS/STEMI.  HS trop 168. Baseline CBC, PT, INR, and aPTT WNL.   5/28:  HL @ 0143 = 0.22  1400 units IV bolus, increase rate to 1400 units/hr.  5/28:  HL @ 0904= 0.28  1400 units IV bolus, increase drip rate to 1600 units/hr.   Goal of Therapy:  Heparin level 0.3-0.7 units/ml Monitor platelets by anticoagulation protocol: Yes   Plan:  5/28:  HL @ 0904= 0.28, subtherapeutic Will order heparin 1400 units IV X 1 bolus and increase drip rate to 1600 units/hr.  Will recheck HL 6 hrs after rate change.   Brigit Doke A, PharmD 07/26/2020 9:42 AM

## 2020-07-26 NOTE — Progress Notes (Signed)
[place] for heparin infusion Indication: chest pain/ACS  Allergies  Allergen Reactions  . Farxiga [Dapagliflozin]     weak  . Statins     Myalgias other than low dose  . Tetracycline     REACTION: Nervous,shakey    Patient Measurements: Height: 5\' 10"  (177.8 cm) Weight: 99.6 kg (219 lb 9.6 oz) IBW/kg (Calculated) : 73 Heparin Dosing Weight: 93.1 kg  Vital Signs: Temp: 97.5 F (36.4 C) (05/28 0246) Temp Source: Oral (05/27 2245) BP: 161/89 (05/28 0246) Pulse Rate: 49 (05/28 0246)  Labs: Recent Labs    07/25/20 1739 07/25/20 1918 07/26/20 0143  HGB 14.8  --  14.7  HCT 42.6  --  42.7  PLT 200  --  178  APTT 26  --   --   LABPROT 13.0  --   --   INR 1.0  --   --   HEPARINUNFRC  --   --  0.22*  CREATININE 1.26*  --  1.19  TROPONINIHS 168* 609*  --     Estimated Creatinine Clearance: 82 mL/min (by C-G formula based on SCr of 1.19 mg/dL).   Medical History: Past Medical History:  Diagnosis Date  . Acid reflux   . Allergic asthma    rare flare up, worse with alllergies as of 12/2016  . Coronary artery disease   . DVT (deep venous thrombosis) ([place]) 01/2001   after CABG  . Dyslipidemia   . Hyperlipidemia   . Hypertension   . Hypothyroidism   . Impaired fasting blood sugar   . MI (myocardial infarction) ([place]) july of 2000   stent  . Palpitations    hx of  . Thyroid disease    hypothyroidism  . Tobacco use    vape, no cigarrette since 04/2015    Medications:  Per chart review and pharmacy technician confirmed with pt he is not on any anticoagulation  Assessment: 55 year old patient with a history of diabetes, hypertension, hypercholesterolemia and obesity presents for evaluation of chest pain. Pt has history of stent, CABG. Pharmacy has been consulted for heparin dosing and monitoring for ACS/STEMI.  HS trop 168. Baseline CBC, PT, INR, and aPTT WNL.   Goal of Therapy:  Heparin level 0.3-0.7 units/ml Monitor  platelets by anticoagulation protocol: Yes   Plan:   5/28:  HL @ 0143 = 0.22 Will order heparin 1400 units IV X 1 bolus and increase drip rate to 1400 units/hr.  Will recheck HL 6 hrs after rate change.   Kip Cropp D, PharmD 07/26/2020 2:56 AM

## 2020-07-26 NOTE — Progress Notes (Signed)
Per Leroy Sea, NP, continue to monitor pain. As long is pain is under control, plan is to continue with PRN pain control, heparin drip and monitor until heart cath can be performed possibly Monday. Pts chest pain has been controlled pretty well today with pain reaching approx 5/10 x 2 and PRN 4mg  morphine administered as per orders each time. Pt is currently 0/10 pain at this time. Will continue to monitor.

## 2020-07-26 NOTE — Progress Notes (Signed)
[place] for heparin infusion Indication: chest pain/ACS  Allergies  Allergen Reactions  . Farxiga [Dapagliflozin]     weak  . Statins     Myalgias other than low dose  . Tetracycline     REACTION: Nervous,shakey    Patient Measurements: Height: 5\' 10"  (177.8 cm) Weight: 99.6 kg (219 lb 9.6 oz) IBW/kg (Calculated) : 73 Heparin Dosing Weight: 93.1 kg  Vital Signs: Temp: 98 F (36.7 C) (05/28 1447) Temp Source: Oral (05/28 1447) BP: 122/61 (05/28 1447) Pulse Rate: 57 (05/28 1638)  Labs: Recent Labs    07/25/20 1739 07/25/20 1918 07/26/20 0143 07/26/20 0904 07/26/20 1616  HGB 14.8  --  14.7  --   --   HCT 42.6  --  42.7  --   --   PLT 200  --  178  --   --   APTT 26  --   --   --   --   LABPROT 13.0  --   --   --   --   INR 1.0  --   --   --   --   HEPARINUNFRC  --   --  0.22* 0.28* 0.35  CREATININE 1.26*  --  1.19  --   --   TROPONINIHS 168* 609*  --  41,324*  --     Estimated Creatinine Clearance: 82 mL/min (by C-G formula based on SCr of 1.19 mg/dL).   Medical History: Past Medical History:  Diagnosis Date  . Acid reflux   . Allergic asthma    rare flare up, worse with alllergies as of 12/2016  . Coronary artery disease    a. 08/1998 Lateral MI/PCI/BMS [place]; b. 2001 PTCA x 2 2/2 ISR in [place]; c. 2002 PCI/brachyrx 2/2 ISR in [place]; d. 01/2001 CABG x 4: LIMA->LAD, RIMA->RI, VG->RPL->RPDA; e. 01/2014 MV: Low risk w/ attenuation artifact and mild inf basal isch. Nl EF->Med rx.  Marland Kitchen DVT (deep venous thrombosis) ([place])    a. 01/2001 after CABG.  . Erectile dysfunction   . Hyperlipidemia LDL goal <70   . Hypertension   . Hypothyroidism   . Palpitations   . Pericardial effusion    a. 01/2001 Effusion w/ tamponade req window following CABG.  . Tobacco use    vape, no cigarrette since 04/2015  . Type II diabetes mellitus ([facility])     Medications:  Per chart review and pharmacy technician confirmed with pt he is not on any  anticoagulation  Assessment: 55 year old patient with a history of diabetes, hypertension, hypercholesterolemia and obesity presents for evaluation of chest pain. Pt has history of stent, CABG. Pharmacy has been consulted for heparin dosing and monitoring for ACS/STEMI.  HS trop 168. Baseline CBC, PT, INR, and aPTT WNL.   5/28:  HL @ 0143 = 0.22  1400 units IV bolus, increase rate to 1400 units/hr.  5/28:  HL @ 0904= 0.28  1400 units IV bolus, increase drip rate to 1600 units/hr.  5/28:  HL @ 1616= 0.35  Thera x1; continue at 1600 units/hr.  Goal of Therapy:  Heparin level 0.3-0.7 units/ml Monitor platelets by anticoagulation protocol: Yes   Plan:  5/28:  HL @ 1616= 0.35  Thera x1; continue at 1600 units/hr.  Will get confirmation HL in 6 hrs after; then daily only if 2 consecutively therapeutic.   Lorna Dibble, PharmD 07/26/2020 6:25 PM

## 2020-07-26 NOTE — Progress Notes (Signed)
Critical troponin [id number] reported to Dr Si Raider. No new orders at this time. Will continue to monitor.

## 2020-07-26 NOTE — Progress Notes (Signed)
Pt 0/10 pain at this time. Continues on heparin drip. Alert and oriented x 4. Family member at bedside. All pt needs met at this time.

## 2020-07-26 NOTE — Consult Note (Signed)
Cardiology Consult    Patient ID: PANTELIS ELGERSMA MRN: [id number], DOB/AGE: 11-24-1965   Admit date: 07/25/2020 Date of Consult: 07/26/2020  Primary Physician: Carlena Hurl, PA-C Primary Cardiologist: Previously [place] - establishing w/ T. Rockey Situ, MD here Requesting Provider: [name], MD  Patient Profile    Todd Kelley is a 55 y.o. male with a history of CAD s/p prior lateral MI (2000) and CABG x 4 in 04/5850 w/ complicated post-op course including tamponade req window & DVT, HTN, HL, DMII, tob/vape use, GERD, hypothyroidism, palpitations, and ED, who is being seen today for the evaluation of chest pain/NSTEMI at the request of Dr. Si Raider.  Past Medical History   Past Medical History:  Diagnosis Date  . Acid reflux   . Allergic asthma    rare flare up, worse with alllergies as of 12/2016  . Coronary artery disease    a. 08/1998 Lateral MI/PCI/BMS [place]; b. 2001 PTCA x 2 2/2 ISR in [place]; c. 2002 PCI/brachyrx 2/2 ISR in [place]; d. 01/2001 CABG x 4: LIMA->LAD, RIMA->RI, VG->RPL->RPDA; e. 01/2014 MV: Low risk w/ attenuation artifact and mild inf basal isch. Nl EF->Med rx.  Marland Kitchen DVT (deep venous thrombosis) ([place])    a. 01/2001 after CABG.  . Erectile dysfunction   . Hyperlipidemia LDL goal <70   . Hypertension   . Hypothyroidism   . Palpitations   . Pericardial effusion    a. 01/2001 Effusion w/ tamponade req window following CABG.  . Tobacco use    vape, no cigarrette since 04/2015  . Type II diabetes mellitus ([place])     Past Surgical History:  Procedure Laterality Date  . APPENDECTOMY  2001  . CARDIAC CATHETERIZATION  2000   stent  . COLONOSCOPY WITH PROPOFOL N/A 03/17/2018   Procedure: COLONOSCOPY WITH PROPOFOL;  Surgeon: Lucilla Lame, MD;  Location: [facility];  Service: Endoscopy;  Laterality: N/A;  . CORONARY ANGIOPLASTY  jan. 2001  . CORONARY ANGIOPLASTY WITH STENT PLACEMENT  june 2001   stent  . CORONARY ARTERY BYPASS GRAFT  dec. 2002   LIMA graft to LAD,RIMA graft to the  intermediate branch and saphenous vein graft  to the posterior lateral and posterior descending branches of the right coronary  . POLYPECTOMY  03/17/2018   Procedure: POLYPECTOMY;  Surgeon: Lucilla Lame, MD;  Location: [facility];  Service: Endoscopy;;     Allergies  Allergies  Allergen Reactions  . Farxiga [Dapagliflozin]     weak  . Statins     Myalgias other than low dose  . Tetracycline     REACTION: Nervous,shakey    History of Present Illness    55 year old male with the above complex past medical history including coronary artery disease, hypertension, hyperlipidemia, type 2 diabetes mellitus, tobacco/vape usage, GERD, hypothyroidism, palpitations, and erectile dysfunction.  Cardiac history dates back to 2000, when he suffered a lateral myocardial infarction underwent bare-metal stenting of the ramus intermedius.  Unfortunately, he had recurrent in-stent restenosis requiring PTCA x2 in 2001 and then PCI and brachytherapy in 2002.  Due to in-stent restenosis in December 2002, decision made to pursue CABG x4.  Postoperative course was complicated by tamponade requiring a pericardial window, as well as a DVT.  He subsequently did well and was followed by Dr. Martinique in [place].  His last stress test was in 2015 and was low risk.  He was last in cardiology clinic in 2017.  He has since moved to [place], [place] and has been  managed by his primary care provider.  Since his last cardiology visit, he has done reasonably well.  He is active but he does not routinely exercise.  He was in his usual state of health until about 3 to 4 weeks ago, when he noted 3/10 exertional upper chest and jaw pain while walking at work.  This resolved after about 5 minutes.  Since then, he has had intermittent upper chest and jaw discomfort, may be occurring 1 or 2 times per week without rhyme or reason has been able to do some yard work or other activity without any symptoms at all.  About 2 weeks  ago, he had more severe upper chest and jaw pain associated with fatigue after having intercourse with his wife.  Following this, he decided to make an appointment with cardiology and had plan to follow-up with [facility] clinic, since their office was near his home.  Earlier this week, he had a recurrent episode of substernal chest discomfort and dyspnea when moving furniture with his wife.  This lasted several hours and eventually resolved.  He did take a Gas-X and was not sure if it helped or not.  On May 27, he picked up some lawnmower parts at [place] and while he was leaving the store noted recurrent, mild upper chest and jaw pain.  This resolved while he drove to his father's house.  Once at his father's house, he walked about 50 yards to the shop in the backyard and had more severe substernal chest pain associated with dyspnea.  This persisted for about 30 to 40 minutes and then eventually resolved.  He was following this episode, that he drove home, picked up his wife, and then headed to the emergency department.  Here, blood pressure was 183/100 on arrival.  He was not able to be given nitrates secondary to recent Cialis usage.  Initial troponin was elevated at 168 with a follow-up of 609 last night.  He was hypokalemic and this has been supplemented.  When I saw him this morning, he reported intermittent chest discomfort throughout the night for which, he had been receiving morphine.  We recycled the troponin and this came back at 19,627.  He is currently pain-free.  Inpatient Medications    . aspirin EC  81 mg Oral Daily  . carvedilol  3.125 mg Oral BID WC  . escitalopram  10 mg Oral Daily  . ezetimibe  10 mg Oral Daily  . fenofibrate  160 mg Oral Daily  . insulin aspart  0-15 Units Subcutaneous TID WC  . insulin aspart  0-5 Units Subcutaneous QHS  . irbesartan  150 mg Oral Daily  . levothyroxine  150 mcg Oral QAC breakfast  . polyethylene glycol  17 g Oral Daily  . rosuvastatin  10 mg  Oral QHS    Family History    Family History  Problem Relation Age of Onset  . Liver cancer Mother   . Cancer Mother   . Heart disease Father        cabg , arrhythmia  . Hypertension Father   . Valvular heart disease Father   . Heart disease Sister        arrhythmia  . Thyroid disease Brother   . Diabetes Paternal Grandmother   . Cancer Paternal Grandfather        leukemia  . Cancer Brother 74       lung and brain, seizures  . Liver disease Brother   . Asthma Brother   .  Suicidality Brother   . Multiple sclerosis Sister   . Prostate cancer Neg Hx   . Kidney cancer Neg Hx    He indicated that his mother is deceased. He indicated that his father is alive. He indicated that only one of his two sisters is alive. He indicated that two of his four brothers are alive. He indicated that the status of his paternal grandmother is unknown. He indicated that the status of his paternal grandfather is unknown. He indicated that the status of his neg hx is unknown.   Social History    Social History   Socioeconomic History  . Marital status: Married    Spouse name: Not on file  . Number of children: 3  . Years of education: Not on file  . Highest education level: Not on file  Occupational History  . 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 Smoking status: Former Smoker    Packs/day: 0.50    Years: 10.00    Pack years: 5.00    Types: Cigarettes    Quit date: 04/08/2015    Years since quitting: 5.3  . Smokeless tobacco: Never Used  Vaping Use  . Vaping Use: Every day  . Start date: 04/08/2015  . Substances: Nicotine, Flavoring  . Devices:    Substance and Sexual Activity  . Alcohol use: Yes    Alcohol/week: 5.0 standard drinks    Types: 5 Cans of beer per week    Comment: occ.  . Drug use: No  . Sexual activity: Not on file  Other Topics Concern  . Not on file  Social History Narrative   Lives with wife, 3 dogs, 3 children, and 2 twins .  Exercise - walking ,  hunting.   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   As of 03/2019   Social Determinants of Health   Financial Resource Strain: Not on file  Food Insecurity: Not on file  Transportation Needs: Not on file  Physical Activity: Not on file  Stress: Not on file  Social Connections: Not on file  Intimate Partner Violence: Not on file     Review of Systems    General:  No chills, fever, night sweats or weight changes.  Cardiovascular:  +++ chest pain, +++ dyspnea on exertion, no edema, orthopnea, palpitations, paroxysmal nocturnal dyspnea. Dermatological: No rash, lesions/masses Respiratory: No cough, +++ dyspnea associated with chest pain. Urologic: No hematuria, dysuria Abdominal:   No nausea, vomiting, diarrhea, bright red blood per rectum, melena, or hematemesis Neurologic:  No visual changes, wkns, changes in mental status. All other systems reviewed and are otherwise negative except as noted above.  Physical Exam    Blood pressure 122/61, pulse (!) 54, temperature 98 F (36.7 C), temperature source Oral, resp. rate 18, height 5\' 10"  (1.778 m), weight 99.6 kg, SpO2 94 %.  General: Pleasant, NAD Psych: Normal affect. Neuro: Alert and oriented X 3. Moves all extremities spontaneously. HEENT: Normal  Neck: Supple without bruits or JVD. Lungs:  Resp regular and unlabored, CTA. Heart: RRR no s3, s4, or murmurs. Abdomen: Soft, non-tender, non-distended, BS + x 4.  Extremities: No clubbing, cyanosis or edema. DP/PT2+, Radials 2+ and equal bilaterally.  Labs    Cardiac Enzymes Recent Labs  Lab 07/25/20 1739 07/25/20 1918 07/26/20 0904  TROPONINIHS 168* 609* 19,627*      Lab Results  Component Value Date   WBC 8.0 07/26/2020   HGB 14.7 07/26/2020   HCT 42.7 07/26/2020   MCV  85.7 07/26/2020   PLT 178 07/26/2020    Recent Labs  Lab 07/26/20 0143  NA 140  K 3.3*  CL 106  CO2 26  BUN 12  CREATININE 1.19  CALCIUM 9.2  GLUCOSE 204*   Lab Results   Component Value Date   CHOL 207 (H) 07/26/2020   HDL 41 07/26/2020   LDLCALC 138 (H) 07/26/2020   TRIG 141 07/26/2020    Radiology Studies    DG Chest Port 1 View  Result Date: 07/25/2020 CLINICAL DATA:  Chest pain on exertion EXAM: PORTABLE CHEST 1 VIEW COMPARISON:  Radiograph 06/19/2005 FINDINGS: Postsurgical changes related to prior CABG including intact and aligned sternotomy wires and multiple surgical clips projecting over the mediastinum. The aorta is calcified. The remaining cardiomediastinal contours are unremarkable. No consolidation, features of edema, pneumothorax, or effusion. Pulmonary vascularity is normally distributed. No acute osseous or soft tissue abnormality. Telemetry leads overlie the chest. IMPRESSION: No acute cardiopulmonary abnormality. Prior sternotomy and CABG. Electronically Signed   By: Lovena Le M.D.   On: 07/25/2020 17:38    ECG & Cardiac Imaging    RSR, 70, LAD, LAE, 71mm ST dep in I/aVL, poor R progression - personally reviewed. F/u ECG on 5/27 @ 1916 - sinus brady, 49, LAD, LAE, resolution of ST dep, poor R progression - personally reviewed.  Assessment & Plan    1.  NSTEMI/coronary artery disease: Status post lateral MI in 2000 with bare-metal stenting of the ramus intermedius complicated by in-stent restenosis requiring repeat percutaneous interventions and subsequent brachytherapy in 2001 and 2002 followed by CABG x4 in late 2002.  Postop course was complicated by tamponade requiring pericardial window and subsequently a DVT.  Ultimately, he recovered well but occasionally notes a twinge of chest pain related to his sternal incision.  Over the past 3 to 4 weeks, he has been experiencing exertional upper chest and jaw pain, on and off.  He had a more severe and longer episode this past Sunday, and again on May 27, prompting him to present to the ED.  Here, he was hypertensive.  Initial troponin was mildly elevated at 168 but has since risen to 19,627.  As  he took cialis on 5/27, he has not a candidate for nitrates at this time and has been managed with morphine.  He is currently pain-free.  We did discuss his case with our interventional cardiology team and given that his symptoms improved, we will hold off on catheterization today.  Patient understands that if he has recurrent/recalcitrant pain that cannot be managed medically, that he will require diagnostic catheterization this weekend.  Otherwise, we will plan on cath on Monday.  Continue heparin, aspirin, beta-blocker, ARB, statin.  He has intolerance to high potency statins and Zetia. The patient understands that risks include but are not limited to stroke (1 in 1000), death (1 in 16), kidney failure [usually temporary] (1 in 500), bleeding (1 in 200), allergic reaction [possibly serious] (1 in 200), and agrees to proceed.    2.  Essential hypertension: Currently stable.  Continue beta-blocker, ARB.  3.  Hyperlipidemia: On low-dose rosuvastatin in setting of prior intolerance to higher doses.  LDL 138.  Adding Zetia.  May need PCSK9 inhibitor as an outpatient.  4.  Type 2 diabetes mellitus: Sliding scale insulin per medicine.  Continue statin and ARB.  5.  Hypokalemia: Supplementation has been ordered.  Signed, Murray Hodgkins, NP 07/26/2020, 3:04 PM  For questions or updates, please contact   Please  consult [email or web address] for contact info under Cardiology/STEMI.

## 2020-07-27 DIAGNOSIS — E1165 Type 2 diabetes mellitus with hyperglycemia: Secondary | ICD-10-CM

## 2020-07-27 DIAGNOSIS — Z951 Presence of aortocoronary bypass graft: Secondary | ICD-10-CM | POA: Diagnosis not present

## 2020-07-27 DIAGNOSIS — I214 Non-ST elevation (NSTEMI) myocardial infarction: Secondary | ICD-10-CM | POA: Diagnosis not present

## 2020-07-27 DIAGNOSIS — I10 Essential (primary) hypertension: Secondary | ICD-10-CM | POA: Diagnosis not present

## 2020-07-27 DIAGNOSIS — I200 Unstable angina: Secondary | ICD-10-CM | POA: Diagnosis not present

## 2020-07-27 LAB — CBC WITH DIFFERENTIAL/PLATELET
Abs Immature Granulocytes: 0.02 10*3/uL (ref 0.00–0.07)
Basophils Absolute: 0 10*3/uL (ref 0.0–0.1)
Basophils Relative: 0 %
Eosinophils Absolute: 0.4 10*3/uL (ref 0.0–0.5)
Eosinophils Relative: 5 %
HCT: 43.5 % (ref 39.0–52.0)
Hemoglobin: 14.7 g/dL (ref 13.0–17.0)
Immature Granulocytes: 0 %
Lymphocytes Relative: 25 %
Lymphs Abs: 2.1 10*3/uL (ref 0.7–4.0)
MCH: 29.3 pg (ref 26.0–34.0)
MCHC: 33.8 g/dL (ref 30.0–36.0)
MCV: 86.7 fL (ref 80.0–100.0)
Monocytes Absolute: 0.7 10*3/uL (ref 0.1–1.0)
Monocytes Relative: 8 %
Neutro Abs: 5.1 10*3/uL (ref 1.7–7.7)
Neutrophils Relative %: 62 %
Platelets: 157 10*3/uL (ref 150–400)
RBC: 5.02 MIL/uL (ref 4.22–5.81)
RDW: 12.8 % (ref 11.5–15.5)
WBC: 8.2 10*3/uL (ref 4.0–10.5)
nRBC: 0 % (ref 0.0–0.2)

## 2020-07-27 LAB — COMPREHENSIVE METABOLIC PANEL
ALT: 32 U/L (ref 0–44)
AST: 63 U/L — ABNORMAL HIGH (ref 15–41)
Albumin: 4 g/dL (ref 3.5–5.0)
Alkaline Phosphatase: 45 U/L (ref 38–126)
Anion gap: 6 (ref 5–15)
BUN: 13 mg/dL (ref 6–20)
CO2: 24 mmol/L (ref 22–32)
Calcium: 9 mg/dL (ref 8.9–10.3)
Chloride: 109 mmol/L (ref 98–111)
Creatinine, Ser: 1.04 mg/dL (ref 0.61–1.24)
GFR, Estimated: >60 mL/min (ref >60)
Glucose, Bld: 155 mg/dL — ABNORMAL HIGH (ref 70–99)
Potassium: 4 mmol/L (ref 3.5–5.1)
Sodium: 139 mmol/L (ref 135–145)
Total Bilirubin: 0.9 mg/dL (ref 0.3–1.2)
Total Protein: 6.9 g/dL (ref 6.5–8.1)

## 2020-07-27 LAB — CBC
HCT: 42.8 % (ref 39.0–52.0)
Hemoglobin: 14.5 g/dL (ref 13.0–17.0)
MCH: 28.9 pg (ref 26.0–34.0)
MCHC: 33.9 g/dL (ref 30.0–36.0)
MCV: 85.3 fL (ref 80.0–100.0)
Platelets: 169 10*3/uL (ref 150–400)
RBC: 5.02 MIL/uL (ref 4.22–5.81)
RDW: 12.6 % (ref 11.5–15.5)
WBC: 8.9 10*3/uL (ref 4.0–10.5)
nRBC: 0 % (ref 0.0–0.2)

## 2020-07-27 LAB — HEPARIN LEVEL (UNFRACTIONATED)
Heparin Unfractionated: 0.33 IU/mL (ref 0.30–0.70)
Heparin Unfractionated: 0.29 IU/mL — ABNORMAL LOW (ref 0.30–0.70)

## 2020-07-27 LAB — GLUCOSE, CAPILLARY
Glucose-Capillary: 172 mg/dL — ABNORMAL HIGH (ref 70–99)
Glucose-Capillary: 166 mg/dL — ABNORMAL HIGH (ref 70–99)
Glucose-Capillary: 154 mg/dL — ABNORMAL HIGH (ref 70–99)
Glucose-Capillary: 109 mg/dL — ABNORMAL HIGH (ref 70–99)
Glucose-Capillary: 212 mg/dL — ABNORMAL HIGH (ref 70–99)

## 2020-07-27 LAB — TROPONIN I (HIGH SENSITIVITY): Troponin I (High Sensitivity): 11933 ng/L (ref <18)

## 2020-07-27 LAB — MAGNESIUM: Magnesium: 1.9 mg/dL (ref 1.7–2.4)

## 2020-07-27 MED ORDER — NITROGLYCERIN 2 % TD OINT
1.0000 [in_us] | TOPICAL_OINTMENT | Freq: Four times a day (QID) | TRANSDERMAL | Status: DC
  Administered 2020-07-28 (×4): 1 [in_us] via TOPICAL
  Filled 2020-07-27 (×6): qty 1

## 2020-07-27 NOTE — Progress Notes (Signed)
This RN noticed pt was dropping Pwaves on telemetry monitor more frequently than usual. I entered the room to find patient standing at bedside. Pt stated "I just woke up really hot all of a sudden." Temperature checked and was 99.1 orally. Pt requested tylenol. Tylenol given per pt request. NT performed 2 EKGs at this time d/t first EKG resulting "critical" on EKG machine. NT delivered both EKG's to this nurse. NP was made aware of situation and both EKG's in chart. NP reviewed and gave no further orders at this time.

## 2020-07-27 NOTE — Progress Notes (Signed)
Called into pt room d/t IV alarming. I found the patient to be flushed and diaphoretic. BG checked at this time; found to be 172. This was no drastic change from beginning of the shift BG. Pt states "I'm feel clammy. I just don't feel right." I asked patient to elaborate on this statement and pt stated "I don't know how to explain how I feel, I just feel weird." Pt states he is having no chest pain at this time; however, does report that upon standing he feels lightheaded at times.  NP notified of this situation, no further orders given at this time.

## 2020-07-27 NOTE — Progress Notes (Addendum)
Progress Note  Patient Name: Todd Kelley Date of Encounter: 07/27/2020  Primary Cardiologist: previously [place] - will f/u in [place].  Subjective   Noted to have intermittent jxnl rhythm and bradycardia overnight.  At 1a, awoke hot, diaphoretic.  12 lead w/ sinus and jxnl escapes, IVCD and lateral ST dep/TWI.  Currently asymptomatic but had c/p earlier and received morphine around 7:30a.  Inpatient Medications    Scheduled Meds: . aspirin EC  81 mg Oral Daily  . carvedilol  3.125 mg Oral BID WC  . escitalopram  10 mg Oral Daily  . ezetimibe  10 mg Oral Daily  . fenofibrate  160 mg Oral Daily  . insulin aspart  0-15 Units Subcutaneous TID WC  . insulin aspart  0-5 Units Subcutaneous QHS  . irbesartan  150 mg Oral Daily  . levothyroxine  150 mcg Oral QAC breakfast  . polyethylene glycol  17 g Oral Daily  . rosuvastatin  10 mg Oral QHS  . sodium chloride flush  3 mL Intravenous Q12H   Continuous Infusions: . heparin 1,600 Units/hr (07/27/20 0225)   PRN Meds: acetaminophen, ALPRAZolam, morphine injection, ondansetron (ZOFRAN) IV   Vital Signs    Vitals:   07/26/20 2034 07/27/20 0116 07/27/20 0358 07/27/20 0741  BP: 116/82 125/78 133/65 (!) 123/91  Pulse: 68 (!) 59 (!) 56 62  Resp: 20 18 16 17   Temp: 98.9 F (37.2 C) 98.8 F (37.1 C) 98.9 F (37.2 C) 98.3 F (36.8 C)  TempSrc: Oral     SpO2: 96% 96% 98% 98%  Weight:      Height:        Intake/Output Summary (Last 24 hours) at 07/27/2020 0754 Last data filed at 07/27/2020 0700 Gross per 24 hour  Intake 383.21 ml  Output 2275 ml  Net -1891.79 ml   Filed Weights   07/25/20 1720 07/25/20 2245  Weight: 97.5 kg 99.6 kg    Physical Exam   GEN: Well nourished, well developed, in no acute distress.  HEENT: Grossly normal.  Neck: Supple, no JVD, carotid bruits, or masses. Cardiac: RRR, no murmurs, rubs, or gallops. No clubbing, cyanosis, edema.  Radials 2+, DP/PT 2+ and equal bilaterally.  Respiratory:   Respirations regular and unlabored, clear to auscultation bilaterally. GI: Soft, nontender, nondistended, BS + x 4. MS: no deformity or atrophy. Skin: warm and dry, no rash. Neuro:  Strength and sensation are intact. Psych: AAOx3.  Normal affect.  Labs    Chemistry Recent Labs  Lab 07/25/20 1739 07/26/20 0143 07/27/20 0542  NA 138 140 139  K 3.3* 3.3* 4.0  CL 105 106 109  CO2 23 26 24   GLUCOSE 151* 204* 155*  BUN 13 12 13   CREATININE 1.26* 1.19 1.04  CALCIUM 9.9 9.2 9.0  PROT  --   --  6.9  ALBUMIN  --   --  4.0  AST  --   --  63*  ALT  --   --  32  ALKPHOS  --   --  45  BILITOT  --   --  0.9  GFRNONAA >60 >60 >60  ANIONGAP 10 8 6      Hematology Recent Labs  Lab 07/26/20 0143 07/27/20 0205 07/27/20 0542  WBC 8.0 8.9 8.2  RBC 4.98 5.02 5.02  HGB 14.7 14.5 14.7  HCT 42.7 42.8 43.5  MCV 85.7 85.3 86.7  MCH 29.5 28.9 29.3  MCHC 34.4 33.9 33.8  RDW 12.6 12.6 12.8  PLT 178 169  157    Cardiac Enzymes  Recent Labs  Lab 07/25/20 1739 07/25/20 1918 08-09-20 0904 07/27/20 0542  TROPONINIHS 168* 609* 19,627* 11,933*     Lipids  Lab Results  Component Value Date   CHOL 207 (H) 08-09-2020   HDL 41 08-09-20   LDLCALC 138 (H) 09-Aug-2020   TRIG 141 08-09-2020   CHOLHDL 5.0 08-09-20    HbA1c  Lab Results  Component Value Date   HGBA1C 7.2 (H) 03/14/2020    Radiology    DG Chest Port 1 View  Result Date: 07/25/2020 CLINICAL DATA:  Chest pain on exertion EXAM: PORTABLE CHEST 1 VIEW COMPARISON:  Radiograph 06/19/2005 FINDINGS: Postsurgical changes related to prior CABG including intact and aligned sternotomy wires and multiple surgical clips projecting over the mediastinum. The aorta is calcified. The remaining cardiomediastinal contours are unremarkable. No consolidation, features of edema, pneumothorax, or effusion. Pulmonary vascularity is normally distributed. No acute osseous or soft tissue abnormality. Telemetry leads overlie the chest. IMPRESSION:  No acute cardiopulmonary abnormality. Prior sternotomy and CABG. Electronically Signed   By: Lovena Le M.D.   On: 07/25/2020 17:38    Telemetry    RSR, freq jxnl escape beats and runs of jxnl rhythm  Rates 50s to 60s - Personally Reviewed  ECG    5/29 @ 1am: Sinus rhythm @ 65 w/ jxnl beats, PVC, LAD, IVCD, lateral ST dep/TWI - more pronounced than no 08-10-22 ECG - Personally Reviewed 5/29 08:43: Sinus rhythm, 61, LAD, PVC, jxnl beats following PVC, LVH w/ lateral ST dep/TWI - overall similar to 1am ECG - personally reviewed  Cardiac Studies   2D Echocardiogram pending  Patient Profile     55 y.o. male with a history of CAD s/p prior lateral MI (2000) and CABG x 4 in 16/1096 w/ complicated post-op course including tamponade req window & DVT, HTN, HL, DMII, tob/vape use, GERD, hypothyroidism, palpitations, and ED, who was admitted 5/27 w/ chest pain and NSTEMI.  Assessment & Plan    1.  NSTEMI/CAD:  Status post lateral MI in 2000 with bare-metal stenting of the ramus intermedius complicated by in-stent restenosis requiring repeat percutaneous interventions and subsequent brachytherapy in 2001 and 2002 followed by CABG x4 in late 2002.  Low risk MV in 2015.  Admitted 5/27 w/ progressive exertional c/p over the past 3-4 wks.  HsTrop rose to 19627.  Has continued to have stuttering c/p managed w/ IV morphine (took cialis 5/27).  Also episode of diaphoresis overnight w/ more freq jxnl rhythm.  HRs mildly elevated on tele during this time.  ECG @ 1a w/ IVCD and more pronounced lateral ST dep/TWI than on 10/28 ECG.  Had mild c/p earlier this AM and received morphine, currently pain free.  F/u HsTrop this AM, trending down @ 11933.  F/u ecg this AM similar to ECG from 1a.  I'm going to hold  blocker given brady/jxnl rhythm.  As it is now 48 hrs since last cialis dose, with intermittent c/p, will add nitropaste.  Cont heparin, asa, ARB, statin, zetia.  May need to consider urgent cath today pending f/u  ECG.  Otw, plan on cath tomorrow.  The patient understands that risks include but are not limited to stroke (1 in 1000), death (1 in 52), kidney failure [usually temporary] (1 in 500), bleeding (1 in 200), allergic reaction [possibly serious] (1 in 200), and agrees to proceed.   2. Essential HTN:  Has been stable.  Holding  blocker 2/2 brady/jxnl rhythm.  3.  HL:  On low-dose rosuva and zetia in setting of statin intolerance @ higher doses.  LDL 138.  May need PCSK9i as outpt.  4.  DMII:  SSI per IM.  5.  Hypokalemia:  Resolved.  6.  Junctional bradycardia:  Noted mostly overnight, but occurring while awake as well.  Has a h/o snoring.  Will need outpt sleep study.  Holding  blocker.  7.  Snores/sleep disordered breathing:  Will need outpt sleep eval.  Signed, Murray Hodgkins, NP  07/27/2020, 7:54 AM    For questions or updates, please contact   Please consult [email or web address] for contact info under Cardiology/STEMI.

## 2020-07-27 NOTE — Progress Notes (Signed)
PROGRESS NOTE    Todd Kelley  ZYY:[id number] DOB: [birth year] DOA: 07/25/2020 PCP: Carlena Hurl, PA-C  Outpatient Specialists: previous [facility] patient    Brief Narrative:   Todd Kelley is a 55 y.o. male with medical history significant for CAD with hx of prior stents and CABGx3 2002, with last cardiac evaluation with nuclear stress test 2015 with a fixed defect, DM, HTN, HLDhypothyroidism, on daily cialis for   erectile dysfunction who presents to the ED with a 1-week history of chest pain on exertion resolved with rest now becoming more constant and frequent  Pain was tightness and substernal area nonradiating and severe intensity, radiating to jaw, without associated nausea, vomiting or diaphoresis.  He denies cough, fever or chills, shortness of breath, lower extremity pain or swelling ED course: On arrival, afebrile, BP 183/100, pulse 61 respirations 18 with O2 sat 97% on room air.  First troponin 168.  Mild creatinine elevation of 1.26, potassium 3.3.  Otherwise blood work including CBC and BMP unremarkable   Assessment & Plan:   Principal Problem:   NSTEMI (non-ST elevated myocardial infarction) ([place]) Active Problems:   Coronary artery disease   S/P CABG (coronary artery bypass graft)   Essential hypertension   BPH (benign prostatic hyperplasia)   Controlled type 2 diabetes mellitus with complication, without long-term current use of insulin ([place])   #  NSTEMI /unstable angina CAD S/P pci with des, subsequent interventions, CABG 2002. Patient presenting with 2 days of typical chest pain troponin 168 > 19,000 > 11,000, EKG with subtle ST-T wave changes - Continue heparin infusion - nitrates ordered today given 48 hours since cialis - Aspirin, crestor - bb on hold 2/2 junctional rhythm -Morphine for pain control - likely cath tomorrow  Mild hypokalemia Resolved - monitor  Essential hypertension - Continue home irbesartan  HLD - Continue Crestor, says  higher dose cause joint pains  Hypothyroidism - Continue Synthroid   # t2dm Pt informed of this diagnosis today. Mild glucose elevations -Sliding scale insulin coverage - consider stlg2i at d/c given ascvd   DVT prophylaxis: heparin Code Status: full Family Communication: son updated @ bedside 5/29  Level of care: Progressive Cardiac Status is: Inpatient  Remains inpatient appropriate because:Inpatient level of care appropriate due to severity of illness   Dispo: The patient is from: Home              Anticipated d/c is to: Home              Patient currently is not medically stable to d/c.   Difficult to place patient No        Consultants:  cardiology  Procedures: none  Antimicrobials:  none    Subjective: No chest pain or dyspnea. Feeling "fine"  Objective: Vitals:   07/27/20 0116 07/27/20 0358 07/27/20 0741 07/27/20 1141  BP: 125/78 133/65 (!) 123/91 (!) 140/55  Pulse: (!) 59 (!) 56 62 (!) 55  Resp: 18 16 17 18   Temp: 98.8 F (37.1 C) 98.9 F (37.2 C) 98.3 F (36.8 C) 98.1 F (36.7 C)  TempSrc:      SpO2: 96% 98% 98% 98%  Weight:      Height:        Intake/Output Summary (Last 24 hours) at 07/27/2020 1206 Last data filed at 07/27/2020 0700 Gross per 24 hour  Intake 383.21 ml  Output 1600 ml  Net -1216.79 ml   Filed Weights   07/25/20 1720 07/25/20 2245  Weight:  97.5 kg 99.6 kg    Examination:  General exam: Appears calm and comfortable  Respiratory system: Clear to auscultation. Respiratory effort normal. Cardiovascular system: S1 & S2 heard, RRR. No JVD, murmurs, rubs, gallops or clicks. No pedal edema. Gastrointestinal system: Abdomen is nondistended, soft and nontender. No organomegaly or masses felt. Normal bowel sounds heard. Central nervous system: Alert and oriented. No focal neurological deficits. Extremities: Symmetric 5 x 5 power. Skin: No rashes, lesions or ulcers Psychiatry: Judgement and insight appear normal. Mood &  affect appropriate.     Data Reviewed: I have personally reviewed following labs and imaging studies  CBC: Recent Labs  Lab 07/25/20 1739 07/26/20 0143 07/27/20 0205 07/27/20 0542  WBC 10.1 8.0 8.9 8.2  NEUTROABS  --   --   --  5.1  HGB 14.8 14.7 14.5 14.7  HCT 42.6 42.7 42.8 43.5  MCV 84.7 85.7 85.3 86.7  PLT 200 178 169 992   Basic Metabolic Panel: Recent Labs  Lab 07/25/20 1739 07/26/20 0143 07/26/20 0904 07/27/20 0205 07/27/20 0542  NA 138 140  --   --  139  K 3.3* 3.3*  --   --  4.0  CL 105 106  --   --  109  CO2 23 26  --   --  24  GLUCOSE 151* 204*  --   --  155*  BUN 13 12  --   --  13  CREATININE 1.26* 1.19  --   --  1.04  CALCIUM 9.9 9.2  --   --  9.0  MG  --   --  2.0 1.9  --    GFR: Estimated Creatinine Clearance: 93.8 mL/min (by C-G formula based on SCr of 1.04 mg/dL). Liver Function Tests: Recent Labs  Lab 07/27/20 0542  AST 63*  ALT 32  ALKPHOS 45  BILITOT 0.9  PROT 6.9  ALBUMIN 4.0   No results for input(s): LIPASE, AMYLASE in the last 168 hours. No results for input(s): AMMONIA in the last 168 hours. Coagulation Profile: Recent Labs  Lab 07/25/20 1739  INR 1.0   Cardiac Enzymes: No results for input(s): CKTOTAL, CKMB, CKMBINDEX, TROPONINI in the last 168 hours. BNP (last 3 results) No results for input(s): PROBNP in the last 8760 hours. HbA1C: No results for input(s): HGBA1C in the last 72 hours. CBG: Recent Labs  Lab 07/26/20 1603 07/26/20 2002 07/27/20 0235 07/27/20 0743 07/27/20 1143  GLUCAP 156* 170* 172* 166* 154*   Lipid Profile: Recent Labs    07/26/20 0143  CHOL 207*  HDL 41  LDLCALC 138*  TRIG 141  CHOLHDL 5.0   Thyroid Function Tests: No results for input(s): TSH, T4TOTAL, FREET4, T3FREE, THYROIDAB in the last 72 hours. Anemia Panel: No results for input(s): VITAMINB12, FOLATE, FERRITIN, TIBC, IRON, RETICCTPCT in the last 72 hours. Urine analysis:    Component Value Date/Time   COLORURINE YELLOW (A)  03/29/2018 1055   APPEARANCEUR CLEAR (A) 03/29/2018 1055   LABSPEC 1.015 03/14/2020 1529   PHURINE 6.0 03/29/2018 1055   GLUCOSEU NEGATIVE 03/29/2018 1055   HGBUR NEGATIVE 03/29/2018 1055   BILIRUBINUR negative 03/14/2020 1529   KETONESUR negative 03/14/2020 1529   KETONESUR NEGATIVE 03/29/2018 1055   PROTEINUR negative 03/14/2020 1529   PROTEINUR NEGATIVE 03/29/2018 1055   NITRITE Negative 03/14/2020 1529   NITRITE NEGATIVE 03/29/2018 1055   LEUKOCYTESUR Negative 03/14/2020 1529   Sepsis Labs: @LABRCNTIP (procalcitonin:4,lacticidven:4)  ) Recent Results (from the past 240 hour(s))  SARS CORONAVIRUS 2 (TAT  6-24 HRS) Nasopharyngeal Nasopharyngeal Swab     Status: None   Collection Time: 07/25/20  5:45 PM   Specimen: Nasopharyngeal Swab  Result Value Ref Range Status   SARS Coronavirus 2 NEGATIVE NEGATIVE Final    Comment: (NOTE) SARS-CoV-2 target nucleic acids are NOT DETECTED.  The SARS-CoV-2 RNA is generally detectable in upper and lower respiratory specimens during the acute phase of infection. Negative results do not preclude SARS-CoV-2 infection, do not rule out co-infections with other pathogens, and should not be used as the sole basis for treatment or other patient management decisions. Negative results must be combined with clinical observations, patient history, and epidemiological information. The expected result is Negative.  Fact Sheet for Patients: SugarRoll.be  Fact Sheet for Healthcare Providers: https://www.woods-mathews.com/  This test is not yet approved or cleared by the [place] FDA and  has been authorized for detection and/or diagnosis of SARS-CoV-2 by FDA under an Emergency Use Authorization (EUA). This EUA will remain  in effect (meaning this test can be used) for the duration of the COVID-19 declaration under Se ction 564(b)(1) of the Act, 21 U.S.C. section 360bbb-3(b)(1), unless the authorization is  terminated or revoked sooner.  Performed at [facility], [place] [address] [place], [place] [address]          Radiology Studies: DG Chest Port 1 View  Result Date: 07/25/2020 CLINICAL DATA:  Chest pain on exertion EXAM: PORTABLE CHEST 1 VIEW COMPARISON:  Radiograph 06/19/2005 FINDINGS: Postsurgical changes related to prior CABG including intact and aligned sternotomy wires and multiple surgical clips projecting over the mediastinum. The aorta is calcified. The remaining cardiomediastinal contours are unremarkable. No consolidation, features of edema, pneumothorax, or effusion. Pulmonary vascularity is normally distributed. No acute osseous or soft tissue abnormality. Telemetry leads overlie the chest. IMPRESSION: No acute cardiopulmonary abnormality. Prior sternotomy and CABG. Electronically Signed   By: Lovena Le M.D.   On: 07/25/2020 17:38        Scheduled Meds: . aspirin EC  81 mg Oral Daily  . escitalopram  10 mg Oral Daily  . ezetimibe  10 mg Oral Daily  . fenofibrate  160 mg Oral Daily  . insulin aspart  0-15 Units Subcutaneous TID WC  . insulin aspart  0-5 Units Subcutaneous QHS  . irbesartan  150 mg Oral Daily  . levothyroxine  150 mcg Oral QAC breakfast  . nitroGLYCERIN  1 inch Topical Q6H  . polyethylene glycol  17 g Oral Daily  . rosuvastatin  10 mg Oral QHS  . sodium chloride flush  3 mL Intravenous Q12H   Continuous Infusions: . heparin 1,600 Units/hr (07/27/20 0225)     LOS: 2 days    Time spent: 25 min    Desma Maxim, MD [facility]   If 7PM-7AM, please contact night-coverage [email or web address] Password [facility] 07/27/2020, 12:06 PM

## 2020-07-27 NOTE — H&P (View-Only) (Signed)
Progress Note  Patient Name: Todd Kelley Date of Encounter: 07/27/2020  Primary Cardiologist: previously [place] - will f/u in [place].  Subjective   Noted to have intermittent jxnl rhythm and bradycardia overnight.  At 1a, awoke hot, diaphoretic.  12 lead w/ sinus and jxnl escapes, IVCD and lateral ST dep/TWI.  Currently asymptomatic but had c/p earlier and received morphine around 7:30a.  Inpatient Medications    Scheduled Meds: . aspirin EC  81 mg Oral Daily  . carvedilol  3.125 mg Oral BID WC  . escitalopram  10 mg Oral Daily  . ezetimibe  10 mg Oral Daily  . fenofibrate  160 mg Oral Daily  . insulin aspart  0-15 Units Subcutaneous TID WC  . insulin aspart  0-5 Units Subcutaneous QHS  . irbesartan  150 mg Oral Daily  . levothyroxine  150 mcg Oral QAC breakfast  . polyethylene glycol  17 g Oral Daily  . rosuvastatin  10 mg Oral QHS  . sodium chloride flush  3 mL Intravenous Q12H   Continuous Infusions: . heparin 1,600 Units/hr (07/27/20 0225)   PRN Meds: acetaminophen, ALPRAZolam, morphine injection, ondansetron (ZOFRAN) IV   Vital Signs    Vitals:   07/26/20 2034 07/27/20 0116 07/27/20 0358 07/27/20 0741  BP: 116/82 125/78 133/65 (!) 123/91  Pulse: 68 (!) 59 (!) 56 62  Resp: 20 18 16 17   Temp: 98.9 F (37.2 C) 98.8 F (37.1 C) 98.9 F (37.2 C) 98.3 F (36.8 C)  TempSrc: Oral     SpO2: 96% 96% 98% 98%  Weight:      Height:        Intake/Output Summary (Last 24 hours) at 07/27/2020 0754 Last data filed at 07/27/2020 0700 Gross per 24 hour  Intake 383.21 ml  Output 2275 ml  Net -1891.79 ml   Filed Weights   07/25/20 1720 07/25/20 2245  Weight: 97.5 kg 99.6 kg    Physical Exam   GEN: Well nourished, well developed, in no acute distress.  HEENT: Grossly normal.  Neck: Supple, no JVD, carotid bruits, or masses. Cardiac: RRR, no murmurs, rubs, or gallops. No clubbing, cyanosis, edema.  Radials 2+, DP/PT 2+ and equal bilaterally.  Respiratory:   Respirations regular and unlabored, clear to auscultation bilaterally. GI: Soft, nontender, nondistended, BS + x 4. MS: no deformity or atrophy. Skin: warm and dry, no rash. Neuro:  Strength and sensation are intact. Psych: AAOx3.  Normal affect.  Labs    Chemistry Recent Labs  Lab 07/25/20 1739 07/26/20 0143 07/27/20 0542  NA 138 140 139  K 3.3* 3.3* 4.0  CL 105 106 109  CO2 23 26 24   GLUCOSE 151* 204* 155*  BUN 13 12 13   CREATININE 1.26* 1.19 1.04  CALCIUM 9.9 9.2 9.0  PROT  --   --  6.9  ALBUMIN  --   --  4.0  AST  --   --  63*  ALT  --   --  32  ALKPHOS  --   --  45  BILITOT  --   --  0.9  GFRNONAA >60 >60 >60  ANIONGAP 10 8 6      Hematology Recent Labs  Lab 07/26/20 0143 07/27/20 0205 07/27/20 0542  WBC 8.0 8.9 8.2  RBC 4.98 5.02 5.02  HGB 14.7 14.5 14.7  HCT 42.7 42.8 43.5  MCV 85.7 85.3 86.7  MCH 29.5 28.9 29.3  MCHC 34.4 33.9 33.8  RDW 12.6 12.6 12.8  PLT 178 169  157    Cardiac Enzymes  Recent Labs  Lab 07/25/20 1739 07/25/20 1918 Jul 29, 2020 0904 07/27/20 0542  TROPONINIHS 168* 609* 19,627* 11,933*     Lipids  Lab Results  Component Value Date   CHOL 207 (H) 29-Jul-2020   HDL 41 07-29-20   LDLCALC 138 (H) 2020-07-29   TRIG 141 07-29-2020   CHOLHDL 5.0 2020-07-29    HbA1c  Lab Results  Component Value Date   HGBA1C 7.2 (H) 03/14/2020    Radiology    DG Chest Port 1 View  Result Date: 07/25/2020 CLINICAL DATA:  Chest pain on exertion EXAM: PORTABLE CHEST 1 VIEW COMPARISON:  Radiograph 06/19/2005 FINDINGS: Postsurgical changes related to prior CABG including intact and aligned sternotomy wires and multiple surgical clips projecting over the mediastinum. The aorta is calcified. The remaining cardiomediastinal contours are unremarkable. No consolidation, features of edema, pneumothorax, or effusion. Pulmonary vascularity is normally distributed. No acute osseous or soft tissue abnormality. Telemetry leads overlie the chest. IMPRESSION:  No acute cardiopulmonary abnormality. Prior sternotomy and CABG. Electronically Signed   By: Lovena Le M.D.   On: 07/25/2020 17:38    Telemetry    RSR, freq jxnl escape beats and runs of jxnl rhythm  Rates 50s to 60s - Personally Reviewed  ECG    5/29 @ 1am: Sinus rhythm @ 65 w/ jxnl beats, PVC, LAD, IVCD, lateral ST dep/TWI - more pronounced than no 07-30-22 ECG - Personally Reviewed 5/29 08:43: Sinus rhythm, 61, LAD, PVC, jxnl beats following PVC, LVH w/ lateral ST dep/TWI - overall similar to 1am ECG - personally reviewed  Cardiac Studies   2D Echocardiogram pending  Patient Profile     55 y.o. male with a history of CAD s/p prior lateral MI (2000) and CABG x 4 in 50/3546 w/ complicated post-op course including tamponade req window & DVT, HTN, HL, DMII, tob/vape use, GERD, hypothyroidism, palpitations, and ED, who was admitted 5/27 w/ chest pain and NSTEMI.  Assessment & Plan    1.  NSTEMI/CAD:  Status post lateral MI in 2000 with bare-metal stenting of the ramus intermedius complicated by in-stent restenosis requiring repeat percutaneous interventions and subsequent brachytherapy in 2001 and 2002 followed by CABG x4 in late 2002.  Low risk MV in 2015.  Admitted 5/27 w/ progressive exertional c/p over the past 3-4 wks.  HsTrop rose to 196[address].  Has continued to have stuttering c/p managed w/ IV morphine (took cialis 5/27).  Also episode of diaphoresis overnight w/ more freq jxnl rhythm.  HRs mildly elevated on tele during this time.  ECG @ 1a w/ IVCD and more pronounced lateral ST dep/TWI than on 10/28 ECG.  Had mild c/p earlier this AM and received morphine, currently pain free.  F/u HsTrop this AM, trending down @ 11933.  F/u ecg this AM similar to ECG from 1a.  I'm going to hold  blocker given brady/jxnl rhythm.  As it is now 48 hrs since last cialis dose, with intermittent c/p, will add nitropaste.  Cont heparin, asa, ARB, statin, zetia.  May need to consider urgent cath today pending f/u  ECG.  Otw, plan on cath tomorrow.  The patient understands that risks include but are not limited to stroke (1 in 1000), death (1 in 2), kidney failure [usually temporary] (1 in 500), bleeding (1 in 200), allergic reaction [possibly serious] (1 in 200), and agrees to proceed.   2. Essential HTN:  Has been stable.  Holding  blocker 2/2 brady/jxnl rhythm.  3.  HL:  On low-dose rosuva and zetia in setting of statin intolerance @ higher doses.  LDL 138.  May need PCSK9i as outpt.  4.  DMII:  SSI per IM.  5.  Hypokalemia:  Resolved.  6.  Junctional bradycardia:  Noted mostly overnight, but occurring while awake as well.  Has a h/o snoring.  Will need outpt sleep study.  Holding  blocker.  7.  Snores/sleep disordered breathing:  Will need outpt sleep eval.  Signed, Murray Hodgkins, NP  07/27/2020, 7:54 AM    For questions or updates, please contact   Please consult [email or web address] for contact info under Cardiology/STEMI.

## 2020-07-27 NOTE — Progress Notes (Signed)
[place] for heparin infusion Indication: chest pain/ACS  Allergies  Allergen Reactions  . Farxiga [Dapagliflozin]     weak  . Statins     Myalgias other than low dose  . Tetracycline     REACTION: Nervous,shakey    Patient Measurements: Height: 5\' 10"  (177.8 cm) Weight: 99.6 kg (219 lb 9.6 oz) IBW/kg (Calculated) : 73 Heparin Dosing Weight: 93.1 kg  Vital Signs: Temp: 98.8 F (37.1 C) (05/29 0116) Temp Source: Oral (05/28 2034) BP: 125/78 (05/29 0116) Pulse Rate: 59 (05/29 0116)  Labs: Recent Labs    07/25/20 1739 07/25/20 1739 07/25/20 1918 07/26/20 0143 07/26/20 0904 07/26/20 1616 07/27/20 0205  HGB 14.8  --   --  14.7  --   --  14.5  HCT 42.6  --   --  42.7  --   --  42.8  PLT 200  --   --  178  --   --  169  APTT 26  --   --   --   --   --   --   LABPROT 13.0  --   --   --   --   --   --   INR 1.0  --   --   --   --   --   --   HEPARINUNFRC  --    < >  --  0.22* 0.28* 0.35 0.33  CREATININE 1.26*  --   --  1.19  --   --   --   TROPONINIHS 168*  --  609*  --  44,034*  --   --    < > = values in this interval not displayed.    Estimated Creatinine Clearance: 82 mL/min (by C-G formula based on SCr of 1.19 mg/dL).   Medical History: Past Medical History:  Diagnosis Date  . Acid reflux   . Allergic asthma    rare flare up, worse with alllergies as of 12/2016  . Coronary artery disease    a. 08/1998 Lateral MI/PCI/BMS [place]; b. 2001 PTCA x 2 2/2 ISR in [place]; c. 2002 PCI/brachyrx 2/2 ISR in [place]; d. 01/2001 CABG x 4: LIMA->LAD, RIMA->RI, VG->RPL->RPDA; e. 01/2014 MV: Low risk w/ attenuation artifact and mild inf basal isch. Nl EF->Med rx.  Marland Kitchen DVT (deep venous thrombosis) ([place])    a. 01/2001 after CABG.  . Erectile dysfunction   . Hyperlipidemia LDL goal <70   . Hypertension   . Hypothyroidism   . Palpitations   . Pericardial effusion    a. 01/2001 Effusion w/ tamponade req window following CABG.  . Tobacco use    vape,  no cigarrette since 04/2015  . Type II diabetes mellitus (HCC)     Medications:  Per chart review and pharmacy technician confirmed with pt he is not on any anticoagulation  Assessment: 55 year old patient with a history of diabetes, hypertension, hypercholesterolemia and obesity presents for evaluation of chest pain. Pt has history of stent, CABG. Pharmacy has been consulted for heparin dosing and monitoring for ACS/STEMI.  HS trop 168. Baseline CBC, PT, INR, and aPTT WNL.   5/28:  HL @ 0143 = 0.22  1400 units IV bolus, increase rate to 1400 units/hr.  5/28:  HL @ 0904= 0.28  1400 units IV bolus, increase drip rate to 1600 units/hr.  5/28:  HL @ 1616= 0.35  Thera x1; continue at 1600 units/hr.  Goal of Therapy:  Heparin level 0.3-0.7 units/ml Monitor platelets  by anticoagulation protocol: Yes   Plan:  5/28:  HL @ 1616= 0.35  Thera x1; continue at 1600 units/hr.  Will get confirmation HL in 6 hrs after; then daily only if 2 consecutively therapeutic.   5/29: HL @ 0205 = 0.33, therapeutic X 2 . ( HL originally scheduled for 5/28 @ 2230 not drawn until 5/29 @ 0200. ) Will continue pt on current rate and recheck HL on 5/30 with AM labs.   Elijahjames Fuelling D, PharmD 07/27/2020 2:55 AM

## 2020-07-28 ENCOUNTER — Encounter
Admission: EM | Disposition: A | Discharge status: Home / Self Care | Payer: Self-pay | Source: Home / Self Care | Attending: Obstetrics and Gynecology

## 2020-07-28 ENCOUNTER — Inpatient Hospital Stay (HOSPITAL_COMMUNITY)
Admit: 2020-07-28 | Discharge: 2020-07-28 | Disposition: A | Discharge status: Home / Self Care | Payer: No Typology Code available for payment source | Attending: Cardiovascular Disease | Admitting: Cardiovascular Disease

## 2020-07-28 DIAGNOSIS — I25810 Atherosclerosis of coronary artery bypass graft(s) without angina pectoris: Secondary | ICD-10-CM | POA: Diagnosis not present

## 2020-07-28 DIAGNOSIS — I2510 Atherosclerotic heart disease of native coronary artery without angina pectoris: Secondary | ICD-10-CM | POA: Diagnosis not present

## 2020-07-28 DIAGNOSIS — I214 Non-ST elevation (NSTEMI) myocardial infarction: Secondary | ICD-10-CM

## 2020-07-28 HISTORY — PX: LEFT HEART CATH AND CORS/GRAFTS ANGIOGRAPHY: CATH118250

## 2020-07-28 LAB — CBC
HCT: 43.9 % (ref 39.0–52.0)
Hemoglobin: 15.1 g/dL (ref 13.0–17.0)
MCH: 29.2 pg (ref 26.0–34.0)
MCHC: 34.4 g/dL (ref 30.0–36.0)
MCV: 84.7 fL (ref 80.0–100.0)
Platelets: 160 10*3/uL (ref 150–400)
RBC: 5.18 MIL/uL (ref 4.22–5.81)
RDW: 12.6 % (ref 11.5–15.5)
WBC: 8.1 10*3/uL (ref 4.0–10.5)
nRBC: 0 % (ref 0.0–0.2)

## 2020-07-28 LAB — ECHOCARDIOGRAM COMPLETE
AR max vel: 1.34 cm2
AV Peak grad: 6.2 mmHg
Ao pk vel: 1.24 m/s
Area-P 1/2: 2.73 cm2
Height: 70 in
S' Lateral: 3.48 cm
Single Plane A4C EF: 42.2 %
Weight: 3504 oz

## 2020-07-28 LAB — BASIC METABOLIC PANEL
Anion gap: 9 (ref 5–15)
BUN: 15 mg/dL (ref 6–20)
CO2: 22 mmol/L (ref 22–32)
Calcium: 9 mg/dL (ref 8.9–10.3)
Chloride: 109 mmol/L (ref 98–111)
Creatinine, Ser: 1.17 mg/dL (ref 0.61–1.24)
GFR, Estimated: >60 mL/min (ref >60)
Glucose, Bld: 157 mg/dL — ABNORMAL HIGH (ref 70–99)
Potassium: 4.1 mmol/L (ref 3.5–5.1)
Sodium: 140 mmol/L (ref 135–145)

## 2020-07-28 LAB — HEPARIN LEVEL (UNFRACTIONATED): Heparin Unfractionated: 0.19 IU/mL — ABNORMAL LOW (ref 0.30–0.70)

## 2020-07-28 LAB — GLUCOSE, CAPILLARY
Glucose-Capillary: 154 mg/dL — ABNORMAL HIGH (ref 70–99)
Glucose-Capillary: 174 mg/dL — ABNORMAL HIGH (ref 70–99)
Glucose-Capillary: 122 mg/dL — ABNORMAL HIGH (ref 70–99)
Glucose-Capillary: 201 mg/dL — ABNORMAL HIGH (ref 70–99)

## 2020-07-28 SURGERY — LEFT HEART CATH AND CORS/GRAFTS ANGIOGRAPHY
Anesthesia: Moderate Sedation

## 2020-07-28 MED ORDER — FENTANYL CITRATE (PF) 100 MCG/2ML IJ SOLN
INTRAMUSCULAR | Status: DC | PRN
  Administered 2020-07-28: 25 ug via INTRAVENOUS
  Administered 2020-07-28: 50 ug via INTRAVENOUS

## 2020-07-28 MED ORDER — HEPARIN (PORCINE) IN NACL 2000-0.9 UNIT/L-% IV SOLN
INTRAVENOUS | Status: DC | PRN
  Administered 2020-07-28: 1000 mL

## 2020-07-28 MED ORDER — SODIUM CHLORIDE 0.9 % IV SOLN: 250.0000 mL | INTRAVENOUS | Status: DC | PRN

## 2020-07-28 MED ORDER — HEPARIN (PORCINE) IN NACL 1000-0.9 UT/500ML-% IV SOLN
INTRAVENOUS | Status: AC
  Filled 2020-07-28: qty 1000

## 2020-07-28 MED ORDER — ASPIRIN 81 MG PO CHEW
81.0000 mg | CHEWABLE_TABLET | ORAL | Status: AC
  Administered 2020-07-28: 81 mg via ORAL

## 2020-07-28 MED ORDER — ENOXAPARIN SODIUM 40 MG/0.4ML IJ SOSY
40.0000 mg | PREFILLED_SYRINGE | INTRAMUSCULAR | Status: DC
  Administered 2020-07-28: 40 mg via SUBCUTANEOUS
  Filled 2020-07-28: qty 0.4

## 2020-07-28 MED ORDER — MIDAZOLAM HCL 2 MG/2ML IJ SOLN
INTRAMUSCULAR | Status: AC
  Filled 2020-07-28: qty 2

## 2020-07-28 MED ORDER — SODIUM CHLORIDE 0.9 % WEIGHT BASED INFUSION
3.0000 mL/kg/h | INTRAVENOUS | Status: DC
  Administered 2020-07-28: 3 mL/kg/h via INTRAVENOUS

## 2020-07-28 MED ORDER — PERFLUTREN LIPID MICROSPHERE
1.0000 mL | INTRAVENOUS | Status: AC | PRN
  Administered 2020-07-28: 5.5 mL via INTRAVENOUS
  Filled 2020-07-28: qty 10

## 2020-07-28 MED ORDER — CLOPIDOGREL BISULFATE 75 MG PO TABS
75.0000 mg | ORAL_TABLET | Freq: Every day | ORAL | Status: DC
  Administered 2020-07-29: 75 mg via ORAL
  Filled 2020-07-28: qty 1

## 2020-07-28 MED ORDER — CLOPIDOGREL BISULFATE 75 MG PO TABS
300.0000 mg | ORAL_TABLET | Freq: Once | ORAL | Status: AC
  Administered 2020-07-28: 300 mg via ORAL
  Filled 2020-07-28: qty 4

## 2020-07-28 MED ORDER — SODIUM CHLORIDE 0.9% FLUSH: 3.0000 mL | INTRAVENOUS | Status: DC | PRN

## 2020-07-28 MED ORDER — CANAGLIFLOZIN 100 MG PO TABS
100.0000 mg | ORAL_TABLET | Freq: Every day | ORAL | Status: DC
  Administered 2020-07-29: 100 mg via ORAL
  Filled 2020-07-28: qty 1

## 2020-07-28 MED ORDER — SODIUM CHLORIDE 0.9% FLUSH: 3.0000 mL | Freq: Two times a day (BID) | INTRAVENOUS | Status: DC

## 2020-07-28 MED ORDER — MIDAZOLAM HCL 2 MG/2ML IJ SOLN
INTRAMUSCULAR | Status: DC | PRN
  Administered 2020-07-28: 1 mg via INTRAVENOUS

## 2020-07-28 MED ORDER — FENTANYL CITRATE (PF) 100 MCG/2ML IJ SOLN
INTRAMUSCULAR | Status: AC
  Filled 2020-07-28: qty 2

## 2020-07-28 MED ORDER — IOHEXOL 300 MG/ML  SOLN
INTRAMUSCULAR | Status: DC | PRN
  Administered 2020-07-28: 88 mL

## 2020-07-28 MED ORDER — SODIUM CHLORIDE 0.9 % WEIGHT BASED INFUSION: 1.0000 mL/kg/h | INTRAVENOUS | Status: DC

## 2020-07-28 MED ORDER — ASPIRIN 81 MG PO CHEW
CHEWABLE_TABLET | ORAL | Status: AC
  Filled 2020-07-28: qty 1

## 2020-07-28 MED ORDER — CARVEDILOL 3.125 MG PO TABS
3.1250 mg | ORAL_TABLET | Freq: Two times a day (BID) | ORAL | Status: DC
  Administered 2020-07-28 – 2020-07-29 (×2): 3.125 mg via ORAL
  Filled 2020-07-28 (×2): qty 1

## 2020-07-28 MED ORDER — SODIUM CHLORIDE 0.9 % IV SOLN: INTRAVENOUS | Status: DC

## 2020-07-28 MED ORDER — LIDOCAINE HCL (PF) 1 % IJ SOLN
INTRAMUSCULAR | Status: AC
  Filled 2020-07-28: qty 30

## 2020-07-28 MED ORDER — LIDOCAINE HCL (PF) 1 % IJ SOLN
INTRAMUSCULAR | Status: DC | PRN
  Administered 2020-07-28: 10 mL

## 2020-07-28 SURGICAL SUPPLY — 17 items
CATH INFINITI 5 FR IM (CATHETERS) ×2 IMPLANT
CATH INFINITI 5FR JL4 (CATHETERS) ×2 IMPLANT
CATH INFINITI JR4 5F (CATHETERS) ×2 IMPLANT
DEVICE CLOSURE MYNXGRIP 5F (Vascular Products) ×2 IMPLANT
DRAPE BRACHIAL (DRAPES) ×2 IMPLANT
GLIDESHEATH SLEND SS 6F .021 (SHEATH) IMPLANT
GUIDEWIRE INQWIRE 1.5J.035X260 (WIRE) IMPLANT
INQWIRE 1.5J .035X260CM (WIRE)
KIT MICROPUNCTURE NIT STIFF (SHEATH) ×2 IMPLANT
NEEDLE PERC 18GX7CM (NEEDLE) IMPLANT
PACK CARDIAC CATH (CUSTOM PROCEDURE TRAY) ×2 IMPLANT
PROTECTION STATION PRESSURIZED (MISCELLANEOUS) ×2
SET ATX SIMPLICITY (MISCELLANEOUS) ×2 IMPLANT
SHEATH AVANTI 5FR X 11CM (SHEATH) ×2 IMPLANT
STATION PROTECTION PRESSURIZED (MISCELLANEOUS) ×1 IMPLANT
WIRE EMERALD 3MM-J .035X260CM (WIRE) ×2 IMPLANT
WIRE GUIDERIGHT .035X150 (WIRE) ×2 IMPLANT

## 2020-07-28 NOTE — Progress Notes (Signed)
Patient returned to Unit with RN Michelle from Cath Lab. Patient AOx4. Right groin insertion site examined with RN Michelle at bedside. Site clean, dry, intact without any complications. Gauze and clear film covering. Wife Jen at bedside. All needs met at this time. Plan to DC Tuesday after initiating Eliquis today.  

## 2020-07-28 NOTE — Plan of Care (Signed)

## 2020-07-28 NOTE — Progress Notes (Signed)
*  PRELIMINARY RESULTS* Echocardiogram 2D Echocardiogram has been performed. Definity IV Contrast used on this study.  Claretta Fraise 07/28/2020, 2:15 PM

## 2020-07-28 NOTE — Progress Notes (Signed)
Pain ambulated in room. Called RN TO room with complaints of chest pain 4 of 10. Asked for Nitro paste he had refused at ordered time. Nitro paste applied to right chest at 1255 by this RN

## 2020-07-28 NOTE — Interval H&P Note (Signed)
History and Physical Interval Note:  07/28/2020 8:52 AM  Todd Kelley  has presented today for surgery, with the diagnosis of nstemi.  The various methods of treatment have been discussed with the patient and family. After consideration of risks, benefits and other options for treatment, the patient has consented to  Procedure(s): LEFT HEART CATH AND CORS/GRAFTS ANGIOGRAPHY (N/A) as a surgical intervention.  The patient's history has been reviewed, patient examined, no change in status, stable for surgery.  I have reviewed the patient's chart and labs.  Questions were answered to the patient's satisfaction.     Kathlyn [place]

## 2020-07-28 NOTE — Progress Notes (Signed)
PROGRESS NOTE    Todd Kelley  HGD:[id number] DOB: [birth year] DOA: 07/25/2020 PCP: Carlena Hurl, PA-C  Outpatient Specialists: previous [facility] patient    Brief Narrative:   Todd Kelley is a 55 y.o. male with medical history significant for CAD with hx of prior stents and CABGx3 2002, with last cardiac evaluation with nuclear stress test 2015 with a fixed defect, DM, HTN, HLDhypothyroidism, on daily cialis for   erectile dysfunction who presents to the ED with a 1-week history of chest pain on exertion resolved with rest now becoming more constant and frequent  Pain was tightness and substernal area nonradiating and severe intensity, radiating to jaw, without associated nausea, vomiting or diaphoresis.  He denies cough, fever or chills, shortness of breath, lower extremity pain or swelling ED course: On arrival, afebrile, BP 183/100, pulse 61 respirations 18 with O2 sat 97% on room air.  First troponin 168.  Mild creatinine elevation of 1.26, potassium 3.3.  Otherwise blood work including CBC and BMP unremarkable   Assessment & Plan:   Principal Problem:   NSTEMI (non-ST elevated myocardial infarction) ([place]) Active Problems:   Coronary artery disease   S/P CABG (coronary artery bypass graft)   Essential hypertension   BPH (benign prostatic hyperplasia)   Controlled type 2 diabetes mellitus with complication, without long-term current use of insulin ([place])   #  NSTEMI /unstable angina CAD S/P pci with des, subsequent interventions, CABG 2002. Patient presenting with 2 days of typical chest pain troponin 168 > 19,000 > 11,000, EKG with subtle ST-T wave changes. LHC 5/30 showed multi-vessel disease, no culprit lesion, no intervention - Aspirin, crestor, plavix added - f/u TTE - coreg added - d/c tomorrow likely  Mild hypokalemia Resolved - monitor  Essential hypertension - Continue home irbesartan  HLD - Continue Crestor, says higher dose cause joint  pains  Hypothyroidism - Continue Synthroid   # t2dm Pt informed of this diagnosis today. Mild glucose elevations -Sliding scale insulin coverage - starting invokana given ascvd. Didn't tolerate farxiga in the past but willing to give invokana a try   DVT prophylaxis: lovenox Code Status: full Family Communication: wife updated @ bedside 5/30  Level of care: Progressive Cardiac Status is: Inpatient  Remains inpatient appropriate because:Inpatient level of care appropriate due to severity of illness   Dispo: The patient is from: Home              Anticipated d/c is to: Home              Patient currently is not medically stable to d/c.   Difficult to place patient No        Consultants:  cardiology  Procedures: none  Antimicrobials:  none    Subjective: No chest pain or dyspnea. Tolerate cath.  Objective: Vitals:   07/28/20 1000 07/28/20 1015 07/28/20 1030 07/28/20 1107  BP: 127/83 133/75 (!) 142/91 (!) 132/94  Pulse:  67 68 62  Resp: 16 17 15 17   Temp:    97.8 F (36.6 C)  TempSrc:      SpO2: 96% 95% 96% 98%  Weight:      Height:        Intake/Output Summary (Last 24 hours) at 07/28/2020 1135 Last data filed at 07/28/2020 1127 Gross per 24 hour  Intake 745.88 ml  Output 1300 ml  Net -554.12 ml   Filed Weights   07/25/20 1720 07/25/20 2245 07/28/20 0752  Weight: 97.5 kg 99.6 kg 99.3  kg    Examination:  General exam: Appears calm and comfortable  Respiratory system: Clear to auscultation. Respiratory effort normal. Cardiovascular system: S1 & S2 heard, RRR. No JVD, murmurs, rubs, gallops or clicks. No pedal edema. Gastrointestinal system: Abdomen is nondistended, soft and nontender. No organomegaly or masses felt. Normal bowel sounds heard. Central nervous system: Alert and oriented. No focal neurological deficits. Extremities: Symmetric 5 x 5 power. Skin: No rashes, lesions or ulcers Psychiatry: Judgement and insight appear normal. Mood &  affect appropriate.     Data Reviewed: I have personally reviewed following labs and imaging studies  CBC: Recent Labs  Lab 07/25/20 1739 07/26/20 0143 07/27/20 0205 07/27/20 0542 07/28/20 0717  WBC 10.1 8.0 8.9 8.2 8.1  NEUTROABS  --   --   --  5.1  --   HGB 14.8 14.7 14.5 14.7 15.1  HCT 42.6 42.7 42.8 43.5 43.9  MCV 84.7 85.7 85.3 86.7 84.7  PLT 200 178 169 157 563   Basic Metabolic Panel: Recent Labs  Lab 07/25/20 1739 07/26/20 0143 07/26/20 0904 07/27/20 0205 07/27/20 0542 07/28/20 0717  NA 138 140  --   --  139 140  K 3.3* 3.3*  --   --  4.0 4.1  CL 105 106  --   --  109 109  CO2 23 26  --   --  24 22  GLUCOSE 151* 204*  --   --  155* 157*  BUN 13 12  --   --  13 15  CREATININE 1.26* 1.19  --   --  1.04 1.17  CALCIUM 9.9 9.2  --   --  9.0 9.0  MG  --   --  2.0 1.9  --   --    GFR: Estimated Creatinine Clearance: 83.3 mL/min (by C-G formula based on SCr of 1.17 mg/dL). Liver Function Tests: Recent Labs  Lab 07/27/20 0542  AST 63*  ALT 32  ALKPHOS 45  BILITOT 0.9  PROT 6.9  ALBUMIN 4.0   No results for input(s): LIPASE, AMYLASE in the last 168 hours. No results for input(s): AMMONIA in the last 168 hours. Coagulation Profile: Recent Labs  Lab 07/25/20 1739  INR 1.0   Cardiac Enzymes: No results for input(s): CKTOTAL, CKMB, CKMBINDEX, TROPONINI in the last 168 hours. BNP (last 3 results) No results for input(s): PROBNP in the last 8760 hours. HbA1C: No results for input(s): HGBA1C in the last 72 hours. CBG: Recent Labs  Lab 07/27/20 1600 07/27/20 2017 07/28/20 0738 07/28/20 0802 07/28/20 1109  GLUCAP 109* 212* 174* 154* 122*   Lipid Profile: Recent Labs    07/26/20 0143  CHOL 207*  HDL 41  LDLCALC 138*  TRIG 141  CHOLHDL 5.0   Thyroid Function Tests: No results for input(s): TSH, T4TOTAL, FREET4, T3FREE, THYROIDAB in the last 72 hours. Anemia Panel: No results for input(s): VITAMINB12, FOLATE, FERRITIN, TIBC, IRON, RETICCTPCT  in the last 72 hours. Urine analysis:    Component Value Date/Time   COLORURINE YELLOW (A) 03/29/2018 1055   APPEARANCEUR CLEAR (A) 03/29/2018 1055   LABSPEC 1.015 03/14/2020 1529   PHURINE 6.0 03/29/2018 1055   GLUCOSEU NEGATIVE 03/29/2018 1055   HGBUR NEGATIVE 03/29/2018 1055   BILIRUBINUR negative 03/14/2020 1529   KETONESUR negative 03/14/2020 [place] 03/29/2018 1055   PROTEINUR negative 03/14/2020 1529   PROTEINUR NEGATIVE 03/29/2018 1055   NITRITE Negative 03/14/2020 1529   NITRITE NEGATIVE 03/29/2018 1055   LEUKOCYTESUR Negative 03/14/2020  1529   Sepsis Labs: @LABRCNTIP (procalcitonin:4,lacticidven:4)  ) Recent Results (from the past 240 hour(s))  SARS CORONAVIRUS 2 (TAT 6-24 HRS) Nasopharyngeal Nasopharyngeal Swab     Status: None   Collection Time: 07/25/20  5:45 PM   Specimen: Nasopharyngeal Swab  Result Value Ref Range Status   SARS Coronavirus 2 NEGATIVE NEGATIVE Final    Comment: (NOTE) SARS-CoV-2 target nucleic acids are NOT DETECTED.  The SARS-CoV-2 RNA is generally detectable in upper and lower respiratory specimens during the acute phase of infection. Negative results do not preclude SARS-CoV-2 infection, do not rule out co-infections with other pathogens, and should not be used as the sole basis for treatment or other patient management decisions. Negative results must be combined with clinical observations, patient history, and epidemiological information. The expected result is Negative.  Fact Sheet for Patients: SugarRoll.be  Fact Sheet for Healthcare Providers: https://www.woods-mathews.com/  This test is not yet approved or cleared by the [place] FDA and  has been authorized for detection and/or diagnosis of SARS-CoV-2 by FDA under an Emergency Use Authorization (EUA). This EUA will remain  in effect (meaning this test can be used) for the duration of the COVID-19 declaration under  Se ction 564(b)(1) of the Act, 21 U.S.C. section 360bbb-3(b)(1), unless the authorization is terminated or revoked sooner.  Performed at [facility], [place] [address] [place], [place] [address]          Radiology Studies: CARDIAC CATHETERIZATION  Result Date: 07/28/2020  Ramus lesion is 100% stenosed.  Ost Cx to Prox Cx lesion is 85% stenosed.  Prox LAD to Mid LAD lesion is 100% stenosed.  1st Diag lesion is 80% stenosed.  There is moderate left ventricular systolic dysfunction.  The left ventricular ejection fraction is 35-45% by visual estimate.  LV end diastolic pressure is normal.  Origin to Prox Graft lesion is 50% stenosed.  RPDA lesion is 60% stenosed.  RIMA graft was visualized by angiography and is normal in caliber.  LIMA graft was visualized by angiography and is normal in caliber.  The graft exhibits no disease.  Mid LAD lesion is 85% stenosed.  1.  Severe underlying three-vessel coronary artery disease with patent grafts including LIMA to LAD, RIMA to ramus and SVG to right PDA.  There is significant mid to distal LAD stenosis proximal to the LIMA anastomosis, significant ostial left circumflex disease but the left circumflex distribution is relatively small.  There is moderate proximal disease affecting the SVG to right PDA. 2.  Moderately reduced LV systolic function with an EF of 35 to 40% with global hypokinesis. 3.  Normal left ventricular end-diastolic pressure. Recommendations: No clear culprit is identified for the patient's non-ST elevation myocardial infarction.  Vessel occlusion with spontaneous recanalization is a possibility especially in the SVG to right PDA. Discontinue heparin for now.  I elected to add clopidogrel for 1 year. Obtain an echocardiogram to better evaluate ejection fraction. I resumed small dose carvedilol given cardiomyopathy and PVCs.  Monitor for bradycardia. In the future if the patient has chronic anginal symptoms.  Nuclear stress  testing can be considered to see if there are areas of ischemia and guide revascularization if needed. Recommend keeping the patient 1 more day and possible discharge home tomorrow.        Scheduled Meds: . aspirin EC  81 mg Oral Daily  . [START ON 07/29/2020] canagliflozin  100 mg Oral QAC breakfast  . carvedilol  3.125 mg Oral BID WC  . clopidogrel  300 mg Oral Once  . [START ON 07/29/2020] clopidogrel  75 mg Oral Q breakfast  . escitalopram  10 mg Oral Daily  . ezetimibe  10 mg Oral Daily  . fenofibrate  160 mg Oral Daily  . insulin aspart  0-15 Units Subcutaneous TID WC  . insulin aspart  0-5 Units Subcutaneous QHS  . irbesartan  150 mg Oral Daily  . levothyroxine  150 mcg Oral QAC breakfast  . nitroGLYCERIN  1 inch Topical Q6H  . polyethylene glycol  17 g Oral Daily  . rosuvastatin  10 mg Oral QHS  . sodium chloride flush  3 mL Intravenous Q12H  . sodium chloride flush  3 mL Intravenous Q12H   Continuous Infusions: . sodium chloride 75 mL/hr at 07/28/20 1127  . sodium chloride       LOS: 3 days    Time spent: 25 min    Desma Maxim, MD [facility]   If 7PM-7AM, please contact night-coverage [email or web address] Password [email or web address] 07/28/2020, 11:35 AM

## 2020-07-29 ENCOUNTER — Other Ambulatory Visit: Payer: Self-pay | Admitting: Obstetrics and Gynecology

## 2020-07-29 ENCOUNTER — Telehealth: Payer: Self-pay | Admitting: *Deleted

## 2020-07-29 ENCOUNTER — Encounter: Payer: Self-pay | Admitting: Cardiovascular Disease

## 2020-07-29 ENCOUNTER — Telehealth: Payer: Self-pay | Admitting: Cardiovascular Disease

## 2020-07-29 DIAGNOSIS — I214 Non-ST elevation (NSTEMI) myocardial infarction: Secondary | ICD-10-CM | POA: Diagnosis not present

## 2020-07-29 DIAGNOSIS — I255 Ischemic cardiomyopathy: Secondary | ICD-10-CM

## 2020-07-29 DIAGNOSIS — I5020 Unspecified systolic (congestive) heart failure: Secondary | ICD-10-CM | POA: Diagnosis not present

## 2020-07-29 DIAGNOSIS — E1159 Type 2 diabetes mellitus with other circulatory complications: Secondary | ICD-10-CM

## 2020-07-29 LAB — HEMOGLOBIN A1C
Hgb A1c MFr Bld: 7.4 % — ABNORMAL HIGH (ref 4.8–5.6)
Mean Plasma Glucose: 166 mg/dL

## 2020-07-29 LAB — BASIC METABOLIC PANEL
Anion gap: 9 (ref 5–15)
BUN: 15 mg/dL (ref 6–20)
CO2: 24 mmol/L (ref 22–32)
Calcium: 9 mg/dL (ref 8.9–10.3)
Chloride: 105 mmol/L (ref 98–111)
Creatinine, Ser: 1.19 mg/dL (ref 0.61–1.24)
GFR, Estimated: >60 mL/min (ref >60)
Glucose, Bld: 164 mg/dL — ABNORMAL HIGH (ref 70–99)
Potassium: 3.8 mmol/L (ref 3.5–5.1)
Sodium: 138 mmol/L (ref 135–145)

## 2020-07-29 LAB — GLUCOSE, CAPILLARY
Glucose-Capillary: 157 mg/dL — ABNORMAL HIGH (ref 70–99)
Glucose-Capillary: 162 mg/dL — ABNORMAL HIGH (ref 70–99)

## 2020-07-29 MED ORDER — CARVEDILOL 3.125 MG PO TABS: 3.1250 mg | ORAL_TABLET | Freq: Two times a day (BID) | ORAL | 1 refills | Status: DC

## 2020-07-29 MED ORDER — ATORVASTATIN CALCIUM 10 MG PO TABS: 10.0000 mg | ORAL_TABLET | Freq: Every day | ORAL | Status: DC

## 2020-07-29 MED ORDER — METFORMIN HCL ER 500 MG PO TB24: 500.0000 mg | ORAL_TABLET | Freq: Every day | ORAL | 11 refills | Status: AC

## 2020-07-29 MED ORDER — EZETIMIBE 10 MG PO TABS: 10.0000 mg | ORAL_TABLET | Freq: Every day | ORAL | 1 refills | Status: DC

## 2020-07-29 MED ORDER — CANAGLIFLOZIN 100 MG PO TABS: 100.0000 mg | ORAL_TABLET | Freq: Every day | ORAL | 1 refills | Status: DC

## 2020-07-29 MED ORDER — CLOPIDOGREL BISULFATE 75 MG PO TABS: 75.0000 mg | ORAL_TABLET | Freq: Every day | ORAL | 1 refills | Status: DC

## 2020-07-29 MED ORDER — IRBESARTAN 150 MG PO TABS: 150.0000 mg | ORAL_TABLET | Freq: Every day | ORAL | 1 refills | Status: DC

## 2020-07-29 MED ORDER — ATORVASTATIN CALCIUM 10 MG PO TABS: 10.0000 mg | ORAL_TABLET | Freq: Every day | ORAL | 1 refills | Status: DC

## 2020-07-29 NOTE — Telephone Encounter (Signed)
-----   Message from Nelva Bush, MD sent at 07/29/2020  8:32 AM EDT ----- Regarding: Hospital follow-up Good morning,  Mr. Lesesne will likely be d/c'ed today.  He was previously followed by Dr. Martinique in [place] but would like to transition his care to [facility] (now established with Dr. Rockey Situ).  Could you arrange for [facility] appointment with Dr. Rockey Situ or APP in ~2 weeks?  Thanks.  Gerald Stabs

## 2020-07-29 NOTE — Telephone Encounter (Signed)
TCM....  Patient is being discharged   They saw Rockey Situ  They are scheduled to see Mickle Plumb on 7/13  They were seen for Non-Stemi  They need to be seen within 2 weeks  Pt not placed wait list   Please call

## 2020-07-29 NOTE — Discharge Instructions (Signed)
To Whom it May Concern:  Please limit Todd Kelley (DOB 11/08/1965) to light duty (no lifting greater than 10 pounds or extended walking), preferably from home, until he has followed-up with his cardiologist. He was hospitalized 07/25/20-07/29/2020.   Sincerely,   Laurey Arrow, MD

## 2020-07-29 NOTE — Telephone Encounter (Signed)
The patient is currently admitted. Message routed to scheduling to please arrange for follow up.  Will review tomorrow for [facility] call.

## 2020-07-29 NOTE — Discharge Summary (Signed)
Todd Kelley IDP:[id number] DOB: 27-May-1965 DOA: 07/25/2020  PCP: Carlena Hurl, PA-C  Admit date: 07/25/2020 Discharge date: 07/29/2020  Time spent: 45 minutes  Recommendations for Outpatient Follow-up:  1. Cardiology f/u 2 weeks 2. pcp f/u     Discharge Diagnoses:  Principal Problem:   NSTEMI (non-ST elevated myocardial infarction) ([place]) Active Problems:   Coronary artery disease   S/P CABG (coronary artery bypass graft)   Essential hypertension   BPH (benign prostatic hyperplasia)   Controlled type 2 diabetes mellitus with complication, without long-term current use of insulin ([place])   Discharge Condition: stable  Diet recommendation: heart healthy  Filed Weights   07/25/20 2245 07/28/20 0752 07/29/20 0522  Weight: 99.6 kg 99.3 kg 97.6 kg    History of present illness:  Todd Shorey Jonesis a 55 y.o.malewith medical history significant forCAD with hx of prior stents and CABGx3 2002, with last cardiac evaluation with nuclear stress test 2015 with a fixed defect, DM, HTN, HLDhypothyroidism, on daily cialis for erectile dysfunction who presents to the ED with a 1-weekhistory of chest pain on exertion resolved with rest now becoming more constant and frequent Pain was tightness and substernal area nonradiating and severe intensity, radiating to jaw, without associated nausea, vomiting or diaphoresis. He denies cough, fever or chills, shortness of breath, lower extremity pain or swelling ED course: On arrival, afebrile, BP 183/100, pulse 61 respirations 18 with O2 sat 97% on room air. First troponin 168. Mild creatinine elevation of 1.26, potassium 3.3. Otherwise blood work including CBC and BMP unremarkable  Hospital Course:  #NSTEMI /unstable angina # HFrEF CADS/P pci with des, subsequent interventions, CABG2002. Patient presenting with 2 days of typical chest pain troponin 168 > 19,000 > 11,000, EKG with subtle ST-T wave changes. [place] 5/30 showed multi-vessel disease,  no culprit lesion, no intervention. Ef 35-40 on tte -Aspirin, crestor, plavix added - coreg added - cardiology f/u 2 wks  Mild hypokalemia Resolved - monitor  Essential hypertension -Continue home irbesartan  Hypothyroidism -Continue Synthroid  #t2dm Pt informed of this diagnosis today. Mild glucose elevations -Sliding scale insulin coverage - starting invokana given ascvd. Didn't tolerate farxiga in the past but willing to give invokana a try. Will also re-start metformin  Procedures: Left heart cath 07/28/20  Ramus lesion is 100% stenosed.  Ost Cx to Prox Cx lesion is 85% stenosed.  Prox LAD to Mid LAD lesion is 100% stenosed.  1st Diag lesion is 80% stenosed.  There is moderate left ventricular systolic dysfunction.  The left ventricular ejection fraction is 35-45% by visual estimate.  LV end diastolic pressure is normal.  Origin to Prox Graft lesion is 50% stenosed.  RPDA lesion is 60% stenosed.  RIMA graft was visualized by angiography and is normal in caliber.  LIMA graft was visualized by angiography and is normal in caliber.  The graft exhibits no disease.  Mid LAD lesion is 85% stenosed.   1.  Severe underlying three-vessel coronary artery disease with patent grafts including LIMA to LAD, RIMA to ramus and SVG to right PDA.  There is significant mid to distal LAD stenosis proximal to the LIMA anastomosis, significant ostial left circumflex disease but the left circumflex distribution is relatively small.  There is moderate proximal disease affecting the SVG to right PDA. 2.  Moderately reduced LV systolic function with an EF of 35 to 40% with global hypokinesis. 3.  Normal left ventricular end-diastolic pressure.  Recommendations: No clear culprit is identified for the patient's non-ST elevation  myocardial infarction.  Vessel occlusion with spontaneous recanalization is a possibility especially in the SVG to right PDA. Discontinue heparin for  now.  I elected to add clopidogrel for 1 year.   Consultations:  cardiology  Discharge Exam: Vitals:   07/29/20 0733 07/29/20 1148  BP: (!) 135/93 (!) 154/85  Pulse: 62 71  Resp: 18 18  Temp: 98.4 F (36.9 C) 98.1 F (36.7 C)  SpO2: 96% 98%    General exam: Appears calm and comfortable  Respiratory system: Clear to auscultation. Respiratory effort normal. Cardiovascular system: S1 & S2 heard, RRR. No JVD, murmurs, rubs, gallops or clicks. No pedal edema. Gastrointestinal system: Abdomen is nondistended, soft and nontender. No organomegaly or masses felt. Normal bowel sounds heard. Central nervous system: Alert and oriented. No focal neurological deficits. Extremities: Symmetric 5 x 5 power. Skin: No rashes, lesions or ulcers Psychiatry: Judgement and insight appear normal. Mood & affect appropriate.   Discharge Instructions   Discharge Instructions    AMB Referral to Cardiac Rehabilitation - Phase II   Complete by: As directed    Diagnosis: NSTEMI   After initial evaluation and assessments completed: Virtual Based Care may be provided alone or in conjunction with Phase 2 Cardiac Rehab based on patient barriers.: Yes   Diet - low sodium heart healthy   Complete by: As directed    Increase activity slowly   Complete by: As directed      Allergies as of 07/29/2020      Reactions   Farxiga [dapagliflozin]    weak   Statins    Myalgias other than low dose   Tetracycline    REACTION: Nervous,shakey      Medication List    STOP taking these medications   metFORMIN 500 MG tablet Commonly known as: GLUCOPHAGE Replaced by: metFORMIN 500 MG 24 hr tablet   rosuvastatin 20 MG tablet Commonly known as: CRESTOR     TAKE these medications   aspirin 81 MG EC tablet Commonly known as: Aspirin Low Dose Take 1 tablet (81 mg total) by mouth daily. Swallow whole.   atorvastatin 10 MG tablet Commonly known as: LIPITOR Take 1 tablet (10 mg total) by mouth daily. Start  taking on: July 30, 2020   canagliflozin 100 MG Tabs tablet Commonly known as: INVOKANA Take 1 tablet (100 mg total) by mouth daily before breakfast. Start taking on: July 30, 2020   carvedilol 3.125 MG tablet Commonly known as: COREG Take 1 tablet (3.125 mg total) by mouth 2 (two) times daily with a meal.   cetirizine 10 MG tablet Commonly known as: ZYRTEC Take 10 mg by mouth daily.   clopidogrel 75 MG tablet Commonly known as: PLAVIX Take 1 tablet (75 mg total) by mouth daily with breakfast. Start taking on: July 30, 2020   escitalopram 10 MG tablet Commonly known as: LEXAPRO Take 1 tablet (10 mg total) by mouth daily.   ezetimibe 10 MG tablet Commonly known as: ZETIA Take 1 tablet (10 mg total) by mouth daily. Start taking on: July 30, 2020   fenofibrate 145 MG tablet Commonly known as: TRICOR TAKE 1 TABLET BY MOUTH  DAILY   irbesartan 150 MG tablet Commonly known as: AVAPRO Take 1 tablet (150 mg total) by mouth daily.   levothyroxine 150 MCG tablet Commonly known as: Synthroid Take 1 tablet (150 mcg total) by mouth daily before breakfast.   metFORMIN 500 MG 24 hr tablet Commonly known as: Glucophage XR Take 1 tablet (500 mg total)  by mouth daily with breakfast. Replaces: metFORMIN 500 MG tablet   Multivitamin Adult Chew Chew 1 tablet by mouth daily.   ProAir RespiClick 737 (90 Base) MCG/ACT Aepb Generic drug: Albuterol Sulfate USE 2 INHALATIONS BY MOUTH  EVERY 6 HOURS AS NEEDED   tadalafil 5 MG tablet Commonly known as: CIALIS Take 1 tablet (5 mg total) by mouth daily.      Allergies  Allergen Reactions  . Farxiga [Dapagliflozin]     weak  . Statins     Myalgias other than low dose  . Tetracycline     REACTION: Nervous,shakey    Follow-up Information    End, Harrell Gave, MD. Schedule an appointment as soon as possible for a visit.   Specialty: Cardiology Contact information: [place] [address] [phone]         Carlena Hurl, PA-C. Schedule an appointment as soon as possible for a visit.   Specialty: Family Medicine Contact information: [place] [address] [phone]        Minna Merritts, MD .   Specialty: Cardiology Contact information: [place] [address] [phone]                The results of significant diagnostics from this hospitalization (including imaging, microbiology, ancillary and laboratory) are listed below for reference.    Significant Diagnostic Studies: CARDIAC CATHETERIZATION  Result Date: 07/28/2020  Ramus lesion is 100% stenosed.  Ost Cx to Prox Cx lesion is 85% stenosed.  Prox LAD to Mid LAD lesion is 100% stenosed.  1st Diag lesion is 80% stenosed.  There is moderate left ventricular systolic dysfunction.  The left ventricular ejection fraction is 35-45% by visual estimate.  LV end diastolic pressure is normal.  Origin to Prox Graft lesion is 50% stenosed.  RPDA lesion is 60% stenosed.  RIMA graft was visualized by angiography and is normal in caliber.  LIMA graft was visualized by angiography and is normal in caliber.  The graft exhibits no disease.  Mid LAD lesion is 85% stenosed.  1.  Severe underlying three-vessel coronary artery disease with patent grafts including LIMA to LAD, RIMA to ramus and SVG to right PDA.  There is significant mid to distal LAD stenosis proximal to the LIMA anastomosis, significant ostial left circumflex disease but the left circumflex distribution is relatively small.  There is moderate proximal disease affecting the SVG to right PDA. 2.  Moderately reduced LV systolic function with an EF of 35 to 40% with global hypokinesis. 3.  Normal left ventricular end-diastolic pressure. Recommendations: No clear culprit is identified for the patient's non-ST elevation myocardial infarction.  Vessel occlusion with spontaneous recanalization is a possibility especially in  the SVG to right PDA. Discontinue heparin for now.  I elected to add clopidogrel for 1 year. Obtain an echocardiogram to better evaluate ejection fraction. I resumed small dose carvedilol given cardiomyopathy and PVCs.  Monitor for bradycardia. In the future if the patient has chronic anginal symptoms.  Nuclear stress testing can be considered to see if there are areas of ischemia and guide revascularization if needed. Recommend keeping the patient 1 more day and possible discharge home tomorrow.   DG Chest Port 1 View  Result Date: 07/25/2020 CLINICAL DATA:  Chest pain on exertion EXAM: PORTABLE CHEST 1 VIEW COMPARISON:  Radiograph 06/19/2005 FINDINGS: Postsurgical changes related to prior CABG including intact and aligned sternotomy wires and multiple surgical clips projecting over  the mediastinum. The aorta is calcified. The remaining cardiomediastinal contours are unremarkable. No consolidation, features of edema, pneumothorax, or effusion. Pulmonary vascularity is normally distributed. No acute osseous or soft tissue abnormality. Telemetry leads overlie the chest. IMPRESSION: No acute cardiopulmonary abnormality. Prior sternotomy and CABG. Electronically Signed   By: Lovena Le M.D.   On: 07/25/2020 17:38   ECHOCARDIOGRAM COMPLETE  Result Date: 07/28/2020    ECHOCARDIOGRAM REPORT   Patient Name:   Todd Kelley Date of Exam: 07/28/2020 Medical Rec #:  [id number]    Height:       70.0 in Accession #:    [id number]   Weight:       219.0 lb Date of Birth:  1965-07-17     BSA:          2.169 m Patient Age:    58 years     BP:           132/94 mmHg Patient Gender: M            HR:           55 bpm. Exam Location:  [facility] Procedure: 2D Echo and Intracardiac Opacification Agent Indications:     AMI I21.9  History:         Patient has no prior history of Echocardiogram examinations.  Sonographer:     Kathlen Brunswick RDCS Referring Phys:  [id number] HCWCBJSE A ARIDA Diagnosing Phys: Ida Rogue MD IMPRESSIONS  1. Left  ventricular ejection fraction, by estimation, is 35 to 40%. The left ventricle has moderately decreased function. The left ventricle demonstrates regional wall motion abnormalities (Hypokinesis of the anterior and anteroseptal wall). There is moderate left ventricular hypertrophy. Left ventricular diastolic parameters are consistent with Grade I diastolic dysfunction (impaired relaxation).  2. Right ventricular systolic function is normal. The right ventricular size is normal.  3. Left atrial size was mildly dilated.  4. Frequent PVCs FINDINGS  Left Ventricle: Left ventricular ejection fraction, by estimation, is 35 to 40%. The left ventricle has moderately decreased function. The left ventricle demonstrates regional wall motion abnormalities. Definity contrast agent was given IV to delineate the left ventricular endocardial borders. The left ventricular internal cavity size was normal in size. There is moderate left ventricular hypertrophy. Left ventricular diastolic parameters are consistent with Grade I diastolic dysfunction (impaired relaxation). Right Ventricle: The right ventricular size is normal. No increase in right ventricular wall thickness. Right ventricular systolic function is normal. Left Atrium: Left atrial size was mildly dilated. Right Atrium: Right atrial size was normal in size. Pericardium: There is no evidence of pericardial effusion. Mitral Valve: The mitral valve is normal in structure. No evidence of mitral valve regurgitation. No evidence of mitral valve stenosis. Tricuspid Valve: The tricuspid valve is normal in structure. Tricuspid valve regurgitation is not demonstrated. No evidence of tricuspid stenosis. Aortic Valve: The aortic valve was not well visualized. Aortic valve regurgitation is not visualized. No aortic stenosis is present. Aortic valve peak gradient measures 6.2 mmHg. Pulmonic Valve: The pulmonic valve was normal in structure. Pulmonic valve regurgitation is not visualized.  No evidence of pulmonic stenosis. Aorta: The aortic root is normal in size and structure. Venous: The inferior vena cava is normal in size with greater than 50% respiratory variability, suggesting right atrial pressure of 3 mmHg. IAS/Shunts: No atrial level shunt detected by color flow Doppler.  LEFT VENTRICLE PLAX 2D LVIDd:         4.47 cm      Diastology  LVIDs:         3.48 cm      LV e' medial:    6.96 cm/s LV PW:         1.67 cm      LV E/e' medial:  9.3 LV IVS:        1.71 cm      LV e' lateral:   7.72 cm/s LVOT diam:     2.00 cm      LV E/e' lateral: 8.4 LV SV:         23 LV SV Index:   11 LVOT Area:     3.14 cm  LV Volumes (MOD) LV vol d, MOD A4C: 109.0 ml LV vol s, MOD A4C: 63.0 ml LV SV MOD A4C:     109.0 ml RIGHT VENTRICLE RV Basal diam:  2.72 cm RV S prime:     11.60 cm/s TAPSE (M-mode): 1.7 cm LEFT ATRIUM             Index       RIGHT ATRIUM           Index LA diam:        4.20 cm 1.94 cm/m  RA Area:     10.50 cm LA Vol (A2C):   35.6 ml 16.41 ml/m RA Volume:   21.90 ml  10.09 ml/m LA Vol (A4C):   47.6 ml 21.94 ml/m LA Biplane Vol: 44.5 ml 20.51 ml/m  AORTIC VALVE                PULMONIC VALVE AV Area (Vmax): 1.34 cm    PV Vmax:       1.30 m/s AV Vmax:        124.00 cm/s PV Peak grad:  6.8 mmHg AV Peak Grad:   6.2 mmHg LVOT Vmax:      52.90 cm/s LVOT Vmean:     35.900 cm/s LVOT VTI:       0.075 m  AORTA Ao Root diam: 3.10 cm Ao Asc diam:  3.00 cm MITRAL VALVE MV Area (PHT): 2.73 cm    SHUNTS MV Decel Time: 278 msec    Systemic VTI:  0.07 m MV E velocity: 64.70 cm/s  Systemic Diam: 2.00 cm Ida Rogue MD Electronically signed by Ida Rogue MD Signature Date/Time: 07/28/2020/11:07:46 PM    Final     Microbiology: Recent Results (from the past 240 hour(s))  SARS CORONAVIRUS 2 (TAT 6-24 HRS) Nasopharyngeal Nasopharyngeal Swab     Status: None   Collection Time: 07/25/20  5:45 PM   Specimen: Nasopharyngeal Swab  Result Value Ref Range Status   SARS Coronavirus 2 NEGATIVE NEGATIVE Final     Comment: (NOTE) SARS-CoV-2 target nucleic acids are NOT DETECTED.  The SARS-CoV-2 RNA is generally detectable in upper and lower respiratory specimens during the acute phase of infection. Negative results do not preclude SARS-CoV-2 infection, do not rule out co-infections with other pathogens, and should not be used as the sole basis for treatment or other patient management decisions. Negative results must be combined with clinical observations, patient history, and epidemiological information. The expected result is Negative.  Fact Sheet for Patients: SugarRoll.be  Fact Sheet for Healthcare Providers: https://www.woods-mathews.com/  This test is not yet approved or cleared by the [place] FDA and  has been authorized for detection and/or diagnosis of SARS-CoV-2 by FDA under an Emergency Use Authorization (EUA). This EUA will remain  in effect (meaning this test can be used) for the  duration of the COVID-19 declaration under Se ction 564(b)(1) of the Act, 21 U.S.C. section 360bbb-3(b)(1), unless the authorization is terminated or revoked sooner.  Performed at [facility], [place] [address] [place], [place] [address]      Labs: Basic Metabolic Panel: Recent Labs  Lab 07/25/20 1739 07/26/20 0143 07/26/20 8675 07/27/20 0205 07/27/20 0542 07/28/20 0717 07/29/20 0624  NA 138 140  --   --  139 140 138  K 3.3* 3.3*  --   --  4.0 4.1 3.8  CL 105 106  --   --  109 109 105  CO2 23 26  --   --  24 22 24   GLUCOSE 151* 204*  --   --  155* 157* 164*  BUN 13 12  --   --  13 15 15   CREATININE 1.26* 1.19  --   --  1.04 1.17 1.19  CALCIUM 9.9 9.2  --   --  9.0 9.0 9.0  MG  --   --  2.0 1.9  --   --   --    Liver Function Tests: Recent Labs  Lab 07/27/20 0542  AST 63*  ALT 32  ALKPHOS 45  BILITOT 0.9  PROT 6.9  ALBUMIN 4.0   No results for input(s): LIPASE, AMYLASE in the last 168 hours. No results for input(s):  AMMONIA in the last 168 hours. CBC: Recent Labs  Lab 07/25/20 1739 07/26/20 0143 07/27/20 0205 07/27/20 0542 07/28/20 0717  WBC 10.1 8.0 8.9 8.2 8.1  NEUTROABS  --   --   --  5.1  --   HGB 14.8 14.7 14.5 14.7 15.1  HCT 42.6 42.7 42.8 43.5 43.9  MCV 84.7 85.7 85.3 86.7 84.7  PLT 200 178 169 157 160   Cardiac Enzymes: No results for input(s): CKTOTAL, CKMB, CKMBINDEX, TROPONINI in the last 168 hours. BNP: BNP (last 3 results) No results for input(s): BNP in the last 8760 hours.  ProBNP (last 3 results) No results for input(s): PROBNP in the last 8760 hours.  CBG: Recent Labs  Lab 07/28/20 0802 07/28/20 1109 07/28/20 2019 07/29/20 0732 07/29/20 1146  GLUCAP 154* 122* 201* 157* 162*       Signed:  Desma Maxim MD.  [facility] 07/29/2020, 12:35 PM

## 2020-07-29 NOTE — Telephone Encounter (Signed)
Patient calling  States that [facility] in [address] states he needs a prior auth for canagliflozin (INVOKANA) 100 MG  If we do not prescribe patient would like to know who to contact  Please call to discuss

## 2020-07-29 NOTE — Progress Notes (Signed)
Discharge instructions explained/pt and wife verbalized understanding. IV and Tele removed. Will transport off unit via wheelchair.

## 2020-07-29 NOTE — Progress Notes (Signed)
Progress Note  Patient Name: Todd Kelley Date of Encounter: 07/29/2020  [facility] Cardiologist: Ida Rogue, MD   Subjective   Feels well this AM.  He had an episode of chest/jaw pain yesterday following cath when ambulating in his room.  This resolved with SL NTG.  He has done similar activity in his room since then without recurrence.  No significant dyspnea or palpitations.  Soreness noted in right groin with coughing only.  Inpatient Medications    Scheduled Meds: . aspirin EC  81 mg Oral Daily  . canagliflozin  100 mg Oral QAC breakfast  . carvedilol  3.125 mg Oral BID WC  . clopidogrel  75 mg Oral Q breakfast  . enoxaparin (LOVENOX) injection  40 mg Subcutaneous Q24H  . escitalopram  10 mg Oral Daily  . ezetimibe  10 mg Oral Daily  . fenofibrate  160 mg Oral Daily  . insulin aspart  0-15 Units Subcutaneous TID WC  . insulin aspart  0-5 Units Subcutaneous QHS  . irbesartan  150 mg Oral Daily  . levothyroxine  150 mcg Oral QAC breakfast  . nitroGLYCERIN  1 inch Topical Q6H  . polyethylene glycol  17 g Oral Daily  . rosuvastatin  10 mg Oral QHS  . sodium chloride flush  3 mL Intravenous Q12H   Continuous Infusions: . sodium chloride     PRN Meds: sodium chloride, acetaminophen, ALPRAZolam, morphine injection, ondansetron (ZOFRAN) IV   Vital Signs    Vitals:   07/28/20 2330 07/29/20 0518 07/29/20 0522 07/29/20 0733  BP: (!) 127/97 (!) 126/95  (!) 135/93  Pulse: (!) 56 60  62  Resp: 17 16  18   Temp: 98.2 F (36.8 C) 99.1 F (37.3 C)  98.4 F (36.9 C)  TempSrc:      SpO2: 94% 96%  96%  Weight:   97.6 kg   Height:        Intake/Output Summary (Last 24 hours) at 07/29/2020 0819 Last data filed at 07/29/2020 0525 Gross per 24 hour  Intake 1465.88 ml  Output 1000 ml  Net 465.88 ml   Last 3 Weights 07/29/2020 07/28/2020 07/25/2020  Weight (lbs) 215 lb 3.2 oz 219 lb 219 lb 9.6 oz  Weight (kg) 97.614 kg 99.338 kg 99.61 kg      Telemetry    NSR with  PVC's - Personally Reviewed  ECG    No new tracing.  Physical Exam   GEN: No acute distress.   Neck: No JVD Cardiac: RRR, no murmurs, rubs, or gallops. Right groin covered with clean dressing.  No hematoma or bruit. Respiratory: Clear to auscultation bilaterally. GI: Soft, nontender, non-distended  MS: No edema; No deformity. Neuro:  Nonfocal  Psych: Normal affect   Labs    High Sensitivity Troponin:   Recent Labs  Lab 07/25/20 1739 07/25/20 1918 07/26/20 0904 07/27/20 0542  TROPONINIHS 168* 609* 19,627* 11,933*      Chemistry Recent Labs  Lab 07/27/20 0542 07/28/20 0717 07/29/20 0624  NA 139 140 138  K 4.0 4.1 3.8  CL 109 109 105  CO2 24 22 24   GLUCOSE 155* 157* 164*  BUN 13 15 15   CREATININE 1.04 1.17 1.19  CALCIUM 9.0 9.0 9.0  PROT 6.9  --   --   ALBUMIN 4.0  --   --   AST 63*  --   --   ALT 32  --   --   ALKPHOS 45  --   --  BILITOT 0.9  --   --   GFRNONAA >60 >60 >60  ANIONGAP 6 9 9      Hematology Recent Labs  Lab 07/27/20 0205 07/27/20 0542 07/28/20 0717  WBC 8.9 8.2 8.1  RBC 5.02 5.02 5.18  HGB 14.5 14.7 15.1  HCT 42.8 43.5 43.9  MCV 85.3 86.7 84.7  MCH 28.9 29.3 29.2  MCHC 33.9 33.8 34.4  RDW 12.6 12.8 12.6  PLT 169 157 160    BNPNo results for input(s): BNP, PROBNP in the last 168 hours.   DDimer No results for input(s): DDIMER in the last 168 hours.   Radiology    CARDIAC CATHETERIZATION  Result Date: 07/28/2020  Ramus lesion is 100% stenosed.  Ost Cx to Prox Cx lesion is 85% stenosed.  Prox LAD to Mid LAD lesion is 100% stenosed.  1st Diag lesion is 80% stenosed.  There is moderate left ventricular systolic dysfunction.  The left ventricular ejection fraction is 35-45% by visual estimate.  LV Joshlynn Alfonzo diastolic pressure is normal.  Origin to Prox Graft lesion is 50% stenosed.  RPDA lesion is 60% stenosed.  RIMA graft was visualized by angiography and is normal in caliber.  LIMA graft was visualized by angiography and is  normal in caliber.  The graft exhibits no disease.  Mid LAD lesion is 85% stenosed.  1.  Severe underlying three-vessel coronary artery disease with patent grafts including LIMA to LAD, RIMA to ramus and SVG to right PDA.  There is significant mid to distal LAD stenosis proximal to the LIMA anastomosis, significant ostial left circumflex disease but the left circumflex distribution is relatively small.  There is moderate proximal disease affecting the SVG to right PDA. 2.  Moderately reduced LV systolic function with an EF of 35 to 40% with global hypokinesis. 3.  Normal left ventricular Daliya Parchment-diastolic pressure. Recommendations: No clear culprit is identified for the patient's non-ST elevation myocardial infarction.  Vessel occlusion with spontaneous recanalization is a possibility especially in the SVG to right PDA. Discontinue heparin for now.  I elected to add clopidogrel for 1 year. Obtain an echocardiogram to better evaluate ejection fraction. I resumed small dose carvedilol given cardiomyopathy and PVCs.  Monitor for bradycardia. In the future if the patient has chronic anginal symptoms.  Nuclear stress testing can be considered to see if there are areas of ischemia and guide revascularization if needed. Recommend keeping the patient 1 more day and possible discharge home tomorrow.   ECHOCARDIOGRAM COMPLETE  Result Date: 07/28/2020    ECHOCARDIOGRAM REPORT   Patient Name:   Todd Kelley Date of Exam: 07/28/2020 Medical Rec #:  [id number]    Height:       70.0 in Accession #:    [id number]   Weight:       219.0 lb Date of Birth:  June 01, 1965     BSA:          2.169 m Patient Age:    55 years     BP:           132/94 mmHg Patient Gender: M            HR:           55 bpm. Exam Location:  [facility] Procedure: 2D Echo and Intracardiac Opacification Agent Indications:     AMI I21.9  History:         Patient has no prior history of Echocardiogram examinations.  Sonographer:     Kathlen Brunswick RDCS Referring Phys:  [place]: Ida Rogue MD IMPRESSIONS  1. Left ventricular ejection fraction, by estimation, is 35 to 40%. The left ventricle has moderately decreased function. The left ventricle demonstrates regional wall motion abnormalities (Hypokinesis of the anterior and anteroseptal wall). There is moderate left ventricular hypertrophy. Left ventricular diastolic parameters are consistent with Grade I diastolic dysfunction (impaired relaxation).  2. Right ventricular systolic function is normal. The right ventricular size is normal.  3. Left atrial size was mildly dilated.  4. Frequent PVCs FINDINGS  Left Ventricle: Left ventricular ejection fraction, by estimation, is 35 to 40%. The left ventricle has moderately decreased function. The left ventricle demonstrates regional wall motion abnormalities. Definity contrast agent was given IV to delineate the left ventricular endocardial borders. The left ventricular internal cavity size was normal in size. There is moderate left ventricular hypertrophy. Left ventricular diastolic parameters are consistent with Grade I diastolic dysfunction (impaired relaxation). Right Ventricle: The right ventricular size is normal. No increase in right ventricular wall thickness. Right ventricular systolic function is normal. Left Atrium: Left atrial size was mildly dilated. Right Atrium: Right atrial size was normal in size. Pericardium: There is no evidence of pericardial effusion. Mitral Valve: The mitral valve is normal in structure. No evidence of mitral valve regurgitation. No evidence of mitral valve stenosis. Tricuspid Valve: The tricuspid valve is normal in structure. Tricuspid valve regurgitation is not demonstrated. No evidence of tricuspid stenosis. Aortic Valve: The aortic valve was not well visualized. Aortic valve regurgitation is not visualized. No aortic stenosis is present. Aortic valve peak gradient measures 6.2 mmHg. Pulmonic Valve: The pulmonic  valve was normal in structure. Pulmonic valve regurgitation is not visualized. No evidence of pulmonic stenosis. Aorta: The aortic root is normal in size and structure. Venous: The inferior vena cava is normal in size with greater than 50% respiratory variability, suggesting right atrial pressure of 3 mmHg. IAS/Shunts: No atrial level shunt detected by color flow Doppler.  LEFT VENTRICLE PLAX 2D LVIDd:         4.47 cm      Diastology LVIDs:         3.48 cm      LV e' medial:    6.96 cm/s LV PW:         1.67 cm      LV E/e' medial:  9.3 LV IVS:        1.71 cm      LV e' lateral:   7.72 cm/s LVOT diam:     2.00 cm      LV E/e' lateral: 8.4 LV SV:         23 LV SV Index:   11 LVOT Area:     3.14 cm  LV Volumes (MOD) LV vol d, MOD A4C: 109.0 ml LV vol s, MOD A4C: 63.0 ml LV SV MOD A4C:     109.0 ml RIGHT VENTRICLE RV Basal diam:  2.72 cm RV S prime:     11.60 cm/s TAPSE (M-mode): 1.7 cm LEFT ATRIUM             Index       RIGHT ATRIUM           Index LA diam:        4.20 cm 1.94 cm/m  RA Area:     10.50 cm LA Vol (A2C):   35.6 ml 16.41 ml/m RA Volume:   21.90 ml  10.09 ml/m LA Vol (A4C):   47.6 ml  21.94 ml/m LA Biplane Vol: 44.5 ml 20.51 ml/m  AORTIC VALVE                PULMONIC VALVE AV Area (Vmax): 1.34 cm    PV Vmax:       1.30 m/s AV Vmax:        124.00 cm/s PV Peak grad:  6.8 mmHg AV Peak Grad:   6.2 mmHg LVOT Vmax:      52.90 cm/s LVOT Vmean:     35.900 cm/s LVOT VTI:       0.075 m  AORTA Ao Root diam: 3.10 cm Ao Asc diam:  3.00 cm MITRAL VALVE MV Area (PHT): 2.73 cm    SHUNTS MV Decel Time: 278 msec    Systemic VTI:  0.07 m MV E velocity: 64.70 cm/s  Systemic Diam: 2.00 cm Ida Rogue MD Electronically signed by Ida Rogue MD Signature Date/Time: 07/28/2020/11:07:46 PM    Final     Cardiac Studies   TTE (07/28/2020): 1. Left ventricular ejection fraction, by estimation, is 35 to 40%. The  left ventricle has moderately decreased function. The left ventricle  demonstrates regional wall  motion abnormalities (Hypokinesis of the  anterior and anteroseptal wall). There is  moderate left ventricular hypertrophy. Left ventricular diastolic  parameters are consistent with Grade I diastolic dysfunction (impaired  relaxation).  2. Right ventricular systolic function is normal. The right ventricular  size is normal.  3. Left atrial size was mildly dilated.  4. Frequent PVCs   LHC (07/28/2020): 1.  Severe underlying three-vessel coronary artery disease with patent grafts including LIMA to LAD, RIMA to ramus and SVG to right PDA.  There is significant mid to distal LAD stenosis proximal to the LIMA anastomosis, significant ostial left circumflex disease but the left circumflex distribution is relatively small.  There is moderate proximal disease affecting the SVG to right PDA. 2.  Moderately reduced LV systolic function with an EF of 35 to 40% with global hypokinesis. 3.  Normal left ventricular Faylinn Schwenn-diastolic pressure.   Patient Profile     55 y.o. male with a history of CAD s/p prior lateral MI (2000) and CABG x 4 in 06/3974 w/ complicated post-op course including tamponade req window & DVT, HTN, HL, DMII, tob/vape use, GERD, hypothyroidism, palpitations, and ED, who was admitted 5/27 w/ chest pain and NSTEMI.  Assessment & Plan    NSTEMI: Patient s/p cath yesterday showing severe native CAD and patent bypass grafts.  Culprit for NSTEMI not obvious (? Spontaneous recanalization or small branch occlusion).  Patient with chest pain following cath yesterday that resolved with SL NTG and has not recurred.  He is on tadalafil daily at home for ED and BPH, which precludes nitrate use at home at this time.  He is currently on NTG paste.  Continue DAPT with ASA and clopidogrel for at least 12 months.  Aggressive secondary prevention (see lipid therapy below).  If patient can ambulate without recurrent angina today, I think he would be safe for d/c home and f/u in our office in 2 weeks.   I advised him to stay out of work until seen for f/u.  D/c NTG paste.  If he has recurrent angina, favor adding ranolazine 500 mg BID as PDE5 inhibitor use precludes adding long-acting nitrate.  I advised the patient to speak with his urologist about transitioning to alternative therapy for management of his ED/BPH that would not be contraindicated with nitrate use.  HFrEF and ischemic cardiomyopathy: LVEF  35-40% by echo yesterday.  Patient appears euvolemic.  Continue carvedilol 3.125 mg BID; borderline low resting heart rate precludes further dose escalation.  Continue irbesartan 150 mg daily.  Consider transition to [facility] in the future if LVEF does not improve.  Hyperlipidemia: Patient intolerant of high-intensity statin therapy and reports myalgias even with rosuvastatin 10 mg daily.  He would prefer trying atorvastatin 10 mg daily, as he feels like he tolerated this better.  Switch rosuvastatin 10 mg daily -> atorvastatin 10 mg daily.  Continue ezetimibe 10 mg daily.  Consider PCSK9 inhibitor as an outpatient with lipids not at goal (LDL < 70).  Hypertension: BP normal to mildly elevated.  Continue current doses of irbesartan and carvedilol.    Type 2 diabetes mellitus: Hemoglobin A1c 7.1 in 03/2020.  Per IM.  Continue SGLT2 inhibitor in the setting of HFrEF.  For questions or updates, please contact [place] Please consult [email or web address] for contact info under [facility] Cardiology.     Signed, Nelva Bush, MD  07/29/2020, 8:19 AM

## 2020-07-30 ENCOUNTER — Telehealth: Payer: Self-pay

## 2020-07-30 NOTE — Telephone Encounter (Signed)
1st TCM attempt:  Patient contacted regarding discharge from [facility] on Tuesday 07/29/20.  Patient understands to follow up with provider Marrianne Mood, PA on Monday 08/11/20 at 0830 at [place] office. Patient understands discharge instructions? Yes Patient understands medications and regiment? Yes Patient understands to bring all medications to this visit? Yes  Ask patient:  Are you enrolled in My Chart- Yes

## 2020-07-30 NOTE — Telephone Encounter (Signed)
Spoke with patient and advised him that Todd Kelley is not a cardiac medication therefore he would need to contact Dr. Marcia Brash office to get them to initiate the prior authorization. Patient expressed verbal understanding and states he will discuss with PCP at his scheduled appointment tomorrow.

## 2020-07-30 NOTE — Telephone Encounter (Signed)
I called the pt. Per my TOC report and got him scheduled for a hospital f.u here on 07/31/20. His medications were gone over and reconciled.

## 2020-07-31 ENCOUNTER — Telehealth: Payer: Self-pay | Admitting: Medical

## 2020-07-31 ENCOUNTER — Ambulatory Visit (INDEPENDENT_AMBULATORY_CARE_PROVIDER_SITE_OTHER): Payer: No Typology Code available for payment source | Admitting: Medical

## 2020-07-31 ENCOUNTER — Other Ambulatory Visit: Payer: Self-pay

## 2020-07-31 ENCOUNTER — Encounter: Payer: Self-pay | Admitting: Medical

## 2020-07-31 VITALS — BP 120/88 | HR 56 | Temp 98.5°F | Ht 70.0 in | Wt 219.4 lb

## 2020-07-31 DIAGNOSIS — E118 Type 2 diabetes mellitus with unspecified complications: Secondary | ICD-10-CM | POA: Diagnosis not present

## 2020-07-31 DIAGNOSIS — I214 Non-ST elevation (NSTEMI) myocardial infarction: Secondary | ICD-10-CM | POA: Diagnosis not present

## 2020-07-31 DIAGNOSIS — I10 Essential (primary) hypertension: Secondary | ICD-10-CM

## 2020-07-31 DIAGNOSIS — M609 Myositis, unspecified: Secondary | ICD-10-CM | POA: Diagnosis not present

## 2020-07-31 DIAGNOSIS — Z951 Presence of aortocoronary bypass graft: Secondary | ICD-10-CM | POA: Diagnosis not present

## 2020-07-31 DIAGNOSIS — I252 Old myocardial infarction: Secondary | ICD-10-CM

## 2020-07-31 DIAGNOSIS — I2510 Atherosclerotic heart disease of native coronary artery without angina pectoris: Secondary | ICD-10-CM

## 2020-07-31 DIAGNOSIS — E039 Hypothyroidism, unspecified: Secondary | ICD-10-CM

## 2020-07-31 DIAGNOSIS — E785 Hyperlipidemia, unspecified: Secondary | ICD-10-CM

## 2020-07-31 MED ORDER — FREESTYLE LIBRE 14 DAY SENSOR MISC: 1.0000 | 11 refills | Status: DC

## 2020-07-31 NOTE — Progress Notes (Signed)
Subjective:  Todd Kelley is a 55 y.o. male who presents for Chief Complaint  Patient presents with  . Follow-up  . Ear Pain    Left ear pain or pressure      Here for hospital follow-up.  He was hospitalized this past week at [facility] for angina and heart failure.  He has a history of prior heart attack, coronary artery disease, CABG, diabetes, asthma, hyperlipidemia, hypertension, hypothyroidism and tobacco use  He started having cardiac symptoms including chest pain and jaw pain last week and went to the hospital.  Catheterization was performed.  He was released on some new medications.  Crestor was discontinued as it was giving him shoulder pains and he stopped it about a month ago.  He was changed to Lipitor and Zetia added.  He notes being on Lipitor plus Zetia in the past without a lot of side effects.  His metformin is changed to XR version and he has less side effects from that compared to generic metformin.  Invokana was prescribed so far this needs prior authorization for insurance to cover.  Overall can definitely fill more winded with activity and decreased energy compared to prior to this past week  He knows he and wife can eat better and they are working on improving diet  He does have some left ear discomfort.  He usually takes Zyrtec for allergies but stopped this recently.  I started have a little bit of head congestion and nasal congestion.  No fever no sore throat no cough  No other aggravating or relieving factors. No other complaint.   Past Medical History:  Diagnosis Date  . Acid reflux   . Allergic asthma    rare flare up, worse with alllergies as of 12/2016  . Coronary artery disease    a. 08/1998 Lateral MI/PCI/BMS [place]; b. 2001 PTCA x 2 2/2 ISR in [place]; c. 2002 PCI/brachyrx 2/2 ISR in [place]; d. 01/2001 CABG x 4: LIMA->LAD, RIMA->RI, VG->RPL->RPDA; e. 01/2014 MV: Low risk w/ attenuation artifact and mild inf basal isch. Nl EF->Med rx.  Marland Kitchen DVT (deep venous  thrombosis) ([place])    a. 01/2001 after CABG.  . Erectile dysfunction   . Hyperlipidemia LDL goal <70   . Hypertension   . Hypothyroidism   . Palpitations   . Pericardial effusion    a. 01/2001 Effusion w/ tamponade req window following CABG.  . Tobacco use    vape, no cigarrette since 04/2015  . Type II diabetes mellitus ([place])     Current Outpatient Medications on File Prior to Visit  Medication Sig Dispense Refill  . aspirin (ASPIRIN LOW DOSE) 81 MG EC tablet Take 1 tablet (81 mg total) by mouth daily. Swallow whole. 90 tablet 3  . atorvastatin (LIPITOR) 10 MG tablet Take 1 tablet (10 mg total) by mouth daily. 30 tablet 1  . canagliflozin (INVOKANA) 100 MG TABS tablet Take 1 tablet (100 mg total) by mouth daily before breakfast. 30 tablet 1  . carvedilol (COREG) 3.125 MG tablet Take 1 tablet (3.125 mg total) by mouth 2 (two) times daily with a meal. 60 tablet 1  . cetirizine (ZYRTEC) 10 MG tablet Take 10 mg by mouth daily.    . clopidogrel (PLAVIX) 75 MG tablet Take 1 tablet (75 mg total) by mouth daily with breakfast. 30 tablet 1  . escitalopram (LEXAPRO) 10 MG tablet Take 1 tablet (10 mg total) by mouth daily. 90 tablet 3  . ezetimibe (ZETIA) 10 MG tablet Take  1 tablet (10 mg total) by mouth daily. 30 tablet 1  . fenofibrate (TRICOR) 145 MG tablet TAKE 1 TABLET BY MOUTH  DAILY 90 tablet 0  . irbesartan (AVAPRO) 150 MG tablet Take 1 tablet (150 mg total) by mouth daily. 30 tablet 1  . levothyroxine (SYNTHROID) 150 MCG tablet Take 1 tablet (150 mcg total) by mouth daily before breakfast. 90 tablet 1  . metFORMIN (GLUCOPHAGE XR) 500 MG 24 hr tablet Take 1 tablet (500 mg total) by mouth daily with breakfast. 30 tablet 11  . Multiple Vitamins-Minerals (MULTIVITAMIN ADULT) CHEW Chew 1 tablet by mouth daily.    Marland Kitchen PROAIR RESPICLICK 403 (90 Base) MCG/ACT AEPB USE 2 INHALATIONS BY MOUTH  EVERY 6 HOURS AS NEEDED 1 each 0  . tadalafil (CIALIS) 5 MG tablet Take 1 tablet (5 mg total) by mouth daily.  90 tablet 3  . [DISCONTINUED] losartan (COZAAR) 100 MG tablet Take 1 tablet (100 mg total) by mouth daily. 90 tablet 3  . [DISCONTINUED] pantoprazole (PROTONIX) 40 MG tablet Take 1 tablet (40 mg total) by mouth daily. **PLEASE SCHEDULE FOLLOW UP APPT** (Patient not taking: No sig reported) 90 tablet 1   No current facility-administered medications on file prior to visit.    Review of systems as in subjective   Objective: BP 120/88   Pulse (!) 56   Temp 98.5 F (36.9 C)   Ht 5\' 10"  (1.778 m)   Wt 219 lb 6.4 oz (99.5 kg)   SpO2 97%   BMI 31.48 kg/m   General appearence: alert, no distress, WD/WN, white male HEENT: normocephalic, sclerae anicteric, TMs pearly, nares patent, no discharge or erythema, pharynx normal Oral cavity: MMM, no lesions Neck: supple, no lymphadenopathy, no thyromegaly, no masses, no JVD or bruits Heart: RRR, normal S1, S2, no murmurs Lungs: CTA bilaterally, no wheezes, rhonchi, or rales Ext: no edema Pulses: 2+ symmetric, upper and lower extremities, normal cap refill      Assessment: Encounter Diagnoses  Name Primary?  . NSTEMI (non-ST elevated myocardial infarction) ([place]) Yes  . S/P CABG (coronary artery bypass graft)   . Statin-induced myositis   . Controlled type 2 diabetes mellitus with complication, without long-term current use of insulin ([place])   . History of MI (myocardial infarction)   . Hypothyroidism (acquired)   . Essential hypertension   . Dyslipidemia   . Coronary artery disease involving native coronary artery of native heart without angina pectoris      Plan: NSTEMI with history of CABG- follow-up with cardiology as planned on June 13.  I reviewed his recent hospital  records including discharge summary from Jul 29, 2020, recent labs, recent imaging, cath report.  Medicines were reconciled.  Jul 28, 2020 echocardiogram shows EF 35 to 40%, moderately decreased left ventricle function, regional wall motion abnormalities including  hypokinesis of the anterior and anteroseptal wall, moderate LVH.   Cardiac cath findings from the same day Jul 28, 2020: 1.  Severe underlying three-vessel coronary artery disease with patent grafts including LIMA to LAD, RIMA to ramus and SVG to right PDA.  There is significant mid to distal LAD stenosis proximal to the LIMA anastomosis, significant ostial left circumflex disease but the left circumflex distribution is relatively small.  There is moderate proximal disease affecting the SVG to right PDA. 2.  Moderately reduced LV systolic function with an EF of 35 to 40% with global hypokinesis. 3.  Normal left ventricular end-diastolic pressure.  Recommendations: No clear culprit is identified for  the patient's non-ST elevation myocardial infarction.  Vessel occlusion with spontaneous recanalization is a possibility especially in the SVG to right PDA. Discontinue heparin for now.  I elected to add clopidogrel for 1 year. Obtain an echocardiogram to better evaluate ejection fraction. I resumed small dose carvedilol given cardiomyopathy and PVCs.  Monitor for bradycardia.  In the future if the patient has chronic anginal symptoms.  Nuclear stress testing can be considered to see if there are areas of ischemia and guide revascularization if needed.  He will follow-up with cardiology on June 13 as planned  Hyperlipidemia, coronary artery disease- he has started the Zetia plus Lipitor which are new and continues on fenofibrate.  He was having some myalgias with Crestor and had quit this on his own about a month ago.  We will plan for fasting lipid recheck in 6 weeks.  At that time we may consider Repatha if not at goal or may need to take off fenofibrate if any bump in liver test.  Counseled on low-cholesterol diet, recommended more of a plant-based diet or pescatarian diet  Diabetes- he recently changed from plain metformin to XR metformin and is tolerating this better per hospital visit.  We will  work with our Scientist, clinical (histocompatibility and immunogenetics) upfront to try to get SGLT2 such as Invokana approved.  Invokana was prescribed but he has not been able to pick this up due to insurance prior authorization needed.  We discussed GLP-1 as an option as well from both cardiac and diabetes standpoint  Hypothyroidism-continue current medication  Hypertension- carvedilol was added, continue other medicines as usual  Counseled on the need to work on much healthier diet and gradual exercise.  He will talk to cardiology about cardiac rehab.  He did this before after his CABG in the past.  Follow up: 6 wk fasting, follow-up with cardiology as planned

## 2020-07-31 NOTE — Telephone Encounter (Signed)
I wanted to make sure you are looking into coverage for Invokana.  This requires PA. He was prescribed this from hospitalization recently.  He has not tolerated plain metformin or farxiga prior.

## 2020-08-03 ENCOUNTER — Telehealth: Payer: Self-pay

## 2020-08-03 NOTE — Telephone Encounter (Signed)
P.A. INVOKANA

## 2020-08-03 NOTE — Telephone Encounter (Signed)
P.A. [facility] sent in

## 2020-08-04 NOTE — Telephone Encounter (Signed)
P.A. denied, pt needs trial and failure of both [place] and [place].  Can pt be switched to Jardiance?

## 2020-08-05 ENCOUNTER — Other Ambulatory Visit: Payer: Self-pay | Admitting: Medical

## 2020-08-05 MED ORDER — EMPAGLIFLOZIN 10 MG PO TABS: 10.0000 mg | ORAL_TABLET | Freq: Every day | ORAL | 2 refills | Status: DC

## 2020-08-05 NOTE — Telephone Encounter (Signed)
I sent Jardiance in place of Invokana due to insurance requirement.  Plus I rather him be on Jardiance anyhow

## 2020-08-05 NOTE — Telephone Encounter (Signed)
Called pharmacy & went thru for $35 with insurance, printed & activated discount card and called pharmacy they will process and call back if have issues, should go down to $10.  Called pt and informed.

## 2020-08-07 ENCOUNTER — Telehealth: Payer: Self-pay | Admitting: Cardiovascular Disease

## 2020-08-07 NOTE — Telephone Encounter (Signed)
Able to return pt's call with concern for SOB for the past week or so, pt reports noticed SOB is more with exertion, but able to recover with rest. Denies CP, but does feel tired since being discharge from the hospital 5/31  Discharge Diagnoses:  Principal Problem:   NSTEMI (non-ST elevated myocardial infarction) [facility]) Active Problems:   Coronary artery disease   S/P CABG (coronary artery bypass graft)   Essential hypertension   BPH (benign prostatic hyperplasia)   Controlled type 2 diabetes mellitus with complication, without long-term current use of insulin [facility])  Todd Kelley reports BP & HR hae been stable. Pt had originally went into the ED with c/o increased SOB with exertion, however pt reports this is "not comparable to before, this isn't as bad". Advised sometime after hospital admission, NSTEMI, and having a heart cath, recovery time can take a little longer, but advised to be mindful if unable to recover from SOB after 15-30 mins rest periods, SOB followed by CP, and/or weakness/dizziness. If symptoms starts then need to seen medical attention vis EMS or ED for another cardiac evaluation.  Has TOC f/u 6/13 (Monday) w/Jacquelin Mickle Plumb, PA-C, advise evaluation of vitals, EKG, medications, and need for changes or additional testing based on how he is feeling. Pt agree with plans feels his SOB is not warranted for an ED visit, he is okay when resting and "not doing anything", will continue to monitor and be seen at f/u appt. 6/13

## 2020-08-07 NOTE — Telephone Encounter (Signed)
Pt c/o Shortness Of Breath: STAT if SOB developed within the last 24 hours or pt is noticeably SOB on the phone  1. Are you currently SOB (can you hear that pt is SOB on the phone)? No - not at the moment   2. How long have you been experiencing SOB? Last week and a half  3. Are you SOB when sitting or when up moving around? Moving around  4. Are you currently experiencing any other symptoms? No chest pain - just feels weak and tired more than normal.

## 2020-08-09 NOTE — Telephone Encounter (Signed)
Done

## 2020-08-10 NOTE — Progress Notes (Signed)
Office Visit    Patient Name: Todd Kelley Date of Encounter: 08/11/2020  PCP:  Todd Kelley   [place]  Cardiologist:  Todd Rogue, MD  Advanced Practice Provider:  No care team member to display Electrophysiologist:  None   Chief Complaint    Chief Complaint  Patient presents with   Hospitalization Follow-up    55 y.o. male with history of CAD s/p prior lateral MI (2000) and CABG x4 in 73/7106 with complicated post op course including tamponade requiring window, DVT, HTN, HLD, DM2, tobacco and vape use, GERD, hypothyroidism, palpitations, ED, and who is being seen today after recent 06/2020 admission for NSTEMI.   Past Medical History    Past Medical History:  Diagnosis Date   Acid reflux    Allergic asthma    rare flare up, worse with alllergies as of 12/2016   Coronary artery disease    a. 08/1998 Lateral MI/PCI/BMS [place]; b. 2001 PTCA x 2 2/2 ISR in [place]; c. 2002 PCI/brachyrx 2/2 ISR in [place]; d. 01/2001 CABG x 4: LIMA->LAD, RIMA->RI, VG->RPL->RPDA; e. 01/2014 MV: Low risk w/ attenuation artifact and mild inf basal isch. Nl EF->Med rx.   DVT (deep venous thrombosis) ([place])    a. 01/2001 after CABG.   Erectile dysfunction    Hyperlipidemia LDL goal <70    Hypertension    Hypothyroidism    Palpitations    Pericardial effusion    a. 01/2001 Effusion w/ tamponade req window following CABG.   Tobacco use    vape, no cigarrette since 04/2015   Type II diabetes mellitus ([place])    Past Surgical History:  Procedure Laterality Date   APPENDECTOMY  2001   CARDIAC CATHETERIZATION  2000   stent   COLONOSCOPY WITH PROPOFOL N/A 03/17/2018   Procedure: COLONOSCOPY WITH PROPOFOL;  Surgeon: Todd Lame, MD;  Location: [place];  Service: Endoscopy;  Laterality: N/A;   CORONARY ANGIOPLASTY  jan. 2001   CORONARY ANGIOPLASTY WITH STENT PLACEMENT  june 2001   stent   CORONARY ARTERY BYPASS GRAFT  dec. 2002   LIMA graft to LAD,RIMA graft  to the intermediate branch and saphenous vein graft  to the posterior lateral and posterior descending branches of the right coronary   LEFT HEART CATH AND CORS/GRAFTS ANGIOGRAPHY N/A 07/28/2020   Procedure: LEFT HEART CATH AND CORS/GRAFTS ANGIOGRAPHY;  Surgeon: Todd Hampshire, MD;  Location: [place] [facility];  Service: Cardiovascular;  Laterality: N/A;   POLYPECTOMY  03/17/2018   Procedure: POLYPECTOMY;  Surgeon: Todd Lame, MD;  Location: [place];  Service: Endoscopy;;    Allergies  Allergies  Allergen Reactions   Farxiga [Dapagliflozin]     weak   Statins     Myalgias other than low dose   Tetracycline     REACTION: Nervous,shakey    History of Present Illness    Todd Kelley is a 55 y.o. male with PMH as above and including CAD, HTN, HLD, DM2, tobacco and vape use, GER, hypothyroidism, palpitations, ED.  Cardiac history dates back to 2000, when he suffered a lateral MI with BMS of ramus intermedius. He had recurrent in stent restenosis, requiring PTCA x2 in 2001 and then PCI and brachytherapy in 2002. Due to in stent restenosis in 01/2001, decision was made for CABG 4. Postoperative course was complicated by tamponade, requiring a pericardial window, as well as a DVT. He followed up with Dr. Martinique. He underwent stress test  in 2015 that was low risk. He then moved to [place], [place]. He was doing well from a cardiac standpoint until presenting to [facility]/2020 with chest pain.   3-4 weeks before his admission, he noted exertional CP rated 3/10 while walking at work. CP was associated with jaw discomfort. Sx usually resolved after 5 minutes. After that time, he noted intermittent upper chest and jaw discomfort that occurred 1-2 times per week without rhyme or reason and was able to do yard work and other activities without sx. Starting about 2 weeks before his admission, he had more exertional sx that initially prompted him to make an OP Cardiology visit with [facility]. The episodes  continued, eventually resulting in his presenting to the Emergency Department. Initial BP 183/100. Nitrates were avoided due to recent Cialis usage. Initial HS Tn 168. The first night in the hospital, he continued to have CP throughout the night with Tn increase to [address].0. He underwent cath 5/30 that showed severe native CAD and patent bypass grafts. The culprit for his NSTEMI was not thought obvious with possible spontaneous recanalization or small branch occlusion. He had CP after the cath that resolved with SL nitro and did not recur. It was noted nitrates should not be used at home due to tadalafil daily at home for ED and BPH. Recommendation was for DAPT for 12 months and control of LDL.   It was recommended he stay out of work until seen at follow-up in 2 weeks. If recurrent CP, ranolazone 500mg  BID was recommended as PDE5i precluded the addition of long acting nitrate. Documentation shows it was suggested he discuss transitioning to alternative therapy with his urologist to allow for addition of long acting nitrate. Escalation of BB was precluded by low resting HR. He was continued on irbesartan with recommendation to consider transition to [facility] in the future if EF did not improve. He was intolerant to high intensity statin therapy and even had myalgias with Crestor 10mg  daily but agreeable to try atorvastatin 10mg  daily with Zetia. PCSK9i were recommended in the future if needed for LDL control.   On 08/07/2020, a phone note documents that he called with concern of shortness of breath over the last week.  Specifically, he noted shortness of breath with exertion.  He stated this shortness of breath was not similar to that reported before, and that it was less severe.  Recommendations were to be mindful of his shortness of breath and make sure that he rested appropriately.  Today, 08/11/2020, he returns to clinic and notes that he has been doing well since his 07/28/2020 catheterization.  He denies any  chest pain or shortness of breath at rest.  His main symptom since discharge has been fatigue and dyspnea.  He feels out of breath with walking.  He also reports a soreness in his upper epigastric area with walking that often improves with sitting up straight or resting.  He feels this pain is musculoskeletal in nature.  He denies any chest pain or soreness in his chest.  No orthopnea or PND.  He does report some trivial lower extremity edema, often noting it above his sock line.  He reports some bendopnea.  He is dizzy when bending over and right after taking his medications. He reports some bloating but is uncertain if related to food.  He reports inconsistent appetite, though for the most part and as of late, he has been very hungry.  He sometimes feels as if his heart is pounding or that  he has a stronger heartbeat.   He reports a history of "flip flopping" of his heart. Usually, his pulse is 55 to 60 bpm whenever he checks it during this time.  Last week, he reports having some "bad days," where he felt fatigued with low motivation.  He works as a Corporate investment banker and reports that he has been working from home part of the day and resting when he gets tired, usually taking a 30-minute nap.  He asks about returning to work with restrictions and a work note provided today.  BP at home has been SBP 130s to 140s DBP 80s to 90s with highest BP 143/98.  Home BP this morning 131/81.  He reports medication compliance.  Wilder Glade reportedly made him tired.  He has not yet started Jardiance, prescribed before discharge.  He asks that we manage his cardiac medications, including his cholesterol medication, rather than his PCP. He no longer takes Cialis and reports that he can continue to avoid this medication if long-acting nitrates need to be prescribed in the near future.  He reports cardiac rehabilitation orientation 08/19/2020.  No signs or symptoms of bleeding. R  femoral arteriotomy site stable and no report of swelling  or infection after the procedure. Mild soreness / R femoral soreness after the procedure but not reported today. Cardiac rehab orientation on the 21st he reports that he currently vapes daily with a 5 mL tank.   Home Medications   Current Outpatient Medications  Medication Instructions   aspirin (ASPIRIN LOW DOSE) 81 mg, Oral, Daily, Swallow whole.   atorvastatin (LIPITOR) 10 mg, Oral, Daily   carvedilol (COREG) 3.125 mg, Oral, 2 times daily with meals   cetirizine (ZYRTEC) 10 mg, Oral, Daily   clopidogrel (PLAVIX) 75 mg, Oral, Daily with breakfast   Continuous Blood Gluc Sensor (FREESTYLE LIBRE 14 DAY SENSOR) MISC 1 each, Does not apply, Every 21 days   empagliflozin (JARDIANCE) 10 mg, Oral, Daily before breakfast   escitalopram (LEXAPRO) 10 mg, Oral, Daily   ezetimibe (ZETIA) 10 mg, Oral, Daily   fenofibrate (TRICOR) 145 MG tablet TAKE 1 TABLET BY MOUTH  DAILY   irbesartan (AVAPRO) 150 mg, Oral, Daily   levothyroxine (SYNTHROID) 150 mcg, Oral, Daily before breakfast   metFORMIN (GLUCOPHAGE XR) 500 mg, Oral, Daily with breakfast   Multiple Vitamins-Minerals (MULTIVITAMIN ADULT) CHEW 1 tablet, Oral, Daily   PROAIR RESPICLICK 570 (90 Base) MCG/ACT AEPB USE 2 INHALATIONS BY MOUTH  EVERY 6 HOURS AS NEEDED   tadalafil (CIALIS) 5 mg, Oral, Daily     Review of Systems    He denies chest pain (but does note upper epigastric pain as below), pnd, orthopnea, n, v, syncope, edema, weight gain, or early satiety.  He reports bilateral upper epigastric pain with ambulation that improves with sitting up straight or rest.  He reports dyspnea with minimal exertion, such as walking.  He reports fatigue and weakness.  He reports trivial ankle edema.  At times, he feels bloated, and he is uncertain if associated with food.  Lately, he has noted increased appetite.  He notes dizziness with bending over and immediately after taking his medications.  He reports palpitations.  All other systems reviewed and are  otherwise negative except as noted above.  Physical Exam    VS:  BP 130/80 (BP Location: Left Arm, Patient Position: Sitting, Cuff Size: Large)   Pulse 65   Ht 5\' 10"  (1.778 m)   Wt 220 lb (99.8 kg)   SpO2 98%  BMI 31.57 kg/m  , BMI Body mass index is 31.57 kg/m. GEN: Well nourished, well developed, in no acute distress. HEENT: normal. Neck: Supple, no JVD, carotid bruits, or masses. Cardiac: RRR, no murmurs, rubs, or gallops. No clubbing, cyanosis, edema.  Radials/DP/PT 2+ and equal bilaterally.  Respiratory:  Respirations regular and unlabored, clear to auscultation bilaterally. GI: Soft, nontender, nondistended, BS + x 4. MS: no deformity or atrophy. Skin: warm and dry, no rash. Neuro:  Strength and sensation are intact. Psych: Normal affect.  Accessory Clinical Findings    ECG personally reviewed by me today - Accelerated junctional rhythm with frequent PVCs, 65bpm, poor R wave progression in inferior leads, IVCD with QRS 155ms, LAD, - no acute changes.  VITALS Reviewed today   Temp Readings from Last 3 Encounters:  07/31/20 98.5 F (36.9 C)  07/29/20 98.1 F (36.7 C)  03/18/20 98.6 F (37 C) (Oral)   BP Readings from Last 3 Encounters:  08/11/20 130/80  07/31/20 120/88  07/29/20 (!) 154/85   Pulse Readings from Last 3 Encounters:  08/11/20 65  07/31/20 (!) 56  07/29/20 71    Wt Readings from Last 3 Encounters:  08/11/20 220 lb (99.8 kg)  07/31/20 219 lb 6.4 oz (99.5 kg)  07/29/20 215 lb 3.2 oz (97.6 kg)     LABS  reviewed today   Lab Results  Component Value Date   WBC 8.1 07/28/2020   HGB 15.1 07/28/2020   HCT 43.9 07/28/2020   MCV 84.7 07/28/2020   PLT 160 07/28/2020   Lab Results  Component Value Date   CREATININE 1.19 07/29/2020   BUN 15 07/29/2020   NA 138 07/29/2020   K 3.8 07/29/2020   CL 105 07/29/2020   CO2 24 07/29/2020   Lab Results  Component Value Date   ALT 32 07/27/2020   AST 63 (H) 07/27/2020   ALKPHOS 45 07/27/2020    BILITOT 0.9 07/27/2020   Lab Results  Component Value Date   CHOL 207 (H) 07/26/2020   HDL 41 07/26/2020   LDLCALC 138 (H) 07/26/2020   TRIG 141 07/26/2020   CHOLHDL 5.0 07/26/2020    Lab Results  Component Value Date   HGBA1C 7.4 (H) 07/25/2020   Lab Results  Component Value Date   TSH 5.960 (H) 03/14/2020     STUDIES/PROCEDURES reviewed today   Echo 07/28/20  1. Left ventricular ejection fraction, by estimation, is 35 to 40%. The  left ventricle has moderately decreased function. The left ventricle  demonstrates regional wall motion abnormalities (Hypokinesis of the  anterior and anteroseptal wall). There is  moderate left ventricular hypertrophy. Left ventricular diastolic  parameters are consistent with Grade I diastolic dysfunction (impaired  relaxation).   2. Right ventricular systolic function is normal. The right ventricular  size is normal.   3. Left atrial size was mildly dilated.   4. Frequent PVCs   LHC 07/28/20 Wall Motion  Resting                 Left Heart  Left Ventricle There is moderate left ventricular systolic dysfunction. LV end diastolic pressure is normal. The left ventricular ejection fraction is 35-45% by visual estimate. There are LV function abnormalities due to global hypokinesis.    Coronary Diagrams   Diagnostic Dominance: Right      Ramus lesion is 100% stenosed. Ost Cx to Prox Cx lesion is 85% stenosed. Prox LAD to Mid LAD lesion is 100% stenosed. 1st Diag lesion is 80%  stenosed. There is moderate left ventricular systolic dysfunction. The left ventricular ejection fraction is 35-45% by visual estimate. LV end diastolic pressure is normal. Origin to Prox Graft lesion is 50% stenosed. RPDA lesion is 60% stenosed. RIMA graft was visualized by angiography and is normal in caliber. LIMA graft was visualized by angiography and is normal in caliber. The graft exhibits no disease. Mid LAD lesion is 85% stenosed. 1.   Severe underlying three-vessel coronary artery disease with patent grafts including LIMA to LAD, RIMA to ramus and SVG to right PDA.  There is significant mid to distal LAD stenosis proximal to the LIMA anastomosis, significant ostial left circumflex disease but the left circumflex distribution is relatively small.  There is moderate proximal disease affecting the SVG to right PDA. 2.  Moderately reduced LV systolic function with an EF of 35 to 40% with global hypokinesis. 3.  Normal left ventricular end-diastolic pressure. Recommendations: No clear culprit is identified for the patient's non-ST elevation myocardial infarction.  Vessel occlusion with spontaneous recanalization is a possibility especially in the SVG to right PDA. Discontinue heparin for now.  I elected to add clopidogrel for 1 year. Obtain an echocardiogram to better evaluate ejection fraction. I resumed small dose carvedilol given cardiomyopathy and PVCs.  Monitor for bradycardia. In the future if the patient has chronic anginal symptoms.  Nuclear stress testing can be considered to see if there are areas of ischemia and guide revascularization if needed.   02/08/2014 MPI Impression Exercise Capacity:  Good exercise capacity. BP Response:  Normal blood pressure response. Clinical Symptoms:  No chest pain or dyspnea. ECG Impression:  No significant ST segment change suggestive of ischemia. Comparison with Prior Nuclear Study: Inferior ischemia new compared to previous Overall Impression:  Low risk stress nuclear study with a small, moderate intensity, partially reversible basal inferior lateral defect consistent with soft tissue attenuation and mild ischemia in the inferior basal wall.. LV Wall Motion:  NL LV Function; NL Wall Motion   Assessment & Plan    History of NSTEMI Coronary artery disease s/p lateral MI (2000) and CABG x4 (01/2001) -- No recurrent chest pain as before his recent catheterization.  He does report some  upper epigastric pain, occurring with exertion and alleviated by rest or sitting up straight.  He attributes this pain to MSK etiology and describes it as a soreness.  He reports dyspnea and fatigue since discharge.  He is s/p 5/30 LHC with severe CAD and patent bypass grafts but unclear culprit for NSTEMI. Moderately reduced LVSF noted.echo with EF 35 to 40%, hypokinesis of anterior/anteroseptal wall, moderate LVH, G1 DD, mild LAE, frequent PVCs. R femoral site stable. It was thought possible  vessel occlusion with spontaneous recanalization in the SVG to rPDA. Of note, in the past, he has had a lateral MI (2000) and CABG x4 in 5361, complicated by cardiac tamponade requiring window. -- Nitrate therapy has been precluded in the setting of tadalafil / PDE5i.  He states today that he is no longer taking Cialis and agreeable to hold off on this medication of long-acting nitrates needed in the future.  For now, we will start as needed sublingual nitro. He is agreeable to not take his Cialis with this medication. No indication for Ranexa or Imdur at this time.  He is aware that this medication should not be taken with his Cialis. -- Scheduled to start cardiac rehab.   --If ongoing dyspnea, upper epigastric discomfort, and fatigue, or if new / progressive sx,  recommend MPI (as noted in cath report) +/- repeat cath at that time. He will closely monitor his sx. --Continue current medications: DAPT with ASA and clopidogrel for at least 12 months. No s/sx of bleeding.  Continue BB -escalation of BB has been precluded by lower heart rate in the past.  Given his dizziness and junctional rhythm on EKG, we will hold off on any changes to his beta-blocker at this time. --Repeat echo in 3-6 months to reassess EF on GDMT. --Aggressive treatment of risk factors with optimized LDL control with consideration of statin intolerance.   Chronic combined systolic and diastolic heart failure Ischemic cardiomyopathy --Reports  dyspnea and fatigue, as well as upper epigastric discomfort as above.  He also notes periodic dizziness and palpitations.  Appears euvolemic and well compensated on exam, though wt is up 5 lbs from 5/31 with consideration of different scales. We will need to monitor volume status carefully as he is not on a diuretic.  LVEF 35-40% by 07/28/2020 echo.  Also noted was hypokinesis of anterior/anterior septal wall, moderate LVH, G1 DD, mild LAE, frequent PVCs. --Monitor wt, BP at home. No indication for a standing diuretic at this time.  --Given junctional rhythm, consider OP sleep study in the future as noted during admission. --Continue current medications: Coreg 3.125mg  BID with low resting HR previously precluding escalation of BB.  Irbesartan 150mg  daily.  Consider Entresto in the future if LVEF does not improve on repeat echo.  Will defer transition to [facility] at this time given his report of dizziness as above.   He plans to start his Vania Rea --if this medication remains expensive through insurance, consider Wilder Glade with consideration of renal function and as long as he does not get fatigued with this medication as reported in the past.  Palpitations, dizziness, PVCs, junctional rhythm --Reports dyspnea, fatigue, and palpitations.   --Ordered cardiac monitoring.  ZIO XT x2 weeks.   --Check labs:  TSH given abnormal TSH in past with known hypothyroidism BMET to ensure electrolytes at goal, given mild hypokalemia during his admission. --OP sleep study recommended in the future due to junctional bradycardia and report of snoring during admission. Will defer for now while we get the Zio XT completed first given his ectopy and rhythm today. --Will continue BB for now and reassess once we obtain his monitor data. His rate today is in the 60s and not bradycardic. He will monitor his sx.  HLD, LDL goal <70 Hypertriglyceridemia History of statin intolerance (with higher statin doses) --Intolerant to  high intensity statin therapy and myalgias with Crestor.   --Started on atorvastatin 10 mg daily and tolerating this well. --07/26/20 labs show total cholesterol 207, LDL 138, HDL 41, Tg 141. --Current LDL above goal, Tg at goal.  --Current AST elevated as below, ALT wnl. --Continue atorvastatin with Zetia, continue fenofibrate. --Recheck lipid and liver function ~08/2020. --Consider PCSK9i in the future if needed for LDL control below 70.  Transaminitis --Noted during admission with AST 63. ALT wnl. We will plan for recheck AST and 6 to 8 weeks or ~08/2020. Continue statin for now.  Further recommendations pending repeat labs.  HTN, goal BP 130/80 or lower --BP today borderline at 130/80.  Continue irbesartan and carvedilol.  Keep total daily fluid under 2 L/day and salt under 2 g/day.  Monitor BP at home with goal BP 130/80 or lower.  DM2 --Hemoglobin A1C 7.1 03/2020 --Continue SGLT2i in the setting of HFrEF.  He has not yet started his [place] and  will start today.  Hypothyroidism with previous TSH anormal  -- We will recheck this value, given his palpitations and junctional rhythm today.  Will defer any abnormal TSH work-up to PCP.  Continue Synthroid.  Vape use --Complete cessation advised.  He will work on cessation.  Medication changes: PRN SL nitro. He was educated to not take this if still taking Cialis, which he states he is no longer taking at this time Labs ordered: BMET, TSH Studies / Imaging ordered: Zio XT x2 weeks Future considerations: Delene Loll, Farxiga re-trial if Jardiance too expensive, diuretic if indicated, PCSKI9i if LDL not well controlled on repeat labs, echo at RTC to reassess EF / MPI versus cath if ongoign sx, OP sleep study given junctional bradycardia and report of snoring (will defer for now) Disposition: RTC after Zio  *Please be aware that the above documentation was completed voice recognition software and may contain dictation errors.        Arvil Chaco, PA-C 08/11/2020

## 2020-08-11 ENCOUNTER — Ambulatory Visit (INDEPENDENT_AMBULATORY_CARE_PROVIDER_SITE_OTHER): Payer: No Typology Code available for payment source | Admitting: Physician Assistant

## 2020-08-11 ENCOUNTER — Ambulatory Visit (INDEPENDENT_AMBULATORY_CARE_PROVIDER_SITE_OTHER): Payer: No Typology Code available for payment source

## 2020-08-11 ENCOUNTER — Other Ambulatory Visit: Payer: Self-pay

## 2020-08-11 ENCOUNTER — Encounter: Payer: Self-pay | Admitting: Physician Assistant

## 2020-08-11 ENCOUNTER — Encounter: Payer: No Typology Code available for payment source | Attending: Cardiovascular Disease | Admitting: *Deleted

## 2020-08-11 ENCOUNTER — Encounter: Payer: Self-pay | Admitting: *Deleted

## 2020-08-11 VITALS — BP 130/80 | HR 65 | Ht 70.0 in | Wt 220.0 lb

## 2020-08-11 DIAGNOSIS — Z720 Tobacco use: Secondary | ICD-10-CM

## 2020-08-11 DIAGNOSIS — I498 Other specified cardiac arrhythmias: Secondary | ICD-10-CM

## 2020-08-11 DIAGNOSIS — I255 Ischemic cardiomyopathy: Secondary | ICD-10-CM

## 2020-08-11 DIAGNOSIS — I214 Non-ST elevation (NSTEMI) myocardial infarction: Secondary | ICD-10-CM

## 2020-08-11 DIAGNOSIS — Z951 Presence of aortocoronary bypass graft: Secondary | ICD-10-CM

## 2020-08-11 DIAGNOSIS — R002 Palpitations: Secondary | ICD-10-CM

## 2020-08-11 DIAGNOSIS — Z5189 Encounter for other specified aftercare: Secondary | ICD-10-CM | POA: Insufficient documentation

## 2020-08-11 DIAGNOSIS — I10 Essential (primary) hypertension: Secondary | ICD-10-CM

## 2020-08-11 DIAGNOSIS — E118 Type 2 diabetes mellitus with unspecified complications: Secondary | ICD-10-CM

## 2020-08-11 DIAGNOSIS — I2510 Atherosclerotic heart disease of native coronary artery without angina pectoris: Secondary | ICD-10-CM

## 2020-08-11 DIAGNOSIS — Z789 Other specified health status: Secondary | ICD-10-CM

## 2020-08-11 DIAGNOSIS — R5383 Other fatigue: Secondary | ICD-10-CM

## 2020-08-11 DIAGNOSIS — I252 Old myocardial infarction: Secondary | ICD-10-CM | POA: Diagnosis not present

## 2020-08-11 DIAGNOSIS — E785 Hyperlipidemia, unspecified: Secondary | ICD-10-CM

## 2020-08-11 DIAGNOSIS — E039 Hypothyroidism, unspecified: Secondary | ICD-10-CM

## 2020-08-11 DIAGNOSIS — I5042 Chronic combined systolic (congestive) and diastolic (congestive) heart failure: Secondary | ICD-10-CM

## 2020-08-11 DIAGNOSIS — R7401 Elevation of levels of liver transaminase levels: Secondary | ICD-10-CM

## 2020-08-11 DIAGNOSIS — I493 Ventricular premature depolarization: Secondary | ICD-10-CM

## 2020-08-11 MED ORDER — NITROGLYCERIN 0.4 MG SL SUBL: 0.4000 mg | SUBLINGUAL_TABLET | SUBLINGUAL | 2 refills | Status: DC | PRN

## 2020-08-11 NOTE — Patient Instructions (Signed)
Medication Instructions:  Your physician has recommended you make the following change in your medication:   Nitroglycerin as needed. If you experience chest pain: Place 1 nitroglycerin under your tongue, while sitting. If no relief of pain, may repeat 1 tablet every 5 minutes up to 3 tablets over 15 minutes. If no relief call 911. If you have dizziness/lightheadedness while taking nitroglycerin, stop taking and call 911.    *If you need a refill on your cardiac medications before your next appointment, please call your pharmacy*   Lab Work: BMET & TSH to be done today.  If you have labs (blood work) drawn today and your tests are completely normal, you will receive your results only by: [place] (if you have MyChart) OR A paper copy in the mail If you have any lab test that is abnormal or we need to change your treatment, we will call you to review the results.   Testing/Procedures: Your physician has recommended that you wear a Zio monitor for 2 weeks and remove on June 27 th.   This monitor is a medical device that records the heart's electrical activity. Doctors most often use these monitors to diagnose arrhythmias. Arrhythmias are problems with the speed or rhythm of the heartbeat. The monitor is a small device applied to your chest. You can wear one while you do your normal daily activities. While wearing this monitor if you have any symptoms to push the button and record what you felt. Once you have worn this monitor for the period of time provider prescribed (Usually 14 days), you will return the monitor device in the postage paid box. Once it is returned they will download the data collected and provide [place] with a report which the provider will then review and we will call you with those results. Important tips:  Avoid showering during the first 24 hours of wearing the monitor. Avoid excessive sweating to help maximize wear time. Do not submerge the device, no hot tubs, and no  swimming pools. Keep any lotions or oils away from the patch. After 24 hours you may shower with the patch on. Take brief showers with your back facing the shower head.  Do not remove patch once it has been placed because that will interrupt data and decrease adhesive wear time. Push the button when you have any symptoms and write down what you were feeling. Once you have completed wearing your monitor, remove and place into box which has postage paid and place in your outgoing mailbox.  If for some reason you have misplaced your box then call our office and we can provide another box and/or mail it off for you.      Follow-Up: At [facility], you and your health needs are our priority.  As part of our continuing mission to provide you with exceptional heart care, we have created designated Provider Care Teams.  These Care Teams include your primary Cardiologist (physician) and Advanced Practice Providers (APPs -  Physician Assistants and Nurse Practitioners) who all work together to provide you with the care you need, when you need it.   Your next appointment:   Follow up after your monitor has been completed.   The format for your next appointment:   In Person  Provider:   Ida Rogue, MD or Marrianne Mood, PA-C

## 2020-08-11 NOTE — Progress Notes (Signed)
Initial telephone orientation completed. Diagnosis can be found in [facility] 5/27. EP orientation scheduled for Tuesday 6/21 at 8am.

## 2020-08-12 LAB — BASIC METABOLIC PANEL WITH GFR
BUN/Creatinine Ratio: 13 (ref 9–20)
BUN: 15 mg/dL (ref 6–24)
CO2: 20 mmol/L (ref 20–29)
Calcium: 9.8 mg/dL (ref 8.7–10.2)
Chloride: 106 mmol/L (ref 96–106)
Creatinine, Ser: 1.19 mg/dL (ref 0.76–1.27)
Glucose: 119 mg/dL — ABNORMAL HIGH (ref 65–99)
Potassium: 4.2 mmol/L (ref 3.5–5.2)
Sodium: 142 mmol/L (ref 134–144)
eGFR: 72 mL/min/1.73 (ref >59)

## 2020-08-12 LAB — TSH: TSH: 3.3 u[IU]/mL (ref 0.450–4.500)

## 2020-08-14 ENCOUNTER — Other Ambulatory Visit: Payer: Self-pay

## 2020-08-14 ENCOUNTER — Other Ambulatory Visit
Admission: RE | Admit: 2020-08-14 | Discharge: 2020-08-14 | Disposition: A | Discharge status: Home / Self Care | Payer: No Typology Code available for payment source | Source: Ambulatory Visit | Attending: Physician Assistant | Admitting: Physician Assistant

## 2020-08-14 ENCOUNTER — Ambulatory Visit (INDEPENDENT_AMBULATORY_CARE_PROVIDER_SITE_OTHER): Payer: No Typology Code available for payment source | Admitting: Physician Assistant

## 2020-08-14 ENCOUNTER — Telehealth: Payer: Self-pay | Admitting: Cardiovascular Disease

## 2020-08-14 ENCOUNTER — Encounter: Payer: Self-pay | Admitting: Physician Assistant

## 2020-08-14 VITALS — BP 138/100 | HR 62 | Ht 70.0 in | Wt 219.4 lb

## 2020-08-14 DIAGNOSIS — I5020 Unspecified systolic (congestive) heart failure: Secondary | ICD-10-CM | POA: Diagnosis present

## 2020-08-14 DIAGNOSIS — R079 Chest pain, unspecified: Secondary | ICD-10-CM | POA: Diagnosis not present

## 2020-08-14 DIAGNOSIS — R7401 Elevation of levels of liver transaminase levels: Secondary | ICD-10-CM

## 2020-08-14 DIAGNOSIS — Z951 Presence of aortocoronary bypass graft: Secondary | ICD-10-CM

## 2020-08-14 DIAGNOSIS — Z79899 Other long term (current) drug therapy: Secondary | ICD-10-CM | POA: Insufficient documentation

## 2020-08-14 DIAGNOSIS — E118 Type 2 diabetes mellitus with unspecified complications: Secondary | ICD-10-CM

## 2020-08-14 DIAGNOSIS — G4730 Sleep apnea, unspecified: Secondary | ICD-10-CM | POA: Diagnosis not present

## 2020-08-14 DIAGNOSIS — Z789 Other specified health status: Secondary | ICD-10-CM

## 2020-08-14 DIAGNOSIS — Z720 Tobacco use: Secondary | ICD-10-CM

## 2020-08-14 DIAGNOSIS — R002 Palpitations: Secondary | ICD-10-CM

## 2020-08-14 DIAGNOSIS — R5383 Other fatigue: Secondary | ICD-10-CM

## 2020-08-14 DIAGNOSIS — I255 Ischemic cardiomyopathy: Secondary | ICD-10-CM

## 2020-08-14 DIAGNOSIS — I498 Other specified cardiac arrhythmias: Secondary | ICD-10-CM

## 2020-08-14 DIAGNOSIS — I493 Ventricular premature depolarization: Secondary | ICD-10-CM

## 2020-08-14 DIAGNOSIS — I252 Old myocardial infarction: Secondary | ICD-10-CM

## 2020-08-14 DIAGNOSIS — I25118 Atherosclerotic heart disease of native coronary artery with other forms of angina pectoris: Secondary | ICD-10-CM

## 2020-08-14 DIAGNOSIS — E039 Hypothyroidism, unspecified: Secondary | ICD-10-CM

## 2020-08-14 DIAGNOSIS — E785 Hyperlipidemia, unspecified: Secondary | ICD-10-CM

## 2020-08-14 DIAGNOSIS — I5042 Chronic combined systolic (congestive) and diastolic (congestive) heart failure: Secondary | ICD-10-CM

## 2020-08-14 DIAGNOSIS — I10 Essential (primary) hypertension: Secondary | ICD-10-CM

## 2020-08-14 LAB — BASIC METABOLIC PANEL
Anion gap: 6 (ref 5–15)
BUN: 15 mg/dL (ref 6–20)
CO2: 29 mmol/L (ref 22–32)
Calcium: 9.6 mg/dL (ref 8.9–10.3)
Chloride: 106 mmol/L (ref 98–111)
Creatinine, Ser: 1.27 mg/dL — ABNORMAL HIGH (ref 0.61–1.24)
GFR, Estimated: >60 mL/min (ref >60)
Glucose, Bld: 121 mg/dL — ABNORMAL HIGH (ref 70–99)
Potassium: 4.7 mmol/L (ref 3.5–5.1)
Sodium: 141 mmol/L (ref 135–145)

## 2020-08-14 LAB — TROPONIN I (HIGH SENSITIVITY): Troponin I (High Sensitivity): 22 ng/L — ABNORMAL HIGH (ref <18)

## 2020-08-14 MED ORDER — TORSEMIDE 10 MG PO TABS: 10.0000 mg | ORAL_TABLET | Freq: Every day | ORAL | 5 refills | Status: DC

## 2020-08-14 NOTE — Patient Instructions (Addendum)
Medication Instructions:  Your physician has recommended you make the following change in your medication:   STOP Carvedilol   START Torsemide 10mg  daily   *If you need a refill on your cardiac medications before your next appointment, please call your pharmacy*   Lab Work:  1) STAT Troponin at the [facility] today -  Please go to the [facility]. You will check in at the front desk to the right as you walk into the atrium.   2) Your physician recommends that you return for lab work to the [facility] in: 1-2 weeks (BMET) -  Please go to the [facility]. You will check in at the front desk to the right as you walk into the atrium. Valet Parking is offered if needed. - No appointment needed. You may go any day between 7 am and 6 pm.   Testing/Procedures: [facility] [place]  1)  Your caregiver has ordered a [place] with nuclear imaging. The purpose of this test is to evaluate the blood supply to your heart muscle. This procedure is referred to as a "Non-Invasive Stress Test." This is because other than having an IV started in your vein, nothing is inserted or "invades" your body. Cardiac stress tests are done to find areas of poor blood flow to the heart by determining the extent of coronary artery disease (CAD). Some patients exercise on a treadmill, which naturally increases the blood flow to your heart, while others who are  unable to walk on a treadmill due to physical limitations have a pharmacologic/chemical stress agent called Lexiscan . This medicine will mimic walking on a treadmill by temporarily increasing your coronary blood flow.   Please note: these test may take anywhere between 2-4 hours to complete  PLEASE REPORT TO [place] AT THE FIRST DESK WILL DIRECT YOU WHERE TO GO  Date of Procedure:_____________________________________  Arrival Time for Procedure:______________________________  Instructions  regarding medication:   __X__ : Hold diabetes medication morning of procedure:  Jardiance    __X__:  Hold other medications as follows:  Do Not take Torsemide morning of procedure   PLEASE NOTIFY THE OFFICE AT LEAST 24 HOURS IN ADVANCE IF YOU ARE UNABLE TO KEEP YOUR APPOINTMENT.  [phone] AND  PLEASE NOTIFY NUCLEAR MEDICINE AT [facility] AT LEAST 24 HOURS IN ADVANCE IF YOU ARE UNABLE TO KEEP YOUR APPOINTMENT. [phone]  How to prepare for your Myoview test:  Do not eat or drink after midnight No caffeine for 24 hours prior to test No smoking 24 hours prior to test. Your medication may be taken with water.  If your doctor stopped a medication because of this test, do not take that medication. Please wear a short sleeve shirt. No perfume, cologne or lotion.   2) WatchPAT?  Is a FDA cleared portable home sleep study test that uses a watch and 3 points of contact to monitor 7 different channels, including your heart rate, oxygen saturations, body position, snoring, and chest motion.  The study is easy to use from the comfort of your own home and accurately detect sleep apnea.  Before bed, you attach the chest sensor, attached the sleep apnea bracelet to your nondominant hand, and attach the finger probe.  After the study, the raw data is downloaded from the watch and scored for apnea events.   For more information: [email or web address]     Follow-Up: At [facility], you and your health needs are our priority.  As part of our continuing mission to provide you with exceptional heart care, we have created designated Provider Care Teams.  These Care Teams include your primary Cardiologist (physician) and Advanced Practice Providers (APPs -  Physician Assistants and Nurse Practitioners) who all work together to provide you with the care you need, when you need it.  We recommend signing up for the patient portal called "MyChart".  Sign up information is provided on this  After Visit Summary.  MyChart is used to connect with patients for Virtual Visits (Telemedicine).  Patients are able to view lab/test results, encounter notes, upcoming appointments, etc.  Non-urgent messages can be sent to your provider as well.   To learn more about what you can do with MyChart, go to [email or web address].    Your next appointment:    Follow up next week  The format for your next appointment:   In Person  Provider:   You may see Ida Rogue, MD or one of the following Advanced Practice Providers on your designated Care Team:   Murray Hodgkins, NP Christell Faith, PA-C Marrianne Mood, PA-C [place], [place] Laurann Montana, NP

## 2020-08-14 NOTE — Telephone Encounter (Signed)
Was able to return [name] call regarding chest and jaw pain. He reports last evening while pulling a water hose across the yard he experienced jaw pain and a little pressure in his chest. Reports he stopped and rest and it went away.  Noticed same sort of sensation, jaw pain and chest pressure (denies pain) while having sex last night, reports he stopped his activities, sat down and both subsided. Did not take nitro.  This morning he reports is feeling better, but stills has a little discomfort in his chest, no pain, denies SOB, or weakness. Reported BP 134/97, HR 58 this am. Pt does report the sensation is similar to his prior heart attack. Hx of prior stents and CABGx3 in 2002, recent left heart cath last month (07/28/20) with Dr. Fletcher Anon.  One open slot today, was able to schedule with Mickle Plumb, PA-C at [facility], recently seen 6/13 with Mickle Plumb as well for [facility] after recent 06/2020 admission for NSTEMI.  Blanche East, PA-C and nurse Jinny Blossom both notified in person of add on to schedule for pt to be evaluated this am. Both verbalized understanding and agreeable pt needs to be seen, EKG and possible STAT troponin to be obtained.

## 2020-08-14 NOTE — Progress Notes (Signed)
Office Visit    Patient Name: Todd Kelley Date of Encounter: 08/14/2020  PCP:  Tysinger, David S, [place]  Cardiologist:  Ida Rogue, MD  Advanced Practice Provider:  No care team member to display Electrophysiologist:  None   Chief Complaint    Chief Complaint  Patient presents with   Chest pain     Patient c/o chest pain and jaw pain during sexual activity last night. Patient denies any chest pain or jaw pain this morning but does feel a "flip flopping" in chest now. Medications reviewed by the patient verbally.      55 y.o. male with history of CAD s/p prior lateral MI (2000) and CABG x4 in 14/9702 with complicated post op course including tamponade requiring window, DVT, HTN, HLD, DM2, tobacco and vape use, GERD, hypothyroidism, palpitations, ED, and who is being seen today after recent 06/2020 admission for NSTEMI.   Past Medical History    Past Medical History:  Diagnosis Date   Acid reflux    Allergic asthma    rare flare up, worse with alllergies as of 12/2016   Coronary artery disease    a. 08/1998 Lateral MI/PCI/BMS [place]; b. 2001 PTCA x 2 2/2 ISR in [place]; c. 2002 PCI/brachyrx 2/2 ISR in [place]; d. 01/2001 CABG x 4: LIMA->LAD, RIMA->RI, VG->RPL->RPDA; e. 01/2014 MV: Low risk w/ attenuation artifact and mild inf basal isch. Nl EF->Med rx.   DVT (deep venous thrombosis) ([place])    a. 01/2001 after CABG.   Erectile dysfunction    Hyperlipidemia LDL goal <70    Hypertension    Hypothyroidism    Palpitations    Pericardial effusion    a. 01/2001 Effusion w/ tamponade req window following CABG.   Tobacco use    vape, no cigarrette since 04/2015   Type II diabetes mellitus ([place])    Past Surgical History:  Procedure Laterality Date   APPENDECTOMY  2001   CARDIAC CATHETERIZATION  2000   stent   COLONOSCOPY WITH PROPOFOL N/A 03/17/2018   Procedure: COLONOSCOPY WITH PROPOFOL;  Surgeon: Lucilla Lame, MD;  Location: [facility];   Service: Endoscopy;  Laterality: N/A;   CORONARY ANGIOPLASTY  jan. 2001   CORONARY ANGIOPLASTY WITH STENT PLACEMENT  june 2001   stent   CORONARY ARTERY BYPASS GRAFT  dec. 2002   LIMA graft to LAD,RIMA graft to the intermediate branch and saphenous vein graft  to the posterior lateral and posterior descending branches of the right coronary   LEFT HEART CATH AND CORS/GRAFTS ANGIOGRAPHY N/A 07/28/2020   Procedure: LEFT HEART CATH AND CORS/GRAFTS ANGIOGRAPHY;  Surgeon: Wellington Hampshire, MD;  Location: [facility];  Service: Cardiovascular;  Laterality: N/A;   POLYPECTOMY  03/17/2018   Procedure: POLYPECTOMY;  Surgeon: Lucilla Lame, MD;  Location: [facility];  Service: Endoscopy;;    Allergies  Allergies  Allergen Reactions   Farxiga [Dapagliflozin]     weak   Statins     Myalgias other than low dose   Tetracycline     REACTION: Nervous,shakey    History of Present Illness    Todd Kelley is a 55 y.o. male with PMH as above and including CAD, HTN, HLD, DM2, tobacco and vape use, GER, hypothyroidism, palpitations, ED.  Cardiac history dates back to 2000, when he suffered a lateral MI with BMS of ramus intermedius. He had recurrent in stent restenosis, requiring PTCA x2 in 2001 and then  PCI and brachytherapy in 2002. Due to in stent restenosis in 01/2001, decision was made for CABG 4. Postoperative course was complicated by tamponade, requiring a pericardial window, as well as a DVT. He followed up with Dr. Martinique. He underwent stress test in 2015 that was low risk. He then moved to [place], [place]. He was doing well from a cardiac standpoint until presenting to [facility] 06/2020 with chest pain.   3-4 weeks before his admission, he noted exertional CP rated 3/10 while walking at work. CP was associated with jaw discomfort. Sx usually resolved after 5 minutes. After that time, he noted intermittent upper chest and jaw discomfort that occurred 1-2 times per week without rhyme or reason  and was able to do yard work and other activities without sx. Starting about 2 weeks before his admission, he had more exertional sx that initially prompted him to make an OP Cardiology visit with [facility]. The episodes continued, eventually resulting in his presenting to the Emergency Department. Initial BP 183/100. Nitrates were avoided due to recent Cialis usage. Initial HS Tn 168. The first night in the hospital, he continued to have CP throughout the night with Tn increase to [address].0. He underwent cath 5/30 that showed severe native CAD and patent bypass grafts. The culprit for his NSTEMI was not thought obvious with possible spontaneous recanalization or small branch occlusion. He had CP after the cath that resolved with SL nitro and did not recur. It was noted nitrates should not be used at home due to tadalafil daily at home for ED and BPH. Recommendation was for DAPT for 12 months and control of LDL.   It was recommended he stay out of work until seen at follow-up in 2 weeks. If recurrent CP, ranolazone 500mg  BID was recommended as PDE5i precluded the addition of long acting nitrate. Documentation shows it was suggested he discuss transitioning to alternative therapy with his urologist to allow for addition of long acting nitrate. Escalation of BB was precluded by low resting HR. He was continued on irbesartan with recommendation to consider transition to [facility] in the future if EF did not improve. He was intolerant to high intensity statin therapy and even had myalgias with Crestor 10mg  daily but agreeable to try atorvastatin 10mg  daily with Zetia. PCSK9i were recommended in the future if needed for LDL control.   On 08/07/2020, a phone note documents that he called with concern of shortness of breath over the last week.  Specifically, he noted shortness of breath with exertion.  He stated this shortness of breath was not similar to that reported before, and that it was less severe.  Recommendations were to  be mindful of his shortness of breath and make sure that he rested appropriately.  Same as 08/11/2020 for follow-up without any chest pain or shortness of breath at rest.  He reported fatigue and dyspnea with walking.  He also reported a soreness in his upper epigastric area with walking, improving with sitting up straight or resting.  He attributed this pain to musculoskeletal etiology.  He reported trivial LEE above his sock line.  He also noted bendopnea and dizziness when bending over right after taking his medications.  He felt bloating and uncertain if related to food.  He reported inconsistent appetite, though very hungry lately.  He had tachypalpitations with pulse usually 55 to 60 bpm whenever he checked it during that time.  Over the last week, he noted more fatigue and low motivation.  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  Usually, he will take a 30-minute nap.  Return restrictions provided for work with associated work note.  Home SBP 130s to 140s DBP 80s to 90s with highest BP 143/98.  He stated Wilder Glade made him tired, though he had yet to start the Jardiance prescribed at discharge.  He was no longer taking Cialis.  Cardiac rehab orientation with 08/19/2020.  Right femoral arteriotomy site stable without any evidence of swelling or infection.  He reported mild right femoral soreness after the procedure but not today.  He was vaping daily with a 5 mL tank.  As needed sublingual nitro was prescribed and repeat labs collected.  Due to his tachypalpitations, Zio XT x2 weeks was also prescribed.  It was noted that Entresto could be considered in the future, as well as PCSK9 inhibitors if LDL not well controlled.  Outpatient sleep study was also recommended given accelerated junctional bradycardia seen on EKG that day, as well as patient report of snoring.  On 08/14/2020, he called the office and reported exertional chest pain on 6/15 x  2.  The first episode occurred when driving a hose outside.  CP was described as a pressure/pain that began in the jaw and throat and then extended down into the chest, eventually dissipating without clear alleviating factors other than time.  The second episode occurred during intercourse.  As soon as the symptoms began, he discontinued this activity before it could progress.  He then sat down to rest.  CP felt the same as previous episode and began in his throat and jaw, then started to move down to his chest.  On 6/16, he woke and reported feeling improvement from the previous day, though he still felt as if he was still not at his baseline.  He still reported some discomfort in his chest.  BP 134/97 with HR 58 bpm.  He reported the CP is similar to that before his NSTEMI, yet not as severe.  He continued to report palpitations or thumping in his chest.  He was wearing his cardiac monitor and had pressed the trigger 3 times now, including last night today.  He continued to report some bendopnea, mostly occurring after taking his medicine.  He continued to note swelling, as well as sock line.  He continued to have upper epigastric pain once in a while and feelings of being bloated, despite not yet eating.  EKG obtained and without acute ST/T changes but still showing accelerated junctional rhythm.  Case reviewed with DOD and interventional list from his recent cardiac catheterization.  Based on his catheterization images and EKG, recommendation was for stat troponin to ensure high-sensitivity troponin downtrending.  In addition, MPI was recommended.  It was agreed that cardiac monitoring was of benefit, as well as work-up of possible sleep apnea.  STOP-BANG Rex assessment  S-snore  Have you been told that you snore?   1  T-tired Are you often tired, fatigued, sleepy during the day?   1  O-obstruction Do you stop breathing, choke, or gasp during sleep?   0  P-pressure Do you have or are you being treated  for high blood pressure?  1  B- BMI Is your BMI greater than 35 kg/m?  0  A- age Are you 81 years or older?  1  N-neck Do you have a neck circumference greater than 16 inches?  0  G-gender Are you male?  1  Total  5    Home Medications   Current Outpatient Medications  Medication Instructions   aspirin (ASPIRIN LOW DOSE) 81 mg, Oral, Daily, Swallow whole.   atorvastatin (LIPITOR) 10 mg, Oral, Daily   carvedilol (COREG) 3.125 mg, Oral, 2 times daily with meals   cetirizine (ZYRTEC) 10 mg, Oral, Daily   clopidogrel (PLAVIX) 75 mg, Oral, Daily with breakfast   Continuous Blood Gluc Sensor (FREESTYLE LIBRE 14 DAY SENSOR) MISC 1 each, Does not apply, Every 21 days   empagliflozin (JARDIANCE) 10 mg, Oral, Daily before breakfast   escitalopram (LEXAPRO) 10 mg, Oral, Daily   ezetimibe (ZETIA) 10 mg, Oral, Daily   fenofibrate (TRICOR) 145 MG tablet TAKE 1 TABLET BY MOUTH  DAILY   irbesartan (AVAPRO) 150 mg, Oral, Daily   levothyroxine (SYNTHROID) 150 mcg, Oral, Daily before breakfast   metFORMIN (GLUCOPHAGE XR) 500 mg, Oral, Daily with breakfast   Multiple Vitamins-Minerals (MULTIVITAMIN ADULT) CHEW 1 tablet, Oral, Daily   nitroGLYCERIN (NITROSTAT) 0.4 mg, Sublingual, Every 5 min PRN   PROAIR RESPICLICK 016 (90 Base) MCG/ACT AEPB USE 2 INHALATIONS BY MOUTH  EVERY 6 HOURS AS NEEDED   tadalafil (CIALIS) 5 mg, Oral, Daily     Review of Systems    He reports chest discomfort that begins in the jaw/throat and extends down to the chest with exertion.  CP is described as a pressure and similar to that before his non-STEMI, yet not as severe.  He has had 2 episodes since 08/13/2020, both occurring with exertion and resolving with rest.  At the time of this clinic visit, he continues to have a discomfort or feeling of not yet being at baseline in his chest.  He continues to note upper epigastric pain, bendopnea, mild lower extremity edema with sock line, bloating/abdominal distention not  associated with food.  He reports inconsistent diet, though recently hungry.  He denies pnd, orthopnea, , v, syncope, weight gain.  As noted at his previous visit, he reports dyspnea with minimal exertion, such as walking.  He reports fatigue and weakness. He reports palpitations.  All other systems reviewed and are otherwise negative except as noted above.  Physical Exam    VS:  BP (!) 138/100 (BP Location: Right Arm, Patient Position: Sitting, Cuff Size: Normal)   Pulse 62   Ht 5\' 10"  (1.778 m)   Wt 219 lb 6 oz (99.5 kg)   SpO2 98%   BMI 31.48 kg/m  , BMI Body mass index is 31.48 kg/m. GEN: Well nourished, well developed, in no acute distress. HEENT: normal. Neck: Supple, no JVD, carotid bruits, or masses. Cardiac: RRR, no murmurs, rubs, or gallops. No clubbing, cyanosis, mild bilateral non-pitting edema.  Radials/DP/PT 2+ and equal bilaterally.  Respiratory:  Respirations regular and unlabored, clear to auscultation bilaterally. GI: Soft, nontender, nondistended, BS + x 4. MS: no deformity or atrophy. Skin: warm and dry, no rash. Neuro:  Strength and sensation are intact. Psych: Normal affect.  Accessory Clinical Findings    ECG personally reviewed by me today - Accelerated junctional rhythm with PACs and PVCs (rhythm strip also performed), 62bpm, poor R wave progression throughout leads, right bundle branch block with QRS 142 ms and QTC 458 ms--reviewed with DOD same day and before patient left the office- no acute changes.  VITALS Reviewed today   Temp Readings from Last 3 Encounters:  07/31/20 98.5 F (36.9 C)  07/29/20 98.1 F (36.7 C)  03/18/20 98.6 F (37 C) (Oral)  BP Readings from Last 3 Encounters:  08/14/20 (!) 138/100  08/11/20 130/80  07/31/20 120/88   Pulse Readings from Last 3 Encounters:  08/14/20 62  08/11/20 65  07/31/20 (!) 56    Wt Readings from Last 3 Encounters:  08/14/20 219 lb 6 oz (99.5 kg)  08/11/20 220 lb (99.8 kg)  07/31/20 219 lb 6.4  oz (99.5 kg)     LABS  reviewed today   Lab Results  Component Value Date   WBC 8.1 07/28/2020   HGB 15.1 07/28/2020   HCT 43.9 07/28/2020   MCV 84.7 07/28/2020   PLT 160 07/28/2020   Lab Results  Component Value Date   CREATININE 1.19 08/11/2020   BUN 15 08/11/2020   NA 142 08/11/2020   K 4.2 08/11/2020   CL 106 08/11/2020   CO2 20 08/11/2020   Lab Results  Component Value Date   ALT 32 07/27/2020   AST 63 (H) 07/27/2020   ALKPHOS 45 07/27/2020   BILITOT 0.9 07/27/2020   Lab Results  Component Value Date   CHOL 207 (H) 07/26/2020   HDL 41 07/26/2020   LDLCALC 138 (H) 07/26/2020   TRIG 141 07/26/2020   CHOLHDL 5.0 07/26/2020    Lab Results  Component Value Date   HGBA1C 7.4 (H) 07/25/2020   Lab Results  Component Value Date   TSH 3.300 08/11/2020     STUDIES/PROCEDURES reviewed today   Echo 07/28/20  1. Left ventricular ejection fraction, by estimation, is 35 to 40%. The  left ventricle has moderately decreased function. The left ventricle  demonstrates regional wall motion abnormalities (Hypokinesis of the  anterior and anteroseptal wall). There is  moderate left ventricular hypertrophy. Left ventricular diastolic  parameters are consistent with Grade I diastolic dysfunction (impaired  relaxation).   2. Right ventricular systolic function is normal. The right ventricular  size is normal.   3. Left atrial size was mildly dilated.   4. Frequent PVCs   LHC 07/28/20 Wall Motion  Resting                 Left Heart  Left Ventricle There is moderate left ventricular systolic dysfunction. LV end diastolic pressure is normal. The left ventricular ejection fraction is 35-45% by visual estimate. There are LV function abnormalities due to global hypokinesis.    Coronary Diagrams   Diagnostic Dominance: Right      Ramus lesion is 100% stenosed. Ost Cx to Prox Cx lesion is 85% stenosed. Prox LAD to Mid LAD lesion is 100% stenosed. 1st Diag  lesion is 80% stenosed. There is moderate left ventricular systolic dysfunction. The left ventricular ejection fraction is 35-45% by visual estimate. LV end diastolic pressure is normal. Origin to Prox Graft lesion is 50% stenosed. RPDA lesion is 60% stenosed. RIMA graft was visualized by angiography and is normal in caliber. LIMA graft was visualized by angiography and is normal in caliber. The graft exhibits no disease. Mid LAD lesion is 85% stenosed. 1.  Severe underlying three-vessel coronary artery disease with patent grafts including LIMA to LAD, RIMA to ramus and SVG to right PDA.  There is significant mid to distal LAD stenosis proximal to the LIMA anastomosis, significant ostial left circumflex disease but the left circumflex distribution is relatively small.  There is moderate proximal disease affecting the SVG to right PDA. 2.  Moderately reduced LV systolic function with an EF of 35 to 40% with global hypokinesis. 3.  Normal left ventricular end-diastolic pressure. Recommendations: No  clear culprit is identified for the patient's non-ST elevation myocardial infarction.  Vessel occlusion with spontaneous recanalization is a possibility especially in the SVG to right PDA. Discontinue heparin for now.  I elected to add clopidogrel for 1 year. Obtain an echocardiogram to better evaluate ejection fraction. I resumed small dose carvedilol given cardiomyopathy and PVCs.  Monitor for bradycardia. In the future if the patient has chronic anginal symptoms.  Nuclear stress testing can be considered to see if there are areas of ischemia and guide revascularization if needed.   02/08/2014 MPI Impression Exercise Capacity:  Good exercise capacity. BP Response:  Normal blood pressure response. Clinical Symptoms:  No chest pain or dyspnea. ECG Impression:  No significant ST segment change suggestive of ischemia. Comparison with Prior Nuclear Study: Inferior ischemia new compared to  previous Overall Impression:  Low risk stress nuclear study with a small, moderate intensity, partially reversible basal inferior lateral defect consistent with soft tissue attenuation and mild ischemia in the inferior basal wall.. LV Wall Motion:  NL LV Function; NL Wall Motion   Assessment & Plan    Exertional Angina with History of 06/2020 NSTEMI Coronary artery disease s/p lateral MI (2000) and CABG x4 (01/2001) ---Seen today as, since his 6/13 visit, pt with 2 exertional episodes of CP as in HPI and similar in character to that of his recent NSTEMI but less severe in intensity.  ---EKG today still shows accelerated junctional rhythm and without acute ST/T changes.  ---STAT HS Tn today 22 and down-trended from admission (pt called same day, notified).  ---Case discussed today with DOD / internationalist that performed pt's catheterization. 5/30 LHC: severe CAD, patent grafts, unclear culprit for NSTEMI.  5/30 echo EF 35 to 40%, hypokinesis of anterior/anteroseptal wall, moderate LVH, G1 DD, mild LAE, frequent PVCs. It was thought possible that vessel occlusion occurred with spontaneous recanalization in the SVG to rPDA. Past cardiac hx includes lateral MI (2000) & CABG x4 in 2002 with subsequent cardiac tamponade and need for window.   [place] ordered -rule out high risk ischemia +/- repeat catheterization. Discontinued Coreg given accelerated junctional rhythm, h/o junctional bradycardia and per DOD. Continued ASA & clopidogrel, statin, PRN SL nitro.  Pt not taking Cialis & aware of interaction with nitrates.   He has not yet tried to use SL nitro yet for his CP.  Combined systolic and diastolic heart failure Ischemic cardiomyopathy --Continues to report symptoms consistent with volume overload as in HPI.  LVEF 35-40% as outlined above on recent echo. Wt up from discharge with consideration of different scales but relatively unchanged from 6/13. DBP significantly elevated today.  Suspect that he may be at least mildly volume up and benefit from at least a few days diuresis.  Start torsemide 10mg  qd- given ongoing sx of volume overload reported, started low dose diuresis to see if increased urine output, improved sx, and improvement in pressures. BMET today, BMET at RTC. Discontinued Coreg as above and after case discussion with DOD. Continue Irbesartan 150mg  daily, Jardiance.   HF education provided by visiting advanced HF RN from [place], [place]. Future considerations discussed, including plan to transition to [facility] in the future if LVEF does not improve on repeat echo. If EF remains reduced and Cr/K permit, Spironolactone could also be considered at that time. Will defer for now. Pending the above results, plan for repeat echo in 3-6 months as previously discussed.   Palpitations, dizziness PVCs, junctional rhythms --Reports dyspnea, fatigue, palpitations and sx as above.  EKG this week with accelerated junctional rhythm. Admission telemetry with junctional bradycardia. PVCs noted in workup. Continue Zio XT, placed 6/13, to rule out significant arrhythmia or high burden of ectopy as contributing. WatchPat ordered given snoring and junctional bradycardia during admission= pt STOPGAP score 5.  Repeat BMET as above today and at RTC. Previous TSH wnl this week. Coreg discontinued as above.  HLD, LDL goal <70 Hypertriglyceridemia History of statin intolerance (with higher statin doses) --Intolerant to high intensity statin therapy and myalgias with Crestor.   --Started on atorvastatin 10 mg daily, tolerating this well. --07/26/20 labs show total cholesterol 207, LDL 138, HDL 41, Tg 141. --Current LDL above goal, Tg at goal.  --Current AST elevated as below, ALT wnl. --Continue atorvastatin with Zetia, continue fenofibrate. --Recheck lipid and liver function ~08/2020. --Consider PCSK9i in the future if needed for LDL control below 70.  Transaminitis --Noted during  admission with AST 63. ALT wnl. We will plan for recheck AST and 6 to 8 weeks or ~08/2020. Continue statin for now.    HTN, goal BP 130/80 or lower --BP with elevated DBP from previous.  Continue irbesartan. Discontinued carvedilol. Started low dose torsemide. Keep total daily fluid under 2 L/day and salt under 2 g/day.  Monitor BP at home with goal BP 130/80 or lower.  DM2 --Hemoglobin A1C 7.1 03/2020. Continue Jardiance in the setting of HFrEF.   Hypothyroidism  -- TSH wnl.  Continue Synthroid.  Vape use --Complete cessation advised.    Sleep disordered breathing --WatchPat ordered as above.   ----------------------------------------------------------  Medication changes:  --Stop Coreg. --Start Torsemide 10mg  qd. Labs ordered:  --STAT HS Tn --BMET today, 1-2 weeks Studies / Imaging ordered:  --WatchPat --MPI --Already ordered: Zio XT x2 weeks Future considerations:  --BMET at RTC --Repeat echo --Mearl Latin, PCSKI9i if needed --Cath if needed after MPI  Disposition: RTC 1 week given start of diuresis and CP  *Please be aware that the above documentation was completed voice recognition software and may contain dictation errors.       Arvil Chaco, PA-C 08/14/2020

## 2020-08-14 NOTE — Telephone Encounter (Signed)
Pt c/o of Chest Pain: STAT if CP now or developed within 24 hours  1. Are you having CP right now? no  2. Are you experiencing any other symptoms (ex. SOB, nausea, vomiting, sweating)? Last night with some exertion patient had pain in jaw and a little chest discomfort - no chest pain  3. How long have you been experiencing CP? Happened last night - only with exertion, otherwise feels fine   4. Is your CP continuous or coming and going? Coming and going  5. Have you taken Nitroglycerin?   Patient wanted to make office aware since some of these symptoms were similar to when he had heart attack. ?

## 2020-08-15 ENCOUNTER — Encounter: Payer: Self-pay | Admitting: Physician Assistant

## 2020-08-18 ENCOUNTER — Telehealth: Payer: Self-pay | Admitting: *Deleted

## 2020-08-18 DIAGNOSIS — I25118 Atherosclerotic heart disease of native coronary artery with other forms of angina pectoris: Secondary | ICD-10-CM

## 2020-08-18 DIAGNOSIS — I5020 Unspecified systolic (congestive) heart failure: Secondary | ICD-10-CM

## 2020-08-18 DIAGNOSIS — Z79899 Other long term (current) drug therapy: Secondary | ICD-10-CM

## 2020-08-18 DIAGNOSIS — I252 Old myocardial infarction: Secondary | ICD-10-CM

## 2020-08-18 MED ORDER — ENTRESTO 49-51 MG PO TABS: 1.0000 | ORAL_TABLET | Freq: Two times a day (BID) | ORAL | 0 refills | Status: DC

## 2020-08-18 NOTE — Telephone Encounter (Signed)
Left voicemail message to call back for review of results and recommendations.  

## 2020-08-18 NOTE — Telephone Encounter (Signed)
PA request for [place] faxed to [facility] [address] @ [phone].

## 2020-08-18 NOTE — Telephone Encounter (Signed)
-----   Message from Arvil Chaco, PA-C sent at 08/13/2020 10:40 PM EDT ----- Results show --Stable renal function. --Potassium at goal --TSH now wnl  Recommendations: Continue to monitor his symptoms. I had planned to start Entresto at his next visit. Since this was made with Dr. Rockey Situ, it would be preferable to start [facility] now given his reduced pump function on his echo and plenty of room in BP. If agreeable, please have him discontinue his irbesartan 150mg  and start Entresto 49-51 BID. We will need a repeat BMET in 2 weeks. I want him to start this medication before his upcoming visit with Dr. Rockey Situ, so that his GDMT can be escalated as tolerated at that visit if needed, given his reduced pump function.

## 2020-08-18 NOTE — Telephone Encounter (Signed)
Results and recommendations reviewed with patient. Instructed him to stop Irbesartan then 3 days after that start Entresto 49-51 mg twice a day. Reviewed that in 2 weeks after starting medication to have repeat labs done over at the [facility]. Advised that 30 day prescription is free and then we can send in 90 day supply to use with copay card for $10. He verbalized understanding of our conversation, instructions with medication, repeat labs, and had no further questions at this time. He did request sending instructions via My Chart as well.

## 2020-08-18 NOTE — Telephone Encounter (Signed)
-----   Message from Solmon Ice, RN sent at 08/14/2020  3:47 PM EDT ----- Regarding: Todd [employer] Pt given [place] home sleep study today in the office.  Please send for precert.  Dx: Sleep disordered breathing

## 2020-08-19 ENCOUNTER — Other Ambulatory Visit: Payer: Self-pay

## 2020-08-19 ENCOUNTER — Encounter: Payer: No Typology Code available for payment source | Admitting: *Deleted

## 2020-08-19 VITALS — Ht 70.2 in | Wt 218.0 lb

## 2020-08-19 DIAGNOSIS — I214 Non-ST elevation (NSTEMI) myocardial infarction: Secondary | ICD-10-CM

## 2020-08-19 DIAGNOSIS — Z5189 Encounter for other specified aftercare: Secondary | ICD-10-CM | POA: Diagnosis present

## 2020-08-19 DIAGNOSIS — I252 Old myocardial infarction: Secondary | ICD-10-CM | POA: Diagnosis not present

## 2020-08-19 NOTE — Progress Notes (Signed)
Cardiac Individual Treatment Plan  Patient Details  Name: Todd Kelley MRN: [id number] Date of Birth: Mar 22, 1965 Referring Provider:   Flowsheet Row Cardiac Rehab from 08/19/2020 in [facility] Cardiac and Pulmonary Rehab  Referring Provider Kathlyn Sacramento MD  [facility] Cardiologist: Dr. Ida Rogue       Initial Encounter Date:  Flowsheet Row Cardiac Rehab from 08/19/2020 in [facility] Cardiac and Pulmonary Rehab  Date 08/19/20       Visit Diagnosis: NSTEMI (non-ST elevated myocardial infarction) [facility])  Patient's Home Medications on Admission:  Current Outpatient Medications:    aspirin (ASPIRIN LOW DOSE) 81 MG EC tablet, Take 1 tablet (81 mg total) by mouth daily. Swallow whole., Disp: 90 tablet, Rfl: 3   atorvastatin (LIPITOR) 10 MG tablet, Take 1 tablet (10 mg total) by mouth daily., Disp: 30 tablet, Rfl: 1   cetirizine (ZYRTEC) 10 MG tablet, Take 10 mg by mouth daily., Disp: , Rfl:    clopidogrel (PLAVIX) 75 MG tablet, Take 1 tablet (75 mg total) by mouth daily with breakfast., Disp: 30 tablet, Rfl: 1   Continuous Blood Gluc Sensor (FREESTYLE LIBRE 14 DAY SENSOR) MISC, 1 each by Does not apply route every 21 ( twenty-one) days., Disp: 2 each, Rfl: 11   empagliflozin (JARDIANCE) 10 MG TABS tablet, Take 1 tablet (10 mg total) by mouth daily before breakfast., Disp: 30 tablet, Rfl: 2   escitalopram (LEXAPRO) 10 MG tablet, Take 1 tablet (10 mg total) by mouth daily., Disp: 90 tablet, Rfl: 3   ezetimibe (ZETIA) 10 MG tablet, Take 1 tablet (10 mg total) by mouth daily., Disp: 30 tablet, Rfl: 1   fenofibrate (TRICOR) 145 MG tablet, TAKE 1 TABLET BY MOUTH  DAILY, Disp: 90 tablet, Rfl: 0   levothyroxine (SYNTHROID) 150 MCG tablet, Take 1 tablet (150 mcg total) by mouth daily before breakfast., Disp: 90 tablet, Rfl: 1   metFORMIN (GLUCOPHAGE XR) 500 MG 24 hr tablet, Take 1 tablet (500 mg total) by mouth daily with breakfast., Disp: 30 tablet, Rfl: 11   Multiple Vitamins-Minerals (MULTIVITAMIN ADULT)  CHEW, Chew 1 tablet by mouth daily., Disp: , Rfl:    nitroGLYCERIN (NITROSTAT) 0.4 MG SL tablet, Place 1 tablet (0.4 mg total) under the tongue every 5 (five) minutes as needed for chest pain., Disp: 25 tablet, Rfl: 2   PROAIR RESPICLICK 381 (90 Base) MCG/ACT AEPB, USE 2 INHALATIONS BY MOUTH  EVERY 6 HOURS AS NEEDED, Disp: 1 each, Rfl: 0   sacubitril-valsartan (ENTRESTO) 49-51 MG, Take 1 tablet by mouth 2 (two) times daily., Disp: 60 tablet, Rfl: 0   tadalafil (CIALIS) 5 MG tablet, Take 1 tablet (5 mg total) by mouth daily., Disp: 90 tablet, Rfl: 3   torsemide (DEMADEX) 10 MG tablet, Take 1 tablet (10 mg total) by mouth daily., Disp: 30 tablet, Rfl: 5  Past Medical History: Past Medical History:  Diagnosis Date   Acid reflux    Allergic asthma    rare flare up, worse with alllergies as of 12/2016   Coronary artery disease    a. 08/1998 Lateral MI/PCI/BMS [place]; b. 2001 PTCA x 2 2/2 ISR in [place]; c. 2002 PCI/brachyrx 2/2 ISR in [place]; d. 01/2001 CABG x 4: LIMA->LAD, RIMA->RI, VG->RPL->RPDA; e. 01/2014 MV: Low risk w/ attenuation artifact and mild inf basal isch. Nl EF->Med rx.   DVT (deep venous thrombosis) ([place])    a. 01/2001 after CABG.   Erectile dysfunction    Hyperlipidemia LDL goal <70    Hypertension    Hypothyroidism  Palpitations    Pericardial effusion    a. 01/2001 Effusion w/ tamponade req window following CABG.   Tobacco use    vape, no cigarrette since 04/2015   Type II diabetes mellitus ([place])     Tobacco Use: Social History   Tobacco Use  Smoking Status Former   Packs/day: 0.50   Years: 10.00   Pack years: 5.00   Types: Cigarettes   Quit date: 04/08/2015   Years since quitting: 5.3  Smokeless Tobacco Never    Labs: Recent Review Flowsheet Data     Labs for ITP Cardiac and Pulmonary Rehab Latest Ref Rng & Units 03/09/2019 12/27/2019 03/14/2020 07/25/2020 07/26/2020   Cholestrol 0 - 200 mg/dL - 137 125 - 207(H)   LDLCALC 0 - 99 mg/dL - 80 70 - 138(H)   HDL >40 mg/dL -  37(L) 39(L) - 41   Trlycerides <150 mg/dL - 105 83 - 141   Hemoglobin A1c 4.8 - 5.6 % 7.1(H) 7.2(A) 7.2(H) 7.4(H) -        Exercise Target Goals: Exercise Program Goal: Individual exercise prescription set using results from initial 6 min walk test and THRR while considering  patient's activity barriers and safety.   Exercise Prescription Goal: Initial exercise prescription builds to 30-45 minutes a day of aerobic activity, 2-3 days per week.  Home exercise guidelines will be given to patient during program as part of exercise prescription that the participant will acknowledge.   Education: Aerobic Exercise: - Group verbal and visual presentation on the components of exercise prescription. Introduces F.I.T.T principle from ACSM for exercise prescriptions.  Reviews F.I.T.T. principles of aerobic exercise including progression. Written material given at graduation.   Education: Resistance Exercise: - Group verbal and visual presentation on the components of exercise prescription. Introduces F.I.T.T principle from ACSM for exercise prescriptions  Reviews F.I.T.T. principles of resistance exercise including progression. Written material given at graduation.    Education: Exercise & Equipment Safety: - Individual verbal instruction and demonstration of equipment use and safety with use of the equipment. Flowsheet Row Cardiac Rehab from 08/19/2020 in [facility] Cardiac and Pulmonary Rehab  Date 08/19/20  Educator [facility]  Instruction Review Code 1- Verbalizes Understanding       Education: Exercise Physiology & General Exercise Guidelines: - Group verbal and written instruction with models to review the exercise physiology of the cardiovascular system and associated critical values. Provides general exercise guidelines with specific guidelines to those with heart or lung disease.    Education: Flexibility, Balance, Mind/Body Relaxation: - Group verbal and visual presentation with interactive  activity on the components of exercise prescription. Introduces F.I.T.T principle from ACSM for exercise prescriptions. Reviews F.I.T.T. principles of flexibility and balance exercise training including progression. Also discusses the mind body connection.  Reviews various relaxation techniques to help reduce and manage stress (i.e. Deep breathing, progressive muscle relaxation, and visualization). Balance handout provided to take home. Written material given at graduation.   Activity Barriers & Risk Stratification:  Activity Barriers & Cardiac Risk Stratification - 08/19/20 0934       Activity Barriers & Cardiac Risk Stratification   Activity Barriers Chest Pain/Angina;Muscular Weakness;Shortness of Breath;Deconditioning;Joint Problems   tweaked hip earlier this year, occasional pain   Cardiac Risk Stratification High             6 Minute Walk:  6 Minute Walk     Row Name 08/19/20 0932         6 Minute Walk   Phase Initial  Distance 1470 feet     Walk Time 6 minutes     # of Rest Breaks 0     MPH 2.78     METS 3.61     RPE 8     VO2 Peak 12.62     Symptoms No     Resting HR 64 bpm     Resting BP 126/66     Resting Oxygen Saturation  98 %     Exercise Oxygen Saturation  during 6 min walk 96 %     Max Ex. HR 79 bpm     Max Ex. BP 136/70              Oxygen Initial Assessment:   Oxygen Re-Evaluation:   Oxygen Discharge (Final Oxygen Re-Evaluation):   Initial Exercise Prescription:  Initial Exercise Prescription - 08/19/20 0900       Date of Initial Exercise RX and Referring Provider   Date 08/19/20    Referring Provider Kathlyn Sacramento MD   Primary Cardiologist: Dr. Ida Rogue     Treadmill   MPH 2.7    Grade 1    Minutes 15    METs 3.44      Recumbant Bike   Level 4    RPM 50    Watts 50    Minutes 15    METs 3.5      Arm Ergometer   Level 2    RPM 25    Minutes 15    METs 3      Recumbant Elliptical   Level 2    RPM 50     Minutes 15    METs 3      REL-XR   Level 3    Speed 50    Minutes 15    METs 3      Biostep-RELP   Level 4    SPM 50    Minutes 15    METs 3      Track   Laps 40    Minutes 15    METs 3.2      Prescription Details   Frequency (times per week) 3    Duration Progress to 30 minutes of continuous aerobic without signs/symptoms of physical distress      Intensity   THRR 40-80% of Max Heartrate 104-144    Ratings of Perceived Exertion 11-13    Perceived Dyspnea 0-4      Progression   Progression Continue to progress workloads to maintain intensity without signs/symptoms of physical distress.      Resistance Training   Training Prescription Yes    Weight 4 lb    Reps 10-15             Perform Capillary Blood Glucose checks as needed.  Exercise Prescription Changes:   Exercise Prescription Changes     Row Name 08/19/20 0900             Response to Exercise   Blood Pressure (Admit) 126/66       Blood Pressure (Exercise) 136/70       Blood Pressure (Exit) 120/62       Heart Rate (Admit) 64 bpm       Heart Rate (Exercise) 79 bpm       Heart Rate (Exit) 66 bpm       Oxygen Saturation (Admit) 98 %       Oxygen Saturation (Exercise) 96 %       Rating of Perceived  Exertion (Exercise) 8       Symptoms none       Comments walk test results                Exercise Comments:   Exercise Goals and Review:   Exercise Goals     Row Name 08/19/20 0939             Exercise Goals   Increase Physical Activity Yes       Intervention Provide advice, education, support and counseling about physical activity/exercise needs.;Develop an individualized exercise prescription for aerobic and resistive training based on initial evaluation findings, risk stratification, comorbidities and participant's personal goals.       Expected Outcomes Short Term: Attend rehab on a regular basis to increase amount of physical activity.;Long Term: Add in home exercise to make  exercise part of routine and to increase amount of physical activity.;Long Term: Exercising regularly at least 3-5 days a week.       Increase Strength and Stamina Yes       Intervention Provide advice, education, support and counseling about physical activity/exercise needs.;Develop an individualized exercise prescription for aerobic and resistive training based on initial evaluation findings, risk stratification, comorbidities and participant's personal goals.       Expected Outcomes Short Term: Increase workloads from initial exercise prescription for resistance, speed, and METs.;Long Term: Improve cardiorespiratory fitness, muscular endurance and strength as measured by increased METs and functional capacity (6MWT);Short Term: Perform resistance training exercises routinely during rehab and add in resistance training at home       Able to understand and use rate of perceived exertion (RPE) scale Yes       Intervention Provide education and explanation on how to use RPE scale       Expected Outcomes Short Term: Able to use RPE daily in rehab to express subjective intensity level;Long Term:  Able to use RPE to guide intensity level when exercising independently       Able to understand and use Dyspnea scale Yes       Intervention Provide education and explanation on how to use Dyspnea scale       Expected Outcomes Short Term: Able to use Dyspnea scale daily in rehab to express subjective sense of shortness of breath during exertion;Long Term: Able to use Dyspnea scale to guide intensity level when exercising independently       Knowledge and understanding of Target Heart Rate Range (THRR) Yes       Intervention Provide education and explanation of THRR including how the numbers were predicted and where they are located for reference       Expected Outcomes Short Term: Able to use daily as guideline for intensity in rehab;Short Term: Able to state/look up THRR;Long Term: Able to use THRR to govern  intensity when exercising independently       Able to check pulse independently Yes       Intervention Provide education and demonstration on how to check pulse in carotid and radial arteries.;Review the importance of being able to check your own pulse for safety during independent exercise       Expected Outcomes Short Term: Able to explain why pulse checking is important during independent exercise;Long Term: Able to check pulse independently and accurately       Understanding of Exercise Prescription Yes       Intervention Provide education, explanation, and written materials on patient's individual exercise prescription  Expected Outcomes Short Term: Able to explain program exercise prescription;Long Term: Able to explain home exercise prescription to exercise independently                Exercise Goals Re-Evaluation :   Discharge Exercise Prescription (Final Exercise Prescription Changes):  Exercise Prescription Changes - 08/19/20 0900       Response to Exercise   Blood Pressure (Admit) 126/66    Blood Pressure (Exercise) 136/70    Blood Pressure (Exit) 120/62    Heart Rate (Admit) 64 bpm    Heart Rate (Exercise) 79 bpm    Heart Rate (Exit) 66 bpm    Oxygen Saturation (Admit) 98 %    Oxygen Saturation (Exercise) 96 %    Rating of Perceived Exertion (Exercise) 8    Symptoms none    Comments walk test results             Nutrition:  Target Goals: Understanding of nutrition guidelines, daily intake of sodium 1500mg , cholesterol 200mg , calories 30% from fat and 7% or less from saturated fats, daily to have 5 or more servings of fruits and vegetables.  Education: All About Nutrition: -Group instruction provided by verbal, written material, interactive activities, discussions, models, and posters to present general guidelines for heart healthy nutrition including fat, fiber, MyPlate, the role of sodium in heart healthy nutrition, utilization of the nutrition label,  and utilization of this knowledge for meal planning. Follow up email sent as well. Written material given at graduation. Flowsheet Row Cardiac Rehab from 08/19/2020 in [facility] Cardiac and Pulmonary Rehab  Education need identified 08/19/20       Biometrics:  Pre Biometrics - 08/19/20 0940       Pre Biometrics   Height 5' 10.2" (1.783 m)    Weight 218 lb (98.9 kg)    BMI (Calculated) 31.1    Single Leg Stand 30 seconds              Nutrition Therapy Plan and Nutrition Goals:  Nutrition Therapy & Goals - 08/19/20 0941       Intervention Plan   Intervention Prescribe, educate and counsel regarding individualized specific dietary modifications aiming towards targeted core components such as weight, hypertension, lipid management, diabetes, heart failure and other comorbidities.    Expected Outcomes Short Term Goal: Understand basic principles of dietary content, such as calories, fat, sodium, cholesterol and nutrients.;Short Term Goal: A plan has been developed with personal nutrition goals set during dietitian appointment.;Long Term Goal: Adherence to prescribed nutrition plan.             Nutrition Assessments:  MEDIFICTS Score Key: ?70 Need to make dietary changes  40-70 Heart Healthy Diet ? 40 Therapeutic Level Cholesterol Diet  Flowsheet Row Cardiac Rehab from 08/19/2020 in [facility] Cardiac and Pulmonary Rehab  Picture Your Plate Total Score on Admission 67      Picture Your Plate Scores: <09 Unhealthy dietary pattern with much room for improvement. 41-50 Dietary pattern unlikely to meet recommendations for good health and room for improvement. 51-60 More healthful dietary pattern, with some room for improvement.  >60 Healthy dietary pattern, although there may be some specific behaviors that could be improved.    Nutrition Goals Re-Evaluation:   Nutrition Goals Discharge (Final Nutrition Goals Re-Evaluation):   Psychosocial: Target Goals: Acknowledge presence  or absence of significant depression and/or stress, maximize coping skills, provide positive support system. Participant is able to verbalize types and ability to use techniques and skills needed for reducing  stress and depression.   Education: Stress, Anxiety, and Depression - Group verbal and visual presentation to define topics covered.  Reviews how body is impacted by stress, anxiety, and depression.  Also discusses healthy ways to reduce stress and to treat/manage anxiety and depression.  Written material given at graduation.   Education: Sleep Hygiene -Provides group verbal and written instruction about how sleep can affect your health.  Define sleep hygiene, discuss sleep cycles and impact of sleep habits. Review good sleep hygiene tips.    Initial Review & Psychosocial Screening:  Initial Psych Review & Screening - 08/11/20 1536       Initial Review   Current issues with Current Stress Concerns      Family Dynamics   Good Support System? Yes   wife, dad, son     Barriers   Psychosocial barriers to participate in program There are no identifiable barriers or psychosocial needs.;The patient should benefit from training in stress management and relaxation.      Screening Interventions   Interventions Encouraged to exercise;To provide support and resources with identified psychosocial needs;Provide feedback about the scores to participant    Expected Outcomes Short Term goal: Utilizing psychosocial counselor, staff and physician to assist with identification of specific Stressors or current issues interfering with healing process. Setting desired goal for each stressor or current issue identified.;Long Term Goal: Stressors or current issues are controlled or eliminated.;Short Term goal: Identification and review with participant of any Quality of Life or Depression concerns found by scoring the questionnaire.;Long Term goal: The participant improves quality of Life and PHQ9 Scores as  seen by post scores and/or verbalization of changes             Quality of Life Scores:   Quality of Life - 08/19/20 0941       Quality of Life   Select Quality of Life      Quality of Life Scores   Health/Function Pre 23.87 %    Socioeconomic Pre 26 %    Psych/Spiritual Pre 26.64 %    Family Pre 27.6 %    GLOBAL Pre 25.44 %            Scores of 19 and below usually indicate a poorer quality of life in these areas.  A difference of  2-3 points is a clinically meaningful difference.  A difference of 2-3 points in the total score of the Quality of Life Index has been associated with significant improvement in overall quality of life, self-image, physical symptoms, and general health in studies assessing change in quality of life.  PHQ-9: Recent Review Flowsheet Data     Depression screen [facility] 2/9 08/19/2020 03/14/2020 03/09/2019 01/16/2018 01/14/2017   Decreased Interest 1 0 0 0 0   Down, Depressed, Hopeless 0 0 0 0 0   PHQ - 2 Score 1 0 0 0 0   Altered sleeping 0 - - - -   Tired, decreased energy 1 - - - -   Change in appetite 0 - - - -   Feeling bad or failure about yourself  0 - - - -   Trouble concentrating 0 - - - -   Moving slowly or fidgety/restless 0 - - - -   Suicidal thoughts 0 - - - -   PHQ-9 Score 2 - - - -   Difficult doing work/chores Not difficult at all - - - -      Interpretation of Total Score  Total Score  Depression Severity:  1-4 = Minimal depression, 5-9 = Mild depression, 10-14 = Moderate depression, 15-19 = Moderately severe depression, 20-27 = Severe depression   Psychosocial Evaluation and Intervention:  Psychosocial Evaluation - 08/11/20 1551       Psychosocial Evaluation & Interventions   Interventions Encouraged to exercise with the program and follow exercise prescription;Stress management education;Relaxation education    Comments Bostyn reports doing okay after his NSTEMI. He is still struggling with feeling tired and low stamina, but is  trying to slowly increase his activity level. 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 He does have twin 50 year olds at home which keep him quite active. He states his wife, older son, and dad are his main support system. He takes Lexapro at night to help him sleep because his mind races sometimes. At his appointment today, his cardiologist placed a holter monitor to evaluate for heart abnormalities since he has been experiencing palpitations and tiredness. He is very motivated to care for his body and is looking forward to coming to cardiac rehab.    Expected Outcomes Short: attend cardiac rehab for education and exercise. Long: develop and maintain positive self care habits.    Continue Psychosocial Services  Follow up required by staff             Psychosocial Re-Evaluation:   Psychosocial Discharge (Final Psychosocial Re-Evaluation):   Vocational Rehabilitation: Provide vocational rehab assistance to qualifying candidates.   Vocational Rehab Evaluation & Intervention:  Vocational Rehab - 08/11/20 1537       Initial Vocational Rehab Evaluation & Intervention   Assessment shows need for Vocational Rehabilitation No             Education: Education Goals: Education classes will be provided on a variety of topics geared toward better understanding of heart health and risk factor modification. Participant will state understanding/return demonstration of topics presented as noted by education test scores.  Learning Barriers/Preferences:  Learning Barriers/Preferences - 08/11/20 1537       Learning Barriers/Preferences   Learning Barriers None    Learning Preferences None             General Cardiac Education Topics:  AED/CPR: - Group verbal and written instruction with the use of models to demonstrate the basic use of the AED with the basic ABC's of resuscitation.   Anatomy and Cardiac Procedures: - Group verbal and  visual presentation and models provide information about basic cardiac anatomy and function. Reviews the testing methods done to diagnose heart disease and the outcomes of the test results. Describes the treatment choices: Medical Management, Angioplasty, or Coronary Bypass Surgery for treating various heart conditions including Myocardial Infarction, Angina, Valve Disease, and Cardiac Arrhythmias.  Written material given at graduation.   Medication Safety: - Group verbal and visual instruction to review commonly prescribed medications for heart and lung disease. Reviews the medication, class of the drug, and side effects. Includes the steps to properly store meds and maintain the prescription regimen.  Written material given at graduation.   Intimacy: - Group verbal instruction through game format to discuss how heart and lung disease can affect sexual intimacy. Written material given at graduation..   Know Your Numbers and Heart Failure: - Group verbal and visual instruction to discuss disease risk factors for cardiac and pulmonary disease and treatment options.  Reviews associated critical values for Overweight/Obesity, Hypertension, Cholesterol, and Diabetes.  Discusses basics of heart  failure: signs/symptoms and treatments.  Introduces Heart Failure Zone chart for action plan for heart failure.  Written material given at graduation. Flowsheet Row Cardiac Rehab from 08/19/2020 in [facility] Cardiac and Pulmonary Rehab  Education need identified 08/19/20       Infection Prevention: - Provides verbal and written material to individual with discussion of infection control including proper hand washing and proper equipment cleaning during exercise session. Flowsheet Row Cardiac Rehab from 08/19/2020 in [facility] Cardiac and Pulmonary Rehab  Date 08/19/20  Educator [facility]  Instruction Review Code 1- Verbalizes Understanding       Falls Prevention: - Provides verbal and written material to individual with  discussion of falls prevention and safety. Flowsheet Row Cardiac Rehab from 08/19/2020 in [facility] Cardiac and Pulmonary Rehab  Date 08/19/20  Educator [facility]  Instruction Review Code 1- Verbalizes Understanding       Other: -Provides group and verbal instruction on various topics (see comments)   Knowledge Questionnaire Score:  Knowledge Questionnaire Score - 08/19/20 0942       Knowledge Questionnaire Score   Pre Score 24/26 Education Focus: Nutrition, Risk factors             Core Components/Risk Factors/Patient Goals at Admission:  Personal Goals and Risk Factors at Admission - 08/19/20 0942       Core Components/Risk Factors/Patient Goals on Admission    Weight Management Yes;Obesity;Weight Loss    Intervention Weight Management: Develop a combined nutrition and exercise program designed to reach desired caloric intake, while maintaining appropriate intake of nutrient and fiber, sodium and fats, and appropriate energy expenditure required for the weight goal.;Weight Management: Provide education and appropriate resources to help participant work on and attain dietary goals.;Weight Management/Obesity: Establish reasonable short term and long term weight goals.;Obesity: Provide education and appropriate resources to help participant work on and attain dietary goals.    Admit Weight 218 lb (98.9 kg)    Goal Weight: Short Term 210 lb (95.3 kg)    Goal Weight: Long Term 200 lb (90.7 kg)    Expected Outcomes Short Term: Continue to assess and modify interventions until short term weight is achieved;Long Term: Adherence to nutrition and physical activity/exercise program aimed toward attainment of established weight goal;Weight Loss: Understanding of general recommendations for a balanced deficit meal plan, which promotes 1-2 lb weight loss per week and includes a negative energy balance of [phone] kcal/d;Understanding recommendations for meals to include 15-35% energy as protein, 25-35%  energy from fat, 35-60% energy from carbohydrates, less than 200mg  of dietary cholesterol, 20-35 gm of total fiber daily;Understanding of distribution of calorie intake throughout the day with the consumption of 4-5 meals/snacks    Diabetes Yes    Intervention Provide education about signs/symptoms and action to take for hypo/hyperglycemia.;Provide education about proper nutrition, including hydration, and aerobic/resistive exercise prescription along with prescribed medications to achieve blood glucose in normal ranges: Fasting glucose 65-99 mg/dL    Expected Outcomes Short Term: Participant verbalizes understanding of the signs/symptoms and immediate care of hyper/hypoglycemia, proper foot care and importance of medication, aerobic/resistive exercise and nutrition plan for blood glucose control.;Long Term: Attainment of HbA1C < 7%.    Hypertension Yes    Intervention Provide education on lifestyle modifcations including regular physical activity/exercise, weight management, moderate sodium restriction and increased consumption of fresh fruit, vegetables, and low fat dairy, alcohol moderation, and smoking cessation.;Monitor prescription use compliance.    Expected Outcomes Short Term: Continued assessment and intervention until BP is < 140/73mm HG in  hypertensive participants. < 130/97mm HG in hypertensive participants with diabetes, heart failure or chronic kidney disease.;Long Term: Maintenance of blood pressure at goal levels.    Lipids Yes    Intervention Provide education and support for participant on nutrition & aerobic/resistive exercise along with prescribed medications to achieve LDL 70mg , HDL >40mg .    Expected Outcomes Short Term: Participant states understanding of desired cholesterol values and is compliant with medications prescribed. Participant is following exercise prescription and nutrition guidelines.;Long Term: Cholesterol controlled with medications as prescribed, with individualized  exercise RX and with personalized nutrition plan. Value goals: LDL < 70mg , HDL > 40 mg.             Education:Diabetes - Individual verbal and written instruction to review signs/symptoms of diabetes, desired ranges of glucose level fasting, after meals and with exercise. Acknowledge that pre and post exercise glucose checks will be done for 3 sessions at entry of program. [place] from 08/19/2020 in [facility]  Date 08/11/20  Educator [facility]  Instruction Review Code 1- Verbalizes Understanding       Core Components/Risk Factors/Patient Goals Review:    Core Components/Risk Factors/Patient Goals at Discharge (Final Review):    ITP Comments:  ITP Comments     Row Name 08/11/20 1554 08/19/20 0931         ITP Comments Initial telephone orientation completed. Diagnosis can be found in [facility] 5/27. EP orientation scheduled for Tuesday 6/21 at 8am. Completed 6MWT and gym orientation. Initial ITP created and sent for review to Dr. Emily Filbert, Medical Director.               Comments: Initial ITP

## 2020-08-19 NOTE — Patient Instructions (Signed)
Patient Instructions  Patient Details  Name: Todd Kelley MRN: [id number] Date of Birth: 1965-12-30 Referring Provider:  Wellington Hampshire, MD  Below are your personal goals for exercise, nutrition, and risk factors. Our goal is to help you stay on track towards obtaining and maintaining these goals. We will be discussing your progress on these goals with you throughout the program.  Initial Exercise Prescription:  Initial Exercise Prescription - 08/19/20 0900       Date of Initial Exercise RX and Referring Provider   Date 08/19/20    Referring Provider Kathlyn Sacramento MD   Primary Cardiologist: Dr. Ida Rogue     Treadmill   MPH 2.7    Grade 1    Minutes 15    METs 3.44      Recumbant Bike   Level 4    RPM 50    Watts 50    Minutes 15    METs 3.5      Arm Ergometer   Level 2    RPM 25    Minutes 15    METs 3      Recumbant Elliptical   Level 2    RPM 50    Minutes 15    METs 3      REL-XR   Level 3    Speed 50    Minutes 15    METs 3      Biostep-RELP   Level 4    SPM 50    Minutes 15    METs 3      Track   Laps 40    Minutes 15    METs 3.2      Prescription Details   Frequency (times per week) 3    Duration Progress to 30 minutes of continuous aerobic without signs/symptoms of physical distress      Intensity   THRR 40-80% of Max Heartrate 104-144    Ratings of Perceived Exertion 11-13    Perceived Dyspnea 0-4      Progression   Progression Continue to progress workloads to maintain intensity without signs/symptoms of physical distress.      Resistance Training   Training Prescription Yes    Weight 4 lb    Reps 10-15             Exercise Goals: Frequency: Be able to perform aerobic exercise two to three times per week in program working toward 2-5 days per week of home exercise.  Intensity: Work with a perceived exertion of 11 (fairly light) - 15 (hard) while following your exercise prescription.  We will make changes to your  prescription with you as you progress through the program.   Duration: Be able to do 30 to 45 minutes of continuous aerobic exercise in addition to a 5 minute warm-up and a 5 minute cool-down routine.   Nutrition Goals: Your personal nutrition goals will be established when you do your nutrition analysis with the dietician.  The following are general nutrition guidelines to follow: Cholesterol < 200mg /day Sodium < 1500mg /day Fiber: Men over 50 yrs - 30 grams per day  Personal Goals:  Personal Goals and Risk Factors at Admission - 08/19/20 0942       Core Components/Risk Factors/Patient Goals on Admission    Weight Management Yes;Obesity;Weight Loss    Intervention Weight Management: Develop a combined nutrition and exercise program designed to reach desired caloric intake, while maintaining appropriate intake of nutrient and fiber, sodium and fats, and appropriate energy  expenditure required for the weight goal.;Weight Management: Provide education and appropriate resources to help participant work on and attain dietary goals.;Weight Management/Obesity: Establish reasonable short term and long term weight goals.;Obesity: Provide education and appropriate resources to help participant work on and attain dietary goals.    Admit Weight 218 lb (98.9 kg)    Goal Weight: Short Term 210 lb (95.3 kg)    Goal Weight: Long Term 200 lb (90.7 kg)    Expected Outcomes Short Term: Continue to assess and modify interventions until short term weight is achieved;Long Term: Adherence to nutrition and physical activity/exercise program aimed toward attainment of established weight goal;Weight Loss: Understanding of general recommendations for a balanced deficit meal plan, which promotes 1-2 lb weight loss per week and includes a negative energy balance of [phone] kcal/d;Understanding recommendations for meals to include 15-35% energy as protein, 25-35% energy from fat, 35-60% energy from carbohydrates, less than  200mg  of dietary cholesterol, 20-35 gm of total fiber daily;Understanding of distribution of calorie intake throughout the day with the consumption of 4-5 meals/snacks    Diabetes Yes    Intervention Provide education about signs/symptoms and action to take for hypo/hyperglycemia.;Provide education about proper nutrition, including hydration, and aerobic/resistive exercise prescription along with prescribed medications to achieve blood glucose in normal ranges: Fasting glucose 65-99 mg/dL    Expected Outcomes Short Term: Participant verbalizes understanding of the signs/symptoms and immediate care of hyper/hypoglycemia, proper foot care and importance of medication, aerobic/resistive exercise and nutrition plan for blood glucose control.;Long Term: Attainment of HbA1C < 7%.    Hypertension Yes    Intervention Provide education on lifestyle modifcations including regular physical activity/exercise, weight management, moderate sodium restriction and increased consumption of fresh fruit, vegetables, and low fat dairy, alcohol moderation, and smoking cessation.;Monitor prescription use compliance.    Expected Outcomes Short Term: Continued assessment and intervention until BP is < 140/34mm HG in hypertensive participants. < 130/84mm HG in hypertensive participants with diabetes, heart failure or chronic kidney disease.;Long Term: Maintenance of blood pressure at goal levels.    Lipids Yes    Intervention Provide education and support for participant on nutrition & aerobic/resistive exercise along with prescribed medications to achieve LDL 70mg , HDL >40mg .    Expected Outcomes Short Term: Participant states understanding of desired cholesterol values and is compliant with medications prescribed. Participant is following exercise prescription and nutrition guidelines.;Long Term: Cholesterol controlled with medications as prescribed, with individualized exercise RX and with personalized nutrition plan. Value  goals: LDL < 70mg , HDL > 40 mg.             Tobacco Use Initial Evaluation: Social History   Tobacco Use  Smoking Status Former   Packs/day: 0.50   Years: 10.00   Pack years: 5.00   Types: Cigarettes   Quit date: 04/08/2015   Years since quitting: 5.3  Smokeless Tobacco Never    Exercise Goals and Review:  Exercise Goals     Row Name 08/19/20 0939             Exercise Goals   Increase Physical Activity Yes       Intervention Provide advice, education, support and counseling about physical activity/exercise needs.;Develop an individualized exercise prescription for aerobic and resistive training based on initial evaluation findings, risk stratification, comorbidities and participant's personal goals.       Expected Outcomes Short Term: Attend rehab on a regular basis to increase amount of physical activity.;Long Term: Add in home exercise to make exercise part of  routine and to increase amount of physical activity.;Long Term: Exercising regularly at least 3-5 days a week.       Increase Strength and Stamina Yes       Intervention Provide advice, education, support and counseling about physical activity/exercise needs.;Develop an individualized exercise prescription for aerobic and resistive training based on initial evaluation findings, risk stratification, comorbidities and participant's personal goals.       Expected Outcomes Short Term: Increase workloads from initial exercise prescription for resistance, speed, and METs.;Long Term: Improve cardiorespiratory fitness, muscular endurance and strength as measured by increased METs and functional capacity (6MWT);Short Term: Perform resistance training exercises routinely during rehab and add in resistance training at home       Able to understand and use rate of perceived exertion (RPE) scale Yes       Intervention Provide education and explanation on how to use RPE scale       Expected Outcomes Short Term: Able to use RPE daily  in rehab to express subjective intensity level;Long Term:  Able to use RPE to guide intensity level when exercising independently       Able to understand and use Dyspnea scale Yes       Intervention Provide education and explanation on how to use Dyspnea scale       Expected Outcomes Short Term: Able to use Dyspnea scale daily in rehab to express subjective sense of shortness of breath during exertion;Long Term: Able to use Dyspnea scale to guide intensity level when exercising independently       Knowledge and understanding of Target Heart Rate Range (THRR) Yes       Intervention Provide education and explanation of THRR including how the numbers were predicted and where they are located for reference       Expected Outcomes Short Term: Able to use daily as guideline for intensity in rehab;Short Term: Able to state/look up THRR;Long Term: Able to use THRR to govern intensity when exercising independently       Able to check pulse independently Yes       Intervention Provide education and demonstration on how to check pulse in carotid and radial arteries.;Review the importance of being able to check your own pulse for safety during independent exercise       Expected Outcomes Short Term: Able to explain why pulse checking is important during independent exercise;Long Term: Able to check pulse independently and accurately       Understanding of Exercise Prescription Yes       Intervention Provide education, explanation, and written materials on patient's individual exercise prescription       Expected Outcomes Short Term: Able to explain program exercise prescription;Long Term: Able to explain home exercise prescription to exercise independently                Copy of goals given to participant.

## 2020-08-20 ENCOUNTER — Other Ambulatory Visit: Payer: Self-pay | Admitting: Obstetrics and Gynecology

## 2020-08-20 ENCOUNTER — Ambulatory Visit: Payer: No Typology Code available for payment source

## 2020-08-20 ENCOUNTER — Encounter
Admission: RE | Admit: 2020-08-20 | Discharge: 2020-08-20 | Disposition: A | Discharge status: Home / Self Care | Payer: No Typology Code available for payment source | Source: Ambulatory Visit | Attending: Physician Assistant | Admitting: Physician Assistant

## 2020-08-20 DIAGNOSIS — R079 Chest pain, unspecified: Secondary | ICD-10-CM | POA: Diagnosis present

## 2020-08-20 LAB — NM MYOCAR MULTI W/SPECT W/WALL MOTION / EF
Estimated workload: 1 METS
Exercise duration (min): 0 min
Exercise duration (sec): 0 s
LV dias vol: 164 mL (ref 62–150)
LV sys vol: 84 mL
MPHR: 164 {beats}/min
Peak HR: 70 {beats}/min
Percent HR: 42 %
Rest HR: 61 {beats}/min
SDS: 1
SRS: 8
SSS: 6
TID: 1.03

## 2020-08-20 MED ORDER — TECHNETIUM TC 99M TETROFOSMIN IV KIT
30.0000 | PACK | Freq: Once | INTRAVENOUS | Status: AC | PRN
  Administered 2020-08-20: 32.1 via INTRAVENOUS

## 2020-08-20 MED ORDER — TECHNETIUM TC 99M TETROFOSMIN IV KIT
10.0000 | PACK | Freq: Once | INTRAVENOUS | Status: AC | PRN
  Administered 2020-08-20: 10.7 via INTRAVENOUS

## 2020-08-20 MED ORDER — REGADENOSON 0.4 MG/5ML IV SOLN
0.4000 mg | Freq: Once | INTRAVENOUS | Status: AC
  Administered 2020-08-20: 0.4 mg via INTRAVENOUS
  Filled 2020-08-20: qty 5

## 2020-08-22 ENCOUNTER — Ambulatory Visit: Payer: No Typology Code available for payment source

## 2020-08-23 ENCOUNTER — Telehealth: Payer: Self-pay | Admitting: Physician Assistant

## 2020-08-23 NOTE — Telephone Encounter (Signed)
Stress test results reviewed with patient.  We reviewed that overall, this was ruled in intermediate risk study.  Patient was informed that findings were consistent with prior MI.  There was one partially reversible area that will be discussed with his interventional team and pt updated if any additional recommendations s/p recent 07/28/2020 catheterization.    We discussed that the moderately decreased EF of 38% is an estimate, and that echo is a more accurate estimate of his pump function. Once on optimal medical therapy, we will repeat an echo to reassess EF.  He is starting Entresto and updated me that his pressures at home have been SBP 130s and DBP 80s.  He plans to start Entresto 49-51 twice daily and will let me know over the weekend if this is not well-tolerated.    We did discuss that ideal BP < 130/80 and SBP in the 100s is acceptable as long as he is not dizzy with lower pressures (he was concerned about lower pressures once starting Entresto).  He expressed his understanding. He will plan to start Entresto on 08/24/20.

## 2020-08-25 ENCOUNTER — Encounter: Payer: No Typology Code available for payment source | Admitting: *Deleted

## 2020-08-25 ENCOUNTER — Telehealth: Payer: Self-pay

## 2020-08-25 ENCOUNTER — Encounter: Payer: Self-pay | Admitting: *Deleted

## 2020-08-25 ENCOUNTER — Ambulatory Visit: Payer: No Typology Code available for payment source

## 2020-08-25 DIAGNOSIS — Z5189 Encounter for other specified aftercare: Secondary | ICD-10-CM | POA: Diagnosis not present

## 2020-08-25 DIAGNOSIS — I214 Non-ST elevation (NSTEMI) myocardial infarction: Secondary | ICD-10-CM

## 2020-08-25 LAB — GLUCOSE, CAPILLARY
Glucose-Capillary: 213 mg/dL — ABNORMAL HIGH (ref 70–99)
Glucose-Capillary: 122 mg/dL — ABNORMAL HIGH (ref 70–99)

## 2020-08-25 MED ORDER — DEXCOM G6 RECEIVER DEVI: 0 refills | Status: DC

## 2020-08-25 MED ORDER — DEXCOM G6 TRANSMITTER MISC: 0 refills | Status: DC

## 2020-08-25 MED ORDER — DEXCOM G6 SENSOR MISC: 2 refills | Status: DC

## 2020-08-25 NOTE — Progress Notes (Signed)
Discharged wrong person.  Will reinstate all visits

## 2020-08-25 NOTE — Telephone Encounter (Signed)
Sent in all 3 things

## 2020-08-25 NOTE — Progress Notes (Deleted)
Cardiac Individual Treatment Plan  Patient Details  Name: Todd Kelley MRN: [id number] Date of Birth: August 16, 1965 Referring Provider:   Flowsheet Row Cardiac Rehab from 08/19/2020 in [facility] Cardiac and Pulmonary Rehab  Referring Provider Kathlyn Sacramento MD  [facility] Cardiologist: Dr. Ida Rogue       Initial Encounter Date:  Flowsheet Row Cardiac Rehab from 08/19/2020 in [facility] Cardiac and Pulmonary Rehab  Date 08/19/20       Visit Diagnosis: NSTEMI (non-ST elevated myocardial infarction) [facility])  Patient's Home Medications on Admission:  Current Outpatient Medications:    aspirin (ASPIRIN LOW DOSE) 81 MG EC tablet, Take 1 tablet (81 mg total) by mouth daily. Swallow whole., Disp: 90 tablet, Rfl: 3   atorvastatin (LIPITOR) 10 MG tablet, Take 1 tablet (10 mg total) by mouth daily., Disp: 30 tablet, Rfl: 1   cetirizine (ZYRTEC) 10 MG tablet, Take 10 mg by mouth daily., Disp: , Rfl:    clopidogrel (PLAVIX) 75 MG tablet, Take 1 tablet (75 mg total) by mouth daily with breakfast., Disp: 30 tablet, Rfl: 1   Continuous Blood Gluc Sensor (FREESTYLE LIBRE 14 DAY SENSOR) MISC, 1 each by Does not apply route every 21 ( twenty-one) days., Disp: 2 each, Rfl: 11   empagliflozin (JARDIANCE) 10 MG TABS tablet, Take 1 tablet (10 mg total) by mouth daily before breakfast., Disp: 30 tablet, Rfl: 2   escitalopram (LEXAPRO) 10 MG tablet, Take 1 tablet (10 mg total) by mouth daily., Disp: 90 tablet, Rfl: 3   ezetimibe (ZETIA) 10 MG tablet, Take 1 tablet (10 mg total) by mouth daily., Disp: 30 tablet, Rfl: 1   fenofibrate (TRICOR) 145 MG tablet, TAKE 1 TABLET BY MOUTH  DAILY, Disp: 90 tablet, Rfl: 0   levothyroxine (SYNTHROID) 150 MCG tablet, Take 1 tablet (150 mcg total) by mouth daily before breakfast., Disp: 90 tablet, Rfl: 1   metFORMIN (GLUCOPHAGE XR) 500 MG 24 hr tablet, Take 1 tablet (500 mg total) by mouth daily with breakfast., Disp: 30 tablet, Rfl: 11   Multiple Vitamins-Minerals (MULTIVITAMIN ADULT)  CHEW, Chew 1 tablet by mouth daily., Disp: , Rfl:    nitroGLYCERIN (NITROSTAT) 0.4 MG SL tablet, Place 1 tablet (0.4 mg total) under the tongue every 5 (five) minutes as needed for chest pain., Disp: 25 tablet, Rfl: 2   PROAIR RESPICLICK 026 (90 Base) MCG/ACT AEPB, USE 2 INHALATIONS BY MOUTH  EVERY 6 HOURS AS NEEDED, Disp: 1 each, Rfl: 0   sacubitril-valsartan (ENTRESTO) 49-51 MG, Take 1 tablet by mouth 2 (two) times daily., Disp: 60 tablet, Rfl: 0   tadalafil (CIALIS) 5 MG tablet, Take 1 tablet (5 mg total) by mouth daily., Disp: 90 tablet, Rfl: 3   torsemide (DEMADEX) 10 MG tablet, Take 1 tablet (10 mg total) by mouth daily., Disp: 30 tablet, Rfl: 5  Past Medical History: Past Medical History:  Diagnosis Date   Acid reflux    Allergic asthma    rare flare up, worse with alllergies as of 12/2016   Coronary artery disease    a. 08/1998 Lateral MI/PCI/BMS [place]; b. 2001 PTCA x 2 2/2 ISR in [place]; c. 2002 PCI/brachyrx 2/2 ISR in [place]; d. 01/2001 CABG x 4: LIMA->LAD, RIMA->RI, VG->RPL->RPDA; e. 01/2014 MV: Low risk w/ attenuation artifact and mild inf basal isch. Nl EF->Med rx.   DVT (deep venous thrombosis) ([place])    a. 01/2001 after CABG.   Erectile dysfunction    Hyperlipidemia LDL goal <70    Hypertension    Hypothyroidism  Palpitations    Pericardial effusion    a. 01/2001 Effusion w/ tamponade req window following CABG.   Tobacco use    vape, no cigarrette since 04/2015   Type II diabetes mellitus ([place])     Tobacco Use: Social History   Tobacco Use  Smoking Status Former   Packs/day: 0.50   Years: 10.00   Pack years: 5.00   Types: Cigarettes   Quit date: 04/08/2015   Years since quitting: 5.3  Smokeless Tobacco Never    Labs: Recent Review Flowsheet Data     Labs for ITP Cardiac and Pulmonary Rehab Latest Ref Rng & Units 03/09/2019 12/27/2019 03/14/2020 07/25/2020 07/26/2020   Cholestrol 0 - 200 mg/dL - 137 125 - 207(H)   LDLCALC 0 - 99 mg/dL - 80 70 - 138(H)   HDL >40 mg/dL -  37(L) 39(L) - 41   Trlycerides <150 mg/dL - 105 83 - 141   Hemoglobin A1c 4.8 - 5.6 % 7.1(H) 7.2(A) 7.2(H) 7.4(H) -        Exercise Target Goals: Exercise Program Goal: Individual exercise prescription set using results from initial 6 min walk test and THRR while considering  patient's activity barriers and safety.   Exercise Prescription Goal: Initial exercise prescription builds to 30-45 minutes a day of aerobic activity, 2-3 days per week.  Home exercise guidelines will be given to patient during program as part of exercise prescription that the participant will acknowledge.   Education: Aerobic Exercise: - Group verbal and visual presentation on the components of exercise prescription. Introduces F.I.T.T principle from ACSM for exercise prescriptions.  Reviews F.I.T.T. principles of aerobic exercise including progression. Written material given at graduation.   Education: Resistance Exercise: - Group verbal and visual presentation on the components of exercise prescription. Introduces F.I.T.T principle from ACSM for exercise prescriptions  Reviews F.I.T.T. principles of resistance exercise including progression. Written material given at graduation.    Education: Exercise & Equipment Safety: - Individual verbal instruction and demonstration of equipment use and safety with use of the equipment. Flowsheet Row Cardiac Rehab from 08/19/2020 in [facility] Cardiac and Pulmonary Rehab  Date 08/19/20  Educator [facility]  Instruction Review Code 1- Verbalizes Understanding       Education: Exercise Physiology & General Exercise Guidelines: - Group verbal and written instruction with models to review the exercise physiology of the cardiovascular system and associated critical values. Provides general exercise guidelines with specific guidelines to those with heart or lung disease.    Education: Flexibility, Balance, Mind/Body Relaxation: - Group verbal and visual presentation with interactive  activity on the components of exercise prescription. Introduces F.I.T.T principle from ACSM for exercise prescriptions. Reviews F.I.T.T. principles of flexibility and balance exercise training including progression. Also discusses the mind body connection.  Reviews various relaxation techniques to help reduce and manage stress (i.e. Deep breathing, progressive muscle relaxation, and visualization). Balance handout provided to take home. Written material given at graduation.   Activity Barriers & Risk Stratification:  Activity Barriers & Cardiac Risk Stratification - 08/19/20 0934       Activity Barriers & Cardiac Risk Stratification   Activity Barriers Chest Pain/Angina;Muscular Weakness;Shortness of Breath;Deconditioning;Joint Problems   tweaked hip earlier this year, occasional pain   Cardiac Risk Stratification High             6 Minute Walk:  6 Minute Walk     Row Name 08/19/20 0932         6 Minute Walk   Phase Initial  Distance 1470 feet     Walk Time 6 minutes     # of Rest Breaks 0     MPH 2.78     METS 3.61     RPE 8     VO2 Peak 12.62     Symptoms No     Resting HR 64 bpm     Resting BP 126/66     Resting Oxygen Saturation  98 %     Exercise Oxygen Saturation  during 6 min walk 96 %     Max Ex. HR 79 bpm     Max Ex. BP 136/70              Oxygen Initial Assessment:   Oxygen Re-Evaluation:   Oxygen Discharge (Final Oxygen Re-Evaluation):   Initial Exercise Prescription:  Initial Exercise Prescription - 08/19/20 0900       Date of Initial Exercise RX and Referring Provider   Date 08/19/20    Referring Provider Kathlyn Sacramento MD   Primary Cardiologist: Dr. Ida Rogue     Treadmill   MPH 2.7    Grade 1    Minutes 15    METs 3.44      Recumbant Bike   Level 4    RPM 50    Watts 50    Minutes 15    METs 3.5      Arm Ergometer   Level 2    RPM 25    Minutes 15    METs 3      Recumbant Elliptical   Level 2    RPM 50     Minutes 15    METs 3      REL-XR   Level 3    Speed 50    Minutes 15    METs 3      Biostep-RELP   Level 4    SPM 50    Minutes 15    METs 3      Track   Laps 40    Minutes 15    METs 3.2      Prescription Details   Frequency (times per week) 3    Duration Progress to 30 minutes of continuous aerobic without signs/symptoms of physical distress      Intensity   THRR 40-80% of Max Heartrate 104-144    Ratings of Perceived Exertion 11-13    Perceived Dyspnea 0-4      Progression   Progression Continue to progress workloads to maintain intensity without signs/symptoms of physical distress.      Resistance Training   Training Prescription Yes    Weight 4 lb    Reps 10-15             Perform Capillary Blood Glucose checks as needed.  Exercise Prescription Changes:   Exercise Prescription Changes     Row Name 08/19/20 0900             Response to Exercise   Blood Pressure (Admit) 126/66       Blood Pressure (Exercise) 136/70       Blood Pressure (Exit) 120/62       Heart Rate (Admit) 64 bpm       Heart Rate (Exercise) 79 bpm       Heart Rate (Exit) 66 bpm       Oxygen Saturation (Admit) 98 %       Oxygen Saturation (Exercise) 96 %       Rating of Perceived  Exertion (Exercise) 8       Symptoms none       Comments walk test results                Exercise Comments:   Exercise Comments     Row Name 08/25/20 0739           Exercise Comments DIscharged Hasan called to state he has a new job and will not be able to attend the program                Exercise Goals and Review:   Exercise Goals     Row Name 08/19/20 0939             Exercise Goals   Increase Physical Activity Yes       Intervention Provide advice, education, support and counseling about physical activity/exercise needs.;Develop an individualized exercise prescription for aerobic and resistive training based on initial evaluation findings, risk stratification,  comorbidities and participant's personal goals.       Expected Outcomes Short Term: Attend rehab on a regular basis to increase amount of physical activity.;Long Term: Add in home exercise to make exercise part of routine and to increase amount of physical activity.;Long Term: Exercising regularly at least 3-5 days a week.       Increase Strength and Stamina Yes       Intervention Provide advice, education, support and counseling about physical activity/exercise needs.;Develop an individualized exercise prescription for aerobic and resistive training based on initial evaluation findings, risk stratification, comorbidities and participant's personal goals.       Expected Outcomes Short Term: Increase workloads from initial exercise prescription for resistance, speed, and METs.;Long Term: Improve cardiorespiratory fitness, muscular endurance and strength as measured by increased METs and functional capacity (6MWT);Short Term: Perform resistance training exercises routinely during rehab and add in resistance training at home       Able to understand and use rate of perceived exertion (RPE) scale Yes       Intervention Provide education and explanation on how to use RPE scale       Expected Outcomes Short Term: Able to use RPE daily in rehab to express subjective intensity level;Long Term:  Able to use RPE to guide intensity level when exercising independently       Able to understand and use Dyspnea scale Yes       Intervention Provide education and explanation on how to use Dyspnea scale       Expected Outcomes Short Term: Able to use Dyspnea scale daily in rehab to express subjective sense of shortness of breath during exertion;Long Term: Able to use Dyspnea scale to guide intensity level when exercising independently       Knowledge and understanding of Target Heart Rate Range (THRR) Yes       Intervention Provide education and explanation of THRR including how the numbers were predicted and where they  are located for reference       Expected Outcomes Short Term: Able to use daily as guideline for intensity in rehab;Short Term: Able to state/look up THRR;Long Term: Able to use THRR to govern intensity when exercising independently       Able to check pulse independently Yes       Intervention Provide education and demonstration on how to check pulse in carotid and radial arteries.;Review the importance of being able to check your own pulse for safety during independent exercise       Expected Outcomes  Short Term: Able to explain why pulse checking is important during independent exercise;Long Term: Able to check pulse independently and accurately       Understanding of Exercise Prescription Yes       Intervention Provide education, explanation, and written materials on patient's individual exercise prescription       Expected Outcomes Short Term: Able to explain program exercise prescription;Long Term: Able to explain home exercise prescription to exercise independently                Exercise Goals Re-Evaluation :   Discharge Exercise Prescription (Final Exercise Prescription Changes):  Exercise Prescription Changes - 08/19/20 0900       Response to Exercise   Blood Pressure (Admit) 126/66    Blood Pressure (Exercise) 136/70    Blood Pressure (Exit) 120/62    Heart Rate (Admit) 64 bpm    Heart Rate (Exercise) 79 bpm    Heart Rate (Exit) 66 bpm    Oxygen Saturation (Admit) 98 %    Oxygen Saturation (Exercise) 96 %    Rating of Perceived Exertion (Exercise) 8    Symptoms none    Comments walk test results             Nutrition:  Target Goals: Understanding of nutrition guidelines, daily intake of sodium 1500mg , cholesterol 200mg , calories 30% from fat and 7% or less from saturated fats, daily to have 5 or more servings of fruits and vegetables.  Education: All About Nutrition: -Group instruction provided by verbal, written material, interactive activities, discussions,  models, and posters to present general guidelines for heart healthy nutrition including fat, fiber, MyPlate, the role of sodium in heart healthy nutrition, utilization of the nutrition label, and utilization of this knowledge for meal planning. Follow up email sent as well. Written material given at graduation. Flowsheet Row Cardiac Rehab from 08/19/2020 in [facility] Cardiac and Pulmonary Rehab  Education need identified 08/19/20       Biometrics:  Pre Biometrics - 08/19/20 0940       Pre Biometrics   Height 5' 10.2" (1.783 m)    Weight 218 lb (98.9 kg)    BMI (Calculated) 31.1    Single Leg Stand 30 seconds              Nutrition Therapy Plan and Nutrition Goals:  Nutrition Therapy & Goals - 08/19/20 0941       Intervention Plan   Intervention Prescribe, educate and counsel regarding individualized specific dietary modifications aiming towards targeted core components such as weight, hypertension, lipid management, diabetes, heart failure and other comorbidities.    Expected Outcomes Short Term Goal: Understand basic principles of dietary content, such as calories, fat, sodium, cholesterol and nutrients.;Short Term Goal: A plan has been developed with personal nutrition goals set during dietitian appointment.;Long Term Goal: Adherence to prescribed nutrition plan.             Nutrition Assessments:  MEDIFICTS Score Key: ?70 Need to make dietary changes  40-70 Heart Healthy Diet ? 40 Therapeutic Level Cholesterol Diet  Flowsheet Row Cardiac Rehab from 08/19/2020 in [facility] Cardiac and Pulmonary Rehab  Picture Your Plate Total Score on Admission 67      Picture Your Plate Scores: <23 Unhealthy dietary pattern with much room for improvement. 41-50 Dietary pattern unlikely to meet recommendations for good health and room for improvement. 51-60 More healthful dietary pattern, with some room for improvement.  >60 Healthy dietary pattern, although there may be some specific  behaviors  that could be improved.    Nutrition Goals Re-Evaluation:   Nutrition Goals Discharge (Final Nutrition Goals Re-Evaluation):   Psychosocial: Target Goals: Acknowledge presence or absence of significant depression and/or stress, maximize coping skills, provide positive support system. Participant is able to verbalize types and ability to use techniques and skills needed for reducing stress and depression.   Education: Stress, Anxiety, and Depression - Group verbal and visual presentation to define topics covered.  Reviews how body is impacted by stress, anxiety, and depression.  Also discusses healthy ways to reduce stress and to treat/manage anxiety and depression.  Written material given at graduation.   Education: Sleep Hygiene -Provides group verbal and written instruction about how sleep can affect your health.  Define sleep hygiene, discuss sleep cycles and impact of sleep habits. Review good sleep hygiene tips.    Initial Review & Psychosocial Screening:  Initial Psych Review & Screening - 08/11/20 1536       Initial Review   Current issues with Current Stress Concerns      Family Dynamics   Good Support System? Yes   wife, dad, son     Barriers   Psychosocial barriers to participate in program There are no identifiable barriers or psychosocial needs.;The patient should benefit from training in stress management and relaxation.      Screening Interventions   Interventions Encouraged to exercise;To provide support and resources with identified psychosocial needs;Provide feedback about the scores to participant    Expected Outcomes Short Term goal: Utilizing psychosocial counselor, staff and physician to assist with identification of specific Stressors or current issues interfering with healing process. Setting desired goal for each stressor or current issue identified.;Long Term Goal: Stressors or current issues are controlled or eliminated.;Short Term goal:  Identification and review with participant of any Quality of Life or Depression concerns found by scoring the questionnaire.;Long Term goal: The participant improves quality of Life and PHQ9 Scores as seen by post scores and/or verbalization of changes             Quality of Life Scores:   Quality of Life - 08/19/20 0941       Quality of Life   Select Quality of Life      Quality of Life Scores   Health/Function Pre 23.87 %    Socioeconomic Pre 26 %    Psych/Spiritual Pre 26.64 %    Family Pre 27.6 %    GLOBAL Pre 25.44 %            Scores of 19 and below usually indicate a poorer quality of life in these areas.  A difference of  2-3 points is a clinically meaningful difference.  A difference of 2-3 points in the total score of the Quality of Life Index has been associated with significant improvement in overall quality of life, self-image, physical symptoms, and general health in studies assessing change in quality of life.  PHQ-9: Recent Review Flowsheet Data     Depression screen [facility] 2/9 08/19/2020 03/14/2020 03/09/2019 01/16/2018 01/14/2017   Decreased Interest 1 0 0 0 0   Down, Depressed, Hopeless 0 0 0 0 0   PHQ - 2 Score 1 0 0 0 0   Altered sleeping 0 - - - -   Tired, decreased energy 1 - - - -   Change in appetite 0 - - - -   Feeling bad or failure about yourself  0 - - - -   Trouble concentrating 0 - - - -  Moving slowly or fidgety/restless 0 - - - -   Suicidal thoughts 0 - - - -   PHQ-9 Score 2 - - - -   Difficult doing work/chores Not difficult at all - - - -      Interpretation of Total Score  Total Score Depression Severity:  1-4 = Minimal depression, 5-9 = Mild depression, 10-14 = Moderate depression, 15-19 = Moderately severe depression, 20-27 = Severe depression   Psychosocial Evaluation and Intervention:  Psychosocial Evaluation - 08/11/20 1551       Psychosocial Evaluation & Interventions   Interventions Encouraged to exercise with the program  and follow exercise prescription;Stress management education;Relaxation education    Comments Jahsir reports doing okay after his NSTEMI. He is still struggling with feeling tired and low stamina, but is trying to slowly increase his activity level. 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 He does have twin 12 year olds at home which keep him quite active. He states his wife, older son, and dad are his main support system. He takes Lexapro at night to help him sleep because his mind races sometimes. At his appointment today, his cardiologist placed a holter monitor to evaluate for heart abnormalities since he has been experiencing palpitations and tiredness. He is very motivated to care for his body and is looking forward to coming to cardiac rehab.    Expected Outcomes Short: attend cardiac rehab for education and exercise. Long: develop and maintain positive self care habits.    Continue Psychosocial Services  Follow up required by staff             Psychosocial Re-Evaluation:   Psychosocial Discharge (Final Psychosocial Re-Evaluation):   Vocational Rehabilitation: Provide vocational rehab assistance to qualifying candidates.   Vocational Rehab Evaluation & Intervention:  Vocational Rehab - 08/11/20 1537       Initial Vocational Rehab Evaluation & Intervention   Assessment shows need for Vocational Rehabilitation No             Education: Education Goals: Education classes will be provided on a variety of topics geared toward better understanding of heart health and risk factor modification. Participant will state understanding/return demonstration of topics presented as noted by education test scores.  Learning Barriers/Preferences:  Learning Barriers/Preferences - 08/11/20 1537       Learning Barriers/Preferences   Learning Barriers None    Learning Preferences None             General Cardiac Education  Topics:  AED/CPR: - Group verbal and written instruction with the use of models to demonstrate the basic use of the AED with the basic ABC's of resuscitation.   Anatomy and Cardiac Procedures: - Group verbal and visual presentation and models provide information about basic cardiac anatomy and function. Reviews the testing methods done to diagnose heart disease and the outcomes of the test results. Describes the treatment choices: Medical Management, Angioplasty, or Coronary Bypass Surgery for treating various heart conditions including Myocardial Infarction, Angina, Valve Disease, and Cardiac Arrhythmias.  Written material given at graduation.   Medication Safety: - Group verbal and visual instruction to review commonly prescribed medications for heart and lung disease. Reviews the medication, class of the drug, and side effects. Includes the steps to properly store meds and maintain the prescription regimen.  Written material given at graduation.   Intimacy: - Group verbal instruction through game format to discuss how heart and lung disease  can affect sexual intimacy. Written material given at graduation..   Know Your Numbers and Heart Failure: - Group verbal and visual instruction to discuss disease risk factors for cardiac and pulmonary disease and treatment options.  Reviews associated critical values for Overweight/Obesity, Hypertension, Cholesterol, and Diabetes.  Discusses basics of heart failure: signs/symptoms and treatments.  Introduces Heart Failure Zone chart for action plan for heart failure.  Written material given at graduation. Flowsheet Row Cardiac Rehab from 08/19/2020 in [facility] Cardiac and Pulmonary Rehab  Education need identified 08/19/20       Infection Prevention: - Provides verbal and written material to individual with discussion of infection control including proper hand washing and proper equipment cleaning during exercise session. Flowsheet Row Cardiac Rehab  from 08/19/2020 in [facility] Cardiac and Pulmonary Rehab  Date 08/19/20  Educator [facility]  Instruction Review Code 1- Verbalizes Understanding       Falls Prevention: - Provides verbal and written material to individual with discussion of falls prevention and safety. Flowsheet Row Cardiac Rehab from 08/19/2020 in [facility] Cardiac and Pulmonary Rehab  Date 08/19/20  Educator [facility]  Instruction Review Code 1- Verbalizes Understanding       Other: -Provides group and verbal instruction on various topics (see comments)   Knowledge Questionnaire Score:  Knowledge Questionnaire Score - 08/19/20 0942       Knowledge Questionnaire Score   Pre Score 24/26 Education Focus: Nutrition, Risk factors             Core Components/Risk Factors/Patient Goals at Admission:  Personal Goals and Risk Factors at Admission - 08/19/20 0942       Core Components/Risk Factors/Patient Goals on Admission    Weight Management Yes;Obesity;Weight Loss    Intervention Weight Management: Develop a combined nutrition and exercise program designed to reach desired caloric intake, while maintaining appropriate intake of nutrient and fiber, sodium and fats, and appropriate energy expenditure required for the weight goal.;Weight Management: Provide education and appropriate resources to help participant work on and attain dietary goals.;Weight Management/Obesity: Establish reasonable short term and long term weight goals.;Obesity: Provide education and appropriate resources to help participant work on and attain dietary goals.    Admit Weight 218 lb (98.9 kg)    Goal Weight: Short Term 210 lb (95.3 kg)    Goal Weight: Long Term 200 lb (90.7 kg)    Expected Outcomes Short Term: Continue to assess and modify interventions until short term weight is achieved;Long Term: Adherence to nutrition and physical activity/exercise program aimed toward attainment of established weight goal;Weight Loss: Understanding of general  recommendations for a balanced deficit meal plan, which promotes 1-2 lb weight loss per week and includes a negative energy balance of [phone] kcal/d;Understanding recommendations for meals to include 15-35% energy as protein, 25-35% energy from fat, 35-60% energy from carbohydrates, less than 200mg  of dietary cholesterol, 20-35 gm of total fiber daily;Understanding of distribution of calorie intake throughout the day with the consumption of 4-5 meals/snacks    Diabetes Yes    Intervention Provide education about signs/symptoms and action to take for hypo/hyperglycemia.;Provide education about proper nutrition, including hydration, and aerobic/resistive exercise prescription along with prescribed medications to achieve blood glucose in normal ranges: Fasting glucose 65-99 mg/dL    Expected Outcomes Short Term: Participant verbalizes understanding of the signs/symptoms and immediate care of hyper/hypoglycemia, proper foot care and importance of medication, aerobic/resistive exercise and nutrition plan for blood glucose control.;Long Term: Attainment of HbA1C < 7%.    Hypertension Yes  Intervention Provide education on lifestyle modifcations including regular physical activity/exercise, weight management, moderate sodium restriction and increased consumption of fresh fruit, vegetables, and low fat dairy, alcohol moderation, and smoking cessation.;Monitor prescription use compliance.    Expected Outcomes Short Term: Continued assessment and intervention until BP is < 140/62mm HG in hypertensive participants. < 130/3mm HG in hypertensive participants with diabetes, heart failure or chronic kidney disease.;Long Term: Maintenance of blood pressure at goal levels.    Lipids Yes    Intervention Provide education and support for participant on nutrition & aerobic/resistive exercise along with prescribed medications to achieve LDL 70mg , HDL >40mg .    Expected Outcomes Short Term: Participant states understanding  of desired cholesterol values and is compliant with medications prescribed. Participant is following exercise prescription and nutrition guidelines.;Long Term: Cholesterol controlled with medications as prescribed, with individualized exercise RX and with personalized nutrition plan. Value goals: LDL < 70mg , HDL > 40 mg.             Education:Diabetes - Individual verbal and written instruction to review signs/symptoms of diabetes, desired ranges of glucose level fasting, after meals and with exercise. Acknowledge that pre and post exercise glucose checks will be done for 3 sessions at entry of program. [place] from 08/19/2020 in [facility]  Date 08/11/20  Educator [facility]  Instruction Review Code 1- Verbalizes Understanding       Core Components/Risk Factors/Patient Goals Review:    Core Components/Risk Factors/Patient Goals at Discharge (Final Review):    ITP Comments:  ITP Comments     Row Name 08/11/20 1554 08/19/20 0931 08/25/20 0738       ITP Comments Initial telephone orientation completed. Diagnosis can be found in [facility] 5/27. EP orientation scheduled for Tuesday 6/21 at 8am. Completed 6MWT and gym orientation. Initial ITP created and sent for review to Dr. Emily Filbert, Medical Director. DIscharged  Shaman called to state he has a new job and will not be able to attend the program              Comments: DIscharged

## 2020-08-25 NOTE — Progress Notes (Signed)
Daily Session Note  Patient Details  Name: Todd Kelley MRN: [id number] Date of Birth: 28-Mar-1965 Referring Provider:   Flowsheet Row Cardiac Rehab from 08/19/2020 in [facility] Cardiac and Pulmonary Rehab  Referring Provider Kathlyn [place] MD  [facility] Cardiologist: Dr. Ida Rogue       Encounter Date: 08/25/2020  Check In:  Session Check In - 08/25/20 0931       Check-In   Supervising physician immediately available to respond to emergencies See telemetry face sheet for immediately available ER MD    Location [facility]-Cardiac & Pulmonary Rehab    Staff Present Heath Lark, RN, BSN, CCRP;Joseph Hood RCP,RRT,BSRT;Kelly [place], [place], ACSM CEP, Exercise Physiologist    Virtual Visit No    Medication changes reported     No    Fall or balance concerns reported    No    Warm-up and Cool-down Performed on first and last piece of equipment    Resistance Training Performed Yes    VAD Patient? No    PAD/SET Patient? No      Pain Assessment   Currently in Pain? No/denies                Social History   Tobacco Use  Smoking Status Former   Packs/day: 0.50   Years: 10.00   Pack years: 5.00   Types: Cigarettes   Quit date: 04/08/2015   Years since quitting: 5.3  Smokeless Tobacco Never    Goals Met:  Exercise tolerated well Personal goals reviewed No report of cardiac concerns or symptoms  Goals Unmet:  Not Applicable  Comments: First full day of exercise!  Patient was oriented to gym and equipment including functions, settings, policies, and procedures.  Patient's individual exercise prescription and treatment plan were reviewed.  All starting workloads were established based on the results of the 6 minute walk test done at initial orientation visit.  The plan for exercise progression was also introduced and progression will be customized based on patient's performance and goals.    Dr. Emily Filbert is Medical Director for [facility].  Dr. Ottie Glazier is Medical Director for [facility].

## 2020-08-25 NOTE — Telephone Encounter (Signed)
Pt. Called stating he needs his freestyle libre switched to dexcom.

## 2020-08-26 ENCOUNTER — Telehealth: Payer: Self-pay | Admitting: Cardiovascular Disease

## 2020-08-26 NOTE — Telephone Encounter (Signed)
Pt c/o medication issue:  1. Name of Medication: Entresto  2. How are you currently taking this medication (dosage and times per day)? 49-51 MG 1 tablets 2 daily   3. Are you having a reaction (difficulty breathing--STAT)? BP low 99/63 - some dizziness  4. What is your medication issue? Patient may need to change dosage. Please call to discuss.

## 2020-08-26 NOTE — Telephone Encounter (Signed)
See also phone note 08/18/20.   Pt stopped Irbesartan and confirmed waiting 3 days then started Entresto 49-51mg  on Sunday 6/26.  Did note SBP at rehab yesterday 117, then SBP dropped to 105 after exercise.  Then this morning after AM dose, felt light-headed that continued through morning.  1145 BP 99/63. Pt ate pretzels and rechecked BP stating SBP 117 and "light headedness has resolved."  Advised pt to hold PM dose of Entresto d/t symptoms and low BP. And to hold AM dose until he hears back from our office regarding dosage.  Will forward to Marrianne Mood, [place] for further recc.  Pt aware to call back with any changes in the meantime.

## 2020-08-27 ENCOUNTER — Ambulatory Visit: Payer: No Typology Code available for payment source

## 2020-08-27 NOTE — Telephone Encounter (Signed)
Per patient's chart [employer] has been added as insurance. Prior Authorization for [employer] sent to [place] via phone call. Per [employer] no PA is required. Plan is a self funded plan. Call reference # [id number].  Message will be sent to ordering provider ok to activate PIN.

## 2020-08-27 NOTE — Telephone Encounter (Signed)
Spoke to pt. Notified of PIN number for [place] study. Reviewed instructions with pt.  Pt has no further questions at this time.

## 2020-08-29 ENCOUNTER — Encounter: Payer: No Typology Code available for payment source | Attending: Cardiovascular Disease | Admitting: *Deleted

## 2020-08-29 ENCOUNTER — Encounter: Payer: Self-pay | Admitting: Physician Assistant

## 2020-08-29 ENCOUNTER — Ambulatory Visit (INDEPENDENT_AMBULATORY_CARE_PROVIDER_SITE_OTHER): Payer: No Typology Code available for payment source | Admitting: Physician Assistant

## 2020-08-29 ENCOUNTER — Ambulatory Visit: Payer: No Typology Code available for payment source

## 2020-08-29 ENCOUNTER — Other Ambulatory Visit: Payer: Self-pay

## 2020-08-29 VITALS — BP 130/70 | HR 64 | Ht 70.0 in | Wt 215.0 lb

## 2020-08-29 DIAGNOSIS — M609 Myositis, unspecified: Secondary | ICD-10-CM

## 2020-08-29 DIAGNOSIS — Z5189 Encounter for other specified aftercare: Secondary | ICD-10-CM | POA: Insufficient documentation

## 2020-08-29 DIAGNOSIS — Z79899 Other long term (current) drug therapy: Secondary | ICD-10-CM

## 2020-08-29 DIAGNOSIS — I25118 Atherosclerotic heart disease of native coronary artery with other forms of angina pectoris: Secondary | ICD-10-CM | POA: Diagnosis not present

## 2020-08-29 DIAGNOSIS — Z951 Presence of aortocoronary bypass graft: Secondary | ICD-10-CM

## 2020-08-29 DIAGNOSIS — R5383 Other fatigue: Secondary | ICD-10-CM

## 2020-08-29 DIAGNOSIS — K219 Gastro-esophageal reflux disease without esophagitis: Secondary | ICD-10-CM

## 2020-08-29 DIAGNOSIS — E118 Type 2 diabetes mellitus with unspecified complications: Secondary | ICD-10-CM

## 2020-08-29 DIAGNOSIS — E039 Hypothyroidism, unspecified: Secondary | ICD-10-CM

## 2020-08-29 DIAGNOSIS — I5042 Chronic combined systolic (congestive) and diastolic (congestive) heart failure: Secondary | ICD-10-CM

## 2020-08-29 DIAGNOSIS — I10 Essential (primary) hypertension: Secondary | ICD-10-CM

## 2020-08-29 DIAGNOSIS — I214 Non-ST elevation (NSTEMI) myocardial infarction: Secondary | ICD-10-CM

## 2020-08-29 DIAGNOSIS — G4730 Sleep apnea, unspecified: Secondary | ICD-10-CM

## 2020-08-29 DIAGNOSIS — I255 Ischemic cardiomyopathy: Secondary | ICD-10-CM

## 2020-08-29 DIAGNOSIS — I498 Other specified cardiac arrhythmias: Secondary | ICD-10-CM

## 2020-08-29 DIAGNOSIS — T466X5A Adverse effect of antihyperlipidemic and antiarteriosclerotic drugs, initial encounter: Secondary | ICD-10-CM

## 2020-08-29 DIAGNOSIS — I252 Old myocardial infarction: Secondary | ICD-10-CM | POA: Diagnosis not present

## 2020-08-29 DIAGNOSIS — R7401 Elevation of levels of liver transaminase levels: Secondary | ICD-10-CM

## 2020-08-29 DIAGNOSIS — Z789 Other specified health status: Secondary | ICD-10-CM

## 2020-08-29 DIAGNOSIS — E785 Hyperlipidemia, unspecified: Secondary | ICD-10-CM

## 2020-08-29 DIAGNOSIS — I493 Ventricular premature depolarization: Secondary | ICD-10-CM

## 2020-08-29 LAB — GLUCOSE, CAPILLARY
Glucose-Capillary: 171 mg/dL — ABNORMAL HIGH (ref 70–99)
Glucose-Capillary: 117 mg/dL — ABNORMAL HIGH (ref 70–99)

## 2020-08-29 MED ORDER — NITROGLYCERIN 0.4 MG SL SUBL: 0.4000 mg | SUBLINGUAL_TABLET | SUBLINGUAL | 4 refills | Status: AC | PRN

## 2020-08-29 MED ORDER — EZETIMIBE 10 MG PO TABS: 10.0000 mg | ORAL_TABLET | Freq: Every day | ORAL | 11 refills | Status: DC

## 2020-08-29 MED ORDER — ENTRESTO 24-26 MG PO TABS: 1.0000 | ORAL_TABLET | Freq: Two times a day (BID) | ORAL | 11 refills | Status: DC

## 2020-08-29 MED ORDER — CLOPIDOGREL BISULFATE 75 MG PO TABS: 75.0000 mg | ORAL_TABLET | Freq: Every day | ORAL | 11 refills | Status: DC

## 2020-08-29 MED ORDER — TORSEMIDE 10 MG PO TABS: 10.0000 mg | ORAL_TABLET | Freq: Every day | ORAL | 11 refills | Status: DC

## 2020-08-29 MED ORDER — ATORVASTATIN CALCIUM 10 MG PO TABS: 10.0000 mg | ORAL_TABLET | Freq: Every day | ORAL | 11 refills | Status: DC

## 2020-08-29 NOTE — Patient Instructions (Signed)
Medication Instructions:  Decreasing ENTRESTO to 24-26 TWICE a day  *If you need a refill on your cardiac medications before your next appointment, please call your pharmacy*   Lab Work: Lipid, Direct LDL, CMET, CBC, TSH, T-4, A1C,   Walk into medical mall at the check in desk, they will direct you to lab registration, hours for labs are Monday-Friday 07:00am-5:30pm (no appointment necessary) LABS WILL APPEAR ON MYCHART, ABNORMAL RESULTS WILL BE CALLED  Testing/Procedures: ECHO (3 months)  Your physician has requested that you have an echocardiogram. Echocardiography is a painless test that uses sound waves to create images of your heart. It provides your doctor with information about the size and shape of your heart and how well your heart's chambers and valves are working. This procedure takes approximately one hour. There are no restrictions for this procedure.  There is a possibility that an IV may need to be started during your test to inject an image enhancing agent. This is done to obtain more optimal pictures of your heart. Therefore we ask that you do at least drink some water prior to coming in to hydrate your veins.     Follow-Up: At [facility], you and your health needs are our priority.  As part of our continuing mission to provide you with exceptional heart care, we have created designated Provider Care Teams.  These Care Teams include your primary Cardiologist (physician) and Advanced Practice Providers (APPs -  Physician Assistants and Nurse Practitioners) who all work together to provide you with the care you need, when you need it.   Your next appointment:   As schedule with Dr. Rockey Situ   Other Instructions  Please take one extra tablet of torsemide 10mg  qd (for 20mg  total) for your weight gain and then go back down to your usual torsemide 10mg  qd

## 2020-08-29 NOTE — Progress Notes (Signed)
Office Visit    Patient Name: Todd Kelley Date of Encounter: 08/29/2020  PCP:  Tysinger, David S, [place]  Cardiologist:  Ida Rogue, MD  Advanced Practice Provider:  No care team member to display Electrophysiologist:  None   Chief Complaint    Chief Complaint  Patient presents with   Follow-up    Follow up and review test results. Medications verbally reviewed with patient.     55 y.o. male with history of CAD s/p prior lateral MI (2000) and CABG x4 in 81/2751 with complicated post op course including tamponade requiring window, DVT, HTN, HLD, DM2, tobacco and vape use, GERD, hypothyroidism, palpitations, ED, and who is being seen today for review of MPI.   Past Medical History    Past Medical History:  Diagnosis Date   Acid reflux    Allergic asthma    rare flare up, worse with alllergies as of 12/2016   Coronary artery disease    a. 08/1998 Lateral MI/PCI/BMS [place]; b. 2001 PTCA x 2 2/2 ISR in [place]; c. 2002 PCI/brachyrx 2/2 ISR in [place]; d. 01/2001 CABG x 4: LIMA->LAD, RIMA->RI, VG->RPL->RPDA; e. 01/2014 MV: Low risk w/ attenuation artifact and mild inf basal isch. Nl EF->Med rx.   DVT (deep venous thrombosis) ([place])    a. 01/2001 after CABG.   Erectile dysfunction    Hyperlipidemia LDL goal <70    Hypertension    Hypothyroidism    Palpitations    Pericardial effusion    a. 01/2001 Effusion w/ tamponade req window following CABG.   Tobacco use    vape, no cigarrette since 04/2015   Type II diabetes mellitus ([place])    Past Surgical History:  Procedure Laterality Date   APPENDECTOMY  2001   CARDIAC CATHETERIZATION  2000   stent   COLONOSCOPY WITH PROPOFOL N/A 03/17/2018   Procedure: COLONOSCOPY WITH PROPOFOL;  Surgeon: Lucilla Lame, MD;  Location: [place];  Service: Endoscopy;  Laterality: N/A;   CORONARY ANGIOPLASTY  jan. 2001   CORONARY ANGIOPLASTY WITH STENT PLACEMENT  june 2001   stent   CORONARY ARTERY BYPASS GRAFT   dec. 2002   LIMA graft to LAD,RIMA graft to the intermediate branch and saphenous vein graft  to the posterior lateral and posterior descending branches of the right coronary   LEFT HEART CATH AND CORS/GRAFTS ANGIOGRAPHY N/A 07/28/2020   Procedure: LEFT HEART CATH AND CORS/GRAFTS ANGIOGRAPHY;  Surgeon: Wellington Hampshire, MD;  Location: [facility];  Service: Cardiovascular;  Laterality: N/A;   POLYPECTOMY  03/17/2018   Procedure: POLYPECTOMY;  Surgeon: Lucilla Lame, MD;  Location: [facility];  Service: Endoscopy;;    Allergies  Allergies  Allergen Reactions   Farxiga [Dapagliflozin]     weak   Statins     Myalgias other than low dose   Tetracycline     REACTION: Nervous,shakey    History of Present Illness    Todd Kelley is a 55 y.o. male with PMH as above and including CAD, HTN, HLD, DM2, tobacco and vape use, GER, hypothyroidism, palpitations, ED.  Cardiac history dates back to 2000, when he suffered a lateral MI with BMS of ramus intermedius. He had recurrent in stent restenosis, requiring PTCA x2 in 2001 and then PCI and brachytherapy in 2002. Due to in stent restenosis in 01/2001, decision was made for CABG 4. Postoperative course was complicated by tamponade, requiring a pericardial window, as well as  a DVT. He followed up with Dr. Martinique. He underwent stress test in 2015 that was low risk. He then moved to [place], [place]. He was doing well from a cardiac standpoint until presenting to [facility] 06/2020 with chest pain.   3-4 weeks before his admission, he noted exertional CP rated 3/10 while walking at work. CP was associated with jaw discomfort. Sx usually resolved after 5 minutes. After that time, he noted intermittent upper chest and jaw discomfort that occurred 1-2 times per week without rhyme or reason and was able to do yard work and other activities without sx. Starting about 2 weeks before his admission, he had more exertional sx that initially prompted him to make an  OP Cardiology visit with [facility]. The episodes continued, eventually resulting in his presenting to the Emergency Department. Initial BP 183/100. Nitrates were avoided due to recent Cialis usage. Initial HS Tn 168. The first night in the hospital, he continued to have CP throughout the night with Tn increase to [address].0. He underwent cath 5/30 that showed severe native CAD and patent bypass grafts. The culprit for his NSTEMI was not thought obvious with possible spontaneous recanalization or small branch occlusion. He had CP after the cath that resolved with SL nitro and did not recur. It was noted nitrates should not be used at home due to tadalafil daily at home for ED and BPH. Recommendation was for DAPT for 12 months and control of LDL.   It was recommended he stay out of work until seen at follow-up in 2 weeks. If recurrent CP, ranolazone 500mg  BID was recommended as PDE5i precluded the addition of long acting nitrate. Documentation shows it was suggested he discuss transitioning to alternative therapy with his urologist to allow for addition of long acting nitrate. Escalation of BB was precluded by low resting HR. He was continued on irbesartan with recommendation to consider transition to [facility] in the future if EF did not improve. He was intolerant to high intensity statin therapy and even had myalgias with Crestor 10mg  daily but agreeable to try atorvastatin 10mg  daily with Zetia. PCSK9i were recommended in the future if needed for LDL control.   On 08/07/2020, a phone note documents that he called with concern of shortness of breath over the last week.  Specifically, he noted shortness of breath with exertion.  He stated this shortness of breath was not similar to that reported before, and that it was less severe.  Recommendations were to be mindful of his shortness of breath and make sure that he rested appropriately.  Seen 08/11/2020 for follow-up without any chest pain or shortness of breath at rest.  He  reported fatigue and dyspnea with walking.  He also reported a soreness in his upper epigastric area with walking, improving with sitting up straight or resting.  He attributed this pain to musculoskeletal etiology.  He reported trivial LEE above his sock line.  He also noted bendopnea and dizziness when bending over right after taking his medications.  He felt bloating and uncertain if related to food.  He reported inconsistent appetite, though very hungry lately.  He had tachypalpitations with pulse usually 55 to 60 bpm whenever he checked it during that time.  Over the last week, he noted more fatigue and low motivation.  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  Usually, he will take a 30-minute nap.  Return restrictions provided for work with  associated work note.  Home SBP 130s to 140s DBP 80s to 90s with highest BP 143/98.  He stated Wilder Glade made him tired, though he had yet to start the Jardiance prescribed at discharge.  He was no longer taking Cialis.  Cardiac rehab orientation with 08/19/2020.  Right femoral arteriotomy site stable without any evidence of swelling or infection.  He reported mild right femoral soreness after the procedure but not today.  He was vaping daily with a 5 mL tank.  As needed sublingual nitro was prescribed and repeat labs collected.  Due to his tachypalpitations, Zio XT x2 weeks was also prescribed.  It was noted that Entresto could be considered in the future, as well as PCSK9 inhibitors if LDL not well controlled.  Outpatient sleep study was also recommended given accelerated junctional bradycardia seen on EKG that day, as well as patient report of snoring.  On 08/14/2020, he called the office and reported exertional chest pain on 6/15 x 2.  The first episode occurred when moving a hose outside 6/15.  CP was described as a pressure/pain that began in the jaw and throat and then extended down into the chest,  eventually dissipating without clear alleviating factors other than time.  The second episode occurred during intercourse and dissipated with immediate breast due to symptoms.  CP this time felt the same as previous episode and began in his throat and jaw, then started to move down to his chest.  On 6/16, he woke with some discomfort in his chest.  BP 134/97 with HR 58 bpm.  He decided to call the office with recommendation for same-day visit.  When seen in clinic same day 6/16, he reported the CP is similar to that before his NSTEMI, yet not as severe.  He had palpitations and was wearing his cardiac monitoring.  He had pressed the trigger 3 times, including last night and today.  He continued to report some bendopnea, mostly occurring after taking his medicine.  He continued to note swelling around the sock line.  He continued to have upper epigastric pain once in a while and feelings of being bloated even if not directly related to food.  EKG obtained and without acute ST/T changes but still showing accelerated junctional rhythm.  Based on his catheterization images and EKG, recommendation was for stat troponin to ensure high-sensitivity troponin downtrending.  Subsequent stat high-sensitivity troponin was downtrending and patient informed same day.  Coreg was discontinued due to junctional rhythm.  He was started on torsemide 10 mg daily to see if this alleviated his bendopnea and given suspicion for volume overload.  BMET ordered.  In addition, MPI was ordered with results as below.  It was agreed that cardiac monitoring and watch [place] Din study, as recommended at his previous clinic visit, would be helpful studies based on his presentation.  Future considerations could include repeat echo, Entresto, spironolactone, and PCSK9 inhibitor.  Stress test was completed with results called to patient on 08/23/2020.  Overall, stress test was revealed moderate risk.  Study showed medium mild to moderate severity defect in  the basal anterior lateral and mid inferior septal location.  The defect in the basal anterior lateral location was partially reversible, while the defect in the inferior septal location was fixed and thought artifactual.  LV moderately decreased at 38%.  Overall, stress test was ruled intermediate risk.  It was noted that findings were consistent with prior myocardial infarction and with mild peri-infarct ischemia in the anterior lateral position.  Reviewed study with interventional team with recommendation for ongoing medical management based on his catheterization images.  Today, he RTC to review MPI and reassess sx, especially after holding his beta-blocker and initiation of torsemide. He reports a pain in his chest at cardiac rehab earlier same day with pressure in his throat. He turned down the speed of his machine, and this discomfort slowly dissipated. No DOE or presyncope during this episode. No SOB/CP at rest. He discontinued his BB as recommended and notes improvement with this change. He did report an episode of BP down to 99/63 with associated feeling that he was light headed at work. Lower BP was in the setting of starting Entresto 49-51mg  BID with pt then holding his Entresto and improvement in sx/BP. He notes fatigue at the end of the day. He has some palpitations with monitor pending. He reports regular urination, though he has noticed the fluid has tapered off lately.  He is trying to eat healthy and reports eating to yogurt and peanut butter toast in the morning.  He reports that he needs to eat a hearty breakfast with peanut butter toast, otherwise he loses steam, especially on days of cardiac rehab.  He has been very satisfied with the fact that his sugars have been doing very well lately.  He reports feeling bloated at times and still noticing pain in his stomach every once in a while.  He has shortness of breath or dizziness if bending over/symptoms consistent with bendopnea.  He does note  that for the first hour each morning he is careful with position changes.  He takes all medications at approximately 6 AM every morning.  He denies any signs or symptoms of bleeding.  He reports medication compliance.  Results of stress testing reviewed.  Addendum: Also reviewed is note sent over from cardiac rehab regarding rhythm strips with current Zio monitor returned and pending official read by primary cardiologist.  Home Medications   Current Outpatient Medications  Medication Instructions   aspirin (ASPIRIN LOW DOSE) 81 mg, Oral, Daily, Swallow whole.   atorvastatin (LIPITOR) 10 mg, Oral, Daily   cetirizine (ZYRTEC) 10 mg, Oral, Daily   clopidogrel (PLAVIX) 75 mg, Oral, Daily with breakfast   Continuous Blood Gluc Receiver (DEXCOM G6 RECEIVER) DEVI Use as instructed   Continuous Blood Gluc Sensor (DEXCOM G6 SENSOR) MISC Use for every 14 days.   Continuous Blood Gluc Transmit (DEXCOM G6 TRANSMITTER) MISC Use as instructed   empagliflozin (JARDIANCE) 10 mg, Oral, Daily before breakfast   escitalopram (LEXAPRO) 10 mg, Oral, Daily   ezetimibe (ZETIA) 10 mg, Oral, Daily   fenofibrate (TRICOR) 145 MG tablet TAKE 1 TABLET BY MOUTH  DAILY   levothyroxine (SYNTHROID) 150 mcg, Oral, Daily before breakfast   metFORMIN (GLUCOPHAGE XR) 500 mg, Oral, Daily with breakfast   Multiple Vitamins-Minerals (MULTIVITAMIN ADULT) CHEW 1 tablet, Oral, Daily   nitroGLYCERIN (NITROSTAT) 0.4 mg, Sublingual, Every 5 min PRN   PROAIR RESPICLICK 867 (90 Base) MCG/ACT AEPB USE 2 INHALATIONS BY MOUTH  EVERY 6 HOURS AS NEEDED   torsemide (DEMADEX) 10 mg, Oral, Daily     Review of Systems    He reports an episodes of chest discomfort that begins in the jaw/throat that occurred on the treadmill. With diuresis, he notes improved epigastric pain, bendopnea, LEE, and abdominal distention. No pnd, orthopnea, v, syncope, weight gain. Breathing stable. He reports palpitations.  He is fatigued at the end of the day.All  other systems reviewed and are otherwise negative  except as noted above.  Physical Exam    VS:  BP 130/70 (BP Location: Left Arm, Patient Position: Sitting, Cuff Size: Normal)   Pulse 64   Ht 5\' 10"  (1.778 m)   Wt 215 lb (97.5 kg)   SpO2 98%   BMI 30.85 kg/m  , BMI Body mass index is 30.85 kg/m. GEN: Well nourished, well developed, in no acute distress. HEENT: normal. Neck: Supple, no JVD, carotid bruits, or masses. Cardiac: RRR, no murmurs, rubs, or gallops. No clubbing, cyanosis, mild bilateral non-pitting edema.  Radials/DP/PT 2+ and equal bilaterally.  Respiratory:  Respirations regular and unlabored, clear to auscultation bilaterally. GI: Soft, nontender, nondistended, BS + x 4. MS: no deformity or atrophy. Skin: warm and dry, no rash. Neuro:  Strength and sensation are intact. Psych: Normal affect.  Accessory Clinical Findings    ECG personally reviewed by me today - no EKG- no acute changes.  VITALS Reviewed today   Temp Readings from Last 3 Encounters:  07/31/20 98.5 F (36.9 C)  07/29/20 98.1 F (36.7 C)  03/18/20 98.6 F (37 C) (Oral)   BP Readings from Last 3 Encounters:  08/29/20 130/70  08/14/20 (!) 138/100  08/11/20 130/80   Pulse Readings from Last 3 Encounters:  08/29/20 64  08/14/20 62  08/11/20 65    Wt Readings from Last 3 Encounters:  08/29/20 215 lb (97.5 kg)  08/19/20 218 lb (98.9 kg)  08/14/20 219 lb 6 oz (99.5 kg)     LABS  reviewed today   Lab Results  Component Value Date   WBC 8.1 07/28/2020   HGB 15.1 07/28/2020   HCT 43.9 07/28/2020   MCV 84.7 07/28/2020   PLT 160 07/28/2020   Lab Results  Component Value Date   CREATININE 1.27 (H) 08/14/2020   BUN 15 08/14/2020   NA 141 08/14/2020   K 4.7 08/14/2020   CL 106 08/14/2020   CO2 29 08/14/2020   Lab Results  Component Value Date   ALT 32 07/27/2020   AST 63 (H) 07/27/2020   ALKPHOS 45 07/27/2020   BILITOT 0.9 07/27/2020   Lab Results  Component Value Date   CHOL  207 (H) 07/26/2020   HDL 41 07/26/2020   LDLCALC 138 (H) 07/26/2020   TRIG 141 07/26/2020   CHOLHDL 5.0 07/26/2020    Lab Results  Component Value Date   HGBA1C 7.4 (H) 07/25/2020   Lab Results  Component Value Date   TSH 3.300 08/11/2020     STUDIES/PROCEDURES reviewed today   MPI 08/20/2020 There is a medium mild of moderate severity present in the basal anterolateral and mid inferoseptal location. Defect in the basal anterolateral location is partially reversible, while defect in the inferoseptal location appears fixed/artifactual. The left ventricular ejection fraction is moderately decreased (38%). This is an intermediate risk study. Findings consistent with prior myocardial infarction with mild peri-infarct ischemia in the anterolateral position.   STOP-BANG Rex assessment S-snore  Have you been told that you snore?   1  T-tired Are you often tired, fatigued, sleepy during the day?   1  O-obstruction Do you stop breathing, choke, or gasp during sleep?   0  P-pressure Do you have or are you being treated for high blood pressure?  1  B- BMI Is your BMI greater than 35 kg/m?  0  A- age Are you 25 years or older?  1  N-neck Do you have a neck circumference greater than 16 inches?  0  G-gender  Are you male?   1  Total  5   Echo 07/28/20  1. Left ventricular ejection fraction, by estimation, is 35 to 40%. The  left ventricle has moderately decreased function. The left ventricle  demonstrates regional wall motion abnormalities (Hypokinesis of the  anterior and anteroseptal wall). There is  moderate left ventricular hypertrophy. Left ventricular diastolic  parameters are consistent with Grade I diastolic dysfunction (impaired  relaxation).   2. Right ventricular systolic function is normal. The right ventricular  size is normal.   3. Left atrial size was mildly dilated.   4. Frequent PVCs   LHC 07/28/20 Wall Motion  Resting                 Left  Heart  Left Ventricle There is moderate left ventricular systolic dysfunction. LV end diastolic pressure is normal. The left ventricular ejection fraction is 35-45% by visual estimate. There are LV function abnormalities due to global hypokinesis.    Coronary Diagrams   Diagnostic Dominance: Right     Ramus lesion is 100% stenosed. Ost Cx to Prox Cx lesion is 85% stenosed. Prox LAD to Mid LAD lesion is 100% stenosed. 1st Diag lesion is 80% stenosed. There is moderate left ventricular systolic dysfunction. The left ventricular ejection fraction is 35-45% by visual estimate. LV end diastolic pressure is normal. Origin to Prox Graft lesion is 50% stenosed. RPDA lesion is 60% stenosed. RIMA graft was visualized by angiography and is normal in caliber. LIMA graft was visualized by angiography and is normal in caliber. The graft exhibits no disease. Mid LAD lesion is 85% stenosed. 1.  Severe underlying three-vessel coronary artery disease with patent grafts including LIMA to LAD, RIMA to ramus and SVG to right PDA.  There is significant mid to distal LAD stenosis proximal to the LIMA anastomosis, significant ostial left circumflex disease but the left circumflex distribution is relatively small.  There is moderate proximal disease affecting the SVG to right PDA. 2.  Moderately reduced LV systolic function with an EF of 35 to 40% with global hypokinesis. 3.  Normal left ventricular end-diastolic pressure. Recommendations: No clear culprit is identified for the patient's non-ST elevation myocardial infarction.  Vessel occlusion with spontaneous recanalization is a possibility especially in the SVG to right PDA. Discontinue heparin for now.  I elected to add clopidogrel for 1 year. Obtain an echocardiogram to better evaluate ejection fraction. I resumed small dose carvedilol given cardiomyopathy and PVCs.  Monitor for bradycardia. In the future if the patient has chronic anginal symptoms.   Nuclear stress testing can be considered to see if there are areas of ischemia and guide revascularization if needed.  02/08/2014 MPI Impression Exercise Capacity:  Good exercise capacity. BP Response:  Normal blood pressure response. Clinical Symptoms:  No chest pain or dyspnea. ECG Impression:  No significant ST segment change suggestive of ischemia. Comparison with Prior Nuclear Study: Inferior ischemia new compared to previous Overall Impression:  Low risk stress nuclear study with a small, moderate intensity, partially reversible basal inferior lateral defect consistent with soft tissue attenuation and mild ischemia in the inferior basal wall.. LV Wall Motion:  NL LV Function; NL Wall Motion   Assessment & Plan    Exertional Angina with History of 06/2020 NSTEMI Coronary artery disease s/p lateral MI (2000) and CABG x4 (01/2001) ---Episode during cardiac rehab with CP as in HPI and similar in character to that of his recent NSTEMI but less severe in intensity. MPI ruled moderate risk  as above but thought that consistent with prior MI with mild peri-infarct ischemia. 5/30 LHC: severe CAD, patent grafts, unclear culprit for NSTEMI.  5/30 echo EF 35 to 40%, hypokinesis of anterior/anteroseptal wall, moderate LVH, G1 DD, mild LAE, frequent PVCs. It has been thought possible that vessel occlusion occurred with spontaneous recanalization in the SVG to rPDA. Past cardiac hx includes lateral MI (2000) & CABG x4 in 2002 with subsequent cardiac tamponade and need for window. MPI reviewed and discussed results are reassuring, given r/o high risk ischemia with recommendation for medical management at this time. Some improvement noted in overall sx with diuresis as below. Echo ordered today to reassess EF. Continue DAPT. No BB given h/o junctional bradycardia. Continue statin, PRN SL nitro and with pt knowledgeable of interaction with Cialis and no longer uses this medication.   Combined systolic and  diastolic heart failure Ischemic cardiomyopathy --Improved sx with start of torsemide 10mg  qd with significant improvement in DBP today and wt decreased from 219lbs to 215lbs in ~2w.  LVEF 35-40% as outlined above on recent echo. He will continue to monitor urine output, wt, BP, and sx. Repeat echo ordered to reassess and pending. BB stopped 2/2 junctional rhythm. Continue Jardiance. Start lower dose Entresto 24-26 BID, as he did not tolerate Entresto 49-51 BID. If BP/ Cr/K permit at RTC, Spironolactone could be considered.   Palpitations, dizziness PVCs, junctional rhythms --Ongoing palpitations reported and rhythm strips provided from rehab. Not on a BB given junctional rhythm. Awaiting results of Zio XT, placed 6/13, to rule out significant arrhythmia or high burden of ectopy as contributing to sx and EF. Watchpat ordered and results pending given snoring / junctional bradycardia during admission= pt STOPGAP score 5. Repeat BMET today to check electroltyes. Previous TSH wnl and will recheck thyroid labs today as well.  HLD, LDL goal <70 Hypertriglyceridemia History of statin intolerance (with higher statin doses) --Intolerant to high intensity statin therapy and myalgias with Crestor.  Started on atorvastatin 10 mg daily, tolerating this well. 07/26/20 labs show total cholesterol 207, LDL 138, HDL 41, Tg 141. Current LDL above goal, Tg at goal. Continue atorvastatin with Zetia, continue fenofibrate. Will recheck lipids/LFTs (ordered today). Consider PCSK9i in the future if needed for LDL control below 70.  Transaminitis --Noted during admission with AST 63. ALT wnl. We will plan for recheck LFTs, ordered today. Continue statin for now.    Essential HTN, goal BP 130/80 or lower --BP mildly elevated and with DBP significantly improved with diuresis, which is reassuring. He did not tolerate Entresto 49-51 BID due to low BP and with change today to Entresto 24-26 BID. BB discontinued at prior visit  given junctional rhythm. Continue torsemide with improved volume status with wt down from 219lbs to 215lbs. Keep total daily fluid under 2 L/day and salt under 2 g/day.  Monitor BP at home with goal BP 130/80 or lower.  DM2 --Hemoglobin A1C 7.1 03/2020. Glycemic control recommended. Continue Jardiance in the setting of HFrEF.   Hypothyroidism  -- TSH /FT4 ordered.  Continue Synthroid.  Vape use --Complete cessation advised.    Sleep disordered breathing --WatchPat ordered/pending as above.  GERD --Continue PPI. -----------------------------------------------------------------------------------  Medication changes: (needs Refills on all meds) --Stop Irbesartan --Start Entresto 24-26 BID Labs ordered: (including labs for PCP) --CMP  --CBC --LDL, lipids --TSH, FT4 --A1C Studies / Imaging ordered: (including those pending results)  --Echo (~ 3 months to reassess on GDMT) --Zio completed, pending results --WatchPat, which is already ordered  Future considerations:  --Kathryne Gin   Disposition: RTC as scheduled with Dr. Rockey Situ 7/25, at which time monitor results can also be reviewed  *Please be aware that the above documentation was completed voice recognition software and may contain dictation errors.    Arvil Chaco, PA-C 08/29/2020

## 2020-08-29 NOTE — Progress Notes (Signed)
Daily Session Note  Patient Details  Name: Todd Kelley MRN: [id number] Date of Birth: [birth year] Referring Provider:   Flowsheet Row Cardiac Rehab from 08/19/2020 in [facility] Cardiac and Pulmonary Rehab  Referring Provider Kathlyn Sacramento MD  [facility] Cardiologist: Dr. Ida Rogue       Encounter Date: 08/29/2020  Check In:      Social History   Tobacco Use  Smoking Status Former   Packs/day: 0.50   Years: 10.00   Pack years: 5.00   Types: Cigarettes   Quit date: 04/08/2015   Years since quitting: 5.3  Smokeless Tobacco Never    Goals Met:  Exercise tolerated well No report of cardiac concerns or symptoms  Goals Unmet:  Not Applicable  Comments: Pt able to follow exercise prescription today without complaint.  Will continue to monitor for progression.    Dr. Emily Filbert is Medical Director for [place].  Dr. Ottie Glazier is Medical Director for [facility] Pulmonary Rehabilitation.

## 2020-09-03 ENCOUNTER — Other Ambulatory Visit: Payer: Self-pay

## 2020-09-03 ENCOUNTER — Ambulatory Visit: Payer: No Typology Code available for payment source

## 2020-09-03 DIAGNOSIS — Z5189 Encounter for other specified aftercare: Secondary | ICD-10-CM | POA: Diagnosis not present

## 2020-09-03 DIAGNOSIS — I214 Non-ST elevation (NSTEMI) myocardial infarction: Secondary | ICD-10-CM

## 2020-09-03 LAB — GLUCOSE, CAPILLARY
Glucose-Capillary: 155 mg/dL — ABNORMAL HIGH (ref 70–99)
Glucose-Capillary: 109 mg/dL — ABNORMAL HIGH (ref 70–99)

## 2020-09-03 NOTE — Progress Notes (Signed)
Daily Session Note  Patient Details  Name: Todd Kelley MRN: [id number] Date of Birth: 1965/12/03 Referring Provider:   Flowsheet Row Cardiac Rehab from 08/19/2020 in [facility] Cardiac and Pulmonary Rehab  Referring Provider Kathlyn Sacramento MD  [facility] Cardiologist: Dr. Ida Rogue       Encounter Date: 09/03/2020  Check In:  Session Check In - 09/03/20 [place]       Check-In   Supervising physician immediately available to respond to emergencies See telemetry face sheet for immediately available ER MD    Location [facility]-Cardiac & Pulmonary Rehab    Staff Present Birdie Sons, MPA, RN;Meredith Sherryll Burger, RN BSN;Melissa Caiola, RDN, LDN;Joseph Fletcher, Sharren Bridge, MS, ASCM CEP, Exercise Physiologist    Virtual Visit No    Medication changes reported     No    Fall or balance concerns reported    No    Tobacco Cessation No Change    Warm-up and Cool-down Performed on first and last piece of equipment    Resistance Training Performed Yes    VAD Patient? No    PAD/SET Patient? No      Pain Assessment   Currently in Pain? No/denies                Social History   Tobacco Use  Smoking Status Former   Packs/day: 0.50   Years: 10.00   Pack years: 5.00   Types: Cigarettes   Quit date: 04/08/2015   Years since quitting: 5.4  Smokeless Tobacco Never    Goals Met:  Independence with exercise equipment Exercise tolerated well No report of cardiac concerns or symptoms Strength training completed today  Goals Unmet:  Not Applicable  Comments: Pt able to follow exercise prescription today without complaint.  Will continue to monitor for progression.    Dr. Emily Filbert is Medical Director for [place].  Dr. Ottie Glazier is Medical Director for [facility] Pulmonary Rehabilitation.

## 2020-09-04 ENCOUNTER — Encounter: Payer: No Typology Code available for payment source | Admitting: *Deleted

## 2020-09-04 DIAGNOSIS — I214 Non-ST elevation (NSTEMI) myocardial infarction: Secondary | ICD-10-CM

## 2020-09-04 DIAGNOSIS — Z5189 Encounter for other specified aftercare: Secondary | ICD-10-CM | POA: Diagnosis not present

## 2020-09-04 NOTE — Progress Notes (Signed)
Daily Session Note  Patient Details  Name: Todd Kelley MRN: [id number] Date of Birth: 1965/06/08 Referring Provider:   Flowsheet Row Cardiac Rehab from 08/19/2020 in [facility] Re[facility]  Referring Provider Kathlyn Sacramento MD  [facility] Cardiologist: Dr. Ida Rogue       Encounter Date: 09/04/2020  Check In:  Session Check In - 09/04/20 1716       Check-In   Supervising physician immediately available to respond to emergencies See telemetry face sheet for immediately available ER MD    Location [facility]-Cardiac & Pulmonary Rehab    Staff Present Renita Papa, RN BSN;Melissa Zinc, RDN, Tawanna Solo, MS, ASCM CEP, Exercise Physiologist    Virtual Visit No    Medication changes reported     No    Fall or balance concerns reported    No    Tobacco Cessation No Change    Warm-up and Cool-down Performed on first and last piece of equipment    Resistance Training Performed Yes    VAD Patient? No    PAD/SET Patient? No      Pain Assessment   Currently in Pain? No/denies                Social History   Tobacco Use  Smoking Status Former   Packs/day: 0.50   Years: 10.00   Pack years: 5.00   Types: Cigarettes   Quit date: 04/08/2015   Years since quitting: 5.4  Smokeless Tobacco Never    Goals Met:  Independence with exercise equipment Exercise tolerated well No report of cardiac concerns or symptoms Strength training completed today  Goals Unmet:  Not Applicable  Comments: Pt able to follow exercise prescription today without complaint.  Will continue to monitor for progression.    Dr. Emily Filbert is Medical Director for [facility].  Dr. Ottie Glazier is Medical Director for [facility] Pulmonary Rehabilitation.

## 2020-09-05 ENCOUNTER — Ambulatory Visit: Payer: No Typology Code available for payment source

## 2020-09-08 ENCOUNTER — Ambulatory Visit: Payer: No Typology Code available for payment source

## 2020-09-08 ENCOUNTER — Encounter: Payer: No Typology Code available for payment source | Admitting: *Deleted

## 2020-09-08 ENCOUNTER — Other Ambulatory Visit: Payer: Self-pay

## 2020-09-08 DIAGNOSIS — Z5189 Encounter for other specified aftercare: Secondary | ICD-10-CM | POA: Diagnosis not present

## 2020-09-08 DIAGNOSIS — I214 Non-ST elevation (NSTEMI) myocardial infarction: Secondary | ICD-10-CM

## 2020-09-08 NOTE — Progress Notes (Signed)
Daily Session Note  Patient Details  Name: Todd Kelley MRN: [id number] Date of Birth: [birth year] Referring Provider:   Flowsheet Row Cardiac Rehab from 08/19/2020 in [facility] Cardiac and Pulmonary Rehab  Referring Provider Kathlyn Sacramento MD  [facility] Cardiologist: Dr. Ida Rogue       Encounter Date: 09/08/2020  Check In:  Session Check In - 09/08/20 1723       Check-In   Supervising physician immediately available to respond to emergencies See telemetry face sheet for immediately available ER MD    Location [facility]-Cardiac & Pulmonary Rehab    Staff Present Renita Papa, RN Moises Blood, BS, ACSM CEP, Exercise Physiologist;Kelly Rosalia Hammers, MPA, RN    Virtual Visit No    Medication changes reported     No    Fall or balance concerns reported    No    Warm-up and Cool-down Performed on first and last piece of equipment    Resistance Training Performed Yes    VAD Patient? No    PAD/SET Patient? No      Pain Assessment   Currently in Pain? No/denies                Social History   Tobacco Use  Smoking Status Former   Packs/day: 0.50   Years: 10.00   Pack years: 5.00   Types: Cigarettes   Quit date: 04/08/2015   Years since quitting: 5.4  Smokeless Tobacco Never    Goals Met:  Independence with exercise equipment Exercise tolerated well No report of cardiac concerns or symptoms Strength training completed today  Goals Unmet:  Not Applicable  Comments: Pt able to follow exercise prescription today without complaint.  Will continue to monitor for progression.    Dr. Emily Filbert is Medical Director for [place].  Dr. Ottie Glazier is Medical Director for [facility] Pulmonary Rehabilitation.

## 2020-09-10 ENCOUNTER — Encounter: Payer: No Typology Code available for payment source | Admitting: *Deleted

## 2020-09-10 ENCOUNTER — Encounter: Payer: Self-pay | Admitting: *Deleted

## 2020-09-10 ENCOUNTER — Ambulatory Visit: Payer: No Typology Code available for payment source

## 2020-09-10 ENCOUNTER — Other Ambulatory Visit: Payer: Self-pay

## 2020-09-10 DIAGNOSIS — Z5189 Encounter for other specified aftercare: Secondary | ICD-10-CM | POA: Diagnosis not present

## 2020-09-10 DIAGNOSIS — I214 Non-ST elevation (NSTEMI) myocardial infarction: Secondary | ICD-10-CM

## 2020-09-10 NOTE — Progress Notes (Signed)
Daily Session Note  Patient Details  Name: Todd Kelley MRN: [id number] Date of Birth: [birth year] Referring Provider:   Flowsheet Row Cardiac Rehab from 08/19/2020 in [facility] Cardiac and Pulmonary Rehab  Referring Provider Kathlyn Sacramento MD  [facility] Cardiologist: Dr. Ida Rogue       Encounter Date: 09/10/2020  Check In:  Session Check In - 09/10/20 1722       Check-In   Supervising physician immediately available to respond to emergencies See telemetry face sheet for immediately available ER MD    Location [facility]-Cardiac & Pulmonary Rehab    Staff Present Renita Papa, RN Sherryl Barters, MPA, Nino Glow, MS, ASCM CEP, Exercise Physiologist    Virtual Visit No    Medication changes reported     No    Fall or balance concerns reported    No    Warm-up and Cool-down Performed on first and last piece of equipment    Resistance Training Performed Yes    VAD Patient? No    PAD/SET Patient? No      Pain Assessment   Currently in Pain? No/denies                Social History   Tobacco Use  Smoking Status Former   Packs/day: 0.50   Years: 10.00   Pack years: 5.00   Types: Cigarettes   Quit date: 04/08/2015   Years since quitting: 5.4  Smokeless Tobacco Never    Goals Met:  Independence with exercise equipment Exercise tolerated well No report of cardiac concerns or symptoms Strength training completed today  Goals Unmet:  Not Applicable  Comments: Pt able to follow exercise prescription today without complaint.  Will continue to monitor for progression.    Dr. Emily Filbert is Medical Director for [place].  Dr. Ottie Glazier is Medical Director for [facility] Pulmonary Rehabilitation.

## 2020-09-10 NOTE — Progress Notes (Signed)
Cardiac Individual Treatment Plan  Patient Details  Name: Todd Kelley MRN: [id number] Date of Birth: 01/06/1966 Referring Provider:   Flowsheet Row Cardiac Rehab from 08/19/2020 in [facility] Cardiac and Pulmonary Rehab  Referring Provider Kathlyn Sacramento MD  [facility] Cardiologist: Dr. Ida Rogue       Initial Encounter Date:  Flowsheet Row Cardiac Rehab from 08/19/2020 in [facility] Cardiac and Pulmonary Rehab  Date 08/19/20       Visit Diagnosis: NSTEMI (non-ST elevated myocardial infarction) [facility])  Patient's Home Medications on Admission:  Current Outpatient Medications:    aspirin (ASPIRIN LOW DOSE) 81 MG EC tablet, Take 1 tablet (81 mg total) by mouth daily. Swallow whole., Disp: 90 tablet, Rfl: 3   atorvastatin (LIPITOR) 10 MG tablet, Take 1 tablet (10 mg total) by mouth daily., Disp: 30 tablet, Rfl: 11   cetirizine (ZYRTEC) 10 MG tablet, Take 10 mg by mouth daily., Disp: , Rfl:    clopidogrel (PLAVIX) 75 MG tablet, Take 1 tablet (75 mg total) by mouth daily with breakfast., Disp: 30 tablet, Rfl: 11   Continuous Blood Gluc Receiver (DEXCOM G6 RECEIVER) DEVI, Use as instructed, Disp: 1 each, Rfl: 0   Continuous Blood Gluc Sensor (DEXCOM G6 SENSOR) MISC, Use for every 14 days., Disp: 3 each, Rfl: 2   Continuous Blood Gluc Transmit (DEXCOM G6 TRANSMITTER) MISC, Use as instructed, Disp: 1 each, Rfl: 0   empagliflozin (JARDIANCE) 10 MG TABS tablet, Take 1 tablet (10 mg total) by mouth daily before breakfast., Disp: 30 tablet, Rfl: 2   escitalopram (LEXAPRO) 10 MG tablet, Take 1 tablet (10 mg total) by mouth daily., Disp: 90 tablet, Rfl: 3   ezetimibe (ZETIA) 10 MG tablet, Take 1 tablet (10 mg total) by mouth daily., Disp: 30 tablet, Rfl: 11   fenofibrate (TRICOR) 145 MG tablet, TAKE 1 TABLET BY MOUTH  DAILY, Disp: 90 tablet, Rfl: 0   levothyroxine (SYNTHROID) 150 MCG tablet, Take 1 tablet (150 mcg total) by mouth daily before breakfast., Disp: 90 tablet, Rfl: 1   metFORMIN (GLUCOPHAGE XR)  500 MG 24 hr tablet, Take 1 tablet (500 mg total) by mouth daily with breakfast., Disp: 30 tablet, Rfl: 11   Multiple Vitamins-Minerals (MULTIVITAMIN ADULT) CHEW, Chew 1 tablet by mouth daily., Disp: , Rfl:    nitroGLYCERIN (NITROSTAT) 0.4 MG SL tablet, Place 1 tablet (0.4 mg total) under the tongue every 5 (five) minutes as needed for chest pain., Disp: 25 tablet, Rfl: 4   PROAIR RESPICLICK 357 (90 Base) MCG/ACT AEPB, USE 2 INHALATIONS BY MOUTH  EVERY 6 HOURS AS NEEDED, Disp: 1 each, Rfl: 0   sacubitril-valsartan (ENTRESTO) 24-26 MG, Take 1 tablet by mouth 2 (two) times daily., Disp: 60 tablet, Rfl: 11   torsemide (DEMADEX) 10 MG tablet, Take 1 tablet (10 mg total) by mouth daily., Disp: 30 tablet, Rfl: 11  Past Medical History: Past Medical History:  Diagnosis Date   Acid reflux    Allergic asthma    rare flare up, worse with alllergies as of 12/2016   Coronary artery disease    a. 08/1998 Lateral MI/PCI/BMS [place]; b. 2001 PTCA x 2 2/2 ISR in [place]; c. 2002 PCI/brachyrx 2/2 ISR in [place]; d. 01/2001 CABG x 4: LIMA->LAD, RIMA->RI, VG->RPL->RPDA; e. 01/2014 MV: Low risk w/ attenuation artifact and mild inf basal isch. Nl EF->Med rx.   DVT (deep venous thrombosis) ([place])    a. 01/2001 after CABG.   Erectile dysfunction    Hyperlipidemia LDL goal <70  Hypertension    Hypothyroidism    Palpitations    Pericardial effusion    a. 01/2001 Effusion w/ tamponade req window following CABG.   Tobacco use    vape, no cigarrette since 04/2015   Type II diabetes mellitus ([place])     Tobacco Use: Social History   Tobacco Use  Smoking Status Former   Packs/day: 0.50   Years: 10.00   Pack years: 5.00   Types: Cigarettes   Quit date: 04/08/2015   Years since quitting: 5.4  Smokeless Tobacco Never    Labs: Recent Review Flowsheet Data     Labs for ITP Cardiac and Pulmonary Rehab Latest Ref Rng & Units 03/09/2019 12/27/2019 03/14/2020 07/25/2020 07/26/2020   Cholestrol 0 - 200 mg/dL - 137 125 - 207(H)    LDLCALC 0 - 99 mg/dL - 80 70 - 138(H)   HDL >40 mg/dL - 37(L) 39(L) - 41   Trlycerides <150 mg/dL - 105 83 - 141   Hemoglobin A1c 4.8 - 5.6 % 7.1(H) 7.2(A) 7.2(H) 7.4(H) -        Exercise Target Goals: Exercise Program Goal: Individual exercise prescription set using results from initial 6 min walk test and THRR while considering  patient's activity barriers and safety.   Exercise Prescription Goal: Initial exercise prescription builds to 30-45 minutes a day of aerobic activity, 2-3 days per week.  Home exercise guidelines will be given to patient during program as part of exercise prescription that the participant will acknowledge.   Education: Aerobic Exercise: - Group verbal and visual presentation on the components of exercise prescription. Introduces F.I.T.T principle from ACSM for exercise prescriptions.  Reviews F.I.T.T. principles of aerobic exercise including progression. Written material given at graduation.   Education: Resistance Exercise: - Group verbal and visual presentation on the components of exercise prescription. Introduces F.I.T.T principle from ACSM for exercise prescriptions  Reviews F.I.T.T. principles of resistance exercise including progression. Written material given at graduation.    Education: Exercise & Equipment Safety: - Individual verbal instruction and demonstration of equipment use and safety with use of the equipment. Flowsheet Row Cardiac Rehab from 08/19/2020 in [facility] Cardiac and Pulmonary Rehab  Date 08/19/20  Educator [facility]  Instruction Review Code 1- Verbalizes Understanding       Education: Exercise Physiology & General Exercise Guidelines: - Group verbal and written instruction with models to review the exercise physiology of the cardiovascular system and associated critical values. Provides general exercise guidelines with specific guidelines to those with heart or lung disease.    Education: Flexibility, Balance, Mind/Body Relaxation: -  Group verbal and visual presentation with interactive activity on the components of exercise prescription. Introduces F.I.T.T principle from ACSM for exercise prescriptions. Reviews F.I.T.T. principles of flexibility and balance exercise training including progression. Also discusses the mind body connection.  Reviews various relaxation techniques to help reduce and manage stress (i.e. Deep breathing, progressive muscle relaxation, and visualization). Balance handout provided to take home. Written material given at graduation.   Activity Barriers & Risk Stratification:  Activity Barriers & Cardiac Risk Stratification - 08/19/20 0934       Activity Barriers & Cardiac Risk Stratification   Activity Barriers Chest Pain/Angina;Muscular Weakness;Shortness of Breath;Deconditioning;Joint Problems   tweaked hip earlier this year, occasional pain   Cardiac Risk Stratification High             6 Minute Walk:  6 Minute Walk     Row Name 08/19/20 0932  6 Minute Walk   Phase Initial     Distance 1470 feet     Walk Time 6 minutes     # of Rest Breaks 0     MPH 2.78     METS 3.61     RPE 8     VO2 Peak 12.62     Symptoms No     Resting HR 64 bpm     Resting BP 126/66     Resting Oxygen Saturation  98 %     Exercise Oxygen Saturation  during 6 min walk 96 %     Max Ex. HR 79 bpm     Max Ex. BP 136/70              Oxygen Initial Assessment:   Oxygen Re-Evaluation:   Oxygen Discharge (Final Oxygen Re-Evaluation):   Initial Exercise Prescription:  Initial Exercise Prescription - 08/19/20 0900       Date of Initial Exercise RX and Referring Provider   Date 08/19/20    Referring Provider Kathlyn Sacramento MD   Primary Cardiologist: Dr. Ida Rogue     Treadmill   MPH 2.7    Grade 1    Minutes 15    METs 3.44      Recumbant Bike   Level 4    RPM 50    Watts 50    Minutes 15    METs 3.5      Arm Ergometer   Level 2    RPM 25    Minutes 15    METs 3       Recumbant Elliptical   Level 2    RPM 50    Minutes 15    METs 3      REL-XR   Level 3    Speed 50    Minutes 15    METs 3      Biostep-RELP   Level 4    SPM 50    Minutes 15    METs 3      Track   Laps 40    Minutes 15    METs 3.2      Prescription Details   Frequency (times per week) 3    Duration Progress to 30 minutes of continuous aerobic without signs/symptoms of physical distress      Intensity   THRR 40-80% of Max Heartrate 104-144    Ratings of Perceived Exertion 11-13    Perceived Dyspnea 0-4      Progression   Progression Continue to progress workloads to maintain intensity without signs/symptoms of physical distress.      Resistance Training   Training Prescription Yes    Weight 4 lb    Reps 10-15             Perform Capillary Blood Glucose checks as needed.  Exercise Prescription Changes:   Exercise Prescription Changes     Row Name 08/19/20 0900 09/04/20 0900           Response to Exercise   Blood Pressure (Admit) 126/66 110/60      Blood Pressure (Exercise) 136/70 150/58      Blood Pressure (Exit) 120/62 102/60      Heart Rate (Admit) 64 bpm 60 bpm      Heart Rate (Exercise) 79 bpm 117 bpm      Heart Rate (Exit) 66 bpm 82 bpm      Oxygen Saturation (Admit) 98 % --  Oxygen Saturation (Exercise) 96 % --      Rating of Perceived Exertion (Exercise) 8 12      Symptoms none none      Comments walk test results first full week of exercise      Duration -- Continue with 30 min of aerobic exercise without signs/symptoms of physical distress.      Intensity -- THRR unchanged             Progression      Progression -- Continue to progress workloads to maintain intensity without signs/symptoms of physical distress.      Average METs -- 5.34             Resistance Training      Training Prescription -- Yes      Weight -- 5 lb      Reps -- 10-15             Interval Training      Interval Training -- No              Treadmill      MPH -- 2.7      Grade -- 15      Minutes -- 15      METs -- 8.6             Recumbant Bike      Level -- 5      Watts -- 57      Minutes -- 15      METs -- 3.63              Exercise Comments:   Exercise Comments     Row Name 08/25/20 0739 08/25/20 0932         Exercise Comments DIscharged Ethel called to state he has a new job and will not be able to attend the program DIscharged Asael called to state he has a new job and will not be able to attend the program   ERROR   wrong patient                                                                      First full day of exercise!  Patient was oriented to gym and equipment including functions, settings, policies, and procedures.  Patient's individual exercise prescription and treatment plan were reviewed.  All starting workloads were established based on the results of the 6 minute walk test done at initial orientation visit.  The plan for exercise progression was also introduced and progression will be customized based on patient's performance and goals.               Exercise Goals and Review:   Exercise Goals     Row Name 08/19/20 0939             Exercise Goals   Increase Physical Activity Yes       Intervention Provide advice, education, support and counseling about physical activity/exercise needs.;Develop an individualized exercise prescription for aerobic and resistive training based on initial evaluation findings, risk stratification, comorbidities and participant's personal goals.       Expected Outcomes Short Term: Attend rehab on a regular basis to increase amount of physical activity.;Long Term: Add in  home exercise to make exercise part of routine and to increase amount of physical activity.;Long Term: Exercising regularly at least 3-5 days a week.       Increase Strength and Stamina Yes       Intervention Provide advice, education, support and counseling about physical activity/exercise  needs.;Develop an individualized exercise prescription for aerobic and resistive training based on initial evaluation findings, risk stratification, comorbidities and participant's personal goals.       Expected Outcomes Short Term: Increase workloads from initial exercise prescription for resistance, speed, and METs.;Long Term: Improve cardiorespiratory fitness, muscular endurance and strength as measured by increased METs and functional capacity (6MWT);Short Term: Perform resistance training exercises routinely during rehab and add in resistance training at home       Able to understand and use rate of perceived exertion (RPE) scale Yes       Intervention Provide education and explanation on how to use RPE scale       Expected Outcomes Short Term: Able to use RPE daily in rehab to express subjective intensity level;Long Term:  Able to use RPE to guide intensity level when exercising independently       Able to understand and use Dyspnea scale Yes       Intervention Provide education and explanation on how to use Dyspnea scale       Expected Outcomes Short Term: Able to use Dyspnea scale daily in rehab to express subjective sense of shortness of breath during exertion;Long Term: Able to use Dyspnea scale to guide intensity level when exercising independently       Knowledge and understanding of Target Heart Rate Range (THRR) Yes       Intervention Provide education and explanation of THRR including how the numbers were predicted and where they are located for reference       Expected Outcomes Short Term: Able to use daily as guideline for intensity in rehab;Short Term: Able to state/look up THRR;Long Term: Able to use THRR to govern intensity when exercising independently       Able to check pulse independently Yes       Intervention Provide education and demonstration on how to check pulse in carotid and radial arteries.;Review the importance of being able to check your own pulse for safety during  independent exercise       Expected Outcomes Short Term: Able to explain why pulse checking is important during independent exercise;Long Term: Able to check pulse independently and accurately       Understanding of Exercise Prescription Yes       Intervention Provide education, explanation, and written materials on patient's individual exercise prescription       Expected Outcomes Short Term: Able to explain program exercise prescription;Long Term: Able to explain home exercise prescription to exercise independently                Exercise Goals Re-Evaluation :  Exercise Goals Re-Evaluation     Row Name 08/25/20 0934 09/04/20 0953           Exercise Goal Re-Evaluation   Exercise Goals Review Able to understand and use rate of perceived exertion (RPE) scale;Knowledge and understanding of Target Heart Rate Range (THRR);Able to understand and use Dyspnea scale;Understanding of Exercise Prescription Increase Physical Activity;Increase Strength and Stamina      Comments Reviewed RPE and dyspnea scales, THR and program prescription with pt today.  Pt voiced understanding and was given a copy of goals to take home. Michel is  starting off well for his first couple weeks of exercise. He has already increased to 5 lb handweights and increased his incline on the Treadmill to 15%. Will Continue to monitor      Expected Outcomes Short: Use RPE daily to regulate intensity. Long: Follow program prescription in THR. Short: Continue to build up speed on treadmill Long: Increase overall MET level               Discharge Exercise Prescription (Final Exercise Prescription Changes):  Exercise Prescription Changes - 09/04/20 0900       Response to Exercise   Blood Pressure (Admit) 110/60    Blood Pressure (Exercise) 150/58    Blood Pressure (Exit) 102/60    Heart Rate (Admit) 60 bpm    Heart Rate (Exercise) 117 bpm    Heart Rate (Exit) 82 bpm    Rating of Perceived Exertion (Exercise) 12     Symptoms none    Comments first full week of exercise    Duration Continue with 30 min of aerobic exercise without signs/symptoms of physical distress.    Intensity THRR unchanged      Progression   Progression Continue to progress workloads to maintain intensity without signs/symptoms of physical distress.    Average METs 5.34      Resistance Training   Training Prescription Yes    Weight 5 lb    Reps 10-15      Interval Training   Interval Training No      Treadmill   MPH 2.7    Grade 15    Minutes 15    METs 8.6      Recumbant Bike   Level 5    Watts 57    Minutes 15    METs 3.63             Nutrition:  Target Goals: Understanding of nutrition guidelines, daily intake of sodium <1573m, cholesterol <2056m calories 30% from fat and 7% or less from saturated fats, daily to have 5 or more servings of fruits and vegetables.  Education: All About Nutrition: -Group instruction provided by verbal, written material, interactive activities, discussions, models, and posters to present general guidelines for heart healthy nutrition including fat, fiber, MyPlate, the role of sodium in heart healthy nutrition, utilization of the nutrition label, and utilization of this knowledge for meal planning. Follow up email sent as well. Written material given at graduation. [facility]  Education need identified 08/19/20       Biometrics:  Pre Biometrics - 08/19/20 0940       Pre Biometrics   Height 5' 10.2" (1.783 m)    Weight 218 lb (98.9 kg)    BMI (Calculated) 31.1    Single Leg Stand 30 seconds              Nutrition Therapy Plan and Nutrition Goals:  Nutrition Therapy & Goals - 08/19/20 0941       Intervention Plan   Intervention Prescribe, educate and counsel regarding individualized specific dietary modifications aiming towards targeted core components such as weight, hypertension, lipid  management, diabetes, heart failure and other comorbidities.    Expected Outcomes Short Term Goal: Understand basic principles of dietary content, such as calories, fat, sodium, cholesterol and nutrients.;Short Term Goal: A plan has been developed with personal nutrition goals set during dietitian appointment.;Long Term Goal: Adherence to prescribed nutrition plan.  Nutrition Assessments:  MEDIFICTS Score Key: ?70 Need to make dietary changes  40-70 Heart Healthy Diet ? 40 Therapeutic Level Cholesterol Diet  Flowsheet Row Cardiac Rehab from 08/19/2020 in [facility] Cardiac and Pulmonary Rehab  Picture Your Plate Total Score on Admission 67      Picture Your Plate Scores: <49 Unhealthy dietary pattern with much room for improvement. 41-50 Dietary pattern unlikely to meet recommendations for good health and room for improvement. 51-60 More healthful dietary pattern, with some room for improvement.  >60 Healthy dietary pattern, although there may be some specific behaviors that could be improved.    Nutrition Goals Re-Evaluation:   Nutrition Goals Discharge (Final Nutrition Goals Re-Evaluation):   Psychosocial: Target Goals: Acknowledge presence or absence of significant depression and/or stress, maximize coping skills, provide positive support system. Participant is able to verbalize types and ability to use techniques and skills needed for reducing stress and depression.   Education: Stress, Anxiety, and Depression - Group verbal and visual presentation to define topics covered.  Reviews how body is impacted by stress, anxiety, and depression.  Also discusses healthy ways to reduce stress and to treat/manage anxiety and depression.  Written material given at graduation.   Education: Sleep Hygiene -Provides group verbal and written instruction about how sleep can affect your health.  Define sleep hygiene, discuss sleep cycles and impact of sleep habits. Review good sleep  hygiene tips.    Initial Review & Psychosocial Screening:  Initial Psych Review & Screening - 08/11/20 1536       Initial Review   Current issues with Current Stress Concerns      Family Dynamics   Good Support System? Yes   wife, dad, son     Barriers   Psychosocial barriers to participate in program There are no identifiable barriers or psychosocial needs.;The patient should benefit from training in stress management and relaxation.      Screening Interventions   Interventions Encouraged to exercise;To provide support and resources with identified psychosocial needs;Provide feedback about the scores to participant    Expected Outcomes Short Term goal: Utilizing psychosocial counselor, staff and physician to assist with identification of specific Stressors or current issues interfering with healing process. Setting desired goal for each stressor or current issue identified.;Long Term Goal: Stressors or current issues are controlled or eliminated.;Short Term goal: Identification and review with participant of any Quality of Life or Depression concerns found by scoring the questionnaire.;Long Term goal: The participant improves quality of Life and PHQ9 Scores as seen by post scores and/or verbalization of changes             Quality of Life Scores:   Quality of Life - 08/19/20 0941       Quality of Life   Select Quality of Life      Quality of Life Scores   Health/Function Pre 23.87 %    Socioeconomic Pre 26 %    Psych/Spiritual Pre 26.64 %    Family Pre 27.6 %    GLOBAL Pre 25.44 %            Scores of 19 and below usually indicate a poorer quality of life in these areas.  A difference of  2-3 points is a clinically meaningful difference.  A difference of 2-3 points in the total score of the Quality of Life Index has been associated with significant improvement in overall quality of life, self-image, physical symptoms, and general health in studies assessing change in  quality of  life.  PHQ-9: Recent Review Flowsheet Data     Depression screen [facility] 2/9 08/19/2020 03/14/2020 03/09/2019 01/16/2018 01/14/2017   Decreased Interest 1 0 0 0 0   Down, Depressed, Hopeless 0 0 0 0 0   PHQ - 2 Score 1 0 0 0 0   Altered sleeping 0 - - - -   Tired, decreased energy 1 - - - -   Change in appetite 0 - - - -   Feeling bad or failure about yourself  0 - - - -   Trouble concentrating 0 - - - -   Moving slowly or fidgety/restless 0 - - - -   Suicidal thoughts 0 - - - -   PHQ-9 Score 2 - - - -   Difficult doing work/chores Not difficult at all - - - -      Interpretation of Total Score  Total Score Depression Severity:  1-4 = Minimal depression, 5-9 = Mild depression, 10-14 = Moderate depression, 15-19 = Moderately severe depression, 20-27 = Severe depression   Psychosocial Evaluation and Intervention:  Psychosocial Evaluation - 08/11/20 1551       Psychosocial Evaluation & Interventions   Interventions Encouraged to exercise with the program and follow exercise prescription;Stress management education;Relaxation education    Comments Matias reports doing okay after his NSTEMI. He is still struggling with feeling tired and low stamina, but is trying to slowly increase his activity level. 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 He does have twin 57 year olds at home which keep him quite active. He states his wife, older son, and dad are his main support system. He takes Lexapro at night to help him sleep because his mind races sometimes. At his appointment today, his cardiologist placed a holter monitor to evaluate for heart abnormalities since he has been experiencing palpitations and tiredness. He is very motivated to care for his body and is looking forward to coming to cardiac rehab.    Expected Outcomes Short: attend cardiac rehab for education and exercise. Long: develop and maintain positive self care habits.    Continue  Psychosocial Services  Follow up required by staff             Psychosocial Re-Evaluation:   Psychosocial Discharge (Final Psychosocial Re-Evaluation):   Vocational Rehabilitation: Provide vocational rehab assistance to qualifying candidates.   Vocational Rehab Evaluation & Intervention:  Vocational Rehab - 08/11/20 1537       Initial Vocational Rehab Evaluation & Intervention   Assessment shows need for Vocational Rehabilitation No             Education: Education Goals: Education classes will be provided on a variety of topics geared toward better understanding of heart health and risk factor modification. Participant will state understanding/return demonstration of topics presented as noted by education test scores.  Learning Barriers/Preferences:  Learning Barriers/Preferences - 08/11/20 1537       Learning Barriers/Preferences   Learning Barriers None    Learning Preferences None             General Cardiac Education Topics:  AED/CPR: - Group verbal and written instruction with the use of models to demonstrate the basic use of the AED with the basic ABC's of resuscitation.   Anatomy and Cardiac Procedures: - Group verbal and visual presentation and models provide information about basic cardiac anatomy and function. Reviews the testing methods done to diagnose heart disease  and the outcomes of the test results. Describes the treatment choices: Medical Management, Angioplasty, or Coronary Bypass Surgery for treating various heart conditions including Myocardial Infarction, Angina, Valve Disease, and Cardiac Arrhythmias.  Written material given at graduation.   Medication Safety: - Group verbal and visual instruction to review commonly prescribed medications for heart and lung disease. Reviews the medication, class of the drug, and side effects. Includes the steps to properly store meds and maintain the prescription regimen.  Written material given at  graduation.   Intimacy: - Group verbal instruction through game format to discuss how heart and lung disease can affect sexual intimacy. Written material given at graduation..   Know Your Numbers and Heart Failure: - Group verbal and visual instruction to discuss disease risk factors for cardiac and pulmonary disease and treatment options.  Reviews associated critical values for Overweight/Obesity, Hypertension, Cholesterol, and Diabetes.  Discusses basics of heart failure: signs/symptoms and treatments.  Introduces Heart Failure Zone chart for action plan for heart failure.  Written material given at graduation. Flowsheet Row Cardiac Rehab from 08/19/2020 in [facility] Cardiac and Pulmonary Rehab  Education need identified 08/19/20       Infection Prevention: - Provides verbal and written material to individual with discussion of infection control including proper hand washing and proper equipment cleaning during exercise session. Flowsheet Row Cardiac Rehab from 08/19/2020 in [facility] Cardiac and Pulmonary Rehab  Date 08/19/20  Educator [facility]  Instruction Review Code 1- Verbalizes Understanding       Falls Prevention: - Provides verbal and written material to individual with discussion of falls prevention and safety. Flowsheet Row Cardiac Rehab from 08/19/2020 in [facility] Cardiac and Pulmonary Rehab  Date 08/19/20  Educator [facility]  Instruction Review Code 1- Verbalizes Understanding       Other: -Provides group and verbal instruction on various topics (see comments)   Knowledge Questionnaire Score:  Knowledge Questionnaire Score - 08/19/20 0942       Knowledge Questionnaire Score   Pre Score 24/26 Education Focus: Nutrition, Risk factors             Core Components/Risk Factors/Patient Goals at Admission:  Personal Goals and Risk Factors at Admission - 08/19/20 0942       Core Components/Risk Factors/Patient Goals on Admission    Weight Management Yes;Obesity;Weight Loss     Intervention Weight Management: Develop a combined nutrition and exercise program designed to reach desired caloric intake, while maintaining appropriate intake of nutrient and fiber, sodium and fats, and appropriate energy expenditure required for the weight goal.;Weight Management: Provide education and appropriate resources to help participant work on and attain dietary goals.;Weight Management/Obesity: Establish reasonable short term and long term weight goals.;Obesity: Provide education and appropriate resources to help participant work on and attain dietary goals.    Admit Weight 218 lb (98.9 kg)    Goal Weight: Short Term 210 lb (95.3 kg)    Goal Weight: Long Term 200 lb (90.7 kg)    Expected Outcomes Short Term: Continue to assess and modify interventions until short term weight is achieved;Long Term: Adherence to nutrition and physical activity/exercise program aimed toward attainment of established weight goal;Weight Loss: Understanding of general recommendations for a balanced deficit meal plan, which promotes 1-2 lb weight loss per week and includes a negative energy balance of [phone] kcal/d;Understanding recommendations for meals to include 15-35% energy as protein, 25-35% energy from fat, 35-60% energy from carbohydrates, less than 223m of dietary cholesterol, 20-35 gm of total fiber daily;Understanding of distribution of  calorie intake throughout the day with the consumption of 4-5 meals/snacks    Diabetes Yes    Intervention Provide education about signs/symptoms and action to take for hypo/hyperglycemia.;Provide education about proper nutrition, including hydration, and aerobic/resistive exercise prescription along with prescribed medications to achieve blood glucose in normal ranges: Fasting glucose 65-99 mg/dL    Expected Outcomes Short Term: Participant verbalizes understanding of the signs/symptoms and immediate care of hyper/hypoglycemia, proper foot care and importance of medication,  aerobic/resistive exercise and nutrition plan for blood glucose control.;Long Term: Attainment of HbA1C < 7%.    Hypertension Yes    Intervention Provide education on lifestyle modifcations including regular physical activity/exercise, weight management, moderate sodium restriction and increased consumption of fresh fruit, vegetables, and low fat dairy, alcohol moderation, and smoking cessation.;Monitor prescription use compliance.    Expected Outcomes Short Term: Continued assessment and intervention until BP is < 140/30m HG in hypertensive participants. < 130/871mHG in hypertensive participants with diabetes, heart failure or chronic kidney disease.;Long Term: Maintenance of blood pressure at goal levels.    Lipids Yes    Intervention Provide education and support for participant on nutrition & aerobic/resistive exercise along with prescribed medications to achieve LDL <7091mHDL >63m47m  Expected Outcomes Short Term: Participant states understanding of desired cholesterol values and is compliant with medications prescribed. Participant is following exercise prescription and nutrition guidelines.;Long Term: Cholesterol controlled with medications as prescribed, with individualized exercise RX and with personalized nutrition plan. Value goals: LDL < 70mg68mL > 40 mg.             Education:Diabetes - Individual verbal and written instruction to review signs/symptoms of diabetes, desired ranges of glucose level fasting, after meals and with exercise. Acknowledge that pre and post exercise glucose checks will be done for 3 sessions at entry of program. [facility]  Date 08/11/20  [facility] 1- Verbalizes Understanding       Core Components/Risk Factors/Patient Goals Review:    Core Components/Risk Factors/Patient Goals at Discharge (Final Review):    ITP Comments:  ITP Comments     Row Name  08/11/20 1554 08/19/20 0931 08/25/20 0738 08/25/20 0931 09/10/20 0912   ITP Comments Initial telephone orientation completed. Diagnosis can be found in [facility]. EP orientation scheduled for Tuesday 6/21 at 8am. Completed 6MWT and gym orientation. Initial ITP created and sent for review to Dr. Mark Emily Filbertical Director. Error  Kenson Karemnot been diascharged.  Wrong Chart First full day of exercise!  Patient was oriented to gym and equipment including functions, settings, policies, and procedures.  Patient's individual exercise prescription and treatment plan were reviewed.  All starting workloads were established based on the results of the 6 minute walk test done at initial orientation visit.  The plan for exercise progression was also introduced and progression will be customized based on patient's performance and goals. 30 Day review completed. Medical Director ITP review done, changes made as directed, and signed approval by Medical Director.            Comments:

## 2020-09-11 ENCOUNTER — Other Ambulatory Visit: Payer: No Typology Code available for payment source

## 2020-09-12 ENCOUNTER — Ambulatory Visit: Payer: No Typology Code available for payment source

## 2020-09-15 ENCOUNTER — Ambulatory Visit: Payer: No Typology Code available for payment source

## 2020-09-15 ENCOUNTER — Other Ambulatory Visit: Payer: Self-pay

## 2020-09-15 ENCOUNTER — Other Ambulatory Visit
Admission: RE | Admit: 2020-09-15 | Discharge: 2020-09-15 | Disposition: A | Discharge status: Home / Self Care | Payer: No Typology Code available for payment source | Attending: Physician Assistant | Admitting: Physician Assistant

## 2020-09-15 ENCOUNTER — Encounter: Payer: No Typology Code available for payment source | Admitting: *Deleted

## 2020-09-15 DIAGNOSIS — E118 Type 2 diabetes mellitus with unspecified complications: Secondary | ICD-10-CM | POA: Insufficient documentation

## 2020-09-15 DIAGNOSIS — I25118 Atherosclerotic heart disease of native coronary artery with other forms of angina pectoris: Secondary | ICD-10-CM | POA: Diagnosis present

## 2020-09-15 DIAGNOSIS — T466X5A Adverse effect of antihyperlipidemic and antiarteriosclerotic drugs, initial encounter: Secondary | ICD-10-CM | POA: Insufficient documentation

## 2020-09-15 DIAGNOSIS — E039 Hypothyroidism, unspecified: Secondary | ICD-10-CM

## 2020-09-15 DIAGNOSIS — M609 Myositis, unspecified: Secondary | ICD-10-CM | POA: Insufficient documentation

## 2020-09-15 DIAGNOSIS — I252 Old myocardial infarction: Secondary | ICD-10-CM

## 2020-09-15 DIAGNOSIS — Z79899 Other long term (current) drug therapy: Secondary | ICD-10-CM | POA: Diagnosis present

## 2020-09-15 DIAGNOSIS — E785 Hyperlipidemia, unspecified: Secondary | ICD-10-CM

## 2020-09-15 DIAGNOSIS — R5383 Other fatigue: Secondary | ICD-10-CM

## 2020-09-15 DIAGNOSIS — Z951 Presence of aortocoronary bypass graft: Secondary | ICD-10-CM | POA: Diagnosis present

## 2020-09-15 DIAGNOSIS — I5020 Unspecified systolic (congestive) heart failure: Secondary | ICD-10-CM

## 2020-09-15 DIAGNOSIS — I214 Non-ST elevation (NSTEMI) myocardial infarction: Secondary | ICD-10-CM

## 2020-09-15 LAB — CBC
HCT: 43 % (ref 39.0–52.0)
Hemoglobin: 14.3 g/dL (ref 13.0–17.0)
MCH: 29.1 pg (ref 26.0–34.0)
MCHC: 33.3 g/dL (ref 30.0–36.0)
MCV: 87.4 fL (ref 80.0–100.0)
Platelets: 230 10*3/uL (ref 150–400)
RBC: 4.92 MIL/uL (ref 4.22–5.81)
RDW: 12.9 % (ref 11.5–15.5)
WBC: 5 10*3/uL (ref 4.0–10.5)
nRBC: 0 % (ref 0.0–0.2)

## 2020-09-15 LAB — COMPREHENSIVE METABOLIC PANEL
ALT: 19 U/L (ref 0–44)
AST: 22 U/L (ref 15–41)
Albumin: 4.1 g/dL (ref 3.5–5.0)
Alkaline Phosphatase: 29 U/L — ABNORMAL LOW (ref 38–126)
Anion gap: 6 (ref 5–15)
BUN: 21 mg/dL — ABNORMAL HIGH (ref 6–20)
CO2: 23 mmol/L (ref 22–32)
Calcium: 9 mg/dL (ref 8.9–10.3)
Chloride: 108 mmol/L (ref 98–111)
Creatinine, Ser: 1.11 mg/dL (ref 0.61–1.24)
GFR, Estimated: >60 mL/min (ref >60)
Glucose, Bld: 128 mg/dL — ABNORMAL HIGH (ref 70–99)
Potassium: 3.8 mmol/L (ref 3.5–5.1)
Sodium: 137 mmol/L (ref 135–145)
Total Bilirubin: 0.7 mg/dL (ref 0.3–1.2)
Total Protein: 6.9 g/dL (ref 6.5–8.1)

## 2020-09-15 LAB — LIPID PANEL
Cholesterol: 113 mg/dL (ref 0–200)
HDL: 33 mg/dL — ABNORMAL LOW (ref >40)
LDL Cholesterol: 67 mg/dL (ref 0–99)
Total CHOL/HDL Ratio: 3.4 RATIO
Triglycerides: 63 mg/dL (ref <150)
VLDL: 13 mg/dL (ref 0–40)

## 2020-09-15 LAB — HEMOGLOBIN A1C
Hgb A1c MFr Bld: 7.3 % — ABNORMAL HIGH (ref 4.8–5.6)
Mean Plasma Glucose: 162.81 mg/dL

## 2020-09-15 LAB — T4, FREE: Free T4: 1.08 ng/dL (ref 0.61–1.12)

## 2020-09-15 LAB — TSH: TSH: 4.817 u[IU]/mL — ABNORMAL HIGH (ref 0.350–4.500)

## 2020-09-15 NOTE — Progress Notes (Signed)
Daily Session Note  Patient Details  Name: MAEJOR ERVEN MRN: [id number] Date of Birth: 08-Nov-1965 Referring Provider:   Flowsheet Row Cardiac Rehab from 08/19/2020 in [facility] Cardiac and Pulmonary Rehab  Referring Provider Kathlyn Sacramento MD  [facility] Cardiologist: Dr. Ida Rogue       Encounter Date: 09/15/2020  Check In:  Session Check In - 09/15/20 1735       Check-In   Supervising physician immediately available to respond to emergencies See telemetry face sheet for immediately available ER MD    Location [facility]-Cardiac & Pulmonary Rehab    Staff Present Justin Mend, Sharren Bridge, MS, ASCM CEP, Exercise Physiologist;Melissa Tilford Pillar, RDN, LDN;Ylianna Almanzar, RN, BSN, CCRP    Virtual Visit No    Medication changes reported     No    Fall or balance concerns reported    No    Warm-up and Cool-down Performed on first and last piece of equipment    Resistance Training Performed Yes    VAD Patient? No    PAD/SET Patient? No      Pain Assessment   Currently in Pain? No/denies                Social History   Tobacco Use  Smoking Status Former   Packs/day: 0.50   Years: 10.00   Pack years: 5.00   Types: Cigarettes   Quit date: 04/08/2015   Years since quitting: 5.4  Smokeless Tobacco Never    Goals Met:  Independence with exercise equipment Exercise tolerated well No report of cardiac concerns or symptoms  Goals Unmet:  Not Applicable  Comments: Pt able to follow exercise prescription today without complaint.  Will continue to monitor for progression.    Dr. Emily Filbert is Medical Director for [place].  Dr. Ottie Glazier is Medical Director for [facility] Pulmonary Rehabilitation.

## 2020-09-16 LAB — LDL CHOLESTEROL, DIRECT: Direct LDL: 68.4 mg/dL (ref 0–99)

## 2020-09-17 ENCOUNTER — Ambulatory Visit: Payer: No Typology Code available for payment source

## 2020-09-17 ENCOUNTER — Other Ambulatory Visit: Payer: Self-pay

## 2020-09-17 DIAGNOSIS — I214 Non-ST elevation (NSTEMI) myocardial infarction: Secondary | ICD-10-CM

## 2020-09-17 DIAGNOSIS — Z5189 Encounter for other specified aftercare: Secondary | ICD-10-CM | POA: Diagnosis not present

## 2020-09-17 NOTE — Progress Notes (Signed)
Daily Session Note  Patient Details  Name: Todd Kelley MRN: [id number] Date of Birth: 11/02/1965 Referring Provider:   Flowsheet Row Cardiac Rehab from 08/19/2020 in [facility] Cardiac and Pulmonary Rehab  Referring Provider Arida, Muhammad MD  [Primary Cardiologist: Dr. Timothy Gollan]       Encounter Date: 09/17/2020  Check In:  Session Check In - 09/17/20 1717       Check-In   Supervising physician immediately available to respond to emergencies See telemetry face sheet for immediately available ER MD    Location [facility]-Cardiac & Pulmonary Rehab    Staff Present Kelly Bollinger, MPA, RN;Kara Langdon, MS, ASCM CEP, Exercise Physiologist;Joseph Hood, RCP,RRT,BSRT    Virtual Visit No    Medication changes reported     No    Fall or balance concerns reported    No    Tobacco Cessation No Change    Warm-up and Cool-down Performed on first and last piece of equipment    Resistance Training Performed Yes    VAD Patient? No    PAD/SET Patient? No      Pain Assessment   Currently in Pain? No/denies                Social History   Tobacco Use  Smoking Status Former   Packs/day: 0.50   Years: 10.00   Pack years: 5.00   Types: Cigarettes   Quit date: 04/08/2015   Years since quitting: 5.4  Smokeless Tobacco Never    Goals Met:  Independence with exercise equipment Exercise tolerated well No report of cardiac concerns or symptoms Strength training completed today  Goals Unmet:  Not Applicable  Comments: Pt able to follow exercise prescription today without complaint.  Will continue to monitor for progression.    Dr. Mark Miller is Medical Director for [facility].  Dr. Fuad Aleskerov is Medical Director for [facility] Pulmonary Rehabilitation. 

## 2020-09-18 ENCOUNTER — Telehealth: Payer: Self-pay | Admitting: *Deleted

## 2020-09-18 DIAGNOSIS — Z5189 Encounter for other specified aftercare: Secondary | ICD-10-CM | POA: Diagnosis not present

## 2020-09-18 NOTE — Telephone Encounter (Signed)
-----   Message from Arvil Chaco, PA-C sent at 09/17/2020  8:35 PM EDT ----- Labs collected with intention to route to PCP.  --Renal function stable on current medications.  --Potassium slightly below goal at 3.8 (see recommendations below). --LFTs wnl. LDL at goal of below 70. Continue statin and Zetia. --A1C elevated - sending to PCP.  --TSH elevated with FT4 wnl, which is nonspecific - sending to PCP.   Recommendations- --Will forward labs to PCP = A1C and TSH. --Will forward labs to Dr. Rockey Situ. Based on potassium and room BP in cardiac rehab, consider spironolactone at next OV +/- increase back to previous Entresto dose.

## 2020-09-18 NOTE — Telephone Encounter (Signed)
Left voicemail message to call back for review of results.  

## 2020-09-19 ENCOUNTER — Telehealth: Payer: Self-pay | Admitting: Medical

## 2020-09-19 ENCOUNTER — Ambulatory Visit: Payer: No Typology Code available for payment source

## 2020-09-19 NOTE — Telephone Encounter (Signed)
Got pt scheduled 

## 2020-09-19 NOTE — Telephone Encounter (Signed)
Unable to reach pt. Left him a vm to call the office and schedule an appt with [place]

## 2020-09-19 NOTE — Telephone Encounter (Signed)
Patient returning call.

## 2020-09-19 NOTE — Telephone Encounter (Signed)
-----   Message from Carlena Hurl, PA-C sent at 09/18/2020  3:42 PM EDT ----- Vita Barley, get him for 2modiabetes f/u, fasting  Thanks for the lab info from cardiology, Dr. GRockey Situand Mrs. VMickle Plumb PUtah SAudelia Acton ----- Message ----- From: VArvil Chaco PA-C Sent: 09/17/2020   8:35 PM EDT To: TMinna Merritts MD, DCarlena Hurl PA-C, #  Labs collected with intention to route to PCP.  --Renal function stable on current medications.  --Potassium slightly below goal at 3.8 (see recommendations below). --LFTs wnl. LDL at goal of below 70. Continue statin and Zetia. --A1C elevated - sending to PCP.  --TSH elevated with FT4 wnl, which is nonspecific - sending to PCP.   Recommendations- --Will forward labs to PCP = A1C and TSH. --Will forward labs to Dr. GRockey Situ Based on potassium and room BP in cardiac rehab, consider spironolactone at next OV +/- increase back to previous Entresto dose.

## 2020-09-19 NOTE — Telephone Encounter (Signed)
The patient has been notified of the result and verbalized understanding.  All questions (if any) were answered. Pt will follow up with Dr. Rockey Situ 09/22/20.

## 2020-09-22 ENCOUNTER — Ambulatory Visit: Payer: No Typology Code available for payment source

## 2020-09-22 ENCOUNTER — Ambulatory Visit (INDEPENDENT_AMBULATORY_CARE_PROVIDER_SITE_OTHER): Payer: No Typology Code available for payment source | Admitting: Cardiovascular Disease

## 2020-09-22 ENCOUNTER — Encounter: Payer: Self-pay | Admitting: Cardiovascular Disease

## 2020-09-22 ENCOUNTER — Other Ambulatory Visit: Payer: Self-pay

## 2020-09-22 ENCOUNTER — Encounter: Payer: No Typology Code available for payment source | Admitting: *Deleted

## 2020-09-22 VITALS — BP 116/66 | HR 58 | Ht 69.5 in | Wt 213.0 lb

## 2020-09-22 DIAGNOSIS — I10 Essential (primary) hypertension: Secondary | ICD-10-CM

## 2020-09-22 DIAGNOSIS — I25118 Atherosclerotic heart disease of native coronary artery with other forms of angina pectoris: Secondary | ICD-10-CM | POA: Diagnosis not present

## 2020-09-22 DIAGNOSIS — I214 Non-ST elevation (NSTEMI) myocardial infarction: Secondary | ICD-10-CM

## 2020-09-22 DIAGNOSIS — Z5189 Encounter for other specified aftercare: Secondary | ICD-10-CM | POA: Diagnosis not present

## 2020-09-22 DIAGNOSIS — E118 Type 2 diabetes mellitus with unspecified complications: Secondary | ICD-10-CM | POA: Diagnosis not present

## 2020-09-22 DIAGNOSIS — E785 Hyperlipidemia, unspecified: Secondary | ICD-10-CM

## 2020-09-22 MED ORDER — POTASSIUM CHLORIDE ER 10 MEQ PO TBCR: 10.0000 meq | EXTENDED_RELEASE_TABLET | Freq: Every day | ORAL | 3 refills | Status: DC

## 2020-09-22 MED ORDER — TORSEMIDE 20 MG PO TABS: ORAL_TABLET | ORAL | 1 refills | Status: DC

## 2020-09-22 NOTE — Progress Notes (Signed)
Cardiology Office Note  Date:  09/22/2020   ID:  Avontay, Kreinbrink 08/28/1965, MRN [id number]  PCP:  Carlena Hurl, PA-C   Chief Complaint  Patient presents with   Other    Follow up post Zio. Meds reviewed verbally with patient.     HPI:  Mr. Frizell is a 55 year old gentleman with  Coronary artery disease  lateral MI in 2000,  CABG times 01 February 2001,  poor cardiology follow-up, medication noncompliance,  obesity, presenting to the hospital May 2022 with unstable angina/non-STEMI Who presents to the office for follow-up of his coronary disease  Recent events reviewed with him On arrival to the hospital Jul 26, 2020 he reported appreciating anginal symptoms over the past several weeks that has been progressive Initial troponin 168 up to 600 followed by 19,000  On arrival to the hospital, EKG normal sinus rhythm ST depressions, 1 and aVL, this had resolved in follow-up EKG  Cardiac catheterization Jul 28, 2020 1.  Severe underlying three-vessel coronary artery disease with patent grafts including LIMA to LAD, RIMA to ramus and SVG to right PDA.  There is significant mid to distal LAD stenosis proximal to the LIMA anastomosis, significant ostial left circumflex disease but the left circumflex distribution is relatively small.  There is moderate proximal disease affecting the SVG to right PDA. 2.  Moderately reduced LV systolic function with an EF of 35 to 40% with global hypokinesis. 3.  Normal left ventricular end-diastolic pressure.   Recommendations: No clear culprit is identified for the patient's non-ST elevation myocardial infarction.  Vessel occlusion with spontaneous recanalization is a possibility especially in the SVG to right PDA.  It was recommended Plavix for at least 1 year  Echocardiogram 10/28/2020  1. Left ventricular ejection fraction, by estimation, is 35 to 40%. The  left ventricle has moderately decreased function. The left ventricle  demonstrates  regional wall motion abnormalities (Hypokinesis of the  anterior and anteroseptal wall). There is  moderate left ventricular hypertrophy. Left ventricular diastolic  parameters are consistent with Grade I diastolic dysfunction (impaired  relaxation).   2. Right ventricular systolic function is normal. The right ventricular  size is normal.   3. Left atrial size was mildly dilated.   4. Frequent PVCs   Total cholesterol arrival to the hospital 207 on Jul 26, 2020 Repeat total cholesterol September 15, 2020 down to 113 A1C 7.3  ZIO monitor Normal sinus rhythm 12 Ventricular Tachycardia runs occurred, the run with the fastest interval lasting 17 beats with a max rate of 176 bpm (avg 154 bpm); the run with the fastest interval was also the longest.  Idioventricular Rhythm was present. Junctional Rhythm was present.  Isolated SVEs were rare (<1.0%, 883), SVE Couplets were rare (<1.0%, 44), and no SVE Triplets were present. Isolated VEs were occasional (4.9%, [id number]), VE Couplets were occasional (1.5%, 6109), and VE Triplets were rare (<1.0%, 2221). Ventricular Bigeminy and Trigeminy were present.  No BB given h/o junctional bradycardia.  Had episodes of tachycardia coming out of hospital, Better now  Goes to rehab, walks in plant at work   PMH:   has a past medical history of Acid reflux, Allergic asthma, Coronary artery disease, DVT (deep venous thrombosis) ([place]), Erectile dysfunction, Hyperlipidemia LDL goal <70, Hypertension, Hypothyroidism, Palpitations, Pericardial effusion, Tobacco use, and Type II diabetes mellitus ([place]).  PSH:    Past Surgical History:  Procedure Laterality Date   APPENDECTOMY  2001   CARDIAC CATHETERIZATION  2000   stent  COLONOSCOPY WITH PROPOFOL N/A 03/17/2018   Procedure: COLONOSCOPY WITH PROPOFOL;  Surgeon: Lucilla Lame, MD;  Location: [facility];  Service: Endoscopy;  Laterality: N/A;   CORONARY ANGIOPLASTY  jan. 2001   CORONARY ANGIOPLASTY WITH  STENT PLACEMENT  june 2001   stent   CORONARY ARTERY BYPASS GRAFT  dec. 2002   LIMA graft to LAD,RIMA graft to the intermediate branch and saphenous vein graft  to the posterior lateral and posterior descending branches of the right coronary   LEFT HEART CATH AND CORS/GRAFTS ANGIOGRAPHY N/A 07/28/2020   Procedure: LEFT HEART CATH AND CORS/GRAFTS ANGIOGRAPHY;  Surgeon: Wellington Hampshire, MD;  Location: [facility];  Service: Cardiovascular;  Laterality: N/A;   POLYPECTOMY  03/17/2018   Procedure: POLYPECTOMY;  Surgeon: Lucilla Lame, MD;  Location: [facility];  Service: Endoscopy;;    Current Outpatient Medications  Medication Sig Dispense Refill   aspirin (ASPIRIN LOW DOSE) 81 MG EC tablet Take 1 tablet (81 mg total) by mouth daily. Swallow whole. 90 tablet 3   atorvastatin (LIPITOR) 10 MG tablet Take 1 tablet (10 mg total) by mouth daily. 30 tablet 11   cetirizine (ZYRTEC) 10 MG tablet Take 10 mg by mouth daily.     clopidogrel (PLAVIX) 75 MG tablet Take 1 tablet (75 mg total) by mouth daily with breakfast. 30 tablet 11   Continuous Blood Gluc Receiver (DEXCOM G6 RECEIVER) DEVI Use as instructed 1 each 0   Continuous Blood Gluc Sensor (DEXCOM G6 SENSOR) MISC Use for every 14 days. 3 each 2   Continuous Blood Gluc Transmit (DEXCOM G6 TRANSMITTER) MISC Use as instructed 1 each 0   empagliflozin (JARDIANCE) 10 MG TABS tablet Take 1 tablet (10 mg total) by mouth daily before breakfast. 30 tablet 2   escitalopram (LEXAPRO) 10 MG tablet Take 1 tablet (10 mg total) by mouth daily. 90 tablet 3   ezetimibe (ZETIA) 10 MG tablet Take 1 tablet (10 mg total) by mouth daily. 30 tablet 11   fenofibrate (TRICOR) 145 MG tablet TAKE 1 TABLET BY MOUTH  DAILY 90 tablet 0   levothyroxine (SYNTHROID) 150 MCG tablet Take 1 tablet (150 mcg total) by mouth daily before breakfast. 90 tablet 1   metFORMIN (GLUCOPHAGE XR) 500 MG 24 hr tablet Take 1 tablet (500 mg total) by mouth daily with breakfast. 30  tablet 11   Multiple Vitamins-Minerals (MULTIVITAMIN ADULT) CHEW Chew 1 tablet by mouth daily.     nitroGLYCERIN (NITROSTAT) 0.4 MG SL tablet Place 1 tablet (0.4 mg total) under the tongue every 5 (five) minutes as needed for chest pain. 25 tablet 4   PROAIR RESPICLICK 123XX123 (90 Base) MCG/ACT AEPB USE 2 INHALATIONS BY MOUTH  EVERY 6 HOURS AS NEEDED 1 each 0   sacubitril-valsartan (ENTRESTO) 24-26 MG Take 1 tablet by mouth 2 (two) times daily. 60 tablet 11   torsemide (DEMADEX) 10 MG tablet Take 1 tablet (10 mg total) by mouth daily. 30 tablet 11   No current facility-administered medications for this visit.     Allergies:   Farxiga [dapagliflozin], Statins, and Tetracycline   Social History:  The patient  reports that he quit smoking about 5 years ago. His smoking use included cigarettes. He has a 5.00 pack-year smoking history. He has never used smokeless tobacco. He reports current alcohol use of about 5.0 standard drinks of alcohol per week. He reports that he does not use drugs.   Family History:   family history includes Asthma in  his brother; Cancer in his mother and paternal grandfather; Cancer (age of onset: 92) in his brother; Diabetes in his paternal grandmother; Heart disease in his father and sister; Hypertension in his father; Liver cancer in his mother; Liver disease in his brother; Multiple sclerosis in his sister; Suicidality in his brother; Thyroid disease in his brother; Valvular heart disease in his father.    Review of Systems: Review of Systems  Constitutional: Negative.   HENT: Negative.    Respiratory: Negative.    Cardiovascular: Negative.   Gastrointestinal: Negative.   Musculoskeletal: Negative.   Neurological: Negative.   Psychiatric/Behavioral: Negative.    All other systems reviewed and are negative.   PHYSICAL EXAM: VS:  BP 116/66 (BP Location: Left Arm, Patient Position: Sitting, Cuff Size: Normal)   Pulse (!) 58   Ht 5' 9.5" (1.765 m)   Wt 213 lb (96.6  kg)   SpO2 96%   BMI 31.00 kg/m  , BMI Body mass index is 31 kg/m. GEN: Well nourished, well developed, in no acute distress HEENT: normal Neck: no JVD, carotid bruits, or masses Cardiac: RRR; no murmurs, rubs, or gallops,no edema  Respiratory:  clear to auscultation bilaterally, normal work of breathing GI: soft, nontender, nondistended, + BS MS: no deformity or atrophy Skin: warm and dry, no rash Neuro:  Strength and sensation are intact Psych: euthymic mood, full affect   Recent Labs: 07/27/2020: Magnesium 1.9 09/15/2020: ALT 19; BUN 21; Creatinine, Ser 1.11; Hemoglobin 14.3; Platelets 230; Potassium 3.8; Sodium 137; TSH 4.817    Lipid Panel Lab Results  Component Value Date   CHOL 113 09/15/2020   HDL 33 (L) 09/15/2020   LDLCALC 67 09/15/2020   TRIG 63 09/15/2020      Wt Readings from Last 3 Encounters:  09/22/20 213 lb (96.6 kg)  08/29/20 215 lb (97.5 kg)  08/19/20 218 lb (98.9 kg)       ASSESSMENT AND PLAN:  Problem List Items Addressed This Visit       Cardiology Problems   Coronary artery disease - Primary   Essential hypertension     Other   Controlled type 2 diabetes mellitus with complication, without long-term current use of insulin (HCC)   Dyslipidemia   CAD with stable angina Recent catheterization for non-STEMI Grafts patent, severe underlying three-vessel disease Denies having anginal symptoms at this time Recommended nitro as needed for any breakthrough angina  Diabetes type 2 A1c >7 Goal to 6 Discussed diet, exercise program, lifestyle modification  Hyperlipidemia Cholesterol at goal, no changes made  HTN: Blood pressure is well controlled on today's visit. No changes made to the medications.  Cardiomyopathy Discussed current medications, prefers no changes at this time Not on beta-blocker secondary to history of junctional bradycardia Torsemide 20 daily extra torsemide in the afternoon as needed for abdominal distention or leg  swelling   Total encounter time more than 35 minutes  Greater than 50% was spent in counseling and coordination of care with the patient    Signed, Esmond Plants, M.D., Ph.D. [place], [place]

## 2020-09-22 NOTE — Patient Instructions (Addendum)
Medication Instructions:  Your physician has recommended you make the following change in your medication:   1) Torsemide 20 mg- take 1 tablet (20 mg) by mouth once daily, you may also take an extra 1 tablet (20 mg) in the afternoon for abdominal swelling  If you need a refill on your cardiac medications before your next appointment, please call your pharmacy.    Lab work: No new labs needed   If you have labs (blood work) drawn today and your tests are completely normal, you will receive your results only by: [place] (if you have MyChart) OR A paper copy in the mail If you have any lab test that is abnormal or we need to change your treatment, we will call you to review the results.   Testing/Procedures: No new testing needed   Follow-Up: At [facility], you and your health needs are our priority.  As part of our continuing mission to provide you with exceptional heart care, we have created designated Provider Care Teams.  These Care Teams include your primary Cardiologist (physician) and Advanced Practice Providers (APPs -  Physician Assistants and Nurse Practitioners) who all work together to provide you with the care you need, when you need it.  You will need a follow up appointment in 6 months  Providers on your designated Care Team:   Murray Hodgkins, NP Christell Faith, PA-C Marrianne Mood, PA-C Cadence Kathlen Mody, [place]  Any Other Special Instructions Will Be Listed Below (If Applicable).  COVID-19 Vaccine Information can be found at: [email or web address].uk For questions related to vaccine distribution or appointments, please email [email or web address] or call 9258435845.

## 2020-09-22 NOTE — Progress Notes (Signed)
Daily Session Note  Patient Details  Name: Todd Kelley MRN: [id number] Date of Birth: February 14, 1966 Referring Provider:   Flowsheet Row Cardiac Rehab from 08/19/2020 in [facility] Cardiac and Pulmonary Rehab  Referring Provider Kathlyn Sacramento MD  [facility] Cardiologist: Dr. Ida Rogue       Encounter Date: 09/22/2020  Check In:  Session Check In - 09/22/20 1720       Check-In   Supervising physician immediately available to respond to emergencies See telemetry face sheet for immediately available ER MD    Location [facility]-Cardiac & Pulmonary Rehab    Staff Present Renita Papa, RN BSN;Joseph [place], RCP,RRT,BSRT;Kara [place], MS, ASCM CEP, Exercise Physiologist    Virtual Visit No    Medication changes reported     No    Fall or balance concerns reported    No    Warm-up and Cool-down Performed on first and last piece of equipment    Resistance Training Performed Yes    VAD Patient? No    PAD/SET Patient? No      Pain Assessment   Currently in Pain? No/denies                Social History   Tobacco Use  Smoking Status Former   Packs/day: 0.50   Years: 10.00   Pack years: 5.00   Types: Cigarettes   Quit date: 04/08/2015   Years since quitting: 5.4  Smokeless Tobacco Never    Goals Met:  Independence with exercise equipment Exercise tolerated well No report of cardiac concerns or symptoms Strength training completed today  Goals Unmet:  Not Applicable  Comments: Pt able to follow exercise prescription today without complaint.  Will continue to monitor for progression.    Dr. Emily Filbert is Medical Director for [place].  Dr. Ottie Glazier is Medical Director for [facility] Pulmonary Rehabilitation.

## 2020-09-24 ENCOUNTER — Ambulatory Visit: Payer: No Typology Code available for payment source

## 2020-09-24 ENCOUNTER — Encounter: Payer: No Typology Code available for payment source | Admitting: *Deleted

## 2020-09-24 ENCOUNTER — Other Ambulatory Visit: Payer: Self-pay

## 2020-09-24 DIAGNOSIS — Z5189 Encounter for other specified aftercare: Secondary | ICD-10-CM | POA: Diagnosis not present

## 2020-09-24 DIAGNOSIS — I214 Non-ST elevation (NSTEMI) myocardial infarction: Secondary | ICD-10-CM

## 2020-09-24 NOTE — Progress Notes (Signed)
Daily Session Note  Patient Details  Name: Todd Kelley MRN: [id number] Date of Birth: 1965-05-11 Referring Provider:   Flowsheet Row Cardiac Rehab from 08/19/2020 in [facility] Cardiac and Pulmonary Rehab  Referring Provider Kathlyn Sacramento MD  [facility] Cardiologist: Dr. Ida Rogue       Encounter Date: 09/24/2020  Check In:  Session Check In - 09/24/20 1717       Check-In   Supervising physician immediately available to respond to emergencies See telemetry face sheet for immediately available ER MD    Location [facility]-Cardiac & Pulmonary Rehab    Staff Present Renita Papa, RN BSN;Joseph [place], RCP,RRT,BSRT;Kara [place], MS, ASCM CEP, Exercise Physiologist    Virtual Visit No    Medication changes reported     No    Fall or balance concerns reported    No    Warm-up and Cool-down Performed on first and last piece of equipment    Resistance Training Performed Yes    VAD Patient? No    PAD/SET Patient? No      Pain Assessment   Currently in Pain? No/denies                Social History   Tobacco Use  Smoking Status Former   Packs/day: 0.50   Years: 10.00   Pack years: 5.00   Types: Cigarettes   Quit date: 04/08/2015   Years since quitting: 5.4  Smokeless Tobacco Never    Goals Met:  Independence with exercise equipment Exercise tolerated well No report of cardiac concerns or symptoms Strength training completed today  Goals Unmet:  Not Applicable  Comments: Pt able to follow exercise prescription today without complaint.  Will continue to monitor for progression.    Dr. Emily Filbert is Medical Director for [place].  Dr. Ottie Glazier is Medical Director for [facility] Pulmonary Rehabilitation.

## 2020-09-25 ENCOUNTER — Encounter: Payer: No Typology Code available for payment source | Admitting: *Deleted

## 2020-09-25 DIAGNOSIS — Z5189 Encounter for other specified aftercare: Secondary | ICD-10-CM | POA: Diagnosis not present

## 2020-09-25 DIAGNOSIS — I214 Non-ST elevation (NSTEMI) myocardial infarction: Secondary | ICD-10-CM

## 2020-09-25 NOTE — Progress Notes (Signed)
Daily Session Note  Patient Details  Name: Todd Kelley MRN: [id number] Date of Birth: 11-13-1965 Referring Provider:   Flowsheet Row Cardiac Rehab from 08/19/2020 in [facility] Cardiac and Pulmonary Rehab  Referring Provider Kathlyn Sacramento MD  [facility] Cardiologist: Dr. Ida Rogue       Encounter Date: 09/25/2020  Check In:  Session Check In - 09/25/20 1717       Check-In   Supervising physician immediately available to respond to emergencies See telemetry face sheet for immediately available ER MD    Location [facility]-Cardiac & Pulmonary Rehab    Staff Present Renita Papa, RN BSN;Joseph [place], RCP,RRT,BSRT;Kara [place], MS, ASCM CEP, Exercise Physiologist    Virtual Visit No    Medication changes reported     No    Fall or balance concerns reported    No    Warm-up and Cool-down Performed on first and last piece of equipment    Resistance Training Performed Yes    VAD Patient? No    PAD/SET Patient? No      Pain Assessment   Currently in Pain? No/denies                Social History   Tobacco Use  Smoking Status Former   Packs/day: 0.50   Years: 10.00   Pack years: 5.00   Types: Cigarettes   Quit date: 04/08/2015   Years since quitting: 5.4  Smokeless Tobacco Never    Goals Met:  Independence with exercise equipment Exercise tolerated well No report of cardiac concerns or symptoms Strength training completed today  Goals Unmet:  Not Applicable  Comments: Pt able to follow exercise prescription today without complaint.  Will continue to monitor for progression.    Dr. Emily Filbert is Medical Director for [place].  Dr. Ottie Glazier is Medical Director for [facility] Pulmonary Rehabilitation.

## 2020-09-26 ENCOUNTER — Ambulatory Visit: Payer: No Typology Code available for payment source

## 2020-09-29 ENCOUNTER — Ambulatory Visit: Payer: No Typology Code available for payment source

## 2020-09-29 ENCOUNTER — Other Ambulatory Visit: Payer: Self-pay

## 2020-09-29 ENCOUNTER — Encounter: Payer: No Typology Code available for payment source | Attending: Cardiovascular Disease | Admitting: *Deleted

## 2020-09-29 DIAGNOSIS — I252 Old myocardial infarction: Secondary | ICD-10-CM | POA: Diagnosis present

## 2020-09-29 DIAGNOSIS — I214 Non-ST elevation (NSTEMI) myocardial infarction: Secondary | ICD-10-CM

## 2020-09-29 NOTE — Progress Notes (Signed)
Daily Session Note  Patient Details  Name: Todd Kelley MRN: [id number] Date of Birth: 1965/07/06 Referring Provider:   Flowsheet Row Cardiac Rehab from 08/19/2020 in [facility] Cardiac and Pulmonary Rehab  Referring Provider Kathlyn Sacramento MD  [facility] Cardiologist: Dr. Ida Rogue       Encounter Date: 09/29/2020  Check In:  Session Check In - 09/29/20 1717       Check-In   Supervising physician immediately available to respond to emergencies See telemetry face sheet for immediately available ER MD    Location [facility]-Cardiac & Pulmonary Rehab    Staff Present Renita Papa, RN BSN;Joseph [place], RCP,RRT,BSRT;Kara [place], MS, ASCM CEP, Exercise Physiologist    Virtual Visit No    Medication changes reported     No    Fall or balance concerns reported    No    Warm-up and Cool-down Performed on first and last piece of equipment    Resistance Training Performed Yes    VAD Patient? No    PAD/SET Patient? No      Pain Assessment   Currently in Pain? No/denies                Social History   Tobacco Use  Smoking Status Former   Packs/day: 0.50   Years: 10.00   Pack years: 5.00   Types: Cigarettes   Quit date: 04/08/2015   Years since quitting: 5.4  Smokeless Tobacco Never    Goals Met:  Independence with exercise equipment Exercise tolerated well No report of cardiac concerns or symptoms Strength training completed today  Goals Unmet:  Not Applicable  Comments: Pt able to follow exercise prescription today without complaint.  Will continue to monitor for progression.    Dr. Emily Filbert is Medical Director for [place].  Dr. Ottie Glazier is Medical Director for [facility] Pulmonary Rehabilitation.

## 2020-10-01 ENCOUNTER — Other Ambulatory Visit: Payer: Self-pay

## 2020-10-01 ENCOUNTER — Ambulatory Visit: Payer: No Typology Code available for payment source

## 2020-10-01 ENCOUNTER — Encounter: Payer: No Typology Code available for payment source | Admitting: *Deleted

## 2020-10-01 DIAGNOSIS — I252 Old myocardial infarction: Secondary | ICD-10-CM | POA: Diagnosis not present

## 2020-10-01 DIAGNOSIS — I214 Non-ST elevation (NSTEMI) myocardial infarction: Secondary | ICD-10-CM

## 2020-10-01 NOTE — Progress Notes (Signed)
Daily Session Note  Patient Details  Name: Todd Kelley MRN: [id number] Date of Birth: [birth year] Referring Provider:   Flowsheet Row Cardiac Rehab from 08/19/2020 in [facility] Cardiac and Pulmonary Rehab  Referring Provider Kathlyn Sacramento MD  [facility] Cardiologist: Dr. Ida Rogue       Encounter Date: 10/01/2020  Check In:  Session Check In - 10/01/20 1714       Check-In   Supervising physician immediately available to respond to emergencies See telemetry face sheet for immediately available ER MD    Location [facility]-Cardiac & Pulmonary Rehab    Staff Present Renita Papa, RN Margurite Auerbach, MS, ASCM CEP, Exercise Physiologist;Melissa Caiola, RDN, LDN    Virtual Visit No    Medication changes reported     No    Fall or balance concerns reported    No    Warm-up and Cool-down Performed on first and last piece of equipment    Resistance Training Performed Yes    VAD Patient? No    PAD/SET Patient? No      Pain Assessment   Currently in Pain? No/denies                Social History   Tobacco Use  Smoking Status Former   Packs/day: 0.50   Years: 10.00   Pack years: 5.00   Types: Cigarettes   Quit date: 04/08/2015   Years since quitting: 5.4  Smokeless Tobacco Never    Goals Met:  Independence with exercise equipment Exercise tolerated well No report of cardiac concerns or symptoms Strength training completed today  Goals Unmet:  Not Applicable  Comments: Pt able to follow exercise prescription today without complaint.  Will continue to monitor for progression.    Dr. Emily Filbert is Medical Director for [place].  Dr. Ottie Glazier is Medical Director for [facility] Pulmonary Rehabilitation.

## 2020-10-02 ENCOUNTER — Other Ambulatory Visit: Payer: Self-pay | Admitting: Medical

## 2020-10-02 ENCOUNTER — Encounter: Payer: No Typology Code available for payment source | Admitting: *Deleted

## 2020-10-02 DIAGNOSIS — I252 Old myocardial infarction: Secondary | ICD-10-CM | POA: Diagnosis not present

## 2020-10-02 DIAGNOSIS — I214 Non-ST elevation (NSTEMI) myocardial infarction: Secondary | ICD-10-CM

## 2020-10-02 NOTE — Progress Notes (Signed)
Daily Session Note  Patient Details  Name: Todd Kelley MRN: [id number] Date of Birth: January 22, 1966 Referring Provider:   Flowsheet Row Cardiac Rehab from 08/19/2020 in [facility] Cardiac and Pulmonary Rehab  Referring Provider Kathlyn Sacramento MD  [facility] Cardiologist: Dr. Ida Rogue       Encounter Date: 10/02/2020  Check In:  Session Check In - 10/02/20 1726       Check-In   Supervising physician immediately available to respond to emergencies See telemetry face sheet for immediately available ER MD    Location [facility]-Cardiac & Pulmonary Rehab    Staff Present Renita Papa, RN BSN;Joseph [place], RCP,RRT,BSRT;Kara [place], MS, ASCM CEP, Exercise Physiologist    Virtual Visit No    Medication changes reported     No    Fall or balance concerns reported    No    Warm-up and Cool-down Performed on first and last piece of equipment    Resistance Training Performed Yes    VAD Patient? No    PAD/SET Patient? No      Pain Assessment   Currently in Pain? No/denies                Social History   Tobacco Use  Smoking Status Former   Packs/day: 0.50   Years: 10.00   Pack years: 5.00   Types: Cigarettes   Quit date: 04/08/2015   Years since quitting: 5.4  Smokeless Tobacco Never    Goals Met:  Independence with exercise equipment Exercise tolerated well No report of cardiac concerns or symptoms Strength training completed today  Goals Unmet:  Not Applicable  Comments: Pt able to follow exercise prescription today without complaint.  Will continue to monitor for progression.    Dr. Emily Filbert is Medical Director for [place].  Dr. Ottie Glazier is Medical Director for [facility] Pulmonary Rehabilitation.

## 2020-10-03 ENCOUNTER — Ambulatory Visit: Payer: No Typology Code available for payment source

## 2020-10-06 ENCOUNTER — Ambulatory Visit: Payer: No Typology Code available for payment source

## 2020-10-06 ENCOUNTER — Other Ambulatory Visit: Payer: Self-pay

## 2020-10-06 DIAGNOSIS — I252 Old myocardial infarction: Secondary | ICD-10-CM | POA: Diagnosis not present

## 2020-10-06 DIAGNOSIS — I214 Non-ST elevation (NSTEMI) myocardial infarction: Secondary | ICD-10-CM

## 2020-10-06 NOTE — Progress Notes (Signed)
Daily Session Note  Patient Details  Name: Todd Kelley MRN: [id number] Date of Birth: [birth year] Referring Provider:   Flowsheet Row Cardiac Rehab from 08/19/2020 in [facility] Cardiac and Pulmonary Rehab  Referring Provider Kathlyn Sacramento MD  [facility] Cardiologist: Dr. Ida Rogue       Encounter Date: 10/06/2020  Check In:  Session Check In - 10/06/20 [place]       Check-In   Supervising physician immediately available to respond to emergencies See telemetry face sheet for immediately available ER MD    Location [facility]-Cardiac & Pulmonary Rehab    Staff Present Birdie Sons, MPA, Nino Glow, MS, ASCM CEP, Exercise Physiologist;Amanda Oletta Darter, BA, ACSM CEP, Exercise Physiologist;Laureen Owens Shark, BS, RRT, CPFT    Virtual Visit No    Medication changes reported     No    Fall or balance concerns reported    No    Tobacco Cessation No Change    Warm-up and Cool-down Performed on first and last piece of equipment    Resistance Training Performed Yes    VAD Patient? No    PAD/SET Patient? No      Pain Assessment   Currently in Pain? No/denies                Social History   Tobacco Use  Smoking Status Former   Packs/day: 0.50   Years: 10.00   Pack years: 5.00   Types: Cigarettes   Quit date: 04/08/2015   Years since quitting: 5.5  Smokeless Tobacco Never    Goals Met:  Independence with exercise equipment Exercise tolerated well No report of cardiac concerns or symptoms Strength training completed today  Goals Unmet:  Not Applicable  Comments: Pt able to follow exercise prescription today without complaint.  Will continue to monitor for progression.    Dr. Emily Filbert is Medical Director for [place].  Dr. Ottie Glazier is Medical Director for [facility] Pulmonary Rehabilitation.

## 2020-10-08 ENCOUNTER — Other Ambulatory Visit: Payer: Self-pay

## 2020-10-08 ENCOUNTER — Encounter: Payer: Self-pay | Admitting: *Deleted

## 2020-10-08 ENCOUNTER — Encounter: Payer: Self-pay | Admitting: Internal Medicine

## 2020-10-08 ENCOUNTER — Ambulatory Visit: Payer: No Typology Code available for payment source

## 2020-10-08 DIAGNOSIS — I252 Old myocardial infarction: Secondary | ICD-10-CM | POA: Diagnosis not present

## 2020-10-08 DIAGNOSIS — I214 Non-ST elevation (NSTEMI) myocardial infarction: Secondary | ICD-10-CM

## 2020-10-08 NOTE — Progress Notes (Signed)
Daily Session Note  Patient Details  Name: Todd Kelley MRN: [id number] Date of Birth: [birth year] Referring Provider:   Flowsheet Row Cardiac Rehab from 08/19/2020 in [facility] Cardiac and Pulmonary Rehab  Referring Provider Kathlyn Sacramento MD  [facility] Cardiologist: Dr. Ida Rogue       Encounter Date: 10/08/2020  Check In:  Session Check In - 10/08/20 1713       Check-In   Supervising physician immediately available to respond to emergencies See telemetry face sheet for immediately available ER MD    Location [facility]-Cardiac & Pulmonary Rehab    Staff Present Birdie Sons, MPA, Nino Glow, MS, ASCM CEP, Exercise Physiologist;Melissa Caiola, RDN, LDN    Virtual Visit No    Medication changes reported     No    Fall or balance concerns reported    No    Tobacco Cessation No Change    Warm-up and Cool-down Performed on first and last piece of equipment    Resistance Training Performed Yes    VAD Patient? No    PAD/SET Patient? No      Pain Assessment   Currently in Pain? No/denies                Social History   Tobacco Use  Smoking Status Former   Packs/day: 0.50   Years: 10.00   Pack years: 5.00   Types: Cigarettes   Quit date: 04/08/2015   Years since quitting: 5.5  Smokeless Tobacco Never    Goals Met:  Independence with exercise equipment Exercise tolerated well No report of cardiac concerns or symptoms Strength training completed today  Goals Unmet:  Not Applicable  Comments: Pt able to follow exercise prescription today without complaint.  Will continue to monitor for progression.    Dr. Emily Filbert is Medical Director for [place].  Dr. Ottie Glazier is Medical Director for [facility] Pulmonary Rehabilitation.

## 2020-10-08 NOTE — Progress Notes (Signed)
Cardiac Individual Treatment Plan  Patient Details  Name: Todd Kelley MRN: [id number] Date of Birth: 1966-02-08 Referring Provider:   Flowsheet Row Cardiac Rehab from 08/19/2020 in [facility] Cardiac and Pulmonary Rehab  Referring Provider Kathlyn Sacramento MD  [facility] Cardiologist: Dr. Ida Rogue       Initial Encounter Date:  Flowsheet Row Cardiac Rehab from 08/19/2020 in [facility] Cardiac and Pulmonary Rehab  Date 08/19/20       Visit Diagnosis: NSTEMI (non-ST elevated myocardial infarction) [facility])  Patient's Home Medications on Admission:  Current Outpatient Medications:    aspirin (ASPIRIN LOW DOSE) 81 MG EC tablet, Take 1 tablet (81 mg total) by mouth daily. Swallow whole., Disp: 90 tablet, Rfl: 3   atorvastatin (LIPITOR) 10 MG tablet, Take 1 tablet (10 mg total) by mouth daily., Disp: 30 tablet, Rfl: 11   cetirizine (ZYRTEC) 10 MG tablet, Take 10 mg by mouth daily., Disp: , Rfl:    clopidogrel (PLAVIX) 75 MG tablet, Take 1 tablet (75 mg total) by mouth daily with breakfast., Disp: 30 tablet, Rfl: 11   Continuous Blood Gluc Receiver (DEXCOM G6 RECEIVER) DEVI, Use as instructed, Disp: 1 each, Rfl: 0   Continuous Blood Gluc Sensor (DEXCOM G6 SENSOR) MISC, Use for every 14 days., Disp: 3 each, Rfl: 2   Continuous Blood Gluc Transmit (DEXCOM G6 TRANSMITTER) MISC, USE AS INSTRUCTED, Disp: 1 each, Rfl: 0   empagliflozin (JARDIANCE) 10 MG TABS tablet, Take 1 tablet (10 mg total) by mouth daily before breakfast., Disp: 30 tablet, Rfl: 2   escitalopram (LEXAPRO) 10 MG tablet, Take 1 tablet (10 mg total) by mouth daily., Disp: 90 tablet, Rfl: 3   ezetimibe (ZETIA) 10 MG tablet, Take 1 tablet (10 mg total) by mouth daily., Disp: 30 tablet, Rfl: 11   fenofibrate (TRICOR) 145 MG tablet, TAKE 1 TABLET BY MOUTH  DAILY, Disp: 90 tablet, Rfl: 0   levothyroxine (SYNTHROID) 150 MCG tablet, Take 1 tablet (150 mcg total) by mouth daily before breakfast., Disp: 90 tablet, Rfl: 1   metFORMIN (GLUCOPHAGE XR)  500 MG 24 hr tablet, Take 1 tablet (500 mg total) by mouth daily with breakfast., Disp: 30 tablet, Rfl: 11   Multiple Vitamins-Minerals (MULTIVITAMIN ADULT) CHEW, Chew 1 tablet by mouth daily., Disp: , Rfl:    nitroGLYCERIN (NITROSTAT) 0.4 MG SL tablet, Place 1 tablet (0.4 mg total) under the tongue every 5 (five) minutes as needed for chest pain., Disp: 25 tablet, Rfl: 4   potassium chloride (KLOR-CON) 10 MEQ tablet, Take 1 tablet (10 mEq total) by mouth daily., Disp: 90 tablet, Rfl: 3   PROAIR RESPICLICK 341 (90 Base) MCG/ACT AEPB, USE 2 INHALATIONS BY MOUTH  EVERY 6 HOURS AS NEEDED, Disp: 1 each, Rfl: 0   sacubitril-valsartan (ENTRESTO) 24-26 MG, Take 1 tablet by mouth 2 (two) times daily., Disp: 60 tablet, Rfl: 11   torsemide (DEMADEX) 20 MG tablet, Take 1 tablet (20 mg) by mouth once daily in the morning, you may take an extra 1 tablet (20 mg) in the afternoon for abdominal swelling, Disp: 135 tablet, Rfl: 1  Past Medical History: Past Medical History:  Diagnosis Date   Acid reflux    Allergic asthma    rare flare up, worse with alllergies as of 12/2016   Coronary artery disease    a. 08/1998 Lateral MI/PCI/BMS [place]; b. 2001 PTCA x 2 2/2 ISR in [place]; c. 2002 PCI/brachyrx 2/2 ISR in [place]; d. 01/2001 CABG x 4: LIMA->LAD, RIMA->RI, VG->RPL->RPDA; e. 01/2014 MV: Low  risk w/ attenuation artifact and mild inf basal isch. Nl EF->Med rx.   DVT (deep venous thrombosis) ([place])    a. 01/2001 after CABG.   Erectile dysfunction    Hyperlipidemia LDL goal <70    Hypertension    Hypothyroidism    Palpitations    Pericardial effusion    a. 01/2001 Effusion w/ tamponade req window following CABG.   Tobacco use    vape, no cigarrette since 04/2015   Type II diabetes mellitus ([place])     Tobacco Use: Social History   Tobacco Use  Smoking Status Former   Packs/day: 0.50   Years: 10.00   Pack years: 5.00   Types: Cigarettes   Quit date: 04/08/2015   Years since quitting: 5.5  Smokeless Tobacco Never     Labs: Recent Review Flowsheet Data     Labs for ITP Cardiac and Pulmonary Rehab Latest Ref Rng & Units 12/27/2019 03/14/2020 07/25/2020 07/26/2020 09/15/2020   Cholestrol 0 - 200 mg/dL 137 125 - 207(H) 113   LDLCALC 0 - 99 mg/dL 80 70 - 138(H) 67   LDLDIRECT 0 - 99 mg/dL - - - - 68.4   HDL >40 mg/dL 37(L) 39(L) - 41 33(L)   Trlycerides <150 mg/dL 105 83 - 141 63   Hemoglobin A1c 4.8 - 5.6 % 7.2(A) 7.2(H) 7.4(H) - 7.3(H)        Exercise Target Goals: Exercise Program Goal: Individual exercise prescription set using results from initial 6 min walk test and THRR while considering  patient's activity barriers and safety.   Exercise Prescription Goal: Initial exercise prescription builds to 30-45 minutes a day of aerobic activity, 2-3 days per week.  Home exercise guidelines will be given to patient during program as part of exercise prescription that the participant will acknowledge.   Education: Aerobic Exercise: - Group verbal and visual presentation on the components of exercise prescription. Introduces F.I.T.T principle from ACSM for exercise prescriptions.  Reviews F.I.T.T. principles of aerobic exercise including progression. Written material given at graduation.   Education: Resistance Exercise: - Group verbal and visual presentation on the components of exercise prescription. Introduces F.I.T.T principle from ACSM for exercise prescriptions  Reviews F.I.T.T. principles of resistance exercise including progression. Written material given at graduation.    Education: Exercise & Equipment Safety: - Individual verbal instruction and demonstration of equipment use and safety with use of the equipment. Flowsheet Row Cardiac Rehab from 08/19/2020 in [facility] Cardiac and Pulmonary Rehab  Date 08/19/20  Educator [facility]  Instruction Review Code 1- Verbalizes Understanding       Education: Exercise Physiology & General Exercise Guidelines: - Group verbal and written instruction with  models to review the exercise physiology of the cardiovascular system and associated critical values. Provides general exercise guidelines with specific guidelines to those with heart or lung disease.    Education: Flexibility, Balance, Mind/Body Relaxation: - Group verbal and visual presentation with interactive activity on the components of exercise prescription. Introduces F.I.T.T principle from ACSM for exercise prescriptions. Reviews F.I.T.T. principles of flexibility and balance exercise training including progression. Also discusses the mind body connection.  Reviews various relaxation techniques to help reduce and manage stress (i.e. Deep breathing, progressive muscle relaxation, and visualization). Balance handout provided to take home. Written material given at graduation.   Activity Barriers & Risk Stratification:  Activity Barriers & Cardiac Risk Stratification - 08/19/20 0934       Activity Barriers & Cardiac Risk Stratification   Activity Barriers Chest Pain/Angina;Muscular Weakness;Shortness of  Breath;Deconditioning;Joint Problems   tweaked hip earlier this year, occasional pain   Cardiac Risk Stratification High             6 Minute Walk:  6 Minute Walk     Row Name 08/19/20 0932         6 Minute Walk   Phase Initial     Distance 1470 feet     Walk Time 6 minutes     # of Rest Breaks 0     MPH 2.78     METS 3.61     RPE 8     VO2 Peak 12.62     Symptoms No     Resting HR 64 bpm     Resting BP 126/66     Resting Oxygen Saturation  98 %     Exercise Oxygen Saturation  during 6 min walk 96 %     Max Ex. HR 79 bpm     Max Ex. BP 136/70              Oxygen Initial Assessment:   Oxygen Re-Evaluation:   Oxygen Discharge (Final Oxygen Re-Evaluation):   Initial Exercise Prescription:  Initial Exercise Prescription - 08/19/20 0900       Date of Initial Exercise RX and Referring Provider   Date 08/19/20    Referring Provider Kathlyn Sacramento MD    Primary Cardiologist: Dr. Ida Rogue     Treadmill   MPH 2.7    Grade 1    Minutes 15    METs 3.44      Recumbant Bike   Level 4    RPM 50    Watts 50    Minutes 15    METs 3.5      Arm Ergometer   Level 2    RPM 25    Minutes 15    METs 3      Recumbant Elliptical   Level 2    RPM 50    Minutes 15    METs 3      REL-XR   Level 3    Speed 50    Minutes 15    METs 3      Biostep-RELP   Level 4    SPM 50    Minutes 15    METs 3      Track   Laps 40    Minutes 15    METs 3.2      Prescription Details   Frequency (times per week) 3    Duration Progress to 30 minutes of continuous aerobic without signs/symptoms of physical distress      Intensity   THRR 40-80% of Max Heartrate 104-144    Ratings of Perceived Exertion 11-13    Perceived Dyspnea 0-4      Progression   Progression Continue to progress workloads to maintain intensity without signs/symptoms of physical distress.      Resistance Training   Training Prescription Yes    Weight 4 lb    Reps 10-15             Perform Capillary Blood Glucose checks as needed.  Exercise Prescription Changes:   Exercise Prescription Changes     Row Name 08/19/20 0900 09/04/20 0900 09/17/20 1500 09/22/20 1800 10/01/20 1100     Response to Exercise   Blood Pressure (Admit) 126/66 110/60 122/68 -- 102/56   Blood Pressure (Exercise) 136/70 150/58 138/64 -- 110/60   Blood Pressure (Exit) 120/62 102/60  118/64 -- 116/62   Heart Rate (Admit) 64 bpm 60 bpm 78 bpm -- 63 bpm   Heart Rate (Exercise) 79 bpm 117 bpm 102 bpm -- 107 bpm   Heart Rate (Exit) 66 bpm 82 bpm 64 bpm -- 66 bpm   Oxygen Saturation (Admit) 98 % -- -- -- --   Oxygen Saturation (Exercise) 96 % -- -- -- --   Rating of Perceived Exertion (Exercise) 8 12 14  -- 14   Symptoms none none none -- none   Comments walk test results first full week of exercise -- -- --   Duration -- Continue with 30 min of aerobic exercise without signs/symptoms of  physical distress. Continue with 30 min of aerobic exercise without signs/symptoms of physical distress. -- Continue with 30 min of aerobic exercise without signs/symptoms of physical distress.   Intensity -- THRR unchanged THRR unchanged -- THRR unchanged     Progression   Progression -- Continue to progress workloads to maintain intensity without signs/symptoms of physical distress. Continue to progress workloads to maintain intensity without signs/symptoms of physical distress. -- Continue to progress workloads to maintain intensity without signs/symptoms of physical distress.   Average METs -- 5.34 8.88 -- 9.3     Resistance Training   Training Prescription -- Yes Yes -- Yes   Weight -- 5 lb 5 lb -- 5 lb   Reps -- 10-15 10-15 -- 10-15     Interval Training   Interval Training -- No No -- No     Treadmill   MPH -- 2.7 3.7 -- 3.8   Grade -- 15 15 -- 15   Minutes -- 15 15 -- 15   METs -- 8.6 11.5 -- 11.8     Recumbant Bike   Level -- 5 -- -- --   Watts -- 57 -- -- --   Minutes -- 15 -- -- --   METs -- 3.63 -- -- --     REL-XR   Level -- -- 8 -- 8   Minutes -- -- 15 -- 15   METs -- -- 6.1 -- 6.7     Home Exercise Plan   Plans to continue exercise at -- -- -- [employer] (comment)  Neurosurgeon --   Frequency -- -- -- Add 3 additional days to program exercise sessions.  Start with 1 day --   Initial Home Exercises Provided -- -- -- 09/22/20 --            Exercise Comments:   Exercise Comments     Row Name 08/25/20 0739 08/25/20 0932         Exercise Comments DIscharged Todd Kelley called to state he has a new job and will not be able to attend the program DIscharged Todd Kelley called to state he has a new job and will not be able to attend the program   ERROR   wrong patient                                                                      First full day of exercise!  Patient was oriented to gym and equipment including functions, settings, policies, and procedures.   Patient's individual exercise prescription and treatment plan were reviewed.  All  starting workloads were established based on the results of the 6 minute walk test done at initial orientation visit.  The plan for exercise progression was also introduced and progression will be customized based on patient's performance and goals.               Exercise Goals and Review:   Exercise Goals     Row Name 08/19/20 0939             Exercise Goals   Increase Physical Activity Yes       Intervention Provide advice, education, support and counseling about physical activity/exercise needs.;Develop an individualized exercise prescription for aerobic and resistive training based on initial evaluation findings, risk stratification, comorbidities and participant's personal goals.       Expected Outcomes Short Term: Attend rehab on a regular basis to increase amount of physical activity.;Long Term: Add in home exercise to make exercise part of routine and to increase amount of physical activity.;Long Term: Exercising regularly at least 3-5 days a week.       Increase Strength and Stamina Yes       Intervention Provide advice, education, support and counseling about physical activity/exercise needs.;Develop an individualized exercise prescription for aerobic and resistive training based on initial evaluation findings, risk stratification, comorbidities and participant's personal goals.       Expected Outcomes Short Term: Increase workloads from initial exercise prescription for resistance, speed, and METs.;Long Term: Improve cardiorespiratory fitness, muscular endurance and strength as measured by increased METs and functional capacity (6MWT);Short Term: Perform resistance training exercises routinely during rehab and add in resistance training at home       Able to understand and use rate of perceived exertion (RPE) scale Yes       Intervention Provide education and explanation on how to use RPE scale        Expected Outcomes Short Term: Able to use RPE daily in rehab to express subjective intensity level;Long Term:  Able to use RPE to guide intensity level when exercising independently       Able to understand and use Dyspnea scale Yes       Intervention Provide education and explanation on how to use Dyspnea scale       Expected Outcomes Short Term: Able to use Dyspnea scale daily in rehab to express subjective sense of shortness of breath during exertion;Long Term: Able to use Dyspnea scale to guide intensity level when exercising independently       Knowledge and understanding of Target Heart Rate Range (THRR) Yes       Intervention Provide education and explanation of THRR including how the numbers were predicted and where they are located for reference       Expected Outcomes Short Term: Able to use daily as guideline for intensity in rehab;Short Term: Able to state/look up THRR;Long Term: Able to use THRR to govern intensity when exercising independently       Able to check pulse independently Yes       Intervention Provide education and demonstration on how to check pulse in carotid and radial arteries.;Review the importance of being able to check your own pulse for safety during independent exercise       Expected Outcomes Short Term: Able to explain why pulse checking is important during independent exercise;Long Term: Able to check pulse independently and accurately       Understanding of Exercise Prescription Yes       Intervention Provide education, explanation, and written  materials on patient's individual exercise prescription       Expected Outcomes Short Term: Able to explain program exercise prescription;Long Term: Able to explain home exercise prescription to exercise independently                Exercise Goals Re-Evaluation :  Exercise Goals Re-Evaluation     Row Name 08/25/20 0934 09/04/20 0953 09/17/20 1552 09/22/20 1729 10/01/20 1125     Exercise Goal Re-Evaluation    Exercise Goals Review Able to understand and use rate of perceived exertion (RPE) scale;Knowledge and understanding of Target Heart Rate Range (THRR);Able to understand and use Dyspnea scale;Understanding of Exercise Prescription Increase Physical Activity;Increase Strength and Stamina Increase Physical Activity;Increase Strength and Stamina;Understanding of Exercise Prescription Increase Physical Activity;Increase Strength and Stamina;Understanding of Exercise Prescription Increase Physical Activity;Increase Strength and Stamina   Comments Reviewed RPE and dyspnea scales, THR and program prescription with pt today.  Pt voiced understanding and was given a copy of goals to take home. Todd Kelley is starting off well for his first couple weeks of exercise. He has already increased to 5 lb handweights and increased his incline on the Treadmill to 15%. Will Continue to monitor Todd Kelley is doing well in rehab.  He is up to level 8 on the XR. We will encourage him to increase his weights and continue to montior his progress. Reviewed home exercise with pt today.  Pt plans to walk and join [employer] for exercise.  Reviewed THR, pulse, RPE, sign and symptoms, pulse oximetery and when to call 911 or MD.  Also discussed weather considerations and indoor options.  Pt voiced understanding. Todd Kelley is progressing well. he has increased speed and grade on TM.  Staff will continue to monitor progress.   Expected Outcomes Short: Use RPE daily to regulate intensity. Long: Follow program prescription in THR. Short: Continue to build up speed on treadmill Long: Increase overall MET level Short: Try 6 lb handweights Long: continue to improve stamina Short: Add on 1 day of exercise at home and start checking HR Long: Continue to exercise independently at appropriate prescription Short: maintain consistent attendance Long: continue to build stamina            Discharge Exercise Prescription (Final Exercise Prescription Changes):   Exercise Prescription Changes - 10/01/20 1100       Response to Exercise   Blood Pressure (Admit) 102/56    Blood Pressure (Exercise) 110/60    Blood Pressure (Exit) 116/62    Heart Rate (Admit) 63 bpm    Heart Rate (Exercise) 107 bpm    Heart Rate (Exit) 66 bpm    Rating of Perceived Exertion (Exercise) 14    Symptoms none    Duration Continue with 30 min of aerobic exercise without signs/symptoms of physical distress.    Intensity THRR unchanged      Progression   Progression Continue to progress workloads to maintain intensity without signs/symptoms of physical distress.    Average METs 9.3      Resistance Training   Training Prescription Yes    Weight 5 lb    Reps 10-15      Interval Training   Interval Training No      Treadmill   MPH 3.8    Grade 15    Minutes 15    METs 11.8      REL-XR   Level 8    Minutes 15    METs 6.7  Nutrition:  Target Goals: Understanding of nutrition guidelines, daily intake of sodium <1557m, cholesterol <2062m calories 30% from fat and 7% or less from saturated fats, daily to have 5 or more servings of fruits and vegetables.  Education: All About Nutrition: -Group instruction provided by verbal, written material, interactive activities, discussions, models, and posters to present general guidelines for heart healthy nutrition including fat, fiber, MyPlate, the role of sodium in heart healthy nutrition, utilization of the nutrition label, and utilization of this knowledge for meal planning. Follow up email sent as well. Written material given at graduation. [facility]  Education need identified 08/19/20       Biometrics:  Pre Biometrics - 08/19/20 0940       Pre Biometrics   Height 5' 10.2" (1.783 m)    Weight 218 lb (98.9 kg)    BMI (Calculated) 31.1    Single Leg Stand 30 seconds              Nutrition Therapy Plan and Nutrition  Goals:  Nutrition Therapy & Goals - 09/25/20 1649       Nutrition Therapy   Diet Heart healthy, low Na, diabetes friendly    Drug/Food Interactions Statins/Certain Fruits    Protein (specify units) 75g    Fiber 30 grams    Whole Grain Foods 3 servings    Saturated Fats 12 max. grams    Fruits and Vegetables 8 servings/day    Sodium 1.5 grams      Personal Nutrition Goals   Nutrition Goal ST: Practice MyPlate and CHO counting (2-3 CHO per snack, 1-2 CHO per snack), CHO with protein and fat LT: 8 fruits and vegetables per day, a rainbow of fruits and vegetables per week, maintain BG during the day    Comments Will drink diet soda. B: triple zero yogurt, activia yogurt (helps with reflux), protein bar  - wants to fix 2-3 scrambled eggs with toast and yogurt S: protein bar L: 2 packs of tuna, crackers, sugar free pudding S: handful on nuts D: variety - last night grilled cheese with tomato soup. He likes salmon and catfish, rarely eats red meat, will eat baked chicken. Will sometimes have mashed potatoes, rice (flavored rice packets), vegetables. Uses Mrs. Dash. Butter or pam. Drinks: tea and lemonade sugar free packets, coffee (sugar free creamer). Discussed heart healthy eating and diabetes friendly eating.      Intervention Plan   Intervention Prescribe, educate and counsel regarding individualized specific dietary modifications aiming towards targeted core components such as weight, hypertension, lipid management, diabetes, heart failure and other comorbidities.    Expected Outcomes Short Term Goal: Understand basic principles of dietary content, such as calories, fat, sodium, cholesterol and nutrients.;Short Term Goal: A plan has been developed with personal nutrition goals set during dietitian appointment.;Long Term Goal: Adherence to prescribed nutrition plan.             Nutrition Assessments:  MEDIFICTS Score Key: ?70 Need to make dietary changes  40-70 Heart Healthy Diet ? 40  Therapeutic Level Cholesterol Diet  [facility]  Picture Your Plate Total Score on Admission 67      Picture Your Plate Scores: <4<23nhealthy dietary pattern with much room for improvement. 41-50 Dietary pattern unlikely to meet recommendations for good health and room for improvement. 51-60 More healthful dietary pattern, with some room for improvement.  >60 Healthy  dietary pattern, although there may be some specific behaviors that could be improved.    Nutrition Goals Re-Evaluation:  Nutrition Goals Re-Evaluation     Row Name 09/22/20 1801             Goals   Comment Todd Kelley has not met with the RD to establish goals. He has an appointment with her this Thursday, 7/28.       Expected Outcome Short: Meet with RD Long: Continue to follow heart healthy diet                Nutrition Goals Discharge (Final Nutrition Goals Re-Evaluation):  Nutrition Goals Re-Evaluation - 09/22/20 1801       Goals   Comment Todd Kelley has not met with the RD to establish goals. He has an appointment with her this Thursday, 7/28.    Expected Outcome Short: Meet with RD Long: Continue to follow heart healthy diet             Psychosocial: Target Goals: Acknowledge presence or absence of significant depression and/or stress, maximize coping skills, provide positive support system. Participant is able to verbalize types and ability to use techniques and skills needed for reducing stress and depression.   Education: Stress, Anxiety, and Depression - Group verbal and visual presentation to define topics covered.  Reviews how body is impacted by stress, anxiety, and depression.  Also discusses healthy ways to reduce stress and to treat/manage anxiety and depression.  Written material given at graduation.   Education: Sleep Hygiene -Provides group verbal and written instruction about how sleep can affect your health.  Define sleep  hygiene, discuss sleep cycles and impact of sleep habits. Review good sleep hygiene tips.    Initial Review & Psychosocial Screening:  Initial Psych Review & Screening - 08/11/20 1536       Initial Review   Current issues with Current Stress Concerns      Family Dynamics   Good Support System? Yes   wife, dad, son     Barriers   Psychosocial barriers to participate in program There are no identifiable barriers or psychosocial needs.;The patient should benefit from training in stress management and relaxation.      Screening Interventions   Interventions Encouraged to exercise;To provide support and resources with identified psychosocial needs;Provide feedback about the scores to participant    Expected Outcomes Short Term goal: Utilizing psychosocial counselor, staff and physician to assist with identification of specific Stressors or current issues interfering with healing process. Setting desired goal for each stressor or current issue identified.;Long Term Goal: Stressors or current issues are controlled or eliminated.;Short Term goal: Identification and review with participant of any Quality of Life or Depression concerns found by scoring the questionnaire.;Long Term goal: The participant improves quality of Life and PHQ9 Scores as seen by post scores and/or verbalization of changes             Quality of Life Scores:   Quality of Life - 08/19/20 0941       Quality of Life   Select Quality of Life      Quality of Life Scores   Health/Function Pre 23.87 %    Socioeconomic Pre 26 %    Psych/Spiritual Pre 26.64 %    Family Pre 27.6 %    GLOBAL Pre 25.44 %            Scores of 19 and below usually indicate a poorer quality of life in these areas.  A  difference of  2-3 points is a clinically meaningful difference.  A difference of 2-3 points in the total score of the Quality of Life Index has been associated with significant improvement in overall quality of life,  self-image, physical symptoms, and general health in studies assessing change in quality of life.  PHQ-9: Recent Review Flowsheet Data     Depression screen [facility] 2/9 08/19/2020 03/14/2020 03/09/2019 01/16/2018 01/14/2017   Decreased Interest 1 0 0 0 0   Down, Depressed, Hopeless 0 0 0 0 0   PHQ - 2 Score 1 0 0 0 0   Altered sleeping 0 - - - -   Tired, decreased energy 1 - - - -   Change in appetite 0 - - - -   Feeling bad or failure about yourself  0 - - - -   Trouble concentrating 0 - - - -   Moving slowly or fidgety/restless 0 - - - -   Suicidal thoughts 0 - - - -   PHQ-9 Score 2 - - - -   Difficult doing work/chores Not difficult at all - - - -      Interpretation of Total Score  Total Score Depression Severity:  1-4 = Minimal depression, 5-9 = Mild depression, 10-14 = Moderate depression, 15-19 = Moderately severe depression, 20-27 = Severe depression   Psychosocial Evaluation and Intervention:  Psychosocial Evaluation - 08/11/20 1551       Psychosocial Evaluation & Interventions   Interventions Encouraged to exercise with the program and follow exercise prescription;Stress management education;Relaxation education    Comments Todd Kelley reports doing okay after his NSTEMI. He is still struggling with feeling tired and low stamina, but is trying to slowly increase his activity level. 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 He does have twin 41 year olds at home which keep him quite active. He states his wife, older son, and dad are his main support system. He takes Lexapro at night to help him sleep because his mind races sometimes. At his appointment today, his cardiologist placed a holter monitor to evaluate for heart abnormalities since he has been experiencing palpitations and tiredness. He is very motivated to care for his body and is looking forward to coming to cardiac rehab.    Expected Outcomes Short: attend cardiac rehab for education  and exercise. Long: develop and maintain positive self care habits.    Continue Psychosocial Services  Follow up required by staff             Psychosocial Re-Evaluation:  Psychosocial Re-Evaluation     Todd Kelley Name 09/22/20 1747             Psychosocial Re-Evaluation   Current issues with Current Stress Concerns       Comments Todd Kelley is doing very well mentally. He did his get his heart monitor and doctor told him there were no major concerns. He feels his palpitations have subsided and agreed to contact doctor if he starts to feel them again. He is still a little nervous exercising as he worries about his last heart event. He takes Lexapro at night and feels it helps. He has good support from family and friends. He just saw his cardiologist today and reports everything looked good and doesn't have to go back for another 6 months. Does not feel any need for other medications or therapy.       Expected Outcomes Short: Continue  attendance with rehab Long: Continue to utilize exercise for stress management       Interventions Encouraged to attend Cardiac Rehabilitation for the exercise       Continue Psychosocial Services  Follow up required by staff                Psychosocial Discharge (Final Psychosocial Re-Evaluation):  Psychosocial Re-Evaluation - 09/22/20 1747       Psychosocial Re-Evaluation   Current issues with Current Stress Concerns    Comments Todd Kelley is doing very well mentally. He did his get his heart monitor and doctor told him there were no major concerns. He feels his palpitations have subsided and agreed to contact doctor if he starts to feel them again. He is still a little nervous exercising as he worries about his last heart event. He takes Lexapro at night and feels it helps. He has good support from family and friends. He just saw his cardiologist today and reports everything looked good and doesn't have to go back for another 6 months. Does not feel any need for  other medications or therapy.    Expected Outcomes Short: Continue attendance with rehab Long: Continue to utilize exercise for stress management    Interventions Encouraged to attend Cardiac Rehabilitation for the exercise    Continue Psychosocial Services  Follow up required by staff             Vocational Rehabilitation: Provide vocational rehab assistance to qualifying candidates.   Vocational Rehab Evaluation & Intervention:  Vocational Rehab - 08/11/20 1537       Initial Vocational Rehab Evaluation & Intervention   Assessment shows need for Vocational Rehabilitation No             Education: Education Goals: Education classes will be provided on a variety of topics geared toward better understanding of heart health and risk factor modification. Participant will state understanding/return demonstration of topics presented as noted by education test scores.  Learning Barriers/Preferences:  Learning Barriers/Preferences - 08/11/20 1537       Learning Barriers/Preferences   Learning Barriers None    Learning Preferences None             General Cardiac Education Topics:  AED/CPR: - Group verbal and written instruction with the use of models to demonstrate the basic use of the AED with the basic ABC's of resuscitation.   Anatomy and Cardiac Procedures: - Group verbal and visual presentation and models provide information about basic cardiac anatomy and function. Reviews the testing methods done to diagnose heart disease and the outcomes of the test results. Describes the treatment choices: Medical Management, Angioplasty, or Coronary Bypass Surgery for treating various heart conditions including Myocardial Infarction, Angina, Valve Disease, and Cardiac Arrhythmias.  Written material given at graduation.   Medication Safety: - Group verbal and visual instruction to review commonly prescribed medications for heart and lung disease. Reviews the medication, class  of the drug, and side effects. Includes the steps to properly store meds and maintain the prescription regimen.  Written material given at graduation.   Intimacy: - Group verbal instruction through game format to discuss how heart and lung disease can affect sexual intimacy. Written material given at graduation..   Know Your Numbers and Heart Failure: - Group verbal and visual instruction to discuss disease risk factors for cardiac and pulmonary disease and treatment options.  Reviews associated critical values for Overweight/Obesity, Hypertension, Cholesterol, and Diabetes.  Discusses basics of heart failure: signs/symptoms and treatments.  Introduces Heart Failure Zone chart for action plan for heart failure.  Written material given at graduation. Flowsheet Row Cardiac Rehab from 08/19/2020 in [facility] Cardiac and Pulmonary Rehab  Education need identified 08/19/20       Infection Prevention: - Provides verbal and written material to individual with discussion of infection control including proper hand washing and proper equipment cleaning during exercise session. Flowsheet Row Cardiac Rehab from 08/19/2020 in [facility] Cardiac and Pulmonary Rehab  Date 08/19/20  Educator [facility]  Instruction Review Code 1- Verbalizes Understanding       Falls Prevention: - Provides verbal and written material to individual with discussion of falls prevention and safety. Flowsheet Row Cardiac Rehab from 08/19/2020 in [facility] Cardiac and Pulmonary Rehab  Date 08/19/20  Educator [facility]  Instruction Review Code 1- Verbalizes Understanding       Other: -Provides group and verbal instruction on various topics (see comments)   Knowledge Questionnaire Score:  Knowledge Questionnaire Score - 08/19/20 0942       Knowledge Questionnaire Score   Pre Score 24/26 Education Focus: Nutrition, Risk factors             Core Components/Risk Factors/Patient Goals at Admission:  Personal Goals and Risk Factors at  Admission - 08/19/20 0942       Core Components/Risk Factors/Patient Goals on Admission    Weight Management Yes;Obesity;Weight Loss    Intervention Weight Management: Develop a combined nutrition and exercise program designed to reach desired caloric intake, while maintaining appropriate intake of nutrient and fiber, sodium and fats, and appropriate energy expenditure required for the weight goal.;Weight Management: Provide education and appropriate resources to help participant work on and attain dietary goals.;Weight Management/Obesity: Establish reasonable short term and long term weight goals.;Obesity: Provide education and appropriate resources to help participant work on and attain dietary goals.    Admit Weight 218 lb (98.9 kg)    Goal Weight: Short Term 210 lb (95.3 kg)    Goal Weight: Long Term 200 lb (90.7 kg)    Expected Outcomes Short Term: Continue to assess and modify interventions until short term weight is achieved;Long Term: Adherence to nutrition and physical activity/exercise program aimed toward attainment of established weight goal;Weight Loss: Understanding of general recommendations for a balanced deficit meal plan, which promotes 1-2 lb weight loss per week and includes a negative energy balance of [phone] kcal/d;Understanding recommendations for meals to include 15-35% energy as protein, 25-35% energy from fat, 35-60% energy from carbohydrates, less than 239m of dietary cholesterol, 20-35 gm of total fiber daily;Understanding of distribution of calorie intake throughout the day with the consumption of 4-5 meals/snacks    Diabetes Yes    Intervention Provide education about signs/symptoms and action to take for hypo/hyperglycemia.;Provide education about proper nutrition, including hydration, and aerobic/resistive exercise prescription along with prescribed medications to achieve blood glucose in normal ranges: Fasting glucose 65-99 mg/dL    Expected Outcomes Short Term:  Participant verbalizes understanding of the signs/symptoms and immediate care of hyper/hypoglycemia, proper foot care and importance of medication, aerobic/resistive exercise and nutrition plan for blood glucose control.;Long Term: Attainment of HbA1C < 7%.    Hypertension Yes    Intervention Provide education on lifestyle modifcations including regular physical activity/exercise, weight management, moderate sodium restriction and increased consumption of fresh fruit, vegetables, and low fat dairy, alcohol moderation, and smoking cessation.;Monitor prescription use compliance.    Expected Outcomes Short Term: Continued assessment and intervention until BP is < 140/967mHG in hypertensive participants. < 130/8022mG  in hypertensive participants with diabetes, heart failure or chronic kidney disease.;Long Term: Maintenance of blood pressure at goal levels.    Lipids Yes    Intervention Provide education and support for participant on nutrition & aerobic/resistive exercise along with prescribed medications to achieve LDL <36m, HDL >419m    Expected Outcomes Short Term: Participant states understanding of desired cholesterol values and is compliant with medications prescribed. Participant is following exercise prescription and nutrition guidelines.;Long Term: Cholesterol controlled with medications as prescribed, with individualized exercise RX and with personalized nutrition plan. Value goals: LDL < 7029mHDL > 40 mg.             Education:Diabetes - Individual verbal and written instruction to review signs/symptoms of diabetes, desired ranges of glucose level fasting, after meals and with exercise. Acknowledge that pre and post exercise glucose checks will be done for 3 sessions at entry of program. [place] [facility]  Date 08/11/20  Educator [facility] Review Code 1- Verbalizes Understanding       Core Components/Risk  Factors/Patient Goals Review:   Goals and Risk Factor Review     Row Name 09/22/20 1742             Core Components/Risk Factors/Patient Goals Review   Personal Goals Review Weight Management/Obesity;Hypertension;Diabetes;Lipids       Review Todd Kelley doing well. He just saw cardiologist today and reports everything went well. He is checking his weight every morning - right now he is 210 lb- wants to get down to 195 lb. He is meeting with the RD soon to discuss goals. Todd Kelley BP routinely which ranges between 110229Nstolic and  60/98/92Jastolic. Staying compliant withall  medications. [facility] have been [facility]. Reports 100-120 fasting. He notices a spike when he eats heavy breads or biscuits. He is going to meet with the RD this Thursday to learn about a diabetic friendly diet. Last good newd he heard about his cholesterol was his total cholesterol was 113!       Expected Outcomes Short: Work on weight loss with appropriate diet changes Long: Continue to manage lifestyle risk factors                Core Components/Risk Factors/Patient Goals at Discharge (Final Review):   Goals and Risk Factor Review - 09/22/20 1742       Core Components/Risk Factors/Patient Goals Review   Personal Goals Review Weight Management/Obesity;Hypertension;Diabetes;Lipids    Review Todd Kelley doing well. He just saw cardiologist today and reports everything went well. He is checking his weight every morning - right now he is 210 lb- wants to get down to 195 lb. He is meeting with the RD soon to discuss goals. Todd Kelley BP routinely which ranges between 110194Rstolic and  60/74/08Xastolic. Staying compliant withall  medications. [facility]. Reports 100-120 fasting. He notices a spike when he eats heavy breads or biscuits. He is going to meet with the RD this Thursday to learn about a diabetic friendly diet. Last good newd he heard  about his cholesterol was his total cholesterol was 113!    Expected Outcomes Short: Work on weight loss with appropriate diet changes Long: Continue to manage lifestyle risk factors             ITP Comments:  ITP Comments     Row Name 08/11/20 1554  08/19/20 0931 08/25/20 0738 08/25/20 0931 09/10/20 0912   ITP Comments Initial telephone orientation completed. Diagnosis can be found in [facility] 5/27. EP orientation scheduled for Tuesday 6/21 at 8am. Completed 6MWT and gym orientation. Initial ITP created and sent for review to Dr. Emily Filbert, Medical Director. Error  [place] has not been diascharged.  Wrong Chart First full day of exercise!  Patient was oriented to gym and equipment including functions, settings, policies, and procedures.  Patient's individual exercise prescription and treatment plan were reviewed.  All starting workloads were established based on the results of the 6 minute walk test done at initial orientation visit.  The plan for exercise progression was also introduced and progression will be customized based on patient's performance and goals. 30 Day review completed. Medical Director ITP review done, changes made as directed, and signed approval by Medical Director.    [place] Name 10/08/20 0734           ITP Comments 30 Day review completed. Medical Director ITP review done, changes made as directed, and signed approval by Medical Director.                Comments:

## 2020-10-09 DIAGNOSIS — I252 Old myocardial infarction: Secondary | ICD-10-CM | POA: Diagnosis not present

## 2020-10-09 DIAGNOSIS — I214 Non-ST elevation (NSTEMI) myocardial infarction: Secondary | ICD-10-CM

## 2020-10-09 NOTE — Progress Notes (Signed)
Daily Session Note  Patient Details  Name: Todd Kelley MRN: [id number] Date of Birth: 1965/10/27 Referring Provider:   Flowsheet Row Cardiac Rehab from 08/19/2020 in [facility] Cardiac and Pulmonary Rehab  Referring Provider Kathlyn Sacramento MD  [facility] Cardiologist: Dr. Ida Rogue       Encounter Date: 10/09/2020  Check In:  Session Check In - 10/09/20 [place]       Check-In   Supervising physician immediately available to respond to emergencies See telemetry face sheet for immediately available ER MD    Location [facility]-Cardiac & Pulmonary Rehab    Staff Present Birdie Sons, MPA, Nino Glow, MS, ASCM CEP, Exercise Physiologist;Melissa Caiola, RDN, LDN    Virtual Visit No    Medication changes reported     No    Fall or balance concerns reported    No    Tobacco Cessation No Change    Warm-up and Cool-down Performed on first and last piece of equipment    Resistance Training Performed Yes    VAD Patient? No    PAD/SET Patient? No      Pain Assessment   Currently in Pain? No/denies                Social History   Tobacco Use  Smoking Status Former   Packs/day: 0.50   Years: 10.00   Pack years: 5.00   Types: Cigarettes   Quit date: 04/08/2015   Years since quitting: 5.5  Smokeless Tobacco Never    Goals Met:  Independence with exercise equipment Exercise tolerated well No report of cardiac concerns or symptoms Strength training completed today  Goals Unmet:  Not Applicable  Comments: Pt able to follow exercise prescription today without complaint.  Will continue to monitor for progression.    Dr. Emily Filbert is Medical Director for [name].  Dr. Ottie Glazier is Medical Director for [facility] Pulmonary Rehabilitation.

## 2020-10-10 ENCOUNTER — Ambulatory Visit: Payer: No Typology Code available for payment source

## 2020-10-10 ENCOUNTER — Telehealth: Payer: Self-pay | Admitting: *Deleted

## 2020-10-10 DIAGNOSIS — R9431 Abnormal electrocardiogram [ECG] [EKG]: Secondary | ICD-10-CM

## 2020-10-10 NOTE — Telephone Encounter (Signed)
-----   Message from Arvil Chaco, PA-C sent at 10/09/2020  2:57 PM EDT ----- Cardiac monitor showed several different heart rhythms, some of which corresponded with triggered events.  Both slower and faster heart rhythms present, as well as extra beats from the top and bottom chambers of the heart.  Recommend EP referral, if agreeable.

## 2020-10-10 NOTE — Telephone Encounter (Signed)
Spoke to pt. Notified of zio cardiac monitor results and provider's recc.  Pt is agreeable to EP referral.  Referral order placed. Pt aware scheduling will contact him at a later time to set up consult.  Pt has no further questions at this time.

## 2020-10-13 ENCOUNTER — Ambulatory Visit: Payer: No Typology Code available for payment source

## 2020-10-13 ENCOUNTER — Encounter: Payer: No Typology Code available for payment source | Admitting: *Deleted

## 2020-10-13 ENCOUNTER — Other Ambulatory Visit: Payer: Self-pay

## 2020-10-13 DIAGNOSIS — I252 Old myocardial infarction: Secondary | ICD-10-CM | POA: Diagnosis not present

## 2020-10-13 DIAGNOSIS — I214 Non-ST elevation (NSTEMI) myocardial infarction: Secondary | ICD-10-CM

## 2020-10-13 NOTE — Progress Notes (Signed)
Daily Session Note  Patient Details  Name: Todd Kelley MRN: [id number] Date of Birth: [birth year] Referring Provider:   Flowsheet Row Cardiac Rehab from 08/19/2020 in [facility] Cardiac and Pulmonary Rehab  Referring Provider Kathlyn Sacramento MD  [facility] Cardiologist: Dr. Ida Rogue       Encounter Date: 10/13/2020  Check In:  Session Check In - 10/13/20 1713       Check-In   Supervising physician immediately available to respond to emergencies See telemetry face sheet for immediately available ER MD    Location [facility]-Cardiac & Pulmonary Rehab    Staff Present Renita Papa, RN BSN;Joseph Tessie Fass, RCP,RRT,BSRT;Amanda Monette, [place], ACSM CEP, Exercise Physiologist    Virtual Visit No    Medication changes reported     No    Fall or balance concerns reported    No    Warm-up and Cool-down Performed on first and last piece of equipment    Resistance Training Performed Yes    VAD Patient? No    PAD/SET Patient? No      Pain Assessment   Currently in Pain? No/denies                Social History   Tobacco Use  Smoking Status Former   Packs/day: 0.50   Years: 10.00   Pack years: 5.00   Types: Cigarettes   Quit date: 04/08/2015   Years since quitting: 5.5  Smokeless Tobacco Never    Goals Met:  Independence with exercise equipment Exercise tolerated well No report of cardiac concerns or symptoms Strength training completed today  Goals Unmet:  Not Applicable  Comments: Pt able to follow exercise prescription today without complaint.  Will continue to monitor for progression.    Dr. Emily Filbert is Medical Director for [place].  Dr. Ottie Glazier is Medical Director for [facility] Pulmonary Rehabilitation.

## 2020-10-15 ENCOUNTER — Ambulatory Visit: Payer: No Typology Code available for payment source

## 2020-10-17 ENCOUNTER — Ambulatory Visit: Payer: No Typology Code available for payment source

## 2020-10-20 ENCOUNTER — Encounter: Payer: No Typology Code available for payment source | Admitting: *Deleted

## 2020-10-20 ENCOUNTER — Ambulatory Visit: Payer: No Typology Code available for payment source

## 2020-10-20 ENCOUNTER — Other Ambulatory Visit: Payer: Self-pay

## 2020-10-20 DIAGNOSIS — I214 Non-ST elevation (NSTEMI) myocardial infarction: Secondary | ICD-10-CM

## 2020-10-20 DIAGNOSIS — I252 Old myocardial infarction: Secondary | ICD-10-CM | POA: Diagnosis not present

## 2020-10-20 NOTE — Progress Notes (Signed)
Daily Session Note  Patient Details  Name: Todd Kelley MRN: [id number] Date of Birth: September 23, 1965 Referring Provider:   Flowsheet Row Cardiac Rehab from 08/19/2020 in [facility] Cardiac and Pulmonary Rehab  Referring Provider Kathlyn Sacramento MD  [facility] Cardiologist: Dr. Ida Rogue       Encounter Date: 10/20/2020  Check In:  Session Check In - 10/20/20 1710       Check-In   Supervising physician immediately available to respond to emergencies See telemetry face sheet for immediately available ER MD    Location [facility]-Cardiac & Pulmonary Rehab    Staff Present Renita Papa, RN BSN;Joseph Copeland, RCP,RRT,BSRT;Kara Triumph, MS, ASCM CEP, Exercise Physiologist    Virtual Visit No    Medication changes reported     No    Fall or balance concerns reported    No    Warm-up and Cool-down Performed on first and last piece of equipment    Resistance Training Performed Yes    VAD Patient? No    PAD/SET Patient? No      Pain Assessment   Currently in Pain? No/denies                Social History   Tobacco Use  Smoking Status Former   Packs/day: 0.50   Years: 10.00   Pack years: 5.00   Types: Cigarettes   Quit date: 04/08/2015   Years since quitting: 5.5  Smokeless Tobacco Never    Goals Met:  Independence with exercise equipment Exercise tolerated well No report of cardiac concerns or symptoms Strength training completed today  Goals Unmet:  Not Applicable  Comments: Pt able to follow exercise prescription today without complaint.  Will continue to monitor for progression.    Dr. Emily Filbert is Medical Director for [place].  Dr. Ottie Glazier is Medical Director for [facility] Pulmonary Rehabilitation.

## 2020-10-22 ENCOUNTER — Other Ambulatory Visit: Payer: Self-pay

## 2020-10-22 ENCOUNTER — Ambulatory Visit: Payer: No Typology Code available for payment source

## 2020-10-22 DIAGNOSIS — I252 Old myocardial infarction: Secondary | ICD-10-CM | POA: Diagnosis not present

## 2020-10-22 DIAGNOSIS — I214 Non-ST elevation (NSTEMI) myocardial infarction: Secondary | ICD-10-CM

## 2020-10-22 NOTE — Progress Notes (Signed)
Daily Session Note  Patient Details  Name: Todd Kelley MRN: [id number] Date of Birth: 1965-03-25 Referring Provider:   Flowsheet Row Cardiac Rehab from 08/19/2020 in [facility] Cardiac and Pulmonary Rehab  Referring Provider Kathlyn Sacramento MD  [facility] Cardiologist: Dr. Ida Rogue       Encounter Date: 10/22/2020  Check In:  Session Check In - 10/22/20 1722       Check-In   Supervising physician immediately available to respond to emergencies See telemetry face sheet for immediately available ER MD    Location [facility]-Cardiac & Pulmonary Rehab    Staff Present Birdie Sons, MPA, Nino Glow, MS, ASCM CEP, Exercise Physiologist;Joseph Tessie Fass, [place]    Virtual Visit No    Medication changes reported     No    Fall or balance concerns reported    No    Tobacco Cessation No Change    Warm-up and Cool-down Performed on first and last piece of equipment    Resistance Training Performed Yes    VAD Patient? No    PAD/SET Patient? No      Pain Assessment   Currently in Pain? No/denies                Social History   Tobacco Use  Smoking Status Former   Packs/day: 0.50   Years: 10.00   Pack years: 5.00   Types: Cigarettes   Quit date: 04/08/2015   Years since quitting: 5.5  Smokeless Tobacco Never    Goals Met:  Independence with exercise equipment Exercise tolerated well No report of cardiac concerns or symptoms Strength training completed today  Goals Unmet:  Not Applicable  Comments: Pt able to follow exercise prescription today without complaint.  Will continue to monitor for progression.    Dr. Emily Filbert is Medical Director for [place].  Dr. Ottie Glazier is Medical Director for [facility] Pulmonary Rehabilitation.

## 2020-10-23 ENCOUNTER — Encounter: Payer: No Typology Code available for payment source | Admitting: *Deleted

## 2020-10-23 DIAGNOSIS — I252 Old myocardial infarction: Secondary | ICD-10-CM | POA: Diagnosis not present

## 2020-10-23 DIAGNOSIS — I214 Non-ST elevation (NSTEMI) myocardial infarction: Secondary | ICD-10-CM

## 2020-10-23 NOTE — Progress Notes (Signed)
Daily Session Note  Patient Details  Name: Todd Kelley MRN: [id number] Date of Birth: January 02, 1966 Referring Provider:   Flowsheet Row Cardiac Rehab from 08/19/2020 in [facility] Cardiac and Pulmonary Rehab  Referring Provider Kathlyn Sacramento MD  [facility] Cardiologist: Dr. Ida Rogue       Encounter Date: 10/23/2020  Check In:  Session Check In - 10/23/20 1714       Check-In   Supervising physician immediately available to respond to emergencies See telemetry face sheet for immediately available ER MD    Location [facility]-Cardiac & Pulmonary Rehab    Staff Present Renita Papa, RN BSN;Joseph [place], RCP,RRT,BSRT;Kara [place], MS, ASCM CEP, Exercise Physiologist    Virtual Visit No    Medication changes reported     No    Fall or balance concerns reported    No    Warm-up and Cool-down Performed on first and last piece of equipment    Resistance Training Performed Yes    VAD Patient? No    PAD/SET Patient? No      Pain Assessment   Currently in Pain? No/denies                Social History   Tobacco Use  Smoking Status Former   Packs/day: 0.50   Years: 10.00   Pack years: 5.00   Types: Cigarettes   Quit date: 04/08/2015   Years since quitting: 5.5  Smokeless Tobacco Never    Goals Met:  Independence with exercise equipment Exercise tolerated well No report of concerns or symptoms today Strength training completed today  Goals Unmet:  Not Applicable  Comments: Pt able to follow exercise prescription today without complaint.  Will continue to monitor for progression.    Dr. Emily Filbert is Medical Director for [place].  Dr. Ottie Glazier is Medical Director for [facility] Pulmonary Rehabilitation.

## 2020-10-24 ENCOUNTER — Ambulatory Visit: Payer: No Typology Code available for payment source

## 2020-10-27 ENCOUNTER — Encounter: Payer: No Typology Code available for payment source | Admitting: *Deleted

## 2020-10-27 ENCOUNTER — Other Ambulatory Visit: Payer: Self-pay

## 2020-10-27 ENCOUNTER — Ambulatory Visit: Payer: No Typology Code available for payment source

## 2020-10-27 DIAGNOSIS — I214 Non-ST elevation (NSTEMI) myocardial infarction: Secondary | ICD-10-CM

## 2020-10-27 DIAGNOSIS — I252 Old myocardial infarction: Secondary | ICD-10-CM | POA: Diagnosis not present

## 2020-10-27 NOTE — Progress Notes (Signed)
Daily Session Note  Patient Details  Name: Todd Kelley MRN: [id number] Date of Birth: [birth year] Referring Provider:   Flowsheet Row Cardiac Rehab from 08/19/2020 in [facility] Cardiac and Pulmonary Rehab  Referring Provider Kathlyn Sacramento MD  [facility] Cardiologist: Dr. Ida Rogue       Encounter Date: 10/27/2020  Check In:  Session Check In - 10/27/20 1715       Check-In   Supervising physician immediately available to respond to emergencies See telemetry face sheet for immediately available ER MD    Location [facility]-Cardiac & Pulmonary Rehab    Staff Present Renita Papa, RN BSN;Joseph [place], RCP,RRT,BSRT;Kara [place], MS, ASCM CEP, Exercise Physiologist    Virtual Visit No    Medication changes reported     No    Fall or balance concerns reported    No    Warm-up and Cool-down Performed on first and last piece of equipment    Resistance Training Performed Yes    VAD Patient? No    PAD/SET Patient? No      Pain Assessment   Currently in Pain? No/denies                Social History   Tobacco Use  Smoking Status Former   Packs/day: 0.50   Years: 10.00   Pack years: 5.00   Types: Cigarettes   Quit date: 04/08/2015   Years since quitting: 5.5  Smokeless Tobacco Never    Goals Met:  Independence with exercise equipment Exercise tolerated well No report of concerns or symptoms today Strength training completed today  Goals Unmet:  Not Applicable  Comments: Pt able to follow exercise prescription today without complaint.  Will continue to monitor for progression.    Dr. Emily Filbert is Medical Director for [place].  Dr. Ottie Glazier is Medical Director for [facility] Pulmonary Rehabilitation.

## 2020-10-29 ENCOUNTER — Encounter: Payer: No Typology Code available for payment source | Admitting: *Deleted

## 2020-10-29 ENCOUNTER — Other Ambulatory Visit: Payer: Self-pay

## 2020-10-29 ENCOUNTER — Ambulatory Visit: Payer: No Typology Code available for payment source

## 2020-10-29 DIAGNOSIS — I214 Non-ST elevation (NSTEMI) myocardial infarction: Secondary | ICD-10-CM

## 2020-10-29 DIAGNOSIS — I252 Old myocardial infarction: Secondary | ICD-10-CM | POA: Diagnosis not present

## 2020-10-29 NOTE — Progress Notes (Signed)
Daily Session Note  Patient Details  Name: Todd Kelley MRN: [id number] Date of Birth: 06/15/1965 Referring Provider:   Flowsheet Row Cardiac Rehab from 08/19/2020 in [facility] Cardiac and Pulmonary Rehab  Referring Provider Kathlyn Sacramento MD  [facility] Cardiologist: Dr. Ida Rogue       Encounter Date: 10/29/2020  Check In:  Session Check In - 10/29/20 1712       Check-In   Supervising physician immediately available to respond to emergencies See telemetry face sheet for immediately available ER MD    Location [facility]-Cardiac & Pulmonary Rehab    Staff Present Renita Papa, RN BSN;Joseph Tessie Fass, RCP,RRT,BSRT;Melissa Victor, [place], LDN    Virtual Visit No    Medication changes reported     No    Fall or balance concerns reported    No    Warm-up and Cool-down Performed on first and last piece of equipment    Resistance Training Performed Yes    VAD Patient? No    PAD/SET Patient? No      Pain Assessment   Currently in Pain? No/denies                Social History   Tobacco Use  Smoking Status Former   Packs/day: 0.50   Years: 10.00   Pack years: 5.00   Types: Cigarettes   Quit date: 04/08/2015   Years since quitting: 5.5  Smokeless Tobacco Never    Goals Met:  Independence with exercise equipment Exercise tolerated well No report of concerns or symptoms today Strength training completed today  Goals Unmet:  Not Applicable  Comments: Pt able to follow exercise prescription today without complaint.  Will continue to monitor for progression.    Dr. Emily Filbert is Medical Director for [place].  Dr. Ottie Glazier is Medical Director for [facility] Pulmonary Rehabilitation.

## 2020-10-30 ENCOUNTER — Encounter: Payer: No Typology Code available for payment source | Attending: Cardiovascular Disease | Admitting: *Deleted

## 2020-10-30 DIAGNOSIS — I214 Non-ST elevation (NSTEMI) myocardial infarction: Secondary | ICD-10-CM | POA: Diagnosis present

## 2020-10-30 NOTE — Progress Notes (Signed)
Daily Session Note  Patient Details  Name: BERTIS HUSTEAD MRN: [id number] Date of Birth: February 21, 1966 Referring Provider:   Flowsheet Row Cardiac Rehab from 08/19/2020 in [facility] Cardiac and Pulmonary Rehab  Referring Provider Kathlyn Sacramento MD  [facility] Cardiologist: Dr. Ida Rogue       Encounter Date: 10/30/2020  Check In:  Session Check In - 10/30/20 1713       Check-In   Supervising physician immediately available to respond to emergencies See telemetry face sheet for immediately available ER MD    Location [facility]-Cardiac & Pulmonary Rehab    Staff Present Renita Papa, RN BSN;Joseph Tessie Fass, [place]    Virtual Visit No    Medication changes reported     No    Fall or balance concerns reported    No    Warm-up and Cool-down Performed on first and last piece of equipment    Resistance Training Performed Yes    VAD Patient? No    PAD/SET Patient? No      Pain Assessment   Currently in Pain? No/denies                Social History   Tobacco Use  Smoking Status Former   Packs/day: 0.50   Years: 10.00   Pack years: 5.00   Types: Cigarettes   Quit date: 04/08/2015   Years since quitting: 5.5  Smokeless Tobacco Never    Goals Met:  Independence with exercise equipment Exercise tolerated well No report of concerns or symptoms today Strength training completed today  Goals Unmet:  Not Applicable  Comments: Pt able to follow exercise prescription today without complaint.  Will continue to monitor for progression.    Dr. Emily Filbert is Medical Director for [place].  Dr. Ottie Glazier is Medical Director for [facility] Pulmonary Rehabilitation.

## 2020-10-31 ENCOUNTER — Ambulatory Visit: Payer: No Typology Code available for payment source

## 2020-11-04 ENCOUNTER — Other Ambulatory Visit: Payer: Self-pay | Admitting: Medical

## 2020-11-05 ENCOUNTER — Encounter: Payer: Self-pay | Admitting: *Deleted

## 2020-11-05 ENCOUNTER — Ambulatory Visit: Payer: No Typology Code available for payment source

## 2020-11-05 ENCOUNTER — Encounter: Payer: No Typology Code available for payment source | Admitting: *Deleted

## 2020-11-05 ENCOUNTER — Other Ambulatory Visit: Payer: Self-pay

## 2020-11-05 DIAGNOSIS — I214 Non-ST elevation (NSTEMI) myocardial infarction: Secondary | ICD-10-CM | POA: Diagnosis not present

## 2020-11-05 NOTE — Progress Notes (Signed)
Cardiac Individual Treatment Plan  Patient Details  Name: Todd Kelley MRN: [id number] Date of Birth: 1966/02/04 Referring Provider:   Flowsheet Row Cardiac Rehab from 08/19/2020 in [facility] Cardiac and Pulmonary Rehab  Referring Provider Kathlyn Sacramento MD  [facility] Cardiologist: Dr. Ida Rogue       Initial Encounter Date:  Flowsheet Row Cardiac Rehab from 08/19/2020 in [facility] Cardiac and Pulmonary Rehab  Date 08/19/20       Visit Diagnosis: NSTEMI (non-ST elevated myocardial infarction) [facility])  Patient's Home Medications on Admission:  Current Outpatient Medications:    aspirin (ASPIRIN LOW DOSE) 81 MG EC tablet, Take 1 tablet (81 mg total) by mouth daily. Swallow whole., Disp: 90 tablet, Rfl: 3   atorvastatin (LIPITOR) 10 MG tablet, Take 1 tablet (10 mg total) by mouth daily., Disp: 30 tablet, Rfl: 11   cetirizine (ZYRTEC) 10 MG tablet, Take 10 mg by mouth daily., Disp: , Rfl:    clopidogrel (PLAVIX) 75 MG tablet, Take 1 tablet (75 mg total) by mouth daily with breakfast., Disp: 30 tablet, Rfl: 11   Continuous Blood Gluc Receiver (DEXCOM G6 RECEIVER) DEVI, Use as instructed, Disp: 1 each, Rfl: 0   Continuous Blood Gluc Sensor (DEXCOM G6 SENSOR) MISC, Use for every 14 days., Disp: 3 each, Rfl: 2   Continuous Blood Gluc Transmit (DEXCOM G6 TRANSMITTER) MISC, USE AS INSTRUCTED, Disp: 1 each, Rfl: 0   escitalopram (LEXAPRO) 10 MG tablet, Take 1 tablet (10 mg total) by mouth daily., Disp: 90 tablet, Rfl: 3   ezetimibe (ZETIA) 10 MG tablet, Take 1 tablet (10 mg total) by mouth daily., Disp: 30 tablet, Rfl: 11   fenofibrate (TRICOR) 145 MG tablet, TAKE 1 TABLET BY MOUTH  DAILY, Disp: 90 tablet, Rfl: 0   JARDIANCE 10 MG TABS tablet, TAKE 1 TABLET BY MOUTH DAILY BEFORE BREAKFAST., Disp: 30 tablet, Rfl: 2   levothyroxine (SYNTHROID) 150 MCG tablet, Take 1 tablet (150 mcg total) by mouth daily before breakfast., Disp: 90 tablet, Rfl: 1   metFORMIN (GLUCOPHAGE XR) 500 MG 24 hr tablet, Take 1  tablet (500 mg total) by mouth daily with breakfast., Disp: 30 tablet, Rfl: 11   Multiple Vitamins-Minerals (MULTIVITAMIN ADULT) CHEW, Chew 1 tablet by mouth daily., Disp: , Rfl:    nitroGLYCERIN (NITROSTAT) 0.4 MG SL tablet, Place 1 tablet (0.4 mg total) under the tongue every 5 (five) minutes as needed for chest pain., Disp: 25 tablet, Rfl: 4   potassium chloride (KLOR-CON) 10 MEQ tablet, Take 1 tablet (10 mEq total) by mouth daily., Disp: 90 tablet, Rfl: 3   PROAIR RESPICLICK 549 (90 Base) MCG/ACT AEPB, USE 2 INHALATIONS BY MOUTH  EVERY 6 HOURS AS NEEDED, Disp: 1 each, Rfl: 0   sacubitril-valsartan (ENTRESTO) 24-26 MG, Take 1 tablet by mouth 2 (two) times daily., Disp: 60 tablet, Rfl: 11   torsemide (DEMADEX) 20 MG tablet, Take 1 tablet (20 mg) by mouth once daily in the morning, you may take an extra 1 tablet (20 mg) in the afternoon for abdominal swelling, Disp: 135 tablet, Rfl: 1  Past Medical History: Past Medical History:  Diagnosis Date   Acid reflux    Allergic asthma    rare flare up, worse with alllergies as of 12/2016   Coronary artery disease    a. 08/1998 Lateral MI/PCI/BMS [place]; b. 2001 PTCA x 2 2/2 ISR in [place]; c. 2002 PCI/brachyrx 2/2 ISR in [place]; d. 01/2001 CABG x 4: LIMA->LAD, RIMA->RI, VG->RPL->RPDA; e. 01/2014 MV: Low risk w/ attenuation artifact  and mild inf basal isch. Nl EF->Med rx.   DVT (deep venous thrombosis) ([place])    a. 01/2001 after CABG.   Erectile dysfunction    Hyperlipidemia LDL goal <70    Hypertension    Hypothyroidism    Palpitations    Pericardial effusion    a. 01/2001 Effusion w/ tamponade req window following CABG.   Tobacco use    vape, no cigarrette since 04/2015   Type II diabetes mellitus ([place])     Tobacco Use: Social History   Tobacco Use  Smoking Status Former   Packs/day: 0.50   Years: 10.00   Pack years: 5.00   Types: Cigarettes   Quit date: 04/08/2015   Years since quitting: 5.5  Smokeless Tobacco Never    Labs: Recent Review  Flowsheet Data     Labs for ITP Cardiac and Pulmonary Rehab Latest Ref Rng & Units 12/27/2019 03/14/2020 07/25/2020 07/26/2020 09/15/2020   Cholestrol 0 - 200 mg/dL 137 125 - 207(H) 113   LDLCALC 0 - 99 mg/dL 80 70 - 138(H) 67   LDLDIRECT 0 - 99 mg/dL - - - - 68.4   HDL >40 mg/dL 37(L) 39(L) - 41 33(L)   Trlycerides <150 mg/dL 105 83 - 141 63   Hemoglobin A1c 4.8 - 5.6 % 7.2(A) 7.2(H) 7.4(H) - 7.3(H)        Exercise Target Goals: Exercise Program Goal: Individual exercise prescription set using results from initial 6 min walk test and THRR while considering  patient's activity barriers and safety.   Exercise Prescription Goal: Initial exercise prescription builds to 30-45 minutes a day of aerobic activity, 2-3 days per week.  Home exercise guidelines will be given to patient during program as part of exercise prescription that the participant will acknowledge.   Education: Aerobic Exercise: - Group verbal and visual presentation on the components of exercise prescription. Introduces F.I.T.T principle from ACSM for exercise prescriptions.  Reviews F.I.T.T. principles of aerobic exercise including progression. Written material given at graduation.   Education: Resistance Exercise: - Group verbal and visual presentation on the components of exercise prescription. Introduces F.I.T.T principle from ACSM for exercise prescriptions  Reviews F.I.T.T. principles of resistance exercise including progression. Written material given at graduation.    Education: Exercise & Equipment Safety: - Individual verbal instruction and demonstration of equipment use and safety with use of the equipment. Flowsheet Row Cardiac Rehab from 08/19/2020 in [facility] Cardiac and Pulmonary Rehab  Date 08/19/20  Educator [facility]  Instruction Review Code 1- Verbalizes Understanding       Education: Exercise Physiology & General Exercise Guidelines: - Group verbal and written instruction with models to review the exercise  physiology of the cardiovascular system and associated critical values. Provides general exercise guidelines with specific guidelines to those with heart or lung disease.    Education: Flexibility, Balance, Mind/Body Relaxation: - Group verbal and visual presentation with interactive activity on the components of exercise prescription. Introduces F.I.T.T principle from ACSM for exercise prescriptions. Reviews F.I.T.T. principles of flexibility and balance exercise training including progression. Also discusses the mind body connection.  Reviews various relaxation techniques to help reduce and manage stress (i.e. Deep breathing, progressive muscle relaxation, and visualization). Balance handout provided to take home. Written material given at graduation.   Activity Barriers & Risk Stratification:   6 Minute Walk:   Oxygen Initial Assessment:   Oxygen Re-Evaluation:   Oxygen Discharge (Final Oxygen Re-Evaluation):   Initial Exercise Prescription:   Perform Capillary Blood Glucose checks as  needed.  Exercise Prescription Changes:   Exercise Prescription Changes     Row Name 09/04/20 0900 09/17/20 1500 09/22/20 1800 10/01/20 1100 10/16/20 0800     Response to Exercise   Blood Pressure (Admit) 110/60 122/68 -- 102/56 102/60   Blood Pressure (Exercise) 150/58 138/64 -- 110/60 --   Blood Pressure (Exit) 102/60 118/64 -- 116/62 108/64   Heart Rate (Admit) 60 bpm 78 bpm -- 63 bpm 57 bpm   Heart Rate (Exercise) 117 bpm 102 bpm -- 107 bpm 111 bpm   Heart Rate (Exit) 82 bpm 64 bpm -- 66 bpm 63 bpm   Rating of Perceived Exertion (Exercise) 12 14 -- 14 14   Symptoms none none -- none none   Comments first full week of exercise -- -- -- --   Duration Continue with 30 min of aerobic exercise without signs/symptoms of physical distress. Continue with 30 min of aerobic exercise without signs/symptoms of physical distress. -- Continue with 30 min of aerobic exercise without signs/symptoms of  physical distress. Continue with 30 min of aerobic exercise without signs/symptoms of physical distress.   Intensity THRR unchanged THRR unchanged -- THRR unchanged THRR unchanged     Progression   Progression Continue to progress workloads to maintain intensity without signs/symptoms of physical distress. Continue to progress workloads to maintain intensity without signs/symptoms of physical distress. -- Continue to progress workloads to maintain intensity without signs/symptoms of physical distress. Continue to progress workloads to maintain intensity without signs/symptoms of physical distress.   Average METs 5.34 8.88 -- 9.3 7.2     Resistance Training   Training Prescription Yes Yes -- Yes Yes   Weight 5 lb 5 lb -- 5 lb 12 lb   Reps 10-15 10-15 -- 10-15 10-15     Interval Training   Interval Training No No -- No No     Treadmill   MPH 2.7 3.7 -- 3.8 3.7   Grade 15 15 -- 15 9   Minutes 15 15 -- 15 15   METs 8.6 11.5 -- 11.8 8.4     Recumbant Bike   Level 5 -- -- -- 7   [place]atts 57 -- -- -- 53   Minutes 15 -- -- -- 15   METs 3.63 -- -- -- 3.64     NuStep   Level -- -- -- -- 6   Minutes -- -- -- -- 15   METs -- -- -- -- 5.3     REL-XR   Level -- 8 -- 8 8   Minutes -- 15 -- 15 15   METs -- 6.1 -- 6.7 6.2     Home Exercise Plan   Plans to continue exercise at -- -- [employer] (comment)  [place] (comment)  [employer]   Frequency -- -- Add 3 additional days to program exercise sessions.  Start with 1 day -- Add 3 additional days to program exercise sessions.  Start with 1 day   Initial Home Exercises Provided -- -- 09/22/20 -- 09/22/20    Row Name 10/28/20 1400             Response to Exercise   Blood Pressure (Admit) 118/60       Blood Pressure (Exit) 122/72       Heart Rate (Admit) 59 bpm       Heart Rate (Exercise) 105 bpm       Heart Rate (Exit) 74 bpm  Rating of Perceived Exertion (Exercise) 14       Symptoms none        Duration Continue with 30 min of aerobic exercise without signs/symptoms of physical distress.       Intensity THRR unchanged               Progression   Progression Continue to progress workloads to maintain intensity without signs/symptoms of physical distress.       Average METs 7.1               Resistance Training   Training Prescription Yes       Weight 12 lb       Reps 10-15               Interval Training   Interval Training No               Treadmill   MPH 3.3       Grade 9       Minutes 15       METs 7.62  intervals 6-12 %               Home Exercise Plan   Plans to continue exercise at [employer] (comment)  [employer]       Frequency Add 3 additional days to program exercise sessions.  Start with 1 day       Initial Home Exercises Provided 09/22/20                Exercise Comments:   Exercise Comments     Row Name 08/25/20 0739 08/25/20 0932         Exercise Comments DIscharged Ison called to state he has a new job and will not be able to attend the program DIscharged Jonavin called to state he has a new job and will not be able to attend the program   ERROR   wrong patient                                                                      First full day of exercise!  Patient was oriented to gym and equipment including functions, settings, policies, and procedures.  Patient's individual exercise prescription and treatment plan were reviewed.  All starting workloads were established based on the results of the 6 minute walk test done at initial orientation visit.  The plan for exercise progression was also introduced and progression will be customized based on patient's performance and goals.               Exercise Goals and Review:   Exercise Goals Re-Evaluation :  Exercise Goals Re-Evaluation     Row Name 08/25/20 0934 09/04/20 0953 09/17/20 1552 09/22/20 1729 10/01/20 1125     Exercise Goal Re-Evaluation   Exercise Goals Review  Able to understand and use rate of perceived exertion (RPE) scale;Knowledge and understanding of Target Heart Rate Range (THRR);Able to understand and use Dyspnea scale;Understanding of Exercise Prescription Increase Physical Activity;Increase Strength and Stamina Increase Physical Activity;Increase Strength and Stamina;Understanding of Exercise Prescription Increase Physical Activity;Increase Strength and Stamina;Understanding of Exercise Prescription Increase Physical Activity;Increase Strength and Stamina   Comments Reviewed RPE and dyspnea scales, THR and program  prescription with pt today.  Pt voiced understanding and was given a copy of goals to take home. Forney is starting off well for his first couple weeks of exercise. He has already increased to 5 lb handweights and increased his incline on the Treadmill to 15%. Will Continue to monitor Eloise is doing well in rehab.  He is up to level 8 on the XR. We will encourage him to increase his weights and continue to montior his progress. Reviewed home exercise with pt today.  Pt plans to walk and join [employer] for exercise.  Reviewed THR, pulse, RPE, sign and symptoms, pulse oximetery and when to call 911 or MD.  Also discussed weather considerations and indoor options.  Pt voiced understanding. Raun is progressing well. he has increased speed and grade on TM.  Staff will continue to monitor progress.   Expected Outcomes Short: Use RPE daily to regulate intensity. Long: Follow program prescription in THR. Short: Continue to build up speed on treadmill Long: Increase overall MET level Short: Try 6 lb handweights Long: continue to improve stamina Short: Add on 1 day of exercise at home and start checking HR Long: Continue to exercise independently at appropriate prescription Short: maintain consistent attendance Long: continue to build stamina    Row Name 10/16/20 0856 10/27/20 1731           Exercise Goal Re-Evaluation   Exercise Goals Review Increase  Physical Activity;Increase Strength and Stamina Understanding of Exercise Prescription      Comments Mohsen is doing very well in rehab. He is now up to level 7 on the recumbant bike and increased his handweights to 12 lb. Staff should introduce intervals for patient- we will continue to monitor progress. Bird states that he is not doing much exercise at home like he should be. His plan is to join a gym when he is done with [facility]. Informed him of [facility] which is down the road from him.      Expected Outcomes Short: Try intervals on the treadmill Long: Continue to increase overall MET level Short: join a gym. Long: maintain working out independently.               Discharge Exercise Prescription (Final Exercise Prescription Changes):  Exercise Prescription Changes - 10/28/20 1400       Response to Exercise   Blood Pressure (Admit) 118/60    Blood Pressure (Exit) 122/72    Heart Rate (Admit) 59 bpm    Heart Rate (Exercise) 105 bpm    Heart Rate (Exit) 74 bpm    Rating of Perceived Exertion (Exercise) 14    Symptoms none    Duration Continue with 30 min of aerobic exercise without signs/symptoms of physical distress.    Intensity THRR unchanged      Progression   Progression Continue to progress workloads to maintain intensity without signs/symptoms of physical distress.    Average METs 7.1      Resistance Training   Training Prescription Yes    Weight 12 lb    Reps 10-15      Interval Training   Interval Training No      Treadmill   MPH 3.3    Grade 9    Minutes 15    METs 7.62   intervals 6-12 %     Home Exercise Plan   Plans to continue exercise at [employer] (comment)   [employer]   Frequency Add 3 additional days to program exercise sessions.  Start with 1 day   Initial Home Exercises Provided 09/22/20             Nutrition:  Target Goals: Understanding of nutrition guidelines, daily intake of sodium <1554m, cholesterol <2029m  calories 30% from fat and 7% or less from saturated fats, daily to have 5 or more servings of fruits and vegetables.  Education: All About Nutrition: -Group instruction provided by verbal, written material, interactive activities, discussions, models, and posters to present general guidelines for heart healthy nutrition including fat, fiber, MyPlate, the role of sodium in heart healthy nutrition, utilization of the nutrition label, and utilization of this knowledge for meal planning. Follow up email sent as well. Written material given at graduation. [facility]  Education need identified 08/19/20       Biometrics:    Nutrition Therapy Plan and Nutrition Goals:  Nutrition Therapy & Goals - 09/25/20 1649       Nutrition Therapy   Diet Heart healthy, low Na, diabetes friendly    Drug/Food Interactions Statins/Certain Fruits    Protein (specify units) 75g    Fiber 30 grams    Whole Grain Foods 3 servings    Saturated Fats 12 max. grams    Fruits and Vegetables 8 servings/day    Sodium 1.5 grams      Personal Nutrition Goals   Nutrition Goal ST: Practice MyPlate and CHO counting (2-3 CHO per snack, 1-2 CHO per snack), CHO with protein and fat LT: 8 fruits and vegetables per day, a rainbow of fruits and vegetables per week, maintain BG during the day    Comments Will drink diet soda. B: triple zero yogurt, activia yogurt (helps with reflux), protein bar  - wants to fix 2-3 scrambled eggs with toast and yogurt S: protein bar L: 2 packs of tuna, crackers, sugar free pudding S: handful on nuts D: variety - last night grilled cheese with tomato soup. He likes salmon and catfish, rarely eats red meat, will eat baked chicken. Will sometimes have mashed potatoes, rice (flavored rice packets), vegetables. Uses Mrs. Dash. Butter or pam. Drinks: tea and lemonade sugar free packets, coffee (sugar free creamer). Discussed heart healthy  eating and diabetes friendly eating.      Intervention Plan   Intervention Prescribe, educate and counsel regarding individualized specific dietary modifications aiming towards targeted core components such as weight, hypertension, lipid management, diabetes, heart failure and other comorbidities.    Expected Outcomes Short Term Goal: Understand basic principles of dietary content, such as calories, fat, sodium, cholesterol and nutrients.;Short Term Goal: A plan has been developed with personal nutrition goals set during dietitian appointment.;Long Term Goal: Adherence to prescribed nutrition plan.             Nutrition Assessments:  MEDIFICTS Score Key: ?70 Need to make dietary changes  40-70 Heart Healthy Diet ? 40 Therapeutic Level Cholesterol Diet  [facility]  Picture Your Plate Total Score on Admission 67      Picture Your Plate Scores: <4<50nhealthy dietary pattern with much room for improvement. 41-50 Dietary pattern unlikely to meet recommendations for good health and room for improvement. 51-60 More healthful dietary pattern, with some room for improvement.  >60 Healthy dietary pattern, although there may be some specific behaviors that could be improved.    Nutrition Goals Re-Evaluation:  Nutrition Goals Re-Evaluation     RoHeimdalame 09/22/20  1801 10/27/20 1725           Goals   Current Weight -- 210 lb (95.3 kg)      Nutrition Goal -- Eat dinner earlier.      Comment Kiran has not met with the RD to establish goals. He has an appointment with her this Thursday, 7/28. Hason wants to lose weight and the only thing he can think of to do is eat dinner earlier. He watches his sugars being a diabetic.      Expected Outcome Short: Meet with RD Long: Continue to follow heart healthy diet Short: eat smaller meals. Long: maintain weight loss.               Nutrition Goals Discharge (Final Nutrition  Goals Re-Evaluation):  Nutrition Goals Re-Evaluation - 10/27/20 1725       Goals   Current Weight 210 lb (95.3 kg)    Nutrition Goal Eat dinner earlier.    Comment [name] wants to lose weight and the only thing he can think of to do is eat dinner earlier. He watches his sugars being a diabetic.    Expected Outcome Short: eat smaller meals. Long: maintain weight loss.             Psychosocial: Target Goals: Acknowledge presence or absence of significant depression and/or stress, maximize coping skills, provide positive support system. Participant is able to verbalize types and ability to use techniques and skills needed for reducing stress and depression.   Education: Stress, Anxiety, and Depression - Group verbal and visual presentation to define topics covered.  Reviews how body is impacted by stress, anxiety, and depression.  Also discusses healthy ways to reduce stress and to treat/manage anxiety and depression.  Written material given at graduation.   Education: Sleep Hygiene -Provides group verbal and written instruction about how sleep can affect your health.  Define sleep hygiene, discuss sleep cycles and impact of sleep habits. Review good sleep hygiene tips.    Initial Review & Psychosocial Screening:   Quality of Life Scores:   Scores of 19 and below usually indicate a poorer quality of life in these areas.  A difference of  2-3 points is a clinically meaningful difference.  A difference of 2-3 points in the total score of the Quality of Life Index has been associated with significant improvement in overall quality of life, self-image, physical symptoms, and general health in studies assessing change in quality of life.  PHQ-9: Recent Review Flowsheet Data     Depression screen [facility] 2/9 08/19/2020 03/14/2020 03/09/2019 01/16/2018 01/14/2017   Decreased Interest 1 0 0 0 0   Down, Depressed, Hopeless 0 0 0 0 0   PHQ - 2 Score 1 0 0 0 0   Altered sleeping 0 - - - -   Tired,  decreased energy 1 - - - -   Change in appetite 0 - - - -   Feeling bad or failure about yourself  0 - - - -   Trouble concentrating 0 - - - -   Moving slowly or fidgety/restless 0 - - - -   Suicidal thoughts 0 - - - -   PHQ-9 Score 2 - - - -   Difficult doing work/chores Not difficult at all - - - -      Interpretation of Total Score  Total Score Depression Severity:  1-4 = Minimal depression, 5-9 = Mild depression, 10-14 = Moderate depression, 15-19 = Moderately severe depression, 20-27 =  Severe depression   Psychosocial Evaluation and Intervention:   Psychosocial Re-Evaluation:  Psychosocial Re-Evaluation     Row Name 09/22/20 1747 10/27/20 1729           Psychosocial Re-Evaluation   Current issues with Current Stress Concerns Current Sleep Concerns      Comments Briston is doing very well mentally. He did his get his heart monitor and doctor told him there were no major concerns. He feels his palpitations have subsided and agreed to contact doctor if he starts to feel them again. He is still a little nervous exercising as he worries about his last heart event. He takes Lexapro at night and feels it helps. He has good support from family and friends. He just saw his cardiologist today and reports everything looked good and doesn't have to go back for another 6 months. Does not feel any need for other medications or therapy. Patient takes generic version of lexapro to help him sleep.Patient reports no issues with their current mental states, sleep, stress, depression or anxiety. Will follow up with patient in a few weeks for any changes.      Expected Outcomes Short: Continue attendance with rehab Long: Continue to utilize exercise for stress management Short: Continue to exercise regularly to support mental health and notify staff of any changes. Long: maintain mental health and well being through teaching of rehab or prescribed medications independently.      Interventions Encouraged  to attend Cardiac Rehabilitation for the exercise Encouraged to attend Cardiac Rehabilitation for the exercise      Continue Psychosocial Services  Follow up required by staff Follow up required by staff               Psychosocial Discharge (Final Psychosocial Re-Evaluation):  Psychosocial Re-Evaluation - 10/27/20 1729       Psychosocial Re-Evaluation   Current issues with Current Sleep Concerns    Comments Patient takes generic version of lexapro to help him sleep.Patient reports no issues with their current mental states, sleep, stress, depression or anxiety. Will follow up with patient in a few weeks for any changes.    Expected Outcomes Short: Continue to exercise regularly to support mental health and notify staff of any changes. Long: maintain mental health and well being through teaching of rehab or prescribed medications independently.    Interventions Encouraged to attend Cardiac Rehabilitation for the exercise    Continue Psychosocial Services  Follow up required by staff             Vocational Rehabilitation: Provide vocational rehab assistance to qualifying candidates.   Vocational Rehab Evaluation & Intervention:   Education: Education Goals: Education classes will be provided on a variety of topics geared toward better understanding of heart health and risk factor modification. Participant will state understanding/return demonstration of topics presented as noted by education test scores.  Learning Barriers/Preferences:   General Cardiac Education Topics:  AED/CPR: - Group verbal and written instruction with the use of models to demonstrate the basic use of the AED with the basic ABC's of resuscitation.   Anatomy and Cardiac Procedures: - Group verbal and visual presentation and models provide information about basic cardiac anatomy and function. Reviews the testing methods done to diagnose heart disease and the outcomes of the test results. Describes the  treatment choices: Medical Management, Angioplasty, or Coronary Bypass Surgery for treating various heart conditions including Myocardial Infarction, Angina, Valve Disease, and Cardiac Arrhythmias.  Written material given at graduation.   Medication  Safety: - Group verbal and visual instruction to review commonly prescribed medications for heart and lung disease. Reviews the medication, class of the drug, and side effects. Includes the steps to properly store meds and maintain the prescription regimen.  Written material given at graduation.   Intimacy: - Group verbal instruction through game format to discuss how heart and lung disease can affect sexual intimacy. Written material given at graduation..   Know Your Numbers and Heart Failure: - Group verbal and visual instruction to discuss disease risk factors for cardiac and pulmonary disease and treatment options.  Reviews associated critical values for Overweight/Obesity, Hypertension, Cholesterol, and Diabetes.  Discusses basics of heart failure: signs/symptoms and treatments.  Introduces Heart Failure Zone chart for action plan for heart failure.  Written material given at graduation. Flowsheet Row Cardiac Rehab from 08/19/2020 in [facility] Cardiac and Pulmonary Rehab  Education need identified 08/19/20       Infection Prevention: - Provides verbal and written material to individual with discussion of infection control including proper hand washing and proper equipment cleaning during exercise session. Flowsheet Row Cardiac Rehab from 08/19/2020 in [facility] Cardiac and Pulmonary Rehab  Date 08/19/20  Educator [facility]  Instruction Review Code 1- Verbalizes Understanding       Falls Prevention: - Provides verbal and written material to individual with discussion of falls prevention and safety. Flowsheet Row Cardiac Rehab from 08/19/2020 in [facility] Cardiac and Pulmonary Rehab  Date 08/19/20  Educator [facility]  Instruction Review Code 1- Verbalizes  Understanding       Other: -Provides group and verbal instruction on various topics (see comments)   Knowledge Questionnaire Score:   Core Components/Risk Factors/Patient Goals at Admission:   Education:Diabetes - Individual verbal and written instruction to review signs/symptoms of diabetes, desired ranges of glucose level fasting, after meals and with exercise. Acknowledge that pre and post exercise glucose checks will be done for 3 sessions at entry of program. [place] from 08/19/2020 in [facility] Cardiac and Pulmonary Rehab  Date 08/11/20  Educator [facility]  Instruction Review Code 1- Verbalizes Understanding       Core Components/Risk Factors/Patient Goals Review:   Goals and Risk Factor Review     Row Name 09/22/20 1742 10/27/20 1727           Core Components/Risk Factors/Patient Goals Review   Personal Goals Review Weight Management/Obesity;Hypertension;Diabetes;Lipids Weight Management/Obesity;Hypertension      Review Jaziah is doing well. He just saw cardiologist today and reports everything went well. He is checking his weight every morning - right now he is 210 lb- wants to get down to 195 lb. He is meeting with the RD soon to discuss goals. Dominica Severin checks BP routinely which ranges between 263Z systolic and  85/88F diastolic. Staying compliant withall  medications. He checks sugars at [facility] morning which have been stable. Reports 100-120 fasting. He notices a spike when he eats heavy breads or biscuits. He is going to meet with the RD this Thursday to learn about a diabetic friendly diet. Last good newd he heard about his cholesterol was his total cholesterol was 113! Kalee states that his blood pressure has been good at home. His blood pressure has been good in the program thus far. He still wants to lose weight and get down to 195 pounds.      Expected Outcomes Short: Work on weight loss with appropriate diet changes Long: Continue to manage lifestyle risk  factors Short: lose more weight. Long: reach weight goal of  195 pounds.               Core Components/Risk Factors/Patient Goals at Discharge (Final Review):   Goals and Risk Factor Review - 10/27/20 1727       Core Components/Risk Factors/Patient Goals Review   Personal Goals Review Weight Management/Obesity;Hypertension    Review Huntley states that his blood pressure has been good at home. His blood pressure has been good in the program thus far. He still wants to lose weight and get down to 195 pounds.    Expected Outcomes Short: lose more weight. Long: reach weight goal of 195 pounds.             ITP Comments:  ITP Comments     Row Name 08/25/20 0738 08/25/20 0931 09/10/20 0912 10/08/20 0734 11/05/20 0636   ITP Comments Error  Taelor has not been diascharged.  Wrong Chart First full day of exercise!  Patient was oriented to gym and equipment including functions, settings, policies, and procedures.  Patient's individual exercise prescription and treatment plan were reviewed.  All starting workloads were established based on the results of the 6 minute walk test done at initial orientation visit.  The plan for exercise progression was also introduced and progression will be customized based on patient's performance and goals. 30 Day review completed. Medical Director ITP review done, changes made as directed, and signed approval by Medical Director. 30 Day review completed. Medical Director ITP review done, changes made as directed, and signed approval by Medical Director. 30 Day review completed. Medical Director ITP review done, changes made as directed, and signed approval by Medical Director.            Comments:

## 2020-11-05 NOTE — Progress Notes (Signed)
Daily Session Note  Patient Details  Name: Todd Kelley MRN: [id number] Date of Birth: 05/26/1965 Referring Provider:   Flowsheet Row Cardiac Rehab from 08/19/2020 in [facility] Cardiac and Pulmonary Rehab  Referring Provider Arida, Muhammad MD  [Primary Cardiologist: Dr. Timothy Gollan]       Encounter Date: 11/05/2020  Check In:      Social History   Tobacco Use  Smoking Status Former   Packs/day: 0.50   Years: 10.00   Pack years: 5.00   Types: Cigarettes   Quit date: 04/08/2015   Years since quitting: 5.5  Smokeless Tobacco Never    Goals Met:  Independence with exercise equipment Exercise tolerated well No report of concerns or symptoms today  Goals Unmet:  Not Applicable  Comments: .exg   Dr. Mark Miller is Medical Director for [facility].  Dr. Fuad Aleskerov is Medical Director for [facility]monary [facility]. 

## 2020-11-07 ENCOUNTER — Ambulatory Visit: Payer: No Typology Code available for payment source

## 2020-11-10 ENCOUNTER — Ambulatory Visit: Payer: No Typology Code available for payment source

## 2020-11-10 ENCOUNTER — Other Ambulatory Visit: Payer: Self-pay

## 2020-11-10 ENCOUNTER — Encounter: Payer: No Typology Code available for payment source | Admitting: *Deleted

## 2020-11-10 DIAGNOSIS — I214 Non-ST elevation (NSTEMI) myocardial infarction: Secondary | ICD-10-CM

## 2020-11-10 NOTE — Progress Notes (Signed)
Daily Session Note  Patient Details  Name: Todd Kelley MRN: [id number] Date of Birth: 1965-05-01 Referring Provider:   Flowsheet Row Cardiac Rehab from 08/19/2020 in [facility] Cardiac and Pulmonary Rehab  Referring Provider Kathlyn Sacramento MD  [facility] Cardiologist: Dr. Ida Rogue       Encounter Date: 11/10/2020  Check In:  Session Check In - 11/10/20 1717       Check-In   Supervising physician immediately available to respond to emergencies See telemetry face sheet for immediately available ER MD    Location [facility]-Cardiac & Pulmonary Rehab    Staff Present Renita Papa, RN BSN;Joseph [place], RCP,RRT,BSRT;Kara [place], MS, ASCM CEP, Exercise Physiologist    Virtual Visit No    Medication changes reported     No    Fall or balance concerns reported    No    Warm-up and Cool-down Performed on first and last piece of equipment    Resistance Training Performed Yes    VAD Patient? No    PAD/SET Patient? No      Pain Assessment   Currently in Pain? No/denies                Social History   Tobacco Use  Smoking Status Former   Packs/day: 0.50   Years: 10.00   Pack years: 5.00   Types: Cigarettes   Quit date: 04/08/2015   Years since quitting: 5.5  Smokeless Tobacco Never    Goals Met:  Independence with exercise equipment Exercise tolerated well No report of concerns or symptoms today Strength training completed today  Goals Unmet:  Not Applicable  Comments: Pt able to follow exercise prescription today without complaint.  Will continue to monitor for progression.    Dr. Emily Filbert is Medical Director for [facility].  Dr. Ottie Glazier is Medical Director for [facility] Pulmonary Rehabilitation.

## 2020-11-12 ENCOUNTER — Encounter: Payer: No Typology Code available for payment source | Admitting: *Deleted

## 2020-11-12 ENCOUNTER — Ambulatory Visit: Payer: No Typology Code available for payment source

## 2020-11-12 ENCOUNTER — Encounter: Payer: Self-pay | Admitting: Cardiology

## 2020-11-12 ENCOUNTER — Ambulatory Visit (INDEPENDENT_AMBULATORY_CARE_PROVIDER_SITE_OTHER): Payer: No Typology Code available for payment source | Admitting: Cardiology

## 2020-11-12 ENCOUNTER — Other Ambulatory Visit: Payer: Self-pay

## 2020-11-12 VITALS — BP 102/70 | HR 52 | Ht 69.5 in | Wt 211.0 lb

## 2020-11-12 DIAGNOSIS — I5042 Chronic combined systolic (congestive) and diastolic (congestive) heart failure: Secondary | ICD-10-CM

## 2020-11-12 DIAGNOSIS — I25118 Atherosclerotic heart disease of native coronary artery with other forms of angina pectoris: Secondary | ICD-10-CM | POA: Diagnosis not present

## 2020-11-12 DIAGNOSIS — R9431 Abnormal electrocardiogram [ECG] [EKG]: Secondary | ICD-10-CM | POA: Diagnosis not present

## 2020-11-12 DIAGNOSIS — I255 Ischemic cardiomyopathy: Secondary | ICD-10-CM

## 2020-11-12 DIAGNOSIS — I214 Non-ST elevation (NSTEMI) myocardial infarction: Secondary | ICD-10-CM | POA: Diagnosis not present

## 2020-11-12 DIAGNOSIS — I252 Old myocardial infarction: Secondary | ICD-10-CM

## 2020-11-12 MED ORDER — FENOFIBRATE 145 MG PO TABS: 145.0000 mg | ORAL_TABLET | Freq: Every day | ORAL | 1 refills | Status: DC

## 2020-11-12 NOTE — Progress Notes (Signed)
Daily Session Note  Patient Details  Name: Todd Kelley MRN: [id number] Date of Birth: 11/13/1965 Referring Provider:   Flowsheet Row Cardiac Rehab from 08/19/2020 in [facility] Cardiac and Pulmonary Rehab  Referring Provider Kathlyn Sacramento MD  [facility] Cardiologist: Dr. Ida Rogue       Encounter Date: 11/12/2020  Check In:  Session Check In - 11/12/20 1719       Check-In   Supervising physician immediately available to respond to emergencies See telemetry face sheet for immediately available ER MD    Location [facility]-Cardiac & Pulmonary Rehab    Staff Present Renita Papa, RN BSN;Joseph [place], RCP,RRT,BSRT;Kara [place], MS, ASCM CEP, Exercise Physiologist    Virtual Visit No    Medication changes reported     No    Fall or balance concerns reported    No    Warm-up and Cool-down Performed on first and last piece of equipment    Resistance Training Performed Yes    VAD Patient? No    PAD/SET Patient? No      Pain Assessment   Currently in Pain? No/denies                Social History   Tobacco Use  Smoking Status Former   Packs/day: 0.50   Years: 10.00   Pack years: 5.00   Types: Cigarettes   Quit date: 04/08/2015   Years since quitting: 5.6  Smokeless Tobacco Never    Goals Met:  Independence with exercise equipment Exercise tolerated well No report of concerns or symptoms today Strength training completed today  Goals Unmet:  Not Applicable  Comments: Pt able to follow exercise prescription today without complaint.  Will continue to monitor for progression.    Dr. Emily Filbert is Medical Director for [place].  Dr. Ottie Glazier is Medical Director for [facility] Pulmonary Rehabilitation.

## 2020-11-12 NOTE — Progress Notes (Signed)
Electrophysiology Office Note:    Date:  11/12/2020   ID:  [name], DOB Nov 05, 1965, MRN [id number]  PCP:  Carlena Hurl, PA-C  [facility] Cardiologist:  Ida Rogue, MD  [facility] Electrophysiologist:  Vickie Epley, MD   Referring MD: Arvil Chaco, PA*   Chief Complaint: abnormal monitor results  History of Present Illness:    Todd Kelley is a 55 y.o. male who presents for an evaluation of abnormal holter results at the request of Marrianne Mood, PA-C. Their medical history includes CAD s/p CABG in 2002, tobacco abuse, GERD, HTN, HLD and hypothyroidism.  He was seen by Marrianne Mood, PA-C on 08/29/2020. He was seen in June of this year with chest pain. A stress test was performed which was moderate risk and showed a small area of peri-infarct ischemia on the anterolateral wall.  Today he is doing well.  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  He is participating in cardiac rehab 3 times a week.  He tells me that he has improved significantly since starting cardiac rehab.  His max heart rate at rehab is about 105 bpm.  No syncope or presyncope.  Has felt the palpitations.    Past Medical History:  Diagnosis Date   Acid reflux    Allergic asthma    rare flare up, worse with alllergies as of 12/2016   Coronary artery disease    a. 08/1998 Lateral MI/PCI/BMS [place]; b. 2001 PTCA x 2 2/2 ISR in [place]; c. 2002 PCI/brachyrx 2/2 ISR in [place]; d. 01/2001 CABG x 4: LIMA->LAD, RIMA->RI, VG->RPL->RPDA; e. 01/2014 MV: Low risk w/ attenuation artifact and mild inf basal isch. Nl EF->Med rx.   DVT (deep venous thrombosis) ([place])    a. 01/2001 after CABG.   Erectile dysfunction    Hyperlipidemia LDL goal <70    Hypertension    Hypothyroidism    Palpitations    Pericardial effusion    a. 01/2001 Effusion w/ tamponade req window following CABG.   Tobacco use    vape, no cigarrette since 04/2015   Type II diabetes mellitus  ([place])     Past Surgical History:  Procedure Laterality Date   APPENDECTOMY  2001   CARDIAC CATHETERIZATION  2000   stent   COLONOSCOPY WITH PROPOFOL N/A 03/17/2018   Procedure: COLONOSCOPY WITH PROPOFOL;  Surgeon: Lucilla Lame, MD;  Location: [place];  Service: Endoscopy;  Laterality: N/A;   CORONARY ANGIOPLASTY  jan. 2001   CORONARY ANGIOPLASTY WITH STENT PLACEMENT  june 2001   stent   CORONARY ARTERY BYPASS GRAFT  dec. 2002   LIMA graft to LAD,RIMA graft to the intermediate branch and saphenous vein graft  to the posterior lateral and posterior descending branches of the right coronary   LEFT HEART CATH AND CORS/GRAFTS ANGIOGRAPHY N/A 07/28/2020   Procedure: LEFT HEART CATH AND CORS/GRAFTS ANGIOGRAPHY;  Surgeon: Wellington Hampshire, MD;  Location: [place] CV [facility];  Service: Cardiovascular;  Laterality: N/A;   POLYPECTOMY  03/17/2018   Procedure: POLYPECTOMY;  Surgeon: Lucilla Lame, MD;  Location: [facility];  Service: Endoscopy;;    Current Medications: Current Meds  Medication Sig   aspirin (ASPIRIN LOW DOSE) 81 MG EC tablet Take 1 tablet (81 mg total) by mouth daily. Swallow whole.   atorvastatin (LIPITOR) 10 MG tablet Take 1 tablet (10 mg total) by mouth daily.   cetirizine (ZYRTEC) 10 MG tablet Take  10 mg by mouth daily.   clopidogrel (PLAVIX) 75 MG tablet Take 1 tablet (75 mg total) by mouth daily with breakfast.   Continuous Blood Gluc Receiver (DEXCOM G6 RECEIVER) DEVI Use as instructed   Continuous Blood Gluc Sensor (DEXCOM G6 SENSOR) MISC Use for every 14 days.   Continuous Blood Gluc Transmit (DEXCOM G6 TRANSMITTER) MISC USE AS INSTRUCTED   escitalopram (LEXAPRO) 10 MG tablet Take 1 tablet (10 mg total) by mouth daily.   ezetimibe (ZETIA) 10 MG tablet Take 1 tablet (10 mg total) by mouth daily.   fenofibrate (TRICOR) 145 MG tablet TAKE 1 TABLET BY MOUTH  DAILY   JARDIANCE 10 MG TABS tablet TAKE 1 TABLET BY MOUTH DAILY BEFORE BREAKFAST.    levothyroxine (SYNTHROID) 150 MCG tablet Take 1 tablet (150 mcg total) by mouth daily before breakfast.   metFORMIN (GLUCOPHAGE XR) 500 MG 24 hr tablet Take 1 tablet (500 mg total) by mouth daily with breakfast.   Multiple Vitamins-Minerals (MULTIVITAMIN ADULT) CHEW Chew 1 tablet by mouth daily.   nitroGLYCERIN (NITROSTAT) 0.4 MG SL tablet Place 1 tablet (0.4 mg total) under the tongue every 5 (five) minutes as needed for chest pain.   potassium chloride (KLOR-CON) 10 MEQ tablet Take 1 tablet (10 mEq total) by mouth daily.   PROAIR RESPICLICK 123XX123 (90 Base) MCG/ACT AEPB USE 2 INHALATIONS BY MOUTH  EVERY 6 HOURS AS NEEDED   sacubitril-valsartan (ENTRESTO) 24-26 MG Take 1 tablet by mouth 2 (two) times daily.   tadalafil (CIALIS) 5 MG tablet Take 5 mg by mouth daily as needed for erectile dysfunction.   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   Drug use: No   Sexual activity: Not on file  Other Topics Concern   Not on file  Social History Narrative   Lives with wife, 3 dogs, 3 children, and 2 twins .   Exercise - walking , hunting.   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   As of 03/2019   Social Determinants of Health   Financial Resource Strain: Not on file  Food Insecurity: Not on file  Transportation Needs: Not on file  Physical Activity: Not on file  Stress: Not on file  Social Connections: Not on file     Family History: The patient's family history includes Asthma in his brother; Cancer in his mother and paternal grandfather; Cancer (age  of onset: 1) in his brother; Diabetes in his paternal grandmother; Heart disease in his father and sister; Hypertension in his father; Liver cancer in his mother; Liver disease in his brother; Multiple sclerosis in his sister; Suicidality in his brother; Thyroid disease in his brother; Valvular heart disease in his father. There is no history of Prostate cancer or Kidney cancer.  ROS:   Please see the history of present illness.    All other systems reviewed and are negative.  EKGs/Labs/Other Studies Reviewed:    The following studies were reviewed today:  09/02/2020 Zio personally reviewed 44-176bpm, average 61bpm. 12 NSVT, longest 17 beats at 176bpm Idioventricular rhythm/junctional rhythm present Rare supraventricular ectopy Ventricular ectopy approximately 6.5%    08/14/2020 ECG Accelerated junctional rhythm    08/20/2020 SPECT There is a medium mild of moderate severity present in the basal anterolateral and mid inferoseptal location. Defect in the basal anterolateral location is partially reversible, while defect in the inferoseptal location appears fixed/artifactual. The left ventricular ejection fraction is moderately decreased (38%). This is an intermediate risk study. Findings consistent with prior myocardial infarction with mild peri-infarct ischemia in the anterolateral position.    07/28/2020 Echo  1. Left ventricular ejection fraction, by estimation, is 35 to 40%. The  left ventricle has moderately  decreased function. The left ventricle  demonstrates regional wall motion abnormalities (Hypokinesis of the  anterior and anteroseptal wall). There is  moderate left ventricular hypertrophy. Left ventricular diastolic  parameters are consistent with Grade I diastolic dysfunction (impaired  relaxation).   2. Right ventricular systolic function is normal. The right ventricular  size is normal.   3. Left atrial size was mildly dilated.   4. Frequent PVCs      EKG:  The ekg ordered today demonstrates junctional rhythm with a left anterior fascicular block.  QRS duration 92 ms.  Recent Labs: 07/27/2020: Magnesium 1.9 09/15/2020: ALT 19; BUN 21; Creatinine, Ser 1.11; Hemoglobin 14.3; Platelets 230; Potassium 3.8; Sodium 137; TSH 4.817  Recent Lipid Panel    Component Value Date/Time   CHOL 113 09/15/2020 0811   CHOL 125 03/14/2020 1008   TRIG 63 09/15/2020 0811   HDL 33 (L) 09/15/2020 0811   HDL 39 (L) 03/14/2020 1008   CHOLHDL 3.4 09/15/2020 0811   VLDL 13 09/15/2020 0811   LDLCALC 67 09/15/2020 0811   LDLCALC 70 03/14/2020 1008   LDLCALC 125 (H) 01/14/2017 1009   LDLDIRECT 68.4 09/15/2020 0811    Physical Exam:    VS:  BP 102/70 (BP Location: Left Arm, Patient Position: Sitting, Cuff Size: Normal)   Pulse (!) 52   Ht 5' 9.5" (1.765 m)   Wt 211 lb (95.7 kg)   SpO2 98%   BMI 30.71 kg/m     Wt Readings from Last 3 Encounters:  11/12/20 211 lb (95.7 kg)  09/22/20 213 lb (96.6 kg)  08/29/20 215 lb (97.5 kg)     GEN:  Well nourished, well developed in no acute distress HEENT: Normal NECK: No JVD; No carotid bruits LYMPHATICS: No lymphadenopathy CARDIAC: RRR, no murmurs, rubs, gallops.  Sternotomy well-healed. RESPIRATORY:  Clear to auscultation without rales, wheezing or rhonchi  ABDOMEN: Soft, non-tender, non-distended MUSCULOSKELETAL:  No edema; No deformity  SKIN: Warm and dry NEUROLOGIC:  Alert and oriented x 3 PSYCHIATRIC:  Normal affect   ASSESSMENT:    1.  History of MI (myocardial infarction)   2. Chronic combined systolic and diastolic congestive heart failure ([place])   3. Coronary artery  disease of native artery of native heart with stable angina pectoris ([place])   4. Abnormal patient-activated cardiac event monitor    PLAN:    In order of problems listed above:  1. Chronic combined systolic and diastolic congestive heart failure (HCC) NYHA class II-III.  Warm and dry on exam.  Last EF 35 to 40%.  On good medical therapy with aspirin, Plavix, Jardiance, Entresto, torsemide.  I like him to have a cardiac MRI to better assess the LV for LGE and the left ventricular ejection fraction.  If the EF is 35 or less, we will plan for ICD for primary prevention of sudden cardiac death.  I would also implant if there is a high burden of LGE/scar given his conduction system disease and NSVT.  2. History of MI (myocardial infarction) 3. Coronary artery disease of native artery of native heart with stable angina pectoris (HCC) No ischemic symptoms today.  Continue aspirin and Plavix.  4. Abnormal patient-activated cardiac event monitor See #1.   We will plan to see him in 4 to 6 weeks after his cardiac MRI.    Medication Adjustments/Labs and Tests Ordered: Current medicines are reviewed at length with the patient today.  Concerns regarding medicines are outlined above.  Orders Placed This Encounter  Procedures   EKG 12-Lead    No orders of the defined types were placed in this encounter.    Signed, Todd Kelley. Todd Ore, MD, [facility], [facility] 11/12/2020 8:45 AM    Electrophysiology [facility]

## 2020-11-12 NOTE — Patient Instructions (Signed)
Medication Instructions:  Your physician recommends that you continue on your current medications as directed. Please refer to the Current Medication list given to you today.  *If you need a refill on your cardiac medications before your next appointment, please call your pharmacy*   Lab Work: None ordered.  If you have labs (blood work) drawn today and your tests are completely normal, you will receive your results only by: [place] (if you have MyChart) OR A paper copy in the mail If you have any lab test that is abnormal or we need to change your treatment, we will call you to review the results.   Testing/Procedures: Your physician has requested that you have a cardiac MRI. Cardiac MRI uses a computer to create images of your heart as its beating, producing both still and moving pictures of your heart and major blood vessels. For further information please visit [email or web address] Please follow the instruction sheet given to you today for more information.    Follow-Up: At [facility], you and your health needs are our priority.  As part of our continuing mission to provide you with exceptional heart care, we have created designated Provider Care Teams.  These Care Teams include your primary Cardiologist (physician) and Advanced Practice Providers (APPs -  Physician Assistants and Nurse Practitioners) who all work together to provide you with the care you need, when you need it.  We recommend signing up for the patient portal called "MyChart".  Sign up information is provided on this After Visit Summary.  MyChart is used to connect with patients for Virtual Visits (Telemedicine).  Patients are able to view lab/test results, encounter notes, upcoming appointments, etc.  Non-urgent messages can be sent to your provider as well.   To learn more about what you can do with MyChart, go to [email or web address].    Your next appointment:   6 week(s)  The format for your next  appointment:   In Person  Provider:   Lars Mage, MD

## 2020-11-13 ENCOUNTER — Encounter: Payer: No Typology Code available for payment source | Admitting: *Deleted

## 2020-11-13 DIAGNOSIS — I214 Non-ST elevation (NSTEMI) myocardial infarction: Secondary | ICD-10-CM | POA: Diagnosis not present

## 2020-11-13 NOTE — Progress Notes (Signed)
Daily Session Note  Patient Details  Name: Todd Kelley MRN: [id number] Date of Birth: 1965/03/04 Referring Provider:   Flowsheet Row Cardiac Rehab from 08/19/2020 in [facility] Cardiac and Pulmonary Rehab  Referring Provider Kathlyn Sacramento MD  [facility] Cardiologist: Dr. Ida Rogue       Encounter Date: 11/13/2020  Check In:  Session Check In - 11/13/20 1713       Check-In   Supervising physician immediately available to respond to emergencies See telemetry face sheet for immediately available ER MD    Location [facility]-Cardiac & Pulmonary Rehab    Staff Present Renita Papa, RN BSN;Joseph Melrose, RCP,RRT,BSRT;Kara [place], MS, ASCM CEP, Exercise Physiologist    Virtual Visit No    Medication changes reported     No    Fall or balance concerns reported    No    Warm-up and Cool-down Performed on first and last piece of equipment    Resistance Training Performed Yes    VAD Patient? No    PAD/SET Patient? No      Pain Assessment   Currently in Pain? No/denies                Social History   Tobacco Use  Smoking Status Former   Packs/day: 0.50   Years: 10.00   Pack years: 5.00   Types: Cigarettes   Quit date: 04/08/2015   Years since quitting: 5.6  Smokeless Tobacco Never    Goals Met:  Independence with exercise equipment Exercise tolerated well No report of concerns or symptoms today Strength training completed today  Goals Unmet:  Not Applicable  Comments: Pt able to follow exercise prescription today without complaint.  Will continue to monitor for progression.    Dr. Emily Filbert is Medical Director for [place].  Dr. Ottie Glazier is Medical Director for [facility] Pulmonary Rehabilitation.

## 2020-11-14 ENCOUNTER — Ambulatory Visit: Payer: No Typology Code available for payment source

## 2020-11-17 ENCOUNTER — Ambulatory Visit: Payer: No Typology Code available for payment source

## 2020-11-17 ENCOUNTER — Encounter: Payer: No Typology Code available for payment source | Admitting: *Deleted

## 2020-11-17 ENCOUNTER — Other Ambulatory Visit: Payer: Self-pay

## 2020-11-17 DIAGNOSIS — I214 Non-ST elevation (NSTEMI) myocardial infarction: Secondary | ICD-10-CM

## 2020-11-17 NOTE — Progress Notes (Signed)
Daily Session Note  Patient Details  Name: Todd Kelley MRN: [id number] Date of Birth: 06-22-1965 Referring Provider:   Flowsheet Row Cardiac Rehab from 08/19/2020 in [facility] Cardiac and Pulmonary Rehab  Referring Provider Kathlyn Sacramento MD  [facility] Cardiologist: Dr. Ida Rogue       Encounter Date: 11/17/2020  Check In:  Session Check In - 11/17/20 1712       Check-In   Supervising physician immediately available to respond to emergencies See telemetry face sheet for immediately available ER MD    Location [facility]-Cardiac & Pulmonary Rehab    Staff Present Renita Papa, RN BSN;Joseph [place], RCP,RRT,BSRT;Kara [place], MS, ASCM CEP, Exercise Physiologist    Virtual Visit No    Medication changes reported     No    Fall or balance concerns reported    No    Warm-up and Cool-down Performed on first and last piece of equipment    Resistance Training Performed Yes    VAD Patient? No    PAD/SET Patient? No      Pain Assessment   Currently in Pain? No/denies                Social History   Tobacco Use  Smoking Status Former   Packs/day: 0.50   Years: 10.00   Pack years: 5.00   Types: Cigarettes   Quit date: 04/08/2015   Years since quitting: 5.6  Smokeless Tobacco Never    Goals Met:  Independence with exercise equipment Exercise tolerated well No report of concerns or symptoms today Strength training completed today  Goals Unmet:  Not Applicable  Comments: Pt able to follow exercise prescription today without complaint.  Will continue to monitor for progression.    Dr. Emily Filbert is Medical Director for [place].  Dr. Ottie Glazier is Medical Director for [facility] Pulmonary Rehabilitation.

## 2020-11-19 ENCOUNTER — Other Ambulatory Visit: Payer: Self-pay

## 2020-11-19 ENCOUNTER — Ambulatory Visit: Payer: No Typology Code available for payment source

## 2020-11-19 DIAGNOSIS — I214 Non-ST elevation (NSTEMI) myocardial infarction: Secondary | ICD-10-CM

## 2020-11-19 NOTE — Progress Notes (Signed)
Daily Session Note  Patient Details  Name: Todd Kelley MRN: [id number] Date of Birth: [birth year] Referring Provider:   Flowsheet Row Cardiac Rehab from 08/19/2020 in [facility] Cardiac and Pulmonary Rehab  Referring Provider Kathlyn Sacramento MD  [facility] Cardiologist: Dr. Ida Rogue       Encounter Date: 11/19/2020  Check In:  Session Check In - 11/19/20 1737       Check-In   Supervising physician immediately available to respond to emergencies See telemetry face sheet for immediately available ER MD    Location [facility]-Cardiac & Pulmonary Rehab    Staff Present Birdie Sons, MPA, Nino Glow, MS, ASCM CEP, Exercise Physiologist;Joseph Alcus Dad, RN BSN    Virtual Visit No    Medication changes reported     No    Fall or balance concerns reported    No    Tobacco Cessation No Change    Warm-up and Cool-down Performed on first and last piece of equipment    Resistance Training Performed Yes    VAD Patient? No    PAD/SET Patient? No      Pain Assessment   Currently in Pain? No/denies                Social History   Tobacco Use  Smoking Status Former   Packs/day: 0.50   Years: 10.00   Pack years: 5.00   Types: Cigarettes   Quit date: 04/08/2015   Years since quitting: 5.6  Smokeless Tobacco Never    Goals Met:  Independence with exercise equipment Exercise tolerated well No report of concerns or symptoms today Strength training completed today  Goals Unmet:  Not Applicable  Comments: Pt able to follow exercise prescription today without complaint.  Will continue to monitor for progression.    Dr. Emily Filbert is Medical Director for [place].  Dr. Ottie Glazier is Medical Director for [facility] Pulmonary Rehabilitation.

## 2020-11-20 ENCOUNTER — Encounter: Payer: No Typology Code available for payment source | Admitting: *Deleted

## 2020-11-20 DIAGNOSIS — I214 Non-ST elevation (NSTEMI) myocardial infarction: Secondary | ICD-10-CM | POA: Diagnosis not present

## 2020-11-20 NOTE — Progress Notes (Signed)
Daily Session Note  Patient Details  Name: Todd Kelley MRN: [id number] Date of Birth: December 05, 1965 Referring Provider:   Flowsheet Row Cardiac Rehab from 08/19/2020 in [facility] Cardiac and Pulmonary Rehab  Referring Provider Kathlyn Sacramento MD  [facility] Cardiologist: Dr. Ida Rogue       Encounter Date: 11/20/2020  Check In:  Session Check In - 11/20/20 1723       Check-In   Supervising physician immediately available to respond to emergencies See telemetry face sheet for immediately available ER MD    Location [facility]-Cardiac & Pulmonary Rehab    Staff Present Renita Papa, RN BSN;Melissa Wheeling, RDN, Rowe Pavy, BA, ACSM CEP, Exercise Physiologist    Virtual Visit No    Medication changes reported     No    Fall or balance concerns reported    No    Warm-up and Cool-down Performed on first and last piece of equipment    Resistance Training Performed Yes    VAD Patient? No    PAD/SET Patient? No      Pain Assessment   Currently in Pain? No/denies                Social History   Tobacco Use  Smoking Status Former   Packs/day: 0.50   Years: 10.00   Pack years: 5.00   Types: Cigarettes   Quit date: 04/08/2015   Years since quitting: 5.6  Smokeless Tobacco Never    Goals Met:  Independence with exercise equipment Exercise tolerated well No report of concerns or symptoms today Strength training completed today  Goals Unmet:  Not Applicable  Comments: Pt able to follow exercise prescription today without complaint.  Will continue to monitor for progression.    Dr. Emily Filbert is Medical Director for [place].  Dr. Ottie Glazier is Medical Director for [facility] Pulmonary Rehabilitation.

## 2020-11-24 ENCOUNTER — Other Ambulatory Visit: Payer: Self-pay

## 2020-11-24 ENCOUNTER — Encounter: Payer: No Typology Code available for payment source | Admitting: *Deleted

## 2020-11-24 VITALS — Ht 70.2 in | Wt 208.7 lb

## 2020-11-24 DIAGNOSIS — I214 Non-ST elevation (NSTEMI) myocardial infarction: Secondary | ICD-10-CM | POA: Diagnosis not present

## 2020-11-24 NOTE — Progress Notes (Signed)
Daily Session Note  Patient Details  Name: Todd Kelley MRN: [id number] Date of Birth: Jul 27, 1965 Referring Provider:   Flowsheet Row Cardiac Rehab from 08/19/2020 in [facility] Cardiac and Pulmonary Rehab  Referring Provider Kathlyn Sacramento MD  [facility] Cardiologist: Dr. Ida Rogue       Encounter Date: 11/24/2020  Check In:  Session Check In - 11/24/20 1716       Check-In   Supervising physician immediately available to respond to emergencies See telemetry face sheet for immediately available ER MD    Location [facility]-Cardiac & Pulmonary Rehab    Staff Present Renita Papa, RN Vickki Hearing, BA, ACSM CEP, Exercise Physiologist;Kara Eliezer Bottom, MS, ASCM CEP, Exercise Physiologist    Virtual Visit No    Medication changes reported     No    Fall or balance concerns reported    No    Warm-up and Cool-down Performed on first and last piece of equipment    Resistance Training Performed Yes    VAD Patient? No    PAD/SET Patient? No      Pain Assessment   Currently in Pain? No/denies                Social History   Tobacco Use  Smoking Status Former   Packs/day: 0.50   Years: 10.00   Pack years: 5.00   Types: Cigarettes   Quit date: 04/08/2015   Years since quitting: 5.6  Smokeless Tobacco Never    Goals Met:  Independence with exercise equipment Exercise tolerated well No report of concerns or symptoms today Strength training completed today  Goals Unmet:  Not Applicable  Comments: Pt able to follow exercise prescription today without complaint.  Will continue to monitor for progression.    Dr. Emily Filbert is Medical Director for [place].  Dr. Ottie Glazier is Medical Director for [facility] Pulmonary Rehabilitation.

## 2020-11-26 NOTE — Patient Instructions (Signed)
Discharge Patient Instructions  Patient Details  Name: Todd Kelley MRN: [id number] Date of Birth: Jul 23, 1965 Referring Provider:  Wellington Hampshire, MD   Number of Visits: 56  Reason for Discharge:  Patient reached a stable level of exercise. Patient independent in their exercise. Patient has met program and personal goals.  Smoking History:  Social History   Tobacco Use  Smoking Status Former   Packs/day: 0.50   Years: 10.00   Pack years: 5.00   Types: Cigarettes   Quit date: 04/08/2015   Years since quitting: 5.6  Smokeless Tobacco Never    Diagnosis:  NSTEMI (non-ST elevated myocardial infarction) [facility])  Initial Exercise Prescription:  Initial Exercise Prescription - 08/19/20 0900       Date of Initial Exercise RX and Referring Provider   Date 08/19/20    Referring Provider Kathlyn Sacramento MD   Primary Cardiologist: Dr. Ida Rogue     Treadmill   MPH 2.7    Grade 1    Minutes 15    METs 3.44      Recumbant Bike   Level 4    RPM 50    Watts 50    Minutes 15    METs 3.5      Arm Ergometer   Level 2    RPM 25    Minutes 15    METs 3      Recumbant Elliptical   Level 2    RPM 50    Minutes 15    METs 3      REL-XR   Level 3    Speed 50    Minutes 15    METs 3      Biostep-RELP   Level 4    SPM 50    Minutes 15    METs 3      Track   Laps 40    Minutes 15    METs 3.2      Prescription Details   Frequency (times per week) 3    Duration Progress to 30 minutes of continuous aerobic without signs/symptoms of physical distress      Intensity   THRR 40-80% of Max Heartrate 104-144    Ratings of Perceived Exertion 11-13    Perceived Dyspnea 0-4      Progression   Progression Continue to progress workloads to maintain intensity without signs/symptoms of physical distress.      Resistance Training   Training Prescription Yes    Weight 4 lb    Reps 10-15             Discharge Exercise Prescription (Final Exercise  Prescription Changes):  Exercise Prescription Changes - 11/26/20 1300       Response to Exercise   Blood Pressure (Admit) 106/64    Blood Pressure (Exit) 104/62    Heart Rate (Admit) 52 bpm    Heart Rate (Exercise) 93 bpm    Heart Rate (Exit) 60 bpm    Rating of Perceived Exertion (Exercise) 11    Symptoms none    Duration Continue with 30 min of aerobic exercise without signs/symptoms of physical distress.    Intensity THRR unchanged      Progression   Progression Continue to progress workloads to maintain intensity without signs/symptoms of physical distress.    Average METs 8      Resistance Training   Training Prescription Yes    Weight 12 lb    Reps 10-15      Interval Training  Interval Training Yes    Equipment Treadmill    Comments 3.3/6.5-12      Home Exercise Plan   Plans to continue exercise at [employer] (comment)   [employer]   Frequency Add 3 additional days to program exercise sessions.   Start with 1 day   Initial Home Exercises Provided 09/22/20             Functional Capacity:  6 Minute Walk     Row Name 08/19/20 0932 11/24/20 1735       6 Minute Walk   Phase Initial Discharge    Distance 1470 feet 1680 feet    Distance % Change -- 14.2 %    Distance Feet Change -- 210 ft    Walk Time 6 minutes 6 minutes    # of Rest Breaks 0 0    MPH 2.78 3.18    METS 3.61 4.06    RPE 8 11    Perceived Dyspnea  -- 0    VO2 Peak 12.62 14.23    Symptoms No No    Resting HR 64 bpm 52 bpm    Resting BP 126/66 106/64    Resting Oxygen Saturation  98 % --    Exercise Oxygen Saturation  during 6 min walk 96 % --    Max Ex. HR 79 bpm 83 bpm    Max Ex. BP 136/70 140/68             Quality of Life:  Quality of Life - 08/19/20 0941       Quality of Life   Select Quality of Life      Quality of Life Scores   Health/Function Pre 23.87 %    Socioeconomic Pre 26 %    Psych/Spiritual Pre 26.64 %    Family Pre 27.6 %    GLOBAL Pre  25.44 %             Personal Goals: Goals established at orientation with interventions provided to work toward goal.  Personal Goals and Risk Factors at Admission - 08/19/20 0942       Core Components/Risk Factors/Patient Goals on Admission    Weight Management Yes;Obesity;Weight Loss    Intervention Weight Management: Develop a combined nutrition and exercise program designed to reach desired caloric intake, while maintaining appropriate intake of nutrient and fiber, sodium and fats, and appropriate energy expenditure required for the weight goal.;Weight Management: Provide education and appropriate resources to help participant work on and attain dietary goals.;Weight Management/Obesity: Establish reasonable short term and long term weight goals.;Obesity: Provide education and appropriate resources to help participant work on and attain dietary goals.    Admit Weight 218 lb (98.9 kg)    Goal Weight: Short Term 210 lb (95.3 kg)    Goal Weight: Long Term 200 lb (90.7 kg)    Expected Outcomes Short Term: Continue to assess and modify interventions until short term weight is achieved;Long Term: Adherence to nutrition and physical activity/exercise program aimed toward attainment of established weight goal;Weight Loss: Understanding of general recommendations for a balanced deficit meal plan, which promotes 1-2 lb weight loss per week and includes a negative energy balance of [phone] kcal/d;Understanding recommendations for meals to include 15-35% energy as protein, 25-35% energy from fat, 35-60% energy from carbohydrates, less than 239m of dietary cholesterol, 20-35 gm of total fiber daily;Understanding of distribution of calorie intake throughout the day with the consumption of 4-5 meals/snacks    Diabetes Yes  Intervention Provide education about signs/symptoms and action to take for hypo/hyperglycemia.;Provide education about proper nutrition, including hydration, and aerobic/resistive  exercise prescription along with prescribed medications to achieve blood glucose in normal ranges: Fasting glucose 65-99 mg/dL    Expected Outcomes Short Term: Participant verbalizes understanding of the signs/symptoms and immediate care of hyper/hypoglycemia, proper foot care and importance of medication, aerobic/resistive exercise and nutrition plan for blood glucose control.;Long Term: Attainment of HbA1C < 7%.    Hypertension Yes    Intervention Provide education on lifestyle modifcations including regular physical activity/exercise, weight management, moderate sodium restriction and increased consumption of fresh fruit, vegetables, and low fat dairy, alcohol moderation, and smoking cessation.;Monitor prescription use compliance.    Expected Outcomes Short Term: Continued assessment and intervention until BP is < 140/49m HG in hypertensive participants. < 130/834mHG in hypertensive participants with diabetes, heart failure or chronic kidney disease.;Long Term: Maintenance of blood pressure at goal levels.    Lipids Yes    Intervention Provide education and support for participant on nutrition & aerobic/resistive exercise along with prescribed medications to achieve LDL <7052mHDL >88m83m  Expected Outcomes Short Term: Participant states understanding of desired cholesterol values and is compliant with medications prescribed. Participant is following exercise prescription and nutrition guidelines.;Long Term: Cholesterol controlled with medications as prescribed, with individualized exercise RX and with personalized nutrition plan. Value goals: LDL < 70mg90mL > 40 mg.              Personal Goals Discharge:  Goals and Risk Factor Review - 11/12/20 1805       Core Components/Risk Factors/Patient Goals Review   Personal Goals Review Weight Management/Obesity;Hypertension;Diabetes;Heart Failure    Review Kallan's blood sugars have ranged between 80-180. His normal fasting blood sugar is 115 and  his doctor wants it below 130. . He Marland Kitchens staying compliant with all his medications. He denies any symptoms of heart failure at this time and tolerates his exercise very well. BPs have been checked at home and staying stable, he has had a couple low ones but is not symptomatic. Weight has been stable, though he'd like to lose more.    Expected Outcomes Short: Continue to check sugars and weight at home Long: Continue to manage lifestyle risk factors             Exercise Goals and Review:  Exercise Goals     Row Name 08/19/20 0939             Exercise Goals   Increase Physical Activity Yes       Intervention Provide advice, education, support and counseling about physical activity/exercise needs.;Develop an individualized exercise prescription for aerobic and resistive training based on initial evaluation findings, risk stratification, comorbidities and participant's personal goals.       Expected Outcomes Short Term: Attend rehab on a regular basis to increase amount of physical activity.;Long Term: Add in home exercise to make exercise part of routine and to increase amount of physical activity.;Long Term: Exercising regularly at least 3-5 days a week.       Increase Strength and Stamina Yes       Intervention Provide advice, education, support and counseling about physical activity/exercise needs.;Develop an individualized exercise prescription for aerobic and resistive training based on initial evaluation findings, risk stratification, comorbidities and participant's personal goals.       Expected Outcomes Short Term: Increase workloads from initial exercise prescription for resistance, speed, and METs.;Long Term: Improve cardiorespiratory fitness, muscular endurance  and strength as measured by increased METs and functional capacity (6MWT);Short Term: Perform resistance training exercises routinely during rehab and add in resistance training at home       Able to understand and use rate of  perceived exertion (RPE) scale Yes       Intervention Provide education and explanation on how to use RPE scale       Expected Outcomes Short Term: Able to use RPE daily in rehab to express subjective intensity level;Long Term:  Able to use RPE to guide intensity level when exercising independently       Able to understand and use Dyspnea scale Yes       Intervention Provide education and explanation on how to use Dyspnea scale       Expected Outcomes Short Term: Able to use Dyspnea scale daily in rehab to express subjective sense of shortness of breath during exertion;Long Term: Able to use Dyspnea scale to guide intensity level when exercising independently       Knowledge and understanding of Target Heart Rate Range (THRR) Yes       Intervention Provide education and explanation of THRR including how the numbers were predicted and where they are located for reference       Expected Outcomes Short Term: Able to use daily as guideline for intensity in rehab;Short Term: Able to state/look up THRR;Long Term: Able to use THRR to govern intensity when exercising independently       Able to check pulse independently Yes       Intervention Provide education and demonstration on how to check pulse in carotid and radial arteries.;Review the importance of being able to check your own pulse for safety during independent exercise       Expected Outcomes Short Term: Able to explain why pulse checking is important during independent exercise;Long Term: Able to check pulse independently and accurately       Understanding of Exercise Prescription Yes       Intervention Provide education, explanation, and written materials on patient's individual exercise prescription       Expected Outcomes Short Term: Able to explain program exercise prescription;Long Term: Able to explain home exercise prescription to exercise independently                Exercise Goals Re-Evaluation:  Exercise Goals Re-Evaluation      Row Name 08/25/20 0934 09/04/20 0953 09/17/20 1552 09/22/20 1729 10/01/20 1125     Exercise Goal Re-Evaluation   Exercise Goals Review Able to understand and use rate of perceived exertion (RPE) scale;Knowledge and understanding of Target Heart Rate Range (THRR);Able to understand and use Dyspnea scale;Understanding of Exercise Prescription Increase Physical Activity;Increase Strength and Stamina Increase Physical Activity;Increase Strength and Stamina;Understanding of Exercise Prescription Increase Physical Activity;Increase Strength and Stamina;Understanding of Exercise Prescription Increase Physical Activity;Increase Strength and Stamina   Comments Reviewed RPE and dyspnea scales, THR and program prescription with pt today.  Pt voiced understanding and was given a copy of goals to take home. Charlton is starting off well for his first couple weeks of exercise. He has already increased to 5 lb handweights and increased his incline on the Treadmill to 15%. Will Continue to monitor Jaris is doing well in rehab.  He is up to level 8 on the XR. We will encourage him to increase his weights and continue to montior his progress. Reviewed home exercise with pt today.  Pt plans to walk and join [employer] for exercise.  Reviewed  THR, pulse, RPE, sign and symptoms, pulse oximetery and when to call 911 or MD.  Also discussed weather considerations and indoor options.  Pt voiced understanding. Cohan is progressing well. he has increased speed and grade on TM.  Staff will continue to monitor progress.   Expected Outcomes Short: Use RPE daily to regulate intensity. Long: Follow program prescription in THR. Short: Continue to build up speed on treadmill Long: Increase overall MET level Short: Try 6 lb handweights Long: continue to improve stamina Short: Add on 1 day of exercise at home and start checking HR Long: Continue to exercise independently at appropriate prescription Short: maintain consistent attendance Long:  continue to build stamina    Row Name 10/16/20 0856 10/27/20 1731 11/10/20 0947 11/12/20 1755 11/26/20 1329     Exercise Goal Re-Evaluation   Exercise Goals Review Increase Physical Activity;Increase Strength and Stamina Understanding of Exercise Prescription Increase Physical Activity;Increase Strength and Stamina Increase Physical Activity;Increase Strength and Stamina Increase Physical Activity;Increase Strength and Stamina   Comments Baine is doing very well in rehab. He is now up to level 7 on the recumbant bike and increased his handweights to 12 lb. Staff should introduce intervals for patient- we will continue to monitor progress. Costas states that he is not doing much exercise at home like he should be. His plan is to join a gym when he is done with [facility]. Informed him of [facility] which is down the road from him. Elizabeth is doing well in rehab. He started doing intervals on the treadmill, highest incline at 12.5%. He states he feels well doing it as it gives him more of a challenge. He also increased to level 9 on the XR. Will continue to monitor. Richmond admits he is not exercising at home as much as he should. He is keeping busy around the house but no structured exericse. We went over the difference of exercise and being physically active. He plans to join [employer] once he graduates from here. He is due for his psot 6MWT and should improve significantly. Kaiea improved post walk by 210 feet!   Expected Outcomes Short: Try intervals on the treadmill Long: Continue to increase overall MET level Short: join a gym. Long: maintain working out independently. Short: Continue intervals and try on different machine Long: Continue to increase overall MET level Short: Improve on 6MWT and graduate Long: Exercise at home independently at appropriate exercise prescription Short: complete HT Long: maintain exercise on his own            Nutrition & Weight - Outcomes:  Pre Biometrics - 08/19/20  0940       Pre Biometrics   Height 5' 10.2" (1.783 m)    Weight 218 lb (98.9 kg)    BMI (Calculated) 31.1    Single Leg Stand 30 seconds             Post Biometrics - 11/24/20 1736        Post  Biometrics   Height 5' 10.2" (1.783 m)    Weight 208 lb 11.2 oz (94.7 kg)    BMI (Calculated) 29.78             Nutrition:  Nutrition Therapy & Goals - 09/25/20 1649       Nutrition Therapy   Diet Heart healthy, low Na, diabetes friendly    Drug/Food Interactions Statins/Certain Fruits    Protein (specify units) 75g    Fiber 30 grams    Whole Grain Foods  3 servings    Saturated Fats 12 max. grams    Fruits and Vegetables 8 servings/day    Sodium 1.5 grams      Personal Nutrition Goals   Nutrition Goal ST: Practice MyPlate and CHO counting (2-3 CHO per snack, 1-2 CHO per snack), CHO with protein and fat LT: 8 fruits and vegetables per day, a rainbow of fruits and vegetables per week, maintain BG during the day    Comments Will drink diet soda. B: triple zero yogurt, activia yogurt (helps with reflux), protein bar  - wants to fix 2-3 scrambled eggs with toast and yogurt S: protein bar L: 2 packs of tuna, crackers, sugar free pudding S: handful on nuts D: variety - last night grilled cheese with tomato soup. He likes salmon and catfish, rarely eats red meat, will eat baked chicken. Will sometimes have mashed potatoes, rice (flavored rice packets), vegetables. Uses Mrs. Dash. Butter or pam. Drinks: tea and lemonade sugar free packets, coffee (sugar free creamer). Discussed heart healthy eating and diabetes friendly eating.      Intervention Plan   Intervention Prescribe, educate and counsel regarding individualized specific dietary modifications aiming towards targeted core components such as weight, hypertension, lipid management, diabetes, heart failure and other comorbidities.    Expected Outcomes Short Term Goal: Understand basic principles of dietary content, such as calories,  fat, sodium, cholesterol and nutrients.;Short Term Goal: A plan has been developed with personal nutrition goals set during dietitian appointment.;Long Term Goal: Adherence to prescribed nutrition plan.             Nutrition Discharge:   Education Questionnaire Score:  Knowledge Questionnaire Score - 08/19/20 0942       Knowledge Questionnaire Score   Pre Score 24/26 Education Focus: Nutrition, Risk factors             Goals reviewed with patient; copy given to patient.

## 2020-11-27 ENCOUNTER — Other Ambulatory Visit: Payer: Self-pay

## 2020-11-27 ENCOUNTER — Encounter: Payer: No Typology Code available for payment source | Admitting: *Deleted

## 2020-11-27 DIAGNOSIS — I214 Non-ST elevation (NSTEMI) myocardial infarction: Secondary | ICD-10-CM | POA: Diagnosis not present

## 2020-11-27 NOTE — Progress Notes (Signed)
Discharge Progress Report  Patient Details  Name: Todd Kelley MRN: [id number] Date of Birth: 1965/11/07 Referring Provider:   Flowsheet Row Cardiac Rehab from 08/19/2020 in [facility]  Referring Provider Kathlyn Sacramento MD  [facility] Cardiologist: Dr. Ida Rogue        Number of Visits: 58  Reason for Discharge:  Patient reached a stable level of exercise. Patient independent in their exercise. Patient has met program and personal goals.  Smoking History:  Social History   Tobacco Use  Smoking Status Former   Packs/day: 0.50   Years: 10.00   Pack years: 5.00   Types: Cigarettes   Quit date: 04/08/2015   Years since quitting: 5.6  Smokeless Tobacco Never    Diagnosis:  NSTEMI (non-ST elevated myocardial infarction) ([place])  ADL [facility]:   Initial Exercise Prescription:  Initial Exercise Prescription - 08/19/20 0900       Date of Initial Exercise RX and Referring Provider   Date 08/19/20    Referring Provider Kathlyn Sacramento MD   Primary Cardiologist: Dr. Ida Rogue     Treadmill   MPH 2.7    Grade 1    Minutes 15    METs 3.44      Recumbant Bike   Level 4    RPM 50    Watts 50    Minutes 15    METs 3.5      Arm Ergometer   Level 2    RPM 25    Minutes 15    METs 3      Recumbant Elliptical   Level 2    RPM 50    Minutes 15    METs 3      REL-XR   Level 3    Speed 50    Minutes 15    METs 3      Biostep-RELP   Level 4    SPM 50    Minutes 15    METs 3      Track   Laps 40    Minutes 15    METs 3.2      Prescription Details   Frequency (times per week) 3    Duration Progress to 30 minutes of continuous aerobic without signs/symptoms of physical distress      Intensity   THRR 40-80% of Max Heartrate 104-144    Ratings of Perceived Exertion 11-13    Perceived Dyspnea 0-4      Progression   Progression Continue to progress workloads to maintain intensity without signs/symptoms of physical distress.       Resistance Training   Training Prescription Yes    Weight 4 lb    Reps 10-15             Discharge Exercise Prescription (Final Exercise Prescription Changes):  Exercise Prescription Changes - 11/26/20 1300       Response to Exercise   Blood Pressure (Admit) 106/64    Blood Pressure (Exit) 104/62    Heart Rate (Admit) 52 bpm    Heart Rate (Exercise) 93 bpm    Heart Rate (Exit) 60 bpm    Rating of Perceived Exertion (Exercise) 11    Symptoms none    Duration Continue with 30 min of aerobic exercise without signs/symptoms of physical distress.    Intensity THRR unchanged      Progression   Progression Continue to progress workloads to maintain intensity without signs/symptoms of physical distress.    Average METs 8  Resistance Training   Training Prescription Yes    Weight 12 lb    Reps 10-15      Interval Training   Interval Training Yes    Equipment Treadmill    Comments 3.3/6.5-12      Home Exercise Plan   Plans to continue exercise at [employer] (comment)   [employer]   Frequency Add 3 additional days to program exercise sessions.   Start with 1 day   Initial Home Exercises Provided 09/22/20             Functional Capacity:  6 Minute Walk     Row Name 08/19/20 0932 11/24/20 1735       6 Minute Walk   Phase Initial Discharge    Distance 1470 feet 1680 feet    Distance % Change -- 14.2 %    Distance Feet Change -- 210 ft    Walk Time 6 minutes 6 minutes    # of Rest Breaks 0 0    MPH 2.78 3.18    METS 3.61 4.06    RPE 8 11    Perceived Dyspnea  -- 0    VO2 Peak 12.62 14.23    Symptoms No No    Resting HR 64 bpm 52 bpm    Resting BP 126/66 106/64    Resting Oxygen Saturation  98 % --    Exercise Oxygen Saturation  during 6 min walk 96 % --    Max Ex. HR 79 bpm 83 bpm    Max Ex. BP 136/70 140/68             Psychological, QOL, Others - Outcomes: PHQ 2/9: Depression screen [facility] 2/9 11/27/2020 08/19/2020 03/14/2020  03/09/2019 01/16/2018  Decreased Interest 1 1 0 0 0  Down, Depressed, Hopeless 0 0 0 0 0  PHQ - 2 Score 1 1 0 0 0  Altered sleeping 1 0 - - -  Tired, decreased energy 1 1 - - -  Change in appetite 0 0 - - -  Feeling bad or failure about yourself  0 0 - - -  Trouble concentrating 0 0 - - -  Moving slowly or fidgety/restless 0 0 - - -  Suicidal thoughts 0 0 - - -  PHQ-9 Score 3 2 - - -  Difficult doing work/chores Not difficult at all Not difficult at all - - -  Some recent data might be hidden    Quality of Life:  Quality of Life - 11/27/20 1714       Quality of Life   Select Quality of Life      Quality of Life Scores   Health/Function Pre 23.87 %    Health/Function Post 24.63 %    Health/Function % Change 3.18 %    Socioeconomic Pre 26 %    Socioeconomic Post 25.5 %    Socioeconomic % Change  -1.92 %    Psych/Spiritual Pre 26.64 %    Psych/Spiritual Post 24.21 %    Psych/Spiritual % Change -9.12 %    Family Pre 27.6 %    Family Post 24.4 %    Family % Change -11.59 %    GLOBAL Pre 25.44 %    GLOBAL Post 24.69 %    GLOBAL % Change -2.95 %              Nutrition & Weight - Outcomes:  Pre Biometrics - 08/19/20 0940       Pre Biometrics  Height 5' 10.2" (1.783 m)    Weight 218 lb (98.9 kg)    BMI (Calculated) 31.1    Single Leg Stand 30 seconds             Post Biometrics - 11/24/20 1736        Post  Biometrics   Height 5' 10.2" (1.783 m)    Weight 208 lb 11.2 oz (94.7 kg)    BMI (Calculated) 29.78             Nutrition:  Nutrition Therapy & Goals - 09/25/20 1649       Nutrition Therapy   Diet Heart healthy, low Na, diabetes friendly    Drug/Food Interactions Statins/Certain Fruits    Protein (specify units) 75g    Fiber 30 grams    Whole Grain Foods 3 servings    Saturated Fats 12 max. grams    Fruits and Vegetables 8 servings/day    Sodium 1.5 grams      Personal Nutrition Goals   Nutrition Goal ST: Practice MyPlate and CHO  counting (2-3 CHO per snack, 1-2 CHO per snack), CHO with protein and fat LT: 8 fruits and vegetables per day, a rainbow of fruits and vegetables per week, maintain BG during the day    Comments Will drink diet soda. B: triple zero yogurt, activia yogurt (helps with reflux), protein bar  - wants to fix 2-3 scrambled eggs with toast and yogurt S: protein bar L: 2 packs of tuna, crackers, sugar free pudding S: handful on nuts D: variety - last night grilled cheese with tomato soup. He likes salmon and catfish, rarely eats red meat, will eat baked chicken. Will sometimes have mashed potatoes, rice (flavored rice packets), vegetables. Uses Mrs. Dash. Butter or pam. Drinks: tea and lemonade sugar free packets, coffee (sugar free creamer). Discussed heart healthy eating and diabetes friendly eating.      Intervention Plan   Intervention Prescribe, educate and counsel regarding individualized specific dietary modifications aiming towards targeted core components such as weight, hypertension, lipid management, diabetes, heart failure and other comorbidities.    Expected Outcomes Short Term Goal: Understand basic principles of dietary content, such as calories, fat, sodium, cholesterol and nutrients.;Short Term Goal: A plan has been developed with personal nutrition goals set during dietitian appointment.;Long Term Goal: Adherence to prescribed nutrition plan.              Education Questionnaire Score:  Knowledge Questionnaire Score - 11/27/20 1714       Knowledge Questionnaire Score   Post Score 25/26             Goals reviewed with patient; copy given to patient.

## 2020-11-27 NOTE — Progress Notes (Signed)
Daily Session Note  Patient Details  Name: Todd Kelley MRN: [id number] Date of Birth: 1966-02-21 Referring Provider:   Flowsheet Row Cardiac Rehab from 08/19/2020 in [facility] Cardiac and Pul[facility] Rehab  Referring Provider Kathlyn Sacramento MD  [facility] Cardiologist: Dr. Ida Rogue       Encounter Date: 11/27/2020  Check In:  Session Check In - 11/27/20 1716       Check-In   Supervising physician immediately available to respond to emergencies See telemetry face sheet for immediately available ER MD    Location [facility]-Cardiac & Pulmonary Rehab    Staff Present Renita Papa, RN Margurite Auerbach, MS, ASCM CEP, Exercise Physiologist;Amanda Oletta Darter, BA, ACSM CEP, Exercise Physiologist    Virtual Visit No    Medication changes reported     No    Fall or balance concerns reported    No    Warm-up and Cool-down Performed on first and last piece of equipment    Resistance Training Performed Yes    VAD Patient? No    PAD/SET Patient? No      Pain Assessment   Currently in Pain? No/denies                Social History   Tobacco Use  Smoking Status Former   Packs/day: 0.50   Years: 10.00   Pack years: 5.00   Types: Cigarettes   Quit date: 04/08/2015   Years since quitting: 5.6  Smokeless Tobacco Never    Goals Met:  Independence with exercise equipment Exercise tolerated well No report of concerns or symptoms today Strength training completed today  Goals Unmet:  Not Applicable  Comments:  [name] graduated today from  rehab with 35 sessions completed.  Details of the patient's exercise prescription and what He needs to do in order to continue the prescription and progress were discussed with patient.  Patient was given a copy of prescription and goals.  Patient verbalized understanding.  [name] plans to continue to exercise by walking at home and then join a gym soon.    Dr. Emily Filbert is Medical Director for [facility].  Dr. Ottie Glazier is  Medical Director for [facility] Pulmonary Rehabilitation.

## 2020-11-27 NOTE — Progress Notes (Signed)
Cardiac Individual Treatment Plan  Patient Details  Name: Todd Kelley MRN: [id number] Date of Birth: 1965/09/19 Referring Provider:   Flowsheet Row Cardiac Rehab from 08/19/2020 in [facility] Cardiac and Pulmonary Rehab  Referring Provider Kathlyn Sacramento MD  [facility] Cardiologist: Dr. Ida Rogue       Initial Encounter Date:  Flowsheet Row Cardiac Rehab from 08/19/2020 in [facility] Cardiac and Pulmonary Rehab  Date 08/19/20       Visit Diagnosis: NSTEMI (non-ST elevated myocardial infarction) [facility])  Patient's Home Medications on Admission:  Current Outpatient Medications:    aspirin (ASPIRIN LOW DOSE) 81 MG EC tablet, Take 1 tablet (81 mg total) by mouth daily. Swallow whole., Disp: 90 tablet, Rfl: 3   atorvastatin (LIPITOR) 10 MG tablet, Take 1 tablet (10 mg total) by mouth daily., Disp: 30 tablet, Rfl: 11   cetirizine (ZYRTEC) 10 MG tablet, Take 10 mg by mouth daily., Disp: , Rfl:    clopidogrel (PLAVIX) 75 MG tablet, Take 1 tablet (75 mg total) by mouth daily with breakfast., Disp: 30 tablet, Rfl: 11   Continuous Blood Gluc Receiver (DEXCOM G6 RECEIVER) DEVI, Use as instructed, Disp: 1 each, Rfl: 0   Continuous Blood Gluc Sensor (DEXCOM G6 SENSOR) MISC, Use for every 14 days., Disp: 3 each, Rfl: 2   Continuous Blood Gluc Transmit (DEXCOM G6 TRANSMITTER) MISC, USE AS INSTRUCTED, Disp: 1 each, Rfl: 0   escitalopram (LEXAPRO) 10 MG tablet, Take 1 tablet (10 mg total) by mouth daily., Disp: 90 tablet, Rfl: 3   ezetimibe (ZETIA) 10 MG tablet, Take 1 tablet (10 mg total) by mouth daily., Disp: 30 tablet, Rfl: 11   fenofibrate (TRICOR) 145 MG tablet, Take 1 tablet (145 mg total) by mouth daily., Disp: 90 tablet, Rfl: 1   JARDIANCE 10 MG TABS tablet, TAKE 1 TABLET BY MOUTH DAILY BEFORE BREAKFAST., Disp: 30 tablet, Rfl: 2   levothyroxine (SYNTHROID) 150 MCG tablet, Take 1 tablet (150 mcg total) by mouth daily before breakfast., Disp: 90 tablet, Rfl: 1   metFORMIN (GLUCOPHAGE XR) 500 MG 24 hr  tablet, Take 1 tablet (500 mg total) by mouth daily with breakfast., Disp: 30 tablet, Rfl: 11   Multiple Vitamins-Minerals (MULTIVITAMIN ADULT) CHEW, Chew 1 tablet by mouth daily., Disp: , Rfl:    nitroGLYCERIN (NITROSTAT) 0.4 MG SL tablet, Place 1 tablet (0.4 mg total) under the tongue every 5 (five) minutes as needed for chest pain., Disp: 25 tablet, Rfl: 4   potassium chloride (KLOR-CON) 10 MEQ tablet, Take 1 tablet (10 mEq total) by mouth daily., Disp: 90 tablet, Rfl: 3   PROAIR RESPICLICK 151 (90 Base) MCG/ACT AEPB, USE 2 INHALATIONS BY MOUTH  EVERY 6 HOURS AS NEEDED, Disp: 1 each, Rfl: 0   sacubitril-valsartan (ENTRESTO) 24-26 MG, Take 1 tablet by mouth 2 (two) times daily., Disp: 60 tablet, Rfl: 11   tadalafil (CIALIS) 5 MG tablet, Take 5 mg by mouth daily as needed for erectile dysfunction., Disp: , Rfl:    torsemide (DEMADEX) 20 MG tablet, Take 1 tablet (20 mg) by mouth once daily in the morning, you may take an extra 1 tablet (20 mg) in the afternoon for abdominal swelling, Disp: 135 tablet, Rfl: 1  Past Medical History: Past Medical History:  Diagnosis Date   Acid reflux    Allergic asthma    rare flare up, worse with alllergies as of 12/2016   Coronary artery disease    a. 08/1998 Lateral MI/PCI/BMS [place]; b. 2001 PTCA x 2 2/2 ISR in  [place]; c. 2002 PCI/brachyrx 2/2 ISR in [place]; d. 01/2001 CABG x 4: LIMA->LAD, RIMA->[place], VG->RPL->RPDA; e. 01/2014 MV: Low risk w/ attenuation artifact and mild inf basal isch. Nl EF->Med rx.   DVT (deep venous thrombosis) ([place])    a. 01/2001 after CABG.   Erectile dysfunction    Hyperlipidemia LDL goal <70    Hypertension    Hypothyroidism    Palpitations    Pericardial effusion    a. 01/2001 Effusion w/ tamponade req window following CABG.   Tobacco use    vape, no cigarrette since 04/2015   Type II diabetes mellitus ([place])     Tobacco Use: Social History   Tobacco Use  Smoking Status Former   Packs/day: 0.50   Years: 10.00   Pack years: 5.00    Types: Cigarettes   Quit date: 04/08/2015   Years since quitting: 5.6  Smokeless Tobacco Never    Labs: Recent Review Flowsheet Data     Labs for ITP Cardiac and Pulmonary Rehab Latest Ref Rng & Units 12/27/2019 03/14/2020 07/25/2020 07/26/2020 09/15/2020   Cholestrol 0 - 200 mg/dL 137 125 - 207(H) 113   LDLCALC 0 - 99 mg/dL 80 70 - 138(H) 67   LDLDIRECT 0 - 99 mg/dL - - - - 68.4   HDL >40 mg/dL 37(L) 39(L) - 41 33(L)   Trlycerides <150 mg/dL 105 83 - 141 63   Hemoglobin A1c 4.8 - 5.6 % 7.2(A) 7.2(H) 7.4(H) - 7.3(H)        Exercise Target Goals: Exercise Program Goal: Individual exercise prescription set using results from initial 6 min walk test and THRR while considering  patient's activity barriers and safety.   Exercise Prescription Goal: Initial exercise prescription builds to 30-45 minutes a day of aerobic activity, 2-3 days per week.  Home exercise guidelines will be given to patient during program as part of exercise prescription that the participant will acknowledge.   Education: Aerobic Exercise: - Group verbal and visual presentation on the components of exercise prescription. Introduces F.I.T.T principle from ACSM for exercise prescriptions.  Reviews F.I.T.T. principles of aerobic exercise including progression. Written material given at graduation.   Education: Resistance Exercise: - Group verbal and visual presentation on the components of exercise prescription. Introduces F.I.T.T principle from ACSM for exercise prescriptions  Reviews F.I.T.T. principles of resistance exercise including progression. Written material given at graduation.    Education: Exercise & Equipment Safety: - Individual verbal instruction and demonstration of equipment use and safety with use of the equipment. Flowsheet Row Cardiac Rehab from 08/19/2020 in [facility] Cardiac and Pulmonary Rehab  Date 08/19/20  Educator [facility]  Instruction Review Code 1- Verbalizes Understanding       Education:  Exercise Physiology & General Exercise Guidelines: - Group verbal and written instruction with models to review the exercise physiology of the cardiovascular system and associated critical values. Provides general exercise guidelines with specific guidelines to those with heart or lung disease.    Education: Flexibility, Balance, Mind/Body Relaxation: - Group verbal and visual presentation with interactive activity on the components of exercise prescription. Introduces F.I.T.T principle from ACSM for exercise prescriptions. Reviews F.I.T.T. principles of flexibility and balance exercise training including progression. Also discusses the mind body connection.  Reviews various relaxation techniques to help reduce and manage stress (i.e. Deep breathing, progressive muscle relaxation, and visualization). Balance handout provided to take home. Written material given at graduation.   Activity Barriers & Risk Stratification:  Activity Barriers & Cardiac Risk Stratification - 08/19/20 7096  Activity Barriers & Cardiac Risk Stratification   Activity Barriers Chest Pain/Angina;Muscular Weakness;Shortness of Breath;Deconditioning;Joint Problems   tweaked hip earlier this year, occasional pain   Cardiac Risk Stratification High             6 Minute Walk:  6 Minute Walk     Row Name 08/19/20 0932 11/24/20 1735       6 Minute Walk   Phase Initial Discharge    Distance 1470 feet 1680 feet    Distance % Change -- 14.2 %    Distance Feet Change -- 210 ft    Walk Time 6 minutes 6 minutes    # of Rest Breaks 0 0    MPH 2.78 3.18    METS 3.61 4.06    RPE 8 11    Perceived Dyspnea  -- 0    VO2 Peak 12.62 14.23    Symptoms No No    Resting HR 64 bpm 52 bpm    Resting BP 126/66 106/64    Resting Oxygen Saturation  98 % --    Exercise Oxygen Saturation  during 6 min walk 96 % --    Max Ex. HR 79 bpm 83 bpm    Max Ex. BP 136/70 140/68             Oxygen Initial  Assessment:   Oxygen Re-Evaluation:   Oxygen Discharge (Final Oxygen Re-Evaluation):   Initial Exercise Prescription:  Initial Exercise Prescription - 08/19/20 0900       Date of Initial Exercise RX and Referring Provider   Date 08/19/20    Referring Provider Kathlyn Sacramento MD   Primary Cardiologist: Dr. Ida Rogue     Treadmill   MPH 2.7    Grade 1    Minutes 15    METs 3.44      Recumbant Bike   Level 4    RPM 50    Watts 50    Minutes 15    METs 3.5      Arm Ergometer   Level 2    RPM 25    Minutes 15    METs 3      Recumbant Elliptical   Level 2    RPM 50    Minutes 15    METs 3      REL-XR   Level 3    Speed 50    Minutes 15    METs 3      Biostep-RELP   Level 4    SPM 50    Minutes 15    METs 3      Track   Laps 40    Minutes 15    METs 3.2      Prescription Details   Frequency (times per week) 3    Duration Progress to 30 minutes of continuous aerobic without signs/symptoms of physical distress      Intensity   THRR 40-80% of Max Heartrate 104-144    Ratings of Perceived Exertion 11-13    Perceived Dyspnea 0-4      Progression   Progression Continue to progress workloads to maintain intensity without signs/symptoms of physical distress.      Resistance Training   Training Prescription Yes    Weight 4 lb    Reps 10-15             Perform Capillary Blood Glucose checks as needed.  Exercise Prescription Changes:   Exercise Prescription Changes     Row Name  08/19/20 0900 09/04/20 0900 09/17/20 1500 09/22/20 1800 10/01/20 1100     Response to Exercise   Blood Pressure (Admit) 126/66 110/60 122/68 -- 102/56   Blood Pressure (Exercise) 136/70 150/58 138/64 -- 110/60   Blood Pressure (Exit) 120/62 102/60 118/64 -- 116/62   Heart Rate (Admit) 64 bpm 60 bpm 78 bpm -- 63 bpm   Heart Rate (Exercise) 79 bpm 117 bpm 102 bpm -- 107 bpm   Heart Rate (Exit) 66 bpm 82 bpm 64 bpm -- 66 bpm   Oxygen Saturation (Admit) 98 % -- --  -- --   Oxygen Saturation (Exercise) 96 % -- -- -- --   Rating of Perceived Exertion (Exercise) _0 -- 14   Symptoms none none none -- none   Comments walk test results first full week of exercise -- -- --   Duration -- Continue with 30 min of aerobic exercise without signs/symptoms of physical distress. Continue with 30 min of aerobic exercise without signs/symptoms of physical distress. -- Continue with 30 min of aerobic exercise without signs/symptoms of physical distress.   Intensity -- THRR unchanged THRR unchanged -- THRR unchanged     Progression   Progression -- Continue to progress workloads to maintain intensity without signs/symptoms of physical distress. Continue to progress workloads to maintain intensity without signs/symptoms of physical distress. -- Continue to progress workloads to maintain intensity without signs/symptoms of physical distress.   Average METs -- 5.34 8.88 -- 9.3     Resistance Training   Training Prescription -- Yes Yes -- Yes   Weight -- 5 lb 5 lb -- 5 lb   Reps -- 10-15 10-15 -- 10-15     Interval Training   Interval Training -- No No -- No     Treadmill   MPH -- 2.7 3.7 -- 3.8   Grade -- 15 15 -- 15   Minutes -- 15 15 -- 15   METs -- 8.6 11.5 -- 11.8     Recumbant Bike   Level -- 5 -- -- --   Watts -- 57 -- -- --   Minutes -- 15 -- -- --   METs -- 3.63 -- -- --     REL-XR   Level -- -- 8 -- 8   Minutes -- -- 15 -- 15   METs -- -- 6.1 -- 6.7     Home Exercise Plan   Plans to continue exercise at -- -- -- [employer] (comment)  Neurosurgeon --   Frequency -- -- -- Add 3 additional days to program exercise sessions.  Start with 1 day --   Initial Home Exercises Provided -- -- -- 09/22/20 --    [place] Name 10/16/20 0800 10/28/20 1400 11/10/20 0900 11/26/20 1300       Response to Exercise   Blood Pressure (Admit) 102/60 118/60 104/60 106/64    Blood Pressure (Exit) 108/64 122/72 110/62 104/62    Heart Rate (Admit) 57 bpm 59 bpm  57 bpm 52 bpm    Heart Rate (Exercise) 111 bpm 105 bpm 103 bpm 93 bpm    Heart Rate (Exit) 63 bpm 74 bpm 66 bpm 60 bpm    Rating of Perceived Exertion (Exercise) _1 Symptoms none none none none    Duration Continue with 30 min of aerobic exercise without signs/symptoms of physical distress. Continue with 30 min of aerobic exercise without signs/symptoms of physical distress. Continue with 30 min of aerobic exercise  without signs/symptoms of physical distress. Continue with 30 min of aerobic exercise without signs/symptoms of physical distress.    Intensity THRR unchanged THRR unchanged THRR unchanged THRR unchanged         Progression   Progression Continue to progress workloads to maintain intensity without signs/symptoms of physical distress. Continue to progress workloads to maintain intensity without signs/symptoms of physical distress. Continue to progress workloads to maintain intensity without signs/symptoms of physical distress. Continue to progress workloads to maintain intensity without signs/symptoms of physical distress.    Average METs 7.2 7.1 7.51 8         Resistance Training   Training Prescription Yes Yes Yes Yes    Weight 12 lb 12 lb 12 lb 12 lb    Reps 10-15 10-15 10-15 10-15         Interval Training   Interval Training No No Yes Yes    Equipment -- -- Treadmill Treadmill    Comments -- -- 3.3/6.5-12.5% 3.3/6.5-12         Treadmill   MPH 3.7 3.3 3.3 --    Grade _0 --    Minutes _1 --    METs 8.4 7.62  intervals 6-12 % 7.62  Intervals --         Recumbant Bike   Level 7 -- -- --    Watts 53 -- -- --    Minutes 15 -- -- --    METs 3.64 -- -- --         [employer]   Level 6 -- -- --    Minutes 15 -- -- --    METs 5.3 -- -- --         REL-XR   Level 8 -- 9 --    Minutes 15 -- 15 --    METs 6.2 -- 7.4 --         Home Exercise Plan   Plans to continue exercise at [employer] (comment)  [employer], commissioning  (comment)  Editor, commissioning (comment)  [employer], [employer]ing (comment)  [employer]    Frequency Add 3 additional days to program exercise sessions.  Start with 1 day Add 3 additional days to program exercise sessions.  Start with 1 day Add 3 additional days to program exercise sessions.  Start with 1 day Add 3 additional days to program exercise sessions.  Start with 1 day    Initial Home Exercises Provided 09/22/20 09/22/20 09/22/20 09/22/20             Exercise Comments:   Exercise Comments     Row Name 08/25/20 0739 08/25/20 0932         Exercise Comments DIscharged Jasmeet called to state he has a new job and will not be able to attend the program DIscharged Lyonel called to state he has a new job and will not be able to attend the program   ERROR   wrong patient                                                                      First full day of exercise!  Patient was oriented to gym and equipment including functions, settings, policies,  and procedures.  Patient's individual exercise prescription and treatment plan were reviewed.  All starting workloads were established based on the results of the 6 minute walk test done at initial orientation visit.  The plan for exercise progression was also introduced and progression will be customized based on patient's performance and goals.               Exercise Goals and Review:   Exercise Goals     Row Name 08/19/20 0939             Exercise Goals   Increase Physical Activity Yes       Intervention Provide advice, education, support and counseling about physical activity/exercise needs.;Develop an individualized exercise prescription for aerobic and resistive training based on initial evaluation findings, risk stratification, comorbidities and participant's personal goals.       Expected Outcomes Short Term: Attend rehab on a regular basis to increase amount of physical activity.;Long Term: Add in  home exercise to make exercise part of routine and to increase amount of physical activity.;Long Term: Exercising regularly at least 3-5 days a week.       Increase Strength and Stamina Yes       Intervention Provide advice, education, support and counseling about physical activity/exercise needs.;Develop an individualized exercise prescription for aerobic and resistive training based on initial evaluation findings, risk stratification, comorbidities and participant's personal goals.       Expected Outcomes Short Term: Increase workloads from initial exercise prescription for resistance, speed, and METs.;Long Term: Improve cardiorespiratory fitness, muscular endurance and strength as measured by increased METs and functional capacity (6MWT);Short Term: Perform resistance training exercises routinely during rehab and add in resistance training at home       Able to understand and use rate of perceived exertion (RPE) scale Yes       Intervention Provide education and explanation on how to use RPE scale       Expected Outcomes Short Term: Able to use RPE daily in rehab to express subjective intensity level;Long Term:  Able to use RPE to guide intensity level when exercising independently       Able to understand and use Dyspnea scale Yes       Intervention Provide education and explanation on how to use Dyspnea scale       Expected Outcomes Short Term: Able to use Dyspnea scale daily in rehab to express subjective sense of shortness of breath during exertion;Long Term: Able to use Dyspnea scale to guide intensity level when exercising independently       Knowledge and understanding of Target Heart Rate Range (THRR) Yes       Intervention Provide education and explanation of THRR including how the numbers were predicted and where they are located for reference       Expected Outcomes Short Term: Able to use daily as guideline for intensity in rehab;Short Term: Able to state/look up THRR;Long Term: Able to  use THRR to govern intensity when exercising independently       Able to check pulse independently Yes       Intervention Provide education and demonstration on how to check pulse in carotid and radial arteries.;Review the importance of being able to check your own pulse for safety during independent exercise       Expected Outcomes Short Term: Able to explain why pulse checking is important during independent exercise;Long Term: Able to check pulse independently and accurately       Understanding of  Exercise Prescription Yes       Intervention Provide education, explanation, and written materials on patient's individual exercise prescription       Expected Outcomes Short Term: Able to explain program exercise prescription;Long Term: Able to explain home exercise prescription to exercise independently                Exercise Goals Re-Evaluation :  Exercise Goals Re-Evaluation     Row Name 08/25/20 0934 09/04/20 0953 09/17/20 1552 09/22/20 1729 10/01/20 1125     Exercise Goal Re-Evaluation   Exercise Goals Review Able to understand and use rate of perceived exertion (RPE) scale;Knowledge and understanding of Target Heart Rate Range (THRR);Able to understand and use Dyspnea scale;Understanding of Exercise Prescription Increase Physical Activity;Increase Strength and Stamina Increase Physical Activity;Increase Strength and Stamina;Understanding of Exercise Prescription Increase Physical Activity;Increase Strength and Stamina;Understanding of Exercise Prescription Increase Physical Activity;Increase Strength and Stamina   Comments Reviewed RPE and dyspnea scales, THR and program prescription with pt today.  Pt voiced understanding and was given a copy of goals to take home. Garris is starting off well for his first couple weeks of exercise. He has already increased to 5 lb handweights and increased his incline on the Treadmill to 15%. Will Continue to monitor Mahdi is doing well in rehab.  He is up to  level 8 on the XR. We will encourage him to increase his weights and continue to montior his progress. Reviewed home exercise with pt today.  Pt plans to walk and join [employer] for exercise.  Reviewed THR, pulse, RPE, sign and symptoms, pulse oximetery and when to call 911 or MD.  Also discussed weather considerations and indoor options.  Pt voiced understanding. Keano is progressing well. he has increased speed and grade on TM.  Staff will continue to monitor progress.   Expected Outcomes Short: Use RPE daily to regulate intensity. Long: Follow program prescription in THR. Short: Continue to build up speed on treadmill Long: Increase overall MET level Short: Try 6 lb handweights Long: continue to improve stamina Short: Add on 1 day of exercise at home and start checking HR Long: Continue to exercise independently at appropriate prescription Short: maintain consistent attendance Long: continue to build stamina    Row Name 10/16/20 0856 10/27/20 1731 11/10/20 0947 11/12/20 1755 11/26/20 1329     Exercise Goal Re-Evaluation   Exercise Goals Review Increase Physical Activity;Increase Strength and Stamina Understanding of Exercise Prescription Increase Physical Activity;Increase Strength and Stamina Increase Physical Activity;Increase Strength and Stamina Increase Physical Activity;Increase Strength and Stamina   Comments Sahith is doing very well in rehab. He is now up to level 7 on the recumbant bike and increased his handweights to 12 lb. Staff should introduce intervals for patient- we will continue to monitor progress. Lamont states that he is not doing much exercise at home like he should be. His plan is to join a gym when he is done with [facility]. Informed him of [facility] which is down the road from him. Keelen is doing well in rehab. He started doing intervals on the treadmill, highest incline at 12.5%. He states he feels well doing it as it gives him more of a challenge. He also increased to level  9 on the XR. Will continue to monitor. Shadd admits he is not exercising at home as much as he should. He is keeping busy around the house but no structured exericse. We went over the difference of exercise and being physically active.  He plans to join [employer] once he graduates from here. He is due for his psot 6MWT and should improve significantly. Dashon improved post walk by 210 feet!   Expected Outcomes Short: Try intervals on the treadmill Long: Continue to increase overall MET level Short: join a gym. Long: maintain working out independently. Short: Continue intervals and try on different machine Long: Continue to increase overall MET level Short: Improve on 6MWT and graduate Long: Exercise at home independently at appropriate exercise prescription Short: complete HT Long: maintain exercise on his own            Discharge Exercise Prescription (Final Exercise Prescription Changes):  Exercise Prescription Changes - 11/26/20 1300       Response to Exercise   Blood Pressure (Admit) 106/64    Blood Pressure (Exit) 104/62    Heart Rate (Admit) 52 bpm    Heart Rate (Exercise) 93 bpm    Heart Rate (Exit) 60 bpm    Rating of Perceived Exertion (Exercise) 11    Symptoms none    Duration Continue with 30 min of aerobic exercise without signs/symptoms of physical distress.    Intensity THRR unchanged      Progression   Progression Continue to progress workloads to maintain intensity without signs/symptoms of physical distress.    Average METs 8      Resistance Training   Training Prescription Yes    Weight 12 lb    Reps 10-15      Interval Training   Interval Training Yes    Equipment Treadmill    Comments 3.3/6.5-12      Home Exercise Plan   Plans to continue exercise at [employer] (comment)   [employer]   Frequency Add 3 additional days to program exercise sessions.   Start with 1 day   Initial Home Exercises Provided 09/22/20             Nutrition:   Target Goals: Understanding of nutrition guidelines, daily intake of sodium <1545m, cholesterol <2046m calories 30% from fat and 7% or less from saturated fats, daily to have 5 or more servings of fruits and vegetables.  Education: All About Nutrition: -Group instruction provided by verbal, written material, interactive activities, discussions, models, and posters to present general guidelines for heart healthy nutrition including fat, fiber, MyPlate, the role of sodium in heart healthy nutrition, utilization of the nutrition label, and utilization of this knowledge for meal planning. Follow up email sent as well. Written material given at graduation. Flowsheet [facility]  Education need identified 08/19/20       Biometrics:  Pre Biometrics - 08/19/20 0940       Pre Biometrics   Height 5' 10.2" (1.783 m)    Weight 218 lb (98.9 kg)    BMI (Calculated) 31.1    Single Leg Stand 30 seconds             Post Biometrics - 11/24/20 1736        Post  Biometrics   Height 5' 10.2" (1.783 m)    Weight 208 lb 11.2 oz (94.7 kg)    BMI (Calculated) 29.78             Nutrition Therapy Plan and Nutrition Goals:  Nutrition Therapy & Goals - 09/25/20 1649       Nutrition Therapy   Diet Heart healthy, low Na, diabetes friendly    Drug/Food Interactions Statins/Certain Fruits  Protein (specify units) 75g    Fiber 30 grams    Whole Grain Foods 3 servings    Saturated Fats 12 max. grams    Fruits and Vegetables 8 servings/day    Sodium 1.5 grams      Personal Nutrition Goals   Nutrition Goal ST: Practice MyPlate and CHO counting (2-3 CHO per snack, 1-2 CHO per snack), CHO with protein and fat LT: 8 fruits and vegetables per day, a rainbow of fruits and vegetables per week, maintain BG during the day    Comments Will drink diet soda. B: triple zero yogurt, activia yogurt (helps with reflux), protein bar  - wants to fix 2-3  scrambled eggs with toast and yogurt S: protein bar L: 2 packs of tuna, crackers, sugar free pudding S: handful on nuts D: variety - last night grilled cheese with tomato soup. He likes salmon and catfish, rarely eats red meat, will eat baked chicken. Will sometimes have mashed potatoes, rice (flavored rice packets), vegetables. Uses Mrs. Dash. Butter or pam. Drinks: tea and lemonade sugar free packets, coffee (sugar free creamer). Discussed heart healthy eating and diabetes friendly eating.      Intervention Plan   Intervention Prescribe, educate and counsel regarding individualized specific dietary modifications aiming towards targeted core components such as weight, hypertension, lipid management, diabetes, heart failure and other comorbidities.    Expected Outcomes Short Term Goal: Understand basic principles of dietary content, such as calories, fat, sodium, cholesterol and nutrients.;Short Term Goal: A plan has been developed with personal nutrition goals set during dietitian appointment.;Long Term Goal: Adherence to prescribed nutrition plan.             Nutrition Assessments:  MEDIFICTS Score Key: ?70 Need to make dietary changes  40-70 Heart Healthy Diet ? 40 Therapeutic Level Cholesterol Diet  Flowsheet Row Cardiac Rehab from 11/27/2020 in [facility] Cardiac and Pulmonary Rehab  Picture Your Plate Total Score on Discharge 63      Picture Your Plate Scores: <74 Unhealthy dietary pattern with much room for improvement. 41-50 Dietary pattern unlikely to meet recommendations for good health and room for improvement. 51-60 More healthful dietary pattern, with some room for improvement.  >60 Healthy dietary pattern, although there may be some specific behaviors that could be improved.    Nutrition Goals Re-Evaluation:  Nutrition Goals Re-Evaluation     [place] Name 09/22/20 1801 10/27/20 1725 11/12/20 1757         Goals   Current Weight -- 210 lb (95.3 kg) --     Nutrition Goal --  Eat dinner earlier. ST: Practice MyPlate and CHO counting (2-3 CHO per snack, 1-2 CHO per snack), CHO with protein and fat LT: 8 fruits and vegetables per day, a rainbow of fruits and vegetables per week, maintain BG during the day     Comment Tiara has not met with the RD to establish goals. He has an appointment with her this Thursday, 7/28. Cuauhtemoc wants to lose weight and the only thing he can think of to do is eat dinner earlier. He watches his sugars being a diabetic. Jahkari says he has cut carlories by decreasing his portion sizes and eating high protein snacks.  He has also lowered the amount of carbs and switched to whole wheat. Denies any other goals or concerns at this time.     Expected Outcome Short: Meet with RD Long: Continue to follow heart healthy diet Short: eat smaller meals. Long: maintain weight loss. Short: Continue to  watch weight and sugats Long: Continue to eat heart healthy diet              Nutrition Goals Discharge (Final Nutrition Goals Re-Evaluation):  Nutrition Goals Re-Evaluation - 11/12/20 1757       Goals   Nutrition Goal ST: Practice MyPlate and CHO counting (2-3 CHO per snack, 1-2 CHO per snack), CHO with protein and fat LT: 8 fruits and vegetables per day, a rainbow of fruits and vegetables per week, maintain BG during the day    Comment Sohan says he has cut carlories by decreasing his portion sizes and eating high protein snacks.  He has also lowered the amount of carbs and switched to whole wheat. Denies any other goals or concerns at this time.    Expected Outcome Short: Continue to watch weight and sugats Long: Continue to eat heart healthy diet             Psychosocial: Target Goals: Acknowledge presence or absence of significant depression and/or stress, maximize coping skills, provide positive support system. Participant is able to verbalize types and ability to use techniques and skills needed for reducing stress and depression.   Education: Stress,  Anxiety, and Depression - Group verbal and visual presentation to define topics covered.  Reviews how body is impacted by stress, anxiety, and depression.  Also discusses healthy ways to reduce stress and to treat/manage anxiety and depression.  Written material given at graduation.   Education: Sleep Hygiene -Provides group verbal and written instruction about how sleep can affect your health.  Define sleep hygiene, discuss sleep cycles and impact of sleep habits. Review good sleep hygiene tips.    Initial Review & Psychosocial Screening:  Initial Psych Review & Screening - 08/11/20 1536       Initial Review   Current issues with Current Stress Concerns      Family Dynamics   Good Support System? Yes   wife, dad, son     Barriers   Psychosocial barriers to participate in program There are no identifiable barriers or psychosocial needs.;The patient should benefit from training in stress management and relaxation.      Screening Interventions   Interventions Encouraged to exercise;To provide support and resources with identified psychosocial needs;Provide feedback about the scores to participant    Expected Outcomes Short Term goal: Utilizing psychosocial counselor, staff and physician to assist with identification of specific Stressors or current issues interfering with healing process. Setting desired goal for each stressor or current issue identified.;Long Term Goal: Stressors or current issues are controlled or eliminated.;Short Term goal: Identification and review with participant of any Quality of Life or Depression concerns found by scoring the questionnaire.;Long Term goal: The participant improves quality of Life and PHQ9 Scores as seen by post scores and/or verbalization of changes             Quality of Life Scores:   Quality of Life - 11/27/20 1714       Quality of Life   Select Quality of Life      Quality of Life Scores   Health/Function Pre 23.87 %     Health/Function Post 24.63 %    Health/Function % Change 3.18 %    Socioeconomic Pre 26 %    Socioeconomic Post 25.5 %    Socioeconomic % Change  -1.92 %    Psych/Spiritual Pre 26.64 %    Psych/Spiritual Post 24.21 %    Psych/Spiritual % Change -9.12 %    Family  Pre 27.6 %    Family Post 24.4 %    Family % Change -11.59 %    GLOBAL Pre 25.44 %    GLOBAL Post 24.69 %    GLOBAL % Change -2.95 %            Scores of 19 and below usually indicate a poorer quality of life in these areas.  A difference of  2-3 points is a clinically meaningful difference.  A difference of 2-3 points in the total score of the Quality of Life Index has been associated with significant improvement in overall quality of life, self-image, physical symptoms, and general health in studies assessing change in quality of life.  PHQ-9: Recent Review Flowsheet Data     Depression screen [facility] 2/9 11/27/2020 08/19/2020 03/14/2020 03/09/2019 01/16/2018   Decreased Interest 1 1 0 0 0   Down, Depressed, Hopeless 0 0 0 0 0   PHQ - 2 Score 1 1 0 0 0   Altered sleeping 1 0 - - -   Tired, decreased energy 1 1 - - -   Change in appetite 0 0 - - -   Feeling bad or failure about yourself  0 0 - - -   Trouble concentrating 0 0 - - -   Moving slowly or fidgety/restless 0 0 - - -   Suicidal thoughts 0 0 - - -   PHQ-9 Score 3 2 - - -   Difficult doing work/chores Not difficult at all Not difficult at all - - -      Interpretation of Total Score  Total Score Depression Severity:  1-4 = Minimal depression, 5-9 = Mild depression, 10-14 = Moderate depression, 15-19 = Moderately severe depression, 20-27 = Severe depression   Psychosocial Evaluation and Intervention:  Psychosocial Evaluation - 08/11/20 1551       Psychosocial Evaluation & Interventions   Interventions Encouraged to exercise with the program and follow exercise prescription;Stress management education;Relaxation education    Comments Dino reports doing okay  after his NSTEMI. He is still struggling with feeling tired and low stamina, but is trying to slowly increase his activity level. 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 He does have twin 51 year olds at home which keep him quite active. He states his wife, older son, and dad are his main support system. He takes Lexapro at night to help him sleep because his mind races sometimes. At his appointment today, his cardiologist placed a holter monitor to evaluate for heart abnormalities since he has been experiencing palpitations and tiredness. He is very motivated to care for his body and is looking forward to coming to cardiac rehab.    Expected Outcomes Short: attend cardiac rehab for education and exercise. Long: develop and maintain positive self care habits.    Continue Psychosocial Services  Follow up required by staff             Psychosocial Re-Evaluation:  Psychosocial Re-Evaluation     [place] Name 09/22/20 5400 10/27/20 1729 11/12/20 1803         Psychosocial Re-Evaluation   Current issues with Current Stress Concerns Current Sleep Concerns Current Stress Concerns     Comments Juwan is doing very well mentally. He did his get his heart monitor and doctor told him there were no major concerns. He feels his palpitations have subsided and agreed to contact doctor if he starts to feel them  again. He is still a little nervous exercising as he worries about his last heart event. He takes Lexapro at night and feels it helps. He has good support from family and friends. He just saw his cardiologist today and reports everything looked good and doesn't have to go back for another 6 months. Does not feel any need for other medications or therapy. Patient takes generic version of lexapro to help him sleep.Patient reports no issues with their current mental states, sleep, stress, depression or anxiety. Will follow up with patient in a few weeks for any changes.  Kinsey denies any concerns at this time. He still takes Lexapro at night to help calm his mind, other than that he feels great. He is still continuously to have stress related to work but feels that he manages well. He has a set of twins that keep him busy. He will be graduating by the end of the month.     Expected Outcomes Short: Continue attendance with rehab Long: Continue to utilize exercise for stress management Short: Continue to exercise regularly to support mental health and notify staff of any changes. Long: maintain mental health and well being through teaching of rehab or prescribed medications independently. Short: Continue consistent attendance with rehab Long: Continue to utilize exercise for stress management and maintain positive attitude     Interventions Encouraged to attend Cardiac Rehabilitation for the exercise Encouraged to attend Cardiac Rehabilitation for the exercise Encouraged to attend Cardiac Rehabilitation for the exercise     Continue Psychosocial Services  Follow up required by staff Follow up required by staff Follow up required by staff              Psychosocial Discharge (Final Psychosocial Re-Evaluation):  Psychosocial Re-Evaluation - 11/12/20 1803       Psychosocial Re-Evaluation   Current issues with Current Stress Concerns    Comments Nikola denies any concerns at this time. He still takes Lexapro at night to help calm his mind, other than that he feels great. He is still continuously to have stress related to work but feels that he manages well. He has a set of twins that keep him busy. He will be graduating by the end of the month.    Expected Outcomes Short: Continue consistent attendance with rehab Long: Continue to utilize exercise for stress management and maintain positive attitude    Interventions Encouraged to attend Cardiac Rehabilitation for the exercise    Continue Psychosocial Services  Follow up required by staff             Vocational  Rehabilitation: Provide vocational rehab assistance to qualifying candidates.   Vocational Rehab Evaluation & Intervention:  Vocational Rehab - 08/11/20 1537       Initial Vocational Rehab Evaluation & Intervention   Assessment shows need for Vocational Rehabilitation No             Education: Education Goals: Education classes will be provided on a variety of topics geared toward better understanding of heart health and risk factor modification. Participant will state understanding/return demonstration of topics presented as noted by education test scores.  Learning Barriers/Preferences:  Learning Barriers/Preferences - 08/11/20 1537       Learning Barriers/Preferences   Learning Barriers None    Learning Preferences None             General Cardiac Education Topics:  AED/CPR: - Group verbal and written instruction with the use of models to demonstrate the basic use of the  AED with the basic ABC's of resuscitation.   Anatomy and Cardiac Procedures: - Group verbal and visual presentation and models provide information about basic cardiac anatomy and function. Reviews the testing methods done to diagnose heart disease and the outcomes of the test results. Describes the treatment choices: Medical Management, Angioplasty, or Coronary Bypass Surgery for treating various heart conditions including Myocardial Infarction, Angina, Valve Disease, and Cardiac Arrhythmias.  Written material given at graduation.   Medication Safety: - Group verbal and visual instruction to review commonly prescribed medications for heart and lung disease. Reviews the medication, class of the drug, and side effects. Includes the steps to properly store meds and maintain the prescription regimen.  Written material given at graduation.   Intimacy: - Group verbal instruction through game format to discuss how heart and lung disease can affect sexual intimacy. Written material given at  graduation..   Know Your Numbers and Heart Failure: - Group verbal and visual instruction to discuss disease risk factors for cardiac and pulmonary disease and treatment options.  Reviews associated critical values for Overweight/Obesity, Hypertension, Cholesterol, and Diabetes.  Discusses basics of heart failure: signs/symptoms and treatments.  Introduces Heart Failure Zone chart for action plan for heart failure.  Written material given at graduation. Flowsheet Row Cardiac Rehab from 08/19/2020 in [facility] Cardiac and Pulmonary Rehab  Education need identified 08/19/20       Infection Prevention: - Provides verbal and written material to individual with discussion of infection control including proper hand washing and proper equipment cleaning during exercise session. Flowsheet Row Cardiac Rehab from 08/19/2020 in [facility] Cardiac and Pulmonary Rehab  Date 08/19/20  Educator [facility]  Instruction Review Code 1- Verbalizes Understanding       Falls Prevention: - Provides verbal and written material to individual with discussion of falls prevention and safety. Flowsheet Row Cardiac Rehab from 08/19/2020 in [facility] Cardiac and Pulmonary Rehab  Date 08/19/20  Educator [facility]  Instruction Review Code 1- Verbalizes Understanding       Other: -Provides group and verbal instruction on various topics (see comments)   Knowledge Questionnaire Score:  Knowledge Questionnaire Score - 11/27/20 1714       Knowledge Questionnaire Score   Post Score 25/26             Core Components/Risk Factors/Patient Goals at Admission:  Personal Goals and Risk Factors at Admission - 08/19/20 0942       Core Components/Risk Factors/Patient Goals on Admission    Weight Management Yes;Obesity;Weight Loss    Intervention Weight Management: Develop a combined nutrition and exercise program designed to reach desired caloric intake, while maintaining appropriate intake of nutrient and fiber, sodium and fats, and  appropriate energy expenditure required for the weight goal.;Weight Management: Provide education and appropriate resources to help participant work on and attain dietary goals.;Weight Management/Obesity: Establish reasonable short term and long term weight goals.;Obesity: Provide education and appropriate resources to help participant work on and attain dietary goals.    Admit Weight 218 lb (98.9 kg)    Goal Weight: Short Term 210 lb (95.3 kg)    Goal Weight: Long Term 200 lb (90.7 kg)    Expected Outcomes Short Term: Continue to assess and modify interventions until short term weight is achieved;Long Term: Adherence to nutrition and physical activity/exercise program aimed toward attainment of established weight goal;Weight Loss: Understanding of general recommendations for a balanced deficit meal plan, which promotes 1-2 lb weight loss per week and includes a negative energy balance of [phone]  kcal/d;Understanding recommendations for meals to include 15-35% energy as protein, 25-35% energy from fat, 35-60% energy from carbohydrates, less than 255m of dietary cholesterol, 20-35 gm of total fiber daily;Understanding of distribution of calorie intake throughout the day with the consumption of 4-5 meals/snacks    Diabetes Yes    Intervention Provide education about signs/symptoms and action to take for hypo/hyperglycemia.;Provide education about proper nutrition, including hydration, and aerobic/resistive exercise prescription along with prescribed medications to achieve blood glucose in normal ranges: Fasting glucose 65-99 mg/dL    Expected Outcomes Short Term: Participant verbalizes understanding of the signs/symptoms and immediate care of hyper/hypoglycemia, proper foot care and importance of medication, aerobic/resistive exercise and nutrition plan for blood glucose control.;Long Term: Attainment of HbA1C < 7%.    Hypertension Yes    Intervention Provide education on lifestyle modifcations including  regular physical activity/exercise, weight management, moderate sodium restriction and increased consumption of fresh fruit, vegetables, and low fat dairy, alcohol moderation, and smoking cessation.;Monitor prescription use compliance.    Expected Outcomes Short Term: Continued assessment and intervention until BP is < 140/939mHG in hypertensive participants. < 130/8034mG in hypertensive participants with diabetes, heart failure or chronic kidney disease.;Long Term: Maintenance of blood pressure at goal levels.    Lipids Yes    Intervention Provide education and support for participant on nutrition & aerobic/resistive exercise along with prescribed medications to achieve LDL <74m54mDL >40mg33m Expected Outcomes Short Term: Participant states understanding of desired cholesterol values and is compliant with medications prescribed. Participant is following exercise prescription and nutrition guidelines.;Long Term: Cholesterol controlled with medications as prescribed, with individualized exercise RX and with personalized nutrition plan. Value goals: LDL < 74mg,9m > 40 mg.             Education:Diabetes - Individual verbal and written instruction to review signs/symptoms of diabetes, desired ranges of glucose level fasting, after meals and with exercise. Acknowledge that pre and post exercise glucose checks will be done for 3 sessions at entry of program. [place]  Date 08/11/20  Educator [facility] Review Code 1- Verbalizes Understanding       Core Components/Risk Factors/Patient Goals Review:   Goals and Risk Factor Review     Row Name 09/22/20 1742 10/27/20 1727 11/12/20 1805         Core Components/Risk Factors/Patient Goals Review   Personal Goals Review Weight Management/Obesity;Hypertension;Diabetes;Lipids Weight Management/Obesity;Hypertension Weight Management/Obesity;Hypertension;Diabetes;Heart  Failure     Review Chang iKobieing well. He just saw cardiologist today and reports everything went well. He is checking his weight every morning - right now he is 210 lb- wants to get down to 195 lb. He is meeting with the RD soon to discuss goals. [place] cDominica Severins BP routinely which ranges between 110s s732Klic and  60/70s02/54Yolic. Staying compliant withall  medications. [facility]. Reports 100-120 fasting. He notices a spike when he eats heavy breads or biscuits. He is going to meet with the RD this Thursday to learn about a diabetic friendly diet. Last good newd he heard about his cholesterol was his total cholesterol was 113! Lydia sEdyns that his blood pressure has been good at home. His blood pressure has been good in the program thus far. He still wants to lose weight and get down to 195 pounds. Welcome's blood sugars have ranged between 80-180. His normal fasting blood sugar is  115 and his doctor wants it below 130. Marland Kitchen He is staying compliant with all his medications. He denies any symptoms of heart failure at this time and tolerates his exercise very well. BPs have been checked at home and staying stable, he has had a couple low ones but is not symptomatic. Weight has been stable, though he'd like to lose more.     Expected Outcomes Short: Work on weight loss with appropriate diet changes Long: Continue to manage lifestyle risk factors Short: lose more weight. Long: reach weight goal of 195 pounds. Short: Continue to check sugars and weight at home Long: Continue to manage lifestyle risk factors              Core Components/Risk Factors/Patient Goals at Discharge (Final Review):   Goals and Risk Factor Review - 11/12/20 1805       Core Components/Risk Factors/Patient Goals Review   Personal Goals Review Weight Management/Obesity;Hypertension;Diabetes;Heart Failure    Review Freddrick's blood sugars have ranged between 80-180. His normal fasting blood sugar  is 115 and his doctor wants it below 130. Marland Kitchen He is staying compliant with all his medications. He denies any symptoms of heart failure at this time and tolerates his exercise very well. BPs have been checked at home and staying stable, he has had a couple low ones but is not symptomatic. Weight has been stable, though he'd like to lose more.    Expected Outcomes Short: Continue to check sugars and weight at home Long: Continue to manage lifestyle risk factors             ITP Comments:  ITP Comments     Row Name 08/11/20 1554 08/19/20 0931 08/25/20 0738 08/25/20 0931 09/10/20 0912   ITP Comments Initial telephone orientation completed. Diagnosis can be found in [facility] 5/27. EP orientation scheduled for Tuesday 6/21 at 8am. Completed 6MWT and gym orientation. Initial ITP created and sent for review to Dr. Emily Filbert, Medical Director. Error  Quanah has not been diascharged.  Wrong Chart First full day of exercise!  Patient was oriented to gym and equipment including functions, settings, policies, and procedures.  Patient's individual exercise prescription and treatment plan were reviewed.  All starting workloads were established based on the results of the 6 minute walk test done at initial orientation visit.  The plan for exercise progression was also introduced and progression will be customized based on patient's performance and goals. 30 Day review completed. Medical Director ITP review done, changes made as directed, and signed approval by Medical Director.    [place] Name 10/08/20 0734 11/05/20 0636 11/27/20 1717       ITP Comments 30 Day review completed. Medical Director ITP review done, changes made as directed, and signed approval by Medical Director. 30 Day review completed. Medical Director ITP review done, changes made as directed, and signed approval by Medical Director. Usbaldo graduated today from  rehab with 35 sessions completed.  Details of the patient's exercise prescription and what He needs  to do in order to continue the prescription and progress were discussed with patient.  Patient was given a copy of prescription and goals.  Patient verbalized understanding.  Ngai plans to continue to exercise by walking at home and then join a gym soon.              Comments: Discharge ITP

## 2020-11-28 ENCOUNTER — Other Ambulatory Visit: Payer: Self-pay | Admitting: Physician Assistant

## 2020-11-28 DIAGNOSIS — I25118 Atherosclerotic heart disease of native coronary artery with other forms of angina pectoris: Secondary | ICD-10-CM

## 2020-11-28 DIAGNOSIS — Z951 Presence of aortocoronary bypass graft: Secondary | ICD-10-CM

## 2020-11-28 DIAGNOSIS — R5383 Other fatigue: Secondary | ICD-10-CM

## 2020-11-28 DIAGNOSIS — I509 Heart failure, unspecified: Secondary | ICD-10-CM

## 2020-12-02 ENCOUNTER — Ambulatory Visit (INDEPENDENT_AMBULATORY_CARE_PROVIDER_SITE_OTHER): Payer: No Typology Code available for payment source

## 2020-12-02 ENCOUNTER — Other Ambulatory Visit: Payer: Self-pay

## 2020-12-02 DIAGNOSIS — R5383 Other fatigue: Secondary | ICD-10-CM | POA: Diagnosis not present

## 2020-12-02 DIAGNOSIS — I25118 Atherosclerotic heart disease of native coronary artery with other forms of angina pectoris: Secondary | ICD-10-CM | POA: Diagnosis not present

## 2020-12-02 DIAGNOSIS — Z951 Presence of aortocoronary bypass graft: Secondary | ICD-10-CM

## 2020-12-02 DIAGNOSIS — I509 Heart failure, unspecified: Secondary | ICD-10-CM | POA: Diagnosis not present

## 2020-12-02 LAB — ECHOCARDIOGRAM COMPLETE
AR max vel: 2.88 cm2
AV Area VTI: 2.66 cm2
AV Area mean vel: 2.65 cm2
AV Mean grad: 4 mmHg
AV Peak grad: 7.4 mmHg
Ao pk vel: 1.36 m/s
Area-P 1/2: 5.13 cm2
S' Lateral: 4.3 cm

## 2020-12-02 MED ORDER — PERFLUTREN LIPID MICROSPHERE
1.0000 mL | INTRAVENOUS | Status: AC | PRN
Start: 2020-12-02 — End: 2020-12-02
  Administered 2020-12-02: 2 mL via INTRAVENOUS

## 2020-12-04 ENCOUNTER — Telehealth: Payer: Self-pay | Admitting: Physician Assistant

## 2020-12-04 NOTE — Telephone Encounter (Addendum)
Reviewed echo in detail and with reference to recent monitor results and EP visit (and recommendations).   He had left his WatchPat on the top of the fridge and forgotten about it. He will be sure to complete this study and appreciates the echo information, as he was starting to wonder if he needed it.  He will proceed with cardiac MRI and EP RTC.     ----- Message from Arvil Chaco, PA-C sent at 12/04/2020  8:35 AM EDT ----- Echo shows  --Reduced pump function or heart squeeze  --Bottom L pumping chamber of the heart is not moving as well as it should, mildly larger in size, mildly stiff to relax, and with mild thickening of the heart walls.  --Top L chamber moderately larger in size, which is often seen over time with untreated sleep apnea. --Mildly leaky mitral valve which is not concerning and which we can continue to monitor.   Recommend  --Continue current meds --Cardiac MRI, as EP has recommended/ordered --Continue to follow with EP --WatchPat study (already ordered)

## 2020-12-11 ENCOUNTER — Other Ambulatory Visit (HOSPITAL_COMMUNITY): Payer: Self-pay | Admitting: Emergency Medicine

## 2020-12-11 ENCOUNTER — Telehealth (HOSPITAL_COMMUNITY): Payer: Self-pay | Admitting: Emergency Medicine

## 2020-12-11 DIAGNOSIS — I255 Ischemic cardiomyopathy: Secondary | ICD-10-CM

## 2020-12-11 NOTE — Progress Notes (Signed)
H&H ordered for CMR for ECV calculation Marchia Bond RN Navigator Cardiac Imaging [facility] [phone] Office  [phone] Cell

## 2020-12-11 NOTE — Telephone Encounter (Signed)
Reaching out to patient to offer assistance regarding upcoming cardiac imaging study; pt verbalizes understanding of appt date/time, parking situation and where to check in, and verified current allergies; name and call back number provided for further questions should they arise Marchia Bond RN Navigator Cardiac Imaging [facility] [phone] office [phone] cell  Denies claustro Denies iv issues Hx of CABG so he has sternal wires and clips Typically wears dexcom but instructed him not to place new sensor until scan is complete

## 2020-12-12 ENCOUNTER — Other Ambulatory Visit
Admission: RE | Admit: 2020-12-12 | Discharge: 2020-12-12 | Disposition: A | Discharge status: Home / Self Care | Payer: No Typology Code available for payment source | Attending: Cardiology | Admitting: Cardiology

## 2020-12-12 DIAGNOSIS — I255 Ischemic cardiomyopathy: Secondary | ICD-10-CM | POA: Insufficient documentation

## 2020-12-12 LAB — HEMOGLOBIN AND HEMATOCRIT, BLOOD
HCT: 42.2 % (ref 39.0–52.0)
Hemoglobin: 14.5 g/dL (ref 13.0–17.0)

## 2020-12-16 ENCOUNTER — Other Ambulatory Visit: Payer: Self-pay

## 2020-12-16 ENCOUNTER — Other Ambulatory Visit: Payer: Self-pay | Admitting: Medical

## 2020-12-16 ENCOUNTER — Ambulatory Visit (HOSPITAL_COMMUNITY)
Admission: RE | Admit: 2020-12-16 | Discharge: 2020-12-16 | Disposition: A | Discharge status: Home / Self Care | Payer: No Typology Code available for payment source | Source: Ambulatory Visit | Attending: Cardiology | Admitting: Cardiology

## 2020-12-16 DIAGNOSIS — I5042 Chronic combined systolic (congestive) and diastolic (congestive) heart failure: Secondary | ICD-10-CM

## 2020-12-16 DIAGNOSIS — I255 Ischemic cardiomyopathy: Secondary | ICD-10-CM | POA: Diagnosis present

## 2020-12-16 MED ORDER — GADOBUTROL 1 MMOL/ML IV SOLN
13.0000 mL | Freq: Once | INTRAVENOUS | Status: AC | PRN
  Administered 2020-12-16: 13 mL via INTRAVENOUS

## 2020-12-24 ENCOUNTER — Ambulatory Visit (INDEPENDENT_AMBULATORY_CARE_PROVIDER_SITE_OTHER): Payer: No Typology Code available for payment source | Admitting: Cardiology

## 2020-12-24 ENCOUNTER — Encounter: Payer: Self-pay | Admitting: Cardiology

## 2020-12-24 ENCOUNTER — Other Ambulatory Visit: Payer: Self-pay

## 2020-12-24 VITALS — BP 120/80 | HR 48 | Ht 70.0 in | Wt 211.0 lb

## 2020-12-24 DIAGNOSIS — Z951 Presence of aortocoronary bypass graft: Secondary | ICD-10-CM | POA: Diagnosis not present

## 2020-12-24 DIAGNOSIS — I5042 Chronic combined systolic (congestive) and diastolic (congestive) heart failure: Secondary | ICD-10-CM | POA: Diagnosis not present

## 2020-12-24 DIAGNOSIS — I25118 Atherosclerotic heart disease of native coronary artery with other forms of angina pectoris: Secondary | ICD-10-CM | POA: Diagnosis not present

## 2020-12-24 NOTE — Progress Notes (Signed)
Electrophysiology Office Follow up Visit Note:    Date:  12/24/2020   ID:  Todd Kelley, DOB 1965-05-28, MRN [id number]  PCP:  Carlena Hurl, PA-C  [facility] Cardiologist:  Ida Rogue, MD  [place] Electrophysiologist:  Vickie Epley, MD    Interval History:    Todd Kelley is a 55 y.o. male who presents for a follow up visit. They were last seen in clinic November 12, 2020.  The initial consultation was for chronic systolic heart failure with an ejection fraction of 35 to 40%.  At that appointment I ordered a cardiac MRI to better assess the ejection fraction and for evidence of LGE.  He has done well since I last saw him.  He continues to be very active.     Past Medical History:  Diagnosis Date   Acid reflux    Allergic asthma    rare flare up, worse with alllergies as of 12/2016   Coronary artery disease    a. 08/1998 Lateral MI/PCI/BMS [place]; b. 2001 PTCA x 2 2/2 ISR in [place]; c. 2002 PCI/brachyrx 2/2 ISR in [place]; d. 01/2001 CABG x 4: LIMA->LAD, RIMA->RI, VG->RPL->RPDA; e. 01/2014 MV: Low risk w/ attenuation artifact and mild inf basal isch. Nl EF->Med rx.   DVT (deep venous thrombosis) ([place])    a. 01/2001 after CABG.   Erectile dysfunction    Hyperlipidemia LDL goal <70    Hypertension    Hypothyroidism    Palpitations    Pericardial effusion    a. 01/2001 Effusion w/ tamponade req window following CABG.   Tobacco use    vape, no cigarrette since 04/2015   Type II diabetes mellitus ([place])     Past Surgical History:  Procedure Laterality Date   APPENDECTOMY  2001   CARDIAC CATHETERIZATION  2000   stent   COLONOSCOPY WITH PROPOFOL N/A 03/17/2018   Procedure: COLONOSCOPY WITH PROPOFOL;  Surgeon: Lucilla Lame, MD;  Location: [place];  Service: Endoscopy;  Laterality: N/A;   CORONARY ANGIOPLASTY  jan. 2001   CORONARY ANGIOPLASTY WITH STENT PLACEMENT  june 2001   stent   CORONARY ARTERY BYPASS GRAFT  dec. 2002   LIMA graft to LAD,RIMA graft to  the intermediate branch and saphenous vein graft  to the posterior lateral and posterior descending branches of the right coronary   LEFT HEART CATH AND CORS/GRAFTS ANGIOGRAPHY N/A 07/28/2020   Procedure: LEFT HEART CATH AND CORS/GRAFTS ANGIOGRAPHY;  Surgeon: Wellington Hampshire, MD;  Location: [place] [facility];  Service: Cardiovascular;  Laterality: N/A;   POLYPECTOMY  03/17/2018   Procedure: POLYPECTOMY;  Surgeon: Lucilla Lame, MD;  Location: [facility];  Service: Endoscopy;;    Current Medications: Current Meds  Medication Sig   aspirin (ASPIRIN LOW DOSE) 81 MG EC tablet Take 1 tablet (81 mg total) by mouth daily. Swallow whole.   atorvastatin (LIPITOR) 10 MG tablet Take 1 tablet (10 mg total) by mouth daily.   cetirizine (ZYRTEC) 10 MG tablet Take 10 mg by mouth daily.   clopidogrel (PLAVIX) 75 MG tablet Take 1 tablet (75 mg total) by mouth daily with breakfast.   Continuous Blood Gluc Receiver (DEXCOM G6 RECEIVER) DEVI Use as instructed   Continuous Blood Gluc Sensor (DEXCOM G6 SENSOR) MISC Use for every 14 days.   Continuous Blood Gluc Transmit (DEXCOM G6 TRANSMITTER) MISC USE AS INSTRUCTED   escitalopram (LEXAPRO) 10 MG tablet Take 1 tablet (10 mg total) by mouth daily.   ezetimibe (  ZETIA) 10 MG tablet Take 1 tablet (10 mg total) by mouth daily.   fenofibrate (TRICOR) 145 MG tablet Take 1 tablet (145 mg total) by mouth daily.   JARDIANCE 10 MG TABS tablet TAKE 1 TABLET BY MOUTH DAILY BEFORE BREAKFAST.   levothyroxine (SYNTHROID) 150 MCG tablet TAKE 1 TABLET BY MOUTH DAILY BEFORE BREAKFAST.   metFORMIN (GLUCOPHAGE XR) 500 MG 24 hr tablet Take 1 tablet (500 mg total) by mouth daily with breakfast.   Multiple Vitamins-Minerals (MULTIVITAMIN ADULT) CHEW Chew 1 tablet by mouth daily.   nitroGLYCERIN (NITROSTAT) 0.4 MG SL tablet Place 1 tablet (0.4 mg total) under the tongue every 5 (five) minutes as needed for chest pain.   potassium chloride (KLOR-CON) 10 MEQ tablet Take 1 tablet  (10 mEq total) by mouth daily.   PROAIR RESPICLICK 194 (90 Base) MCG/ACT AEPB USE 2 INHALATIONS BY MOUTH  EVERY 6 HOURS AS NEEDED   sacubitril-valsartan (ENTRESTO) 24-26 MG Take 1 tablet by mouth 2 (two) times daily.   tadalafil (CIALIS) 5 MG tablet Take 5 mg by mouth daily as needed for erectile dysfunction.   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   Drug use: No   Sexual activity: Not on file  Other Topics Concern   Not on file  Social History Narrative   Lives with wife, 3 dogs, 3 children, and 2 twins .  Exercise - walking , hunting.   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   As of 03/2019   Social Determinants of Health   Financial Resource Strain: Not on file  Food Insecurity: Not on file  Transportation Needs: Not on file  Physical Activity: Not on file  Stress: Not on file  Social Connections: Not on file     Family History: The patient's family history includes Asthma in his brother; Cancer in his mother and paternal grandfather; Cancer (age of onset: 99) in his brother;  Diabetes in his paternal grandmother; Heart disease in his father and sister; Hypertension in his father; Liver cancer in his mother; Liver disease in his brother; Multiple sclerosis in his sister; Suicidality in his brother; Thyroid disease in his brother; Valvular heart disease in his father. There is no history of Prostate cancer or Kidney cancer.  ROS:   Please see the history of present illness.    All other systems reviewed and are negative.  EKGs/Labs/Other Studies Reviewed:    The following studies were reviewed today:  December 16, 2020 cardiac MRI IMPRESSION: 1.  Moderately reduced LV systolic function, LVEF 39 %. 2. Delayed enhancement (subendocardial scar) in the LV basal to mid anterolateral wall (50-75% wall thickness) and mid to apical inferoseptal wall (25% wall thickness). 3.  Percentage scar/fibrosis to total myocardium is 9%. 4. Anterolateral wall does not appear viable, low chance for recovery. 5.  Inferoseptal wall appears viable. 6.  Findings consistent with ischemic cardiomyopathy.      EKG:  The ekg ordered today demonstrates sinus bradycardia with a ventricular rate of 48 bpm.  Left axis deviation  Recent Labs: 07/27/2020: Magnesium 1.9 09/15/2020: ALT 19; BUN 21; Creatinine, Ser 1.11; Platelets 230; Potassium 3.8; Sodium 137; TSH 4.817 12/12/2020: Hemoglobin 14.5  Recent Lipid Panel    Component Value Date/Time   CHOL 113 09/15/2020 0811   CHOL 125 03/14/2020 1008   TRIG 63 09/15/2020 0811   HDL 33 (L) 09/15/2020 0811   HDL 39 (L) 03/14/2020 1008   CHOLHDL 3.4 09/15/2020 0811   VLDL 13 09/15/2020 0811   LDLCALC 67 09/15/2020 0811   LDLCALC 70 03/14/2020 1008   LDLCALC 125 (H) 01/14/2017 1009   LDLDIRECT 68.4 09/15/2020 0811    Physical Exam:    VS:  BP 120/80 (BP Location: Left Arm, Patient Position: Sitting, Cuff Size: Normal)   Pulse (!) 48   Ht 5\' 10"  (1.778 m)   Wt 211 lb (95.7 kg)   SpO2 98%   BMI 30.28 kg/m     Wt Readings from  Last 3 Encounters:  12/24/20 211 lb (95.7 kg)  11/24/20 208 lb 11.2 oz (94.7 kg)  11/12/20 211 lb (95.7 kg)     GEN:  Well nourished, well developed in no acute distress HEENT: Normal NECK: No JVD; No carotid bruits LYMPHATICS: No lymphadenopathy CARDIAC: RRR, no murmurs, rubs, gallops RESPIRATORY:  Clear to auscultation without rales, wheezing or rhonchi  ABDOMEN: Soft, non-tender, non-distended MUSCULOSKELETAL:  No edema; No deformity  SKIN: Warm and dry NEUROLOGIC:  Alert and oriented x 3 PSYCHIATRIC:  Normal affect        ASSESSMENT:    1. Chronic combined systolic and diastolic congestive heart failure ([place])   2. S/P CABG (coronary artery bypass graft)   3. Coronary artery disease of native artery of native heart with stable angina pectoris (HCC)    PLAN:    In order of problems listed above:  #Chronic combined systolic and diastolic heart failure secondary to ischemic cardiomyopathy NYHA class II.  Warm dry on exam.  On Entresto, torsemide, Jardiance.  MRI recently showed an ejection fraction of 39% with 9% LGE burden.  We discussed his risk for sudden cardiac death during today's appointment.  Given his ejection fraction in the 35 to 40% range with 9% LGE, guidelines would suggest further restratification with EP study with a ventricular stimulation protocol.  I agree this would be the best option for him for further risk stratification.  This was my recommendation today.  After discussion with the patient, he would like to avoid ICD for now and continue close monitoring of his left ventricular function.  I think this is a reasonable strategy.  Would plan on annual echoes.  If we noticed a change in his ejection fraction, would favor proceeding with ICD implant.  Follow-up 1 year with echo.  #Coronary artery disease No ischemic symptoms.  Continue aspirin and Plavix.  Total time spent with patient today 47 minutes. This includes reviewing records, evaluating  the patient and coordinating care.   Medication Adjustments/Labs and Tests Ordered: Current medicines are reviewed at length with the patient today.  Concerns regarding medicines are outlined above.  No orders of the defined types were placed in this encounter.  No orders of the defined types were placed in this encounter.    Signed, Lars Mage, MD, [facility], [facility] 12/24/2020 10:01 AM    Electrophysiology [facility]

## 2021-01-09 NOTE — Addendum Note (Signed)
Addended by: Willeen Cass A on: 01/09/2021 08:43 AM   Modules accepted: Orders

## 2021-02-16 ENCOUNTER — Other Ambulatory Visit: Payer: Self-pay | Admitting: Medical

## 2021-03-13 ENCOUNTER — Ambulatory Visit: Payer: No Typology Code available for payment source | Admitting: Medical

## 2021-03-17 ENCOUNTER — Other Ambulatory Visit: Payer: Self-pay | Admitting: Cardiovascular Disease

## 2021-03-19 ENCOUNTER — Encounter: Payer: No Typology Code available for payment source | Admitting: Medical

## 2021-03-19 ENCOUNTER — Other Ambulatory Visit: Payer: Self-pay | Admitting: Medical

## 2021-03-19 NOTE — Telephone Encounter (Signed)
Looks like he established with a new doctor at [facility]

## 2021-03-21 ENCOUNTER — Other Ambulatory Visit: Payer: Self-pay | Admitting: Medical

## 2021-03-25 ENCOUNTER — Other Ambulatory Visit: Payer: Self-pay

## 2021-03-25 ENCOUNTER — Ambulatory Visit (INDEPENDENT_AMBULATORY_CARE_PROVIDER_SITE_OTHER): Payer: No Typology Code available for payment source | Admitting: Cardiovascular Disease

## 2021-03-25 ENCOUNTER — Encounter: Payer: Self-pay | Admitting: Cardiovascular Disease

## 2021-03-25 VITALS — BP 110/70 | HR 46 | Ht 69.5 in | Wt 213.0 lb

## 2021-03-25 DIAGNOSIS — I10 Essential (primary) hypertension: Secondary | ICD-10-CM | POA: Diagnosis not present

## 2021-03-25 DIAGNOSIS — I25118 Atherosclerotic heart disease of native coronary artery with other forms of angina pectoris: Secondary | ICD-10-CM

## 2021-03-25 DIAGNOSIS — E785 Hyperlipidemia, unspecified: Secondary | ICD-10-CM

## 2021-03-25 DIAGNOSIS — Z951 Presence of aortocoronary bypass graft: Secondary | ICD-10-CM | POA: Diagnosis not present

## 2021-03-25 DIAGNOSIS — T466X5A Adverse effect of antihyperlipidemic and antiarteriosclerotic drugs, initial encounter: Secondary | ICD-10-CM

## 2021-03-25 DIAGNOSIS — T466X5D Adverse effect of antihyperlipidemic and antiarteriosclerotic drugs, subsequent encounter: Secondary | ICD-10-CM

## 2021-03-25 DIAGNOSIS — E118 Type 2 diabetes mellitus with unspecified complications: Secondary | ICD-10-CM

## 2021-03-25 DIAGNOSIS — M609 Myositis, unspecified: Secondary | ICD-10-CM

## 2021-03-25 MED ORDER — EMPAGLIFLOZIN 10 MG PO TABS: ORAL_TABLET | ORAL | 6 refills | Status: DC

## 2021-03-25 NOTE — Progress Notes (Signed)
Cardiology Office Note  Date:  03/25/2021   ID:  Todd Kelley, Todd Kelley 19-Jun-1965, MRN [id number]  PCP:  System, Provider Not In   Chief Complaint  Patient presents with   6 month followu up    Patient c/o chest pain that comes and goes. Medications reviewed by the patient verbally.     HPI:  Todd Kelley is a 56 year old gentleman with  Coronary artery disease  lateral MI in 2000,  CABG times 01 February 2001,  poor cardiology follow-up, medication noncompliance,  obesity, presenting to the hospital May 2022 with unstable angina/non-STEMI Ejection fraction 35% to 40%in 10/22 Not on beta-blocker secondary to junctional bradycardia Who presents to the office for follow-up of his coronary disease  Last seen in clinic July 2022  Seen by EP December 24, 2020 MRI recently showed an ejection fraction of 39% with 9% LGE burden. 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  Severe underlying three-vessel coronary artery disease with patent grafts including LIMA to LAD, RIMA to ramus and SVG to right PDA.  There is significant mid to distal LAD stenosis proximal to the LIMA anastomosis, significant ostial left circumflex disease but the left circumflex distribution is relatively small.  There is moderate proximal disease affecting the SVG to right PDA. 2.  Moderately reduced  LV systolic function with an EF of 35 to 40% with global hypokinesis. 3.  Normal left ventricular end-diastolic pressure.   Recommendations: No clear culprit is identified for the patient's non-ST elevation myocardial infarction.  Vessel occlusion with spontaneous recanalization is a possibility especially in the SVG to right PDA.  It was recommended Plavix for at least 1 year  Echocardiogram 10/28/2020  1. Left ventricular ejection fraction, by estimation, is 35 to 40%. The  left ventricle has moderately decreased function. The left ventricle  demonstrates regional wall motion abnormalities (Hypokinesis of the  anterior and anteroseptal wall). There is  moderate left ventricular hypertrophy. Left ventricular diastolic  parameters are consistent with Grade I diastolic dysfunction (impaired  relaxation).   2. Right ventricular systolic function is normal. The right ventricular  size is normal.   3. Left atrial size was mildly dilated.   4. Frequent PVCs   Total cholesterol arrival to the hospital 207 on Jul 26, 2020 Repeat total cholesterol September 15, 2020 down to 113 A1C 7.3  ZIO monitor Normal sinus rhythm 12 Ventricular Tachycardia runs occurred, the run with the fastest interval lasting 17 beats with a max rate of 176 bpm (avg 154 bpm); the run with the fastest interval was also the longest.  Idioventricular Rhythm was present. Junctional Rhythm was present.  Isolated SVEs were rare (<1.0%, 883), SVE Couplets were rare (<1.0%, 44), and no SVE Triplets were present. Isolated VEs were occasional (4.9%, [id number]), VE Couplets were occasional (1.5%, 6109), and VE Triplets were rare (<1.0%, 2221).  Ventricular Bigeminy and Trigeminy were present.  No BB given h/o junctional bradycardia.    PMH:   has a past medical history of Acid reflux, Allergic asthma, Coronary artery disease, DVT (deep venous thrombosis) ([place]), Erectile dysfunction, Hyperlipidemia LDL goal <70, Hypertension,  Hypothyroidism, Palpitations, Pericardial effusion, Tobacco use, and Type II diabetes mellitus ([place]).  PSH:    Past Surgical History:  Procedure Laterality Date   APPENDECTOMY  2001   CARDIAC CATHETERIZATION  2000   stent   COLONOSCOPY WITH PROPOFOL N/A 03/17/2018   Procedure: COLONOSCOPY WITH PROPOFOL;  Surgeon: Lucilla Lame, MD;  Location: [place];  Service: Endoscopy;  Laterality: N/A;   CORONARY ANGIOPLASTY  jan. 2001   CORONARY ANGIOPLASTY WITH STENT PLACEMENT  june 2001   stent   CORONARY ARTERY BYPASS GRAFT  dec. 2002   LIMA graft to LAD,RIMA graft to the intermediate branch and saphenous vein graft  to the posterior lateral and posterior descending branches of the right coronary   LEFT HEART CATH AND CORS/GRAFTS ANGIOGRAPHY N/A 07/28/2020   Procedure: LEFT HEART CATH AND CORS/GRAFTS ANGIOGRAPHY;  Surgeon: Wellington Hampshire, MD;  Location: [place] [facility];  Service: Cardiovascular;  Laterality: N/A;   POLYPECTOMY  03/17/2018   Procedure: POLYPECTOMY;  Surgeon: Lucilla Lame, MD;  Location: [facility];  Service: Endoscopy;;    Current Outpatient Medications  Medication Sig Dispense Refill   aspirin (ASPIRIN LOW DOSE) 81 MG EC tablet Take 1 tablet (81 mg total) by mouth daily. Swallow whole. 90 tablet 3   atorvastatin (LIPITOR) 10 MG tablet Take 1 tablet (10 mg total) by mouth daily. 30 tablet 11   cetirizine (ZYRTEC) 10 MG tablet Take 10 mg by mouth daily.     clopidogrel (PLAVIX) 75 MG tablet Take 1 tablet (75 mg total) by mouth daily with breakfast. 30 tablet 11   Continuous Blood Gluc Receiver (DEXCOM G6 RECEIVER) DEVI Use as instructed 1 each 0   Continuous Blood Gluc Sensor (DEXCOM G6 SENSOR) MISC Use for every 14 days. 3 each 2   Continuous Blood Gluc Transmit (DEXCOM G6 TRANSMITTER) MISC USE AS INSTRUCTED 1 each 0   escitalopram (LEXAPRO) 10 MG tablet Take 1 tablet (10 mg total) by mouth daily. 90 tablet 3   ezetimibe (ZETIA) 10 MG tablet Take 1  tablet (10 mg total) by mouth daily. 30 tablet 11   fenofibrate (TRICOR) 145 MG tablet Take 1 tablet (145 mg total) by mouth daily. 90 tablet 1   JARDIANCE 10 MG TABS tablet TAKE 1 TABLET BY MOUTH EVERY DAY BEFORE BREAKFAST 30 tablet 0   levothyroxine (SYNTHROID) 150 MCG tablet TAKE 1 TABLET BY MOUTH DAILY BEFORE BREAKFAST. 90 tablet 1   metFORMIN (GLUCOPHAGE XR) 500 MG 24 hr tablet Take 1 tablet (500 mg total) by mouth daily with breakfast. 30 tablet 11   Multiple Vitamins-Minerals (MULTIVITAMIN ADULT) CHEW Chew 1 tablet by mouth daily.     nitroGLYCERIN (NITROSTAT) 0.4 MG SL tablet Place 1 tablet (0.4 mg total) under the tongue every 5 (five) minutes as needed for chest pain. 25 tablet 4   potassium chloride (KLOR-CON) 10 MEQ tablet Take 1 tablet (10 mEq total) by mouth daily. 90 tablet 3   PROAIR RESPICLICK 161 (90 Base) MCG/ACT AEPB USE 2 INHALATIONS BY MOUTH  EVERY 6 HOURS AS NEEDED 1 each 0   sacubitril-valsartan (ENTRESTO) 24-26 MG Take 1 tablet by mouth 2 (two) times daily. 60 tablet 11   tadalafil (CIALIS) 5 MG tablet TAKE  1 TABLET DAILY 90 tablet 0   torsemide (DEMADEX) 20 MG tablet TAKE 1 TABLET (20 MG) BY MOUTH ONCE DAILY IN THE MORNING, YOU MAY TAKE AN EXTRA 1 TABLET (20 MG) IN THE AFTERNOON FOR ABDOMINAL SWELLING 135 tablet 0   No current facility-administered medications for this visit.     Allergies:   Dapagliflozin, Statins, Tetracycline, and Tetracyclines & related   Social History:  The patient  reports that he quit smoking about 5 years ago. His smoking use included cigarettes. He has a 5.00 pack-year smoking history. He has never used smokeless tobacco. He reports current alcohol use of about 5.0 standard drinks per week. He reports that he does not use drugs.   Family History:   family history includes Asthma in his brother; Cancer in his mother and paternal grandfather; Cancer (age of onset: 29) in his brother; Diabetes in his paternal grandmother; Heart disease in his  father and sister; Hypertension in his father; Liver cancer in his mother; Liver disease in his brother; Multiple sclerosis in his sister; Suicidality in his brother; Thyroid disease in his brother; Valvular heart disease in his father.    Review of Systems: Review of Systems  Constitutional: Negative.   HENT: Negative.    Respiratory: Negative.    Cardiovascular: Negative.   Gastrointestinal: Negative.   Musculoskeletal: Negative.   Neurological: Negative.   Psychiatric/Behavioral: Negative.    All other systems reviewed and are negative.   PHYSICAL EXAM: VS:  BP 110/70 (BP Location: Left Arm, Patient Position: Sitting, Cuff Size: Normal)    Pulse (!) 46    Ht 5' 9.5" (1.765 m)    Wt 213 lb (96.6 kg)    SpO2 98%    BMI 31.00 kg/m  , BMI Body mass index is 31 kg/m. Constitutional:  oriented to person, place, and time. No distress.  HENT:  Head: Grossly normal Eyes:  no discharge. No scleral icterus.  Neck: No JVD, no carotid bruits  Cardiovascular: Regular rate and rhythm, no murmurs appreciated Pulmonary/Chest: Clear to auscultation bilaterally, no wheezes or rails Abdominal: Soft.  no distension.  no tenderness.  Musculoskeletal: Normal range of motion Neurological:  normal muscle tone. Coordination normal. No atrophy Skin: Skin warm and dry Psychiatric: normal affect, pleasant  Recent Labs: 07/27/2020: Magnesium 1.9 09/15/2020: ALT 19; BUN 21; Creatinine, Ser 1.11; Platelets 230; Potassium 3.8; Sodium 137; TSH 4.817 12/12/2020: Hemoglobin 14.5    Lipid Panel Lab Results  Component Value Date   CHOL 113 09/15/2020   HDL 33 (L) 09/15/2020   LDLCALC 67 09/15/2020   TRIG 63 09/15/2020      Wt Readings from Last 3 Encounters:  03/25/21 213 lb (96.6 kg)  12/24/20 211 lb (95.7 kg)  11/24/20 208 lb 11.2 oz (94.7 kg)      ASSESSMENT AND PLAN:  Problem List Items Addressed This Visit       Cardiology Problems   Coronary artery disease - Primary   Essential  hypertension     Other   S/P CABG (coronary artery bypass graft)   Controlled type 2 diabetes mellitus with complication, without long-term current use of insulin (HCC)   Statin-induced myositis   Dyslipidemia  CAD with stable angina catheterization for non-STEMI Grafts patent, severe underlying three-vessel disease Not on beta-blocker secondary to prior history of bradycardia Currently with no symptoms of angina. No further workup at this time. Continue current medication regimen.  Diabetes type 2 A1c >7 Goal to 6 lifestyle modification recommended  Hyperlipidemia  Cholesterol is at goal on the current lipid regimen. No changes to the medications were made.  HTN: Blood pressure is well controlled on today's visit. No changes made to the medications. BP low today 504 systolic, some orthostasis  Cardiomyopathy Discussed current medications, prefers no changes at this time Discusses spironolactone and others   Total encounter time more than 25 minutes  Greater than 50% was spent in counseling and coordination of care with the patient    Signed, Esmond Plants, M.D., Ph.D. [place], [place]

## 2021-03-25 NOTE — Patient Instructions (Addendum)
Medication Instructions:  No changes   If you need a refill on your cardiac medications before your next appointment, please call your pharmacy.   Lab work: - Your physician recommends that you have lab work today: HgBA1c today  Testing/Procedures: No new testing needed  Follow-Up: At [facility], you and your health needs are our priority.  As part of our continuing mission to provide you with exceptional heart care, we have created designated Provider Care Teams.  These Care Teams include your primary Cardiologist (physician) and Advanced Practice Providers (APPs -  Physician Assistants and Nurse Practitioners) who all work together to provide you with the care you need, when you need it.  You will need a follow up appointment in 6 months  Providers on your designated Care Team:   Murray Hodgkins, NP Christell Faith, PA-C Cadence Kathlen Mody, [place]  COVID-19 Vaccine Information can be found at: [email or web address] For questions related to vaccine distribution or appointments, please email [email or web address] or call [phone].

## 2021-03-26 LAB — HEMOGLOBIN A1C
Est. average glucose Bld gHb Est-mCnc: 154 mg/dL
Hgb A1c MFr Bld: 7 % — ABNORMAL HIGH (ref 4.8–5.6)

## 2021-04-17 ENCOUNTER — Other Ambulatory Visit: Payer: Self-pay | Admitting: Medical

## 2021-04-17 DIAGNOSIS — F430 Acute stress reaction: Secondary | ICD-10-CM

## 2021-05-18 ENCOUNTER — Other Ambulatory Visit: Payer: Self-pay | Admitting: Cardiology

## 2021-06-16 ENCOUNTER — Other Ambulatory Visit: Payer: Self-pay | Admitting: Cardiovascular Disease

## 2021-06-16 ENCOUNTER — Other Ambulatory Visit: Payer: Self-pay | Admitting: Medical

## 2021-07-03 ENCOUNTER — Other Ambulatory Visit: Payer: Self-pay | Admitting: Medical

## 2021-08-17 ENCOUNTER — Other Ambulatory Visit: Payer: Self-pay | Admitting: *Deleted

## 2021-08-17 MED ORDER — CLOPIDOGREL BISULFATE 75 MG PO TABS: 75.0000 mg | ORAL_TABLET | Freq: Every day | ORAL | 0 refills | Status: DC

## 2021-08-17 MED ORDER — EZETIMIBE 10 MG PO TABS: 10.0000 mg | ORAL_TABLET | Freq: Every day | ORAL | 0 refills | Status: DC

## 2021-08-17 MED ORDER — ATORVASTATIN CALCIUM 10 MG PO TABS: 10.0000 mg | ORAL_TABLET | Freq: Every day | ORAL | 0 refills | Status: DC

## 2021-09-05 ENCOUNTER — Other Ambulatory Visit: Payer: Self-pay | Admitting: Cardiovascular Disease

## 2021-09-07 ENCOUNTER — Other Ambulatory Visit: Payer: Self-pay

## 2021-09-07 MED ORDER — ENTRESTO 24-26 MG PO TABS: 1.0000 | ORAL_TABLET | Freq: Two times a day (BID) | ORAL | 3 refills | Status: DC

## 2021-09-07 NOTE — Telephone Encounter (Signed)
Good morning, could you please schedule a 6 month follow up  appointment? The patient was last seen by Dr. Rockey Situ 03/15/21. Thank you so much.

## 2021-09-07 NOTE — Telephone Encounter (Signed)
Scheduled

## 2021-09-08 ENCOUNTER — Other Ambulatory Visit: Payer: Self-pay | Admitting: Cardiovascular Disease

## 2021-09-16 ENCOUNTER — Other Ambulatory Visit: Payer: Self-pay | Admitting: Cardiovascular Disease

## 2021-10-01 ENCOUNTER — Other Ambulatory Visit: Payer: Self-pay | Admitting: Cardiovascular Disease

## 2021-10-05 ENCOUNTER — Other Ambulatory Visit: Payer: Self-pay | Admitting: Cardiovascular Disease

## 2021-10-20 ENCOUNTER — Other Ambulatory Visit: Payer: Self-pay | Admitting: Cardiovascular Disease

## 2021-11-01 ENCOUNTER — Other Ambulatory Visit: Payer: Self-pay | Admitting: Cardiovascular Disease

## 2021-12-03 ENCOUNTER — Other Ambulatory Visit: Payer: Self-pay | Admitting: Cardiovascular Disease

## 2021-12-07 NOTE — Progress Notes (Signed)
Cardiology Office Note  Date:  12/07/2021   ID:  Todd Kelley, DOB 1966/01/01, MRN [id number]  PCP:  System, Provider Not In   No chief complaint on file.   HPI:  [name] is a 56 year old gentleman with  Coronary artery disease  lateral MI in 2000,  CABG x4 December 2002,  poor cardiology follow-up, medication noncompliance,  obesity,  hospital May 2022 with unstable angina/non-STEMI Ejection fraction 35% to 40% in 10/22 Not on beta-blocker secondary to junctional bradycardia Who presents to the office for follow-up of his coronary disease  Last seen in clinic January 2023  Seen by EP December 24, 2020 MRI recently showed an ejection fraction of 39% with 9% LGE burden. 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  Severe underlying three-vessel coronary artery disease with patent grafts including LIMA to LAD, RIMA to ramus and SVG to right PDA.  There is significant mid to distal LAD stenosis proximal to the LIMA anastomosis, significant ostial left circumflex disease but the left circumflex distribution is relatively small.  There is moderate proximal disease affecting the SVG to right PDA. 2.  Moderately reduced LV systolic function with an EF of 35 to 40% with global hypokinesis. 3.  Normal left ventricular end-diastolic pressure.    Recommendations: No clear culprit is identified for the patient's non-ST elevation myocardial infarction.  Vessel occlusion with spontaneous recanalization is a possibility especially in the SVG to right PDA.  It was recommended Plavix for at least 1 year  Echocardiogram 10/28/2020  1. Left ventricular ejection fraction, by estimation, is 35 to 40%. The  left ventricle has moderately decreased function. The left ventricle  demonstrates regional wall motion abnormalities (Hypokinesis of the  anterior and anteroseptal wall). There is  moderate left ventricular hypertrophy. Left ventricular diastolic  parameters are consistent with Grade I diastolic dysfunction (impaired  relaxation).   2. Right ventricular systolic function is normal. The right ventricular  size is normal.   3. Left atrial size was mildly dilated.   4. Frequent PVCs   Total cholesterol arrival to the hospital 207 on Jul 26, 2020 Repeat total cholesterol September 15, 2020 down to 113 A1C 7.3  ZIO monitor Normal sinus rhythm 12 Ventricular Tachycardia runs occurred, the run with the fastest interval lasting 17 beats with a max rate of 176 bpm (avg 154 bpm); the run with the fastest interval was also the longest.  Idioventricular Rhythm was present. Junctional Rhythm was present.  Isolated SVEs were rare (<1.0%, 883), SVE Couplets were rare (<1.0%, 44), and no SVE Triplets were present. Isolated VEs were occasional (4.9%, [id number]), VE Couplets were occasional (1.5%, 6109), and VE Triplets were rare (<1.0%, 2221). Ventricular Bigeminy and Trigeminy were present.  No BB given h/o junctional bradycardia.    PMH:   has a past medical history of Acid reflux, Allergic  asthma, Coronary artery disease, DVT (deep venous thrombosis) ([place]), Erectile dysfunction, Hyperlipidemia LDL goal <70, Hypertension, Hypothyroidism, Palpitations, Pericardial effusion, Tobacco use, and Type II diabetes mellitus ([place]).  PSH:    Past Surgical  History:  Procedure Laterality Date   APPENDECTOMY  2001   CARDIAC CATHETERIZATION  2000   stent   COLONOSCOPY WITH PROPOFOL N/A 03/17/2018   Procedure: COLONOSCOPY WITH PROPOFOL;  Surgeon: Lucilla Lame, MD;  Location: [place];  Service: Endoscopy;  Laterality: N/A;   CORONARY ANGIOPLASTY  jan. 2001   CORONARY ANGIOPLASTY WITH STENT PLACEMENT  june 2001   stent   CORONARY ARTERY BYPASS GRAFT  dec. 2002   LIMA graft to LAD,RIMA graft to the intermediate branch and saphenous vein graft  to the posterior lateral and posterior descending branches of the right coronary   LEFT HEART CATH AND CORS/GRAFTS ANGIOGRAPHY N/A 07/28/2020   Procedure: LEFT HEART CATH AND CORS/GRAFTS ANGIOGRAPHY;  Surgeon: Wellington Hampshire, MD;  Location: [place] [facility];  Service: Cardiovascular;  Laterality: N/A;   POLYPECTOMY  03/17/2018   Procedure: POLYPECTOMY;  Surgeon: Lucilla Lame, MD;  Location: [facility];  Service: Endoscopy;;    Current Outpatient Medications  Medication Sig Dispense Refill   aspirin (ASPIRIN LOW DOSE) 81 MG EC tablet Take 1 tablet (81 mg total) by mouth daily. Swallow whole. 90 tablet 3   atorvastatin (LIPITOR) 10 MG tablet TAKE 1 TABLET BY MOUTH EVERY DAY 30 tablet 0   cetirizine (ZYRTEC) 10 MG tablet Take 10 mg by mouth daily.     clopidogrel (PLAVIX) 75 MG tablet Take 1 tablet (75 mg total) by mouth daily with breakfast. PATIENT MUST KEEP UPCOMING APPOINTMENT FOR FUTURE REFILLS 90 tablet 0   Continuous Blood Gluc Receiver (DEXCOM G6 RECEIVER) DEVI Use as instructed 1 each 0   Continuous Blood Gluc Sensor (DEXCOM G6 SENSOR) MISC Use for every 14 days. 3 each 2   Continuous Blood Gluc Transmit (DEXCOM G6 TRANSMITTER) MISC USE AS INSTRUCTED 1 each 0   ENTRESTO 24-26 MG TAKE 1 TABLET BY MOUTH TWICE A DAY 60 tablet 3   escitalopram (LEXAPRO) 10 MG tablet Take 1 tablet (10 mg total) by mouth daily. 90 tablet 3   ezetimibe (ZETIA) 10 MG tablet TAKE 1 TABLET BY MOUTH  EVERY DAY 30 tablet 0   fenofibrate (TRICOR) 145 MG tablet TAKE 1 TABLET BY MOUTH EVERY DAY 90 tablet 1   JARDIANCE 10 MG TABS tablet TAKE 1 TABLET BY MOUTH EVERY DAY BEFORE BREAKFAST 30 tablet 1   levothyroxine (SYNTHROID) 150 MCG tablet TAKE 1 TABLET BY MOUTH DAILY BEFORE BREAKFAST. 90 tablet 1   metFORMIN (GLUCOPHAGE XR) 500 MG 24 hr tablet Take 1 tablet (500 mg total) by mouth daily with breakfast. 30 tablet 11   Multiple Vitamins-Minerals (MULTIVITAMIN ADULT) CHEW Chew 1 tablet by mouth daily.     nitroGLYCERIN (NITROSTAT) 0.4 MG SL tablet Place 1 tablet (0.4 mg total) under the tongue every 5 (five) minutes as needed for chest pain. 25 tablet 4   potassium chloride (KLOR-CON) 10 MEQ tablet TAKE 1 TABLET BY MOUTH EVERY DAY 90 tablet 0   PROAIR RESPICLICK 010 (90 Base) MCG/ACT AEPB USE 2 INHALATIONS BY MOUTH  EVERY 6 HOURS AS NEEDED 1 each 0   tadalafil (CIALIS) 5 MG tablet TAKE 1 TABLET DAILY 90 tablet 0   torsemide (DEMADEX) 20 MG tablet TAKE 1 DAILY IN THE MORNING, YOU MAY TAKE AN EXTRA 1 TABLET IN THE AFTERNOON FOR  ABDOMINAL SWELLING 180 tablet 0   No current facility-administered medications for this visit.     Allergies:   Dapagliflozin, Statins, Tetracycline, and Tetracyclines & related   Social History:  The patient  reports that he quit smoking about 6 years ago. His smoking use included cigarettes. He has a 5.00 pack-year smoking history. He has never used smokeless tobacco. He reports current alcohol use of about 5.0 standard drinks of alcohol per week. He reports that he does not use drugs.   Family History:   family history includes Asthma in his brother; Cancer in his mother and paternal grandfather; Cancer (age of onset: 64) in his brother; Diabetes in his paternal grandmother; Heart disease in his father and sister; Hypertension in his father; Liver cancer in his mother; Liver disease in his brother; Multiple sclerosis in his sister; Suicidality in his brother; Thyroid disease  in his brother; Valvular heart disease in his father.    Review of Systems: Review of Systems  Constitutional: Negative.   HENT: Negative.    Respiratory: Negative.    Cardiovascular: Negative.   Gastrointestinal: Negative.   Musculoskeletal: Negative.   Neurological: Negative.   Psychiatric/Behavioral: Negative.    All other systems reviewed and are negative.    PHYSICAL EXAM: VS:  There were no vitals taken for this visit. , BMI There is no height or weight on file to calculate BMI. Constitutional:  oriented to person, place, and time. No distress.  HENT:  Head: Grossly normal Eyes:  no discharge. No scleral icterus.  Neck: No JVD, no carotid bruits  Cardiovascular: Regular rate and rhythm, no murmurs appreciated Pulmonary/Chest: Clear to auscultation bilaterally, no wheezes or rails Abdominal: Soft.  no distension.  no tenderness.  Musculoskeletal: Normal range of motion Neurological:  normal muscle tone. Coordination normal. No atrophy Skin: Skin warm and dry Psychiatric: normal affect, pleasant  Recent Labs: 12/12/2020: Hemoglobin 14.5    Lipid Panel Lab Results  Component Value Date   CHOL 113 09/15/2020   HDL 33 (L) 09/15/2020   LDLCALC 67 09/15/2020   TRIG 63 09/15/2020      Wt Readings from Last 3 Encounters:  03/25/21 213 lb (96.6 kg)  12/24/20 211 lb (95.7 kg)  11/24/20 208 lb 11.2 oz (94.7 kg)      ASSESSMENT AND PLAN:  Problem List Items Addressed This Visit   None CAD with stable angina catheterization for non-STEMI Grafts patent, severe underlying three-vessel disease Not on beta-blocker secondary to prior history of bradycardia Currently with no symptoms of angina. No further workup at this time. Continue current medication regimen.  Diabetes type 2 A1c >7 Goal to 6 lifestyle modification recommended  Hyperlipidemia Cholesterol is at goal on the current lipid regimen. No changes to the medications were made.  HTN: Blood pressure  is well controlled on today's visit. No changes made to the medications. BP low today 161 systolic, some orthostasis  Cardiomyopathy Discussed current medications, prefers no changes at this time Discusses spironolactone and others   Total encounter time more than 25 minutes  Greater than 50% was spent in counseling and coordination of care with the patient    Signed, Esmond Plants, M.D., Ph.D. [place], [place]

## 2021-12-08 ENCOUNTER — Ambulatory Visit
Payer: No Typology Code available for payment source | Attending: Cardiovascular Disease | Admitting: Cardiovascular Disease

## 2021-12-08 ENCOUNTER — Encounter: Payer: Self-pay | Admitting: Cardiovascular Disease

## 2021-12-08 VITALS — BP 120/72 | HR 56 | Ht 70.0 in | Wt 214.8 lb

## 2021-12-08 DIAGNOSIS — Z951 Presence of aortocoronary bypass graft: Secondary | ICD-10-CM | POA: Diagnosis not present

## 2021-12-08 DIAGNOSIS — I5042 Chronic combined systolic (congestive) and diastolic (congestive) heart failure: Secondary | ICD-10-CM

## 2021-12-08 DIAGNOSIS — T466X5D Adverse effect of antihyperlipidemic and antiarteriosclerotic drugs, subsequent encounter: Secondary | ICD-10-CM

## 2021-12-08 DIAGNOSIS — I10 Essential (primary) hypertension: Secondary | ICD-10-CM

## 2021-12-08 DIAGNOSIS — I25118 Atherosclerotic heart disease of native coronary artery with other forms of angina pectoris: Secondary | ICD-10-CM

## 2021-12-08 DIAGNOSIS — E785 Hyperlipidemia, unspecified: Secondary | ICD-10-CM

## 2021-12-08 DIAGNOSIS — M609 Myositis, unspecified: Secondary | ICD-10-CM

## 2021-12-08 DIAGNOSIS — E118 Type 2 diabetes mellitus with unspecified complications: Secondary | ICD-10-CM

## 2021-12-08 DIAGNOSIS — I255 Ischemic cardiomyopathy: Secondary | ICD-10-CM

## 2021-12-08 MED ORDER — ATORVASTATIN CALCIUM 20 MG PO TABS: 10.0000 mg | ORAL_TABLET | Freq: Every day | ORAL | 3 refills | Status: AC

## 2021-12-08 NOTE — Patient Instructions (Addendum)
Medication Instructions:  Please stop plavix when you run out Please increase the Lipitor up to 20 mg daily Ok to hold torsemide 1-2 days a week for high renal function  If you need a refill on your cardiac medications before your next appointment, please call your pharmacy.   Lab work: No new labs needed  Testing/Procedures: No new testing needed  Follow-Up: At [facility], you and your health needs are our priority.  As part of our continuing mission to provide you with exceptional heart care, we have created designated Provider Care Teams.  These Care Teams include your primary Cardiologist (physician) and Advanced Practice Providers (APPs -  Physician Assistants and Nurse Practitioners) who all work together to provide you with the care you need, when you need it.  You will need a follow up appointment in 12 months  Providers on your designated Care Team:   Murray Hodgkins, NP Christell Faith, PA-C Cadence Kathlen Mody, [place]  COVID-19 Vaccine Information can be found at: [email or web address] For questions related to vaccine distribution or appointments, please email [email or web address] or call [phone].    Please call us if you have worsening chest pain.

## 2022-02-24 ENCOUNTER — Other Ambulatory Visit: Payer: Self-pay | Admitting: Cardiology

## 2022-02-24 DIAGNOSIS — I5043 Acute on chronic combined systolic (congestive) and diastolic (congestive) heart failure: Secondary | ICD-10-CM

## 2022-02-24 DIAGNOSIS — I255 Ischemic cardiomyopathy: Secondary | ICD-10-CM

## 2022-02-26 ENCOUNTER — Ambulatory Visit: Payer: No Typology Code available for payment source | Attending: Cardiology

## 2022-02-26 DIAGNOSIS — I255 Ischemic cardiomyopathy: Secondary | ICD-10-CM

## 2022-02-26 DIAGNOSIS — I5043 Acute on chronic combined systolic (congestive) and diastolic (congestive) heart failure: Secondary | ICD-10-CM

## 2022-02-26 LAB — ECHOCARDIOGRAM COMPLETE
AR max vel: 3.32 cm2
AV Area VTI: 3.32 cm2
AV Area mean vel: 3.34 cm2
AV Mean grad: 4 mmHg
AV Peak grad: 8.4 mmHg
Ao pk vel: 1.45 m/s
Area-P 1/2: 3.65 cm2
Calc EF: 49.1 %
S' Lateral: 3.9 cm
Single Plane A2C EF: 48.1 %
Single Plane A4C EF: 49.9 %

## 2022-03-12 LAB — HM DIABETES EYE EXAM

## 2022-03-18 ENCOUNTER — Encounter: Payer: Self-pay | Admitting: Internal Medicine

## 2022-04-03 ENCOUNTER — Other Ambulatory Visit: Payer: Self-pay | Admitting: Cardiovascular Disease

## 2022-06-24 IMAGING — DX DG CHEST 1V PORT
1 series · 1 of 1 positions shown · non-contrast
Comparison: Radiograph 06/19/2005

CLINICAL DATA: Chest pain on exertion

EXAM:
PORTABLE CHEST 1 VIEW

[chest ap]
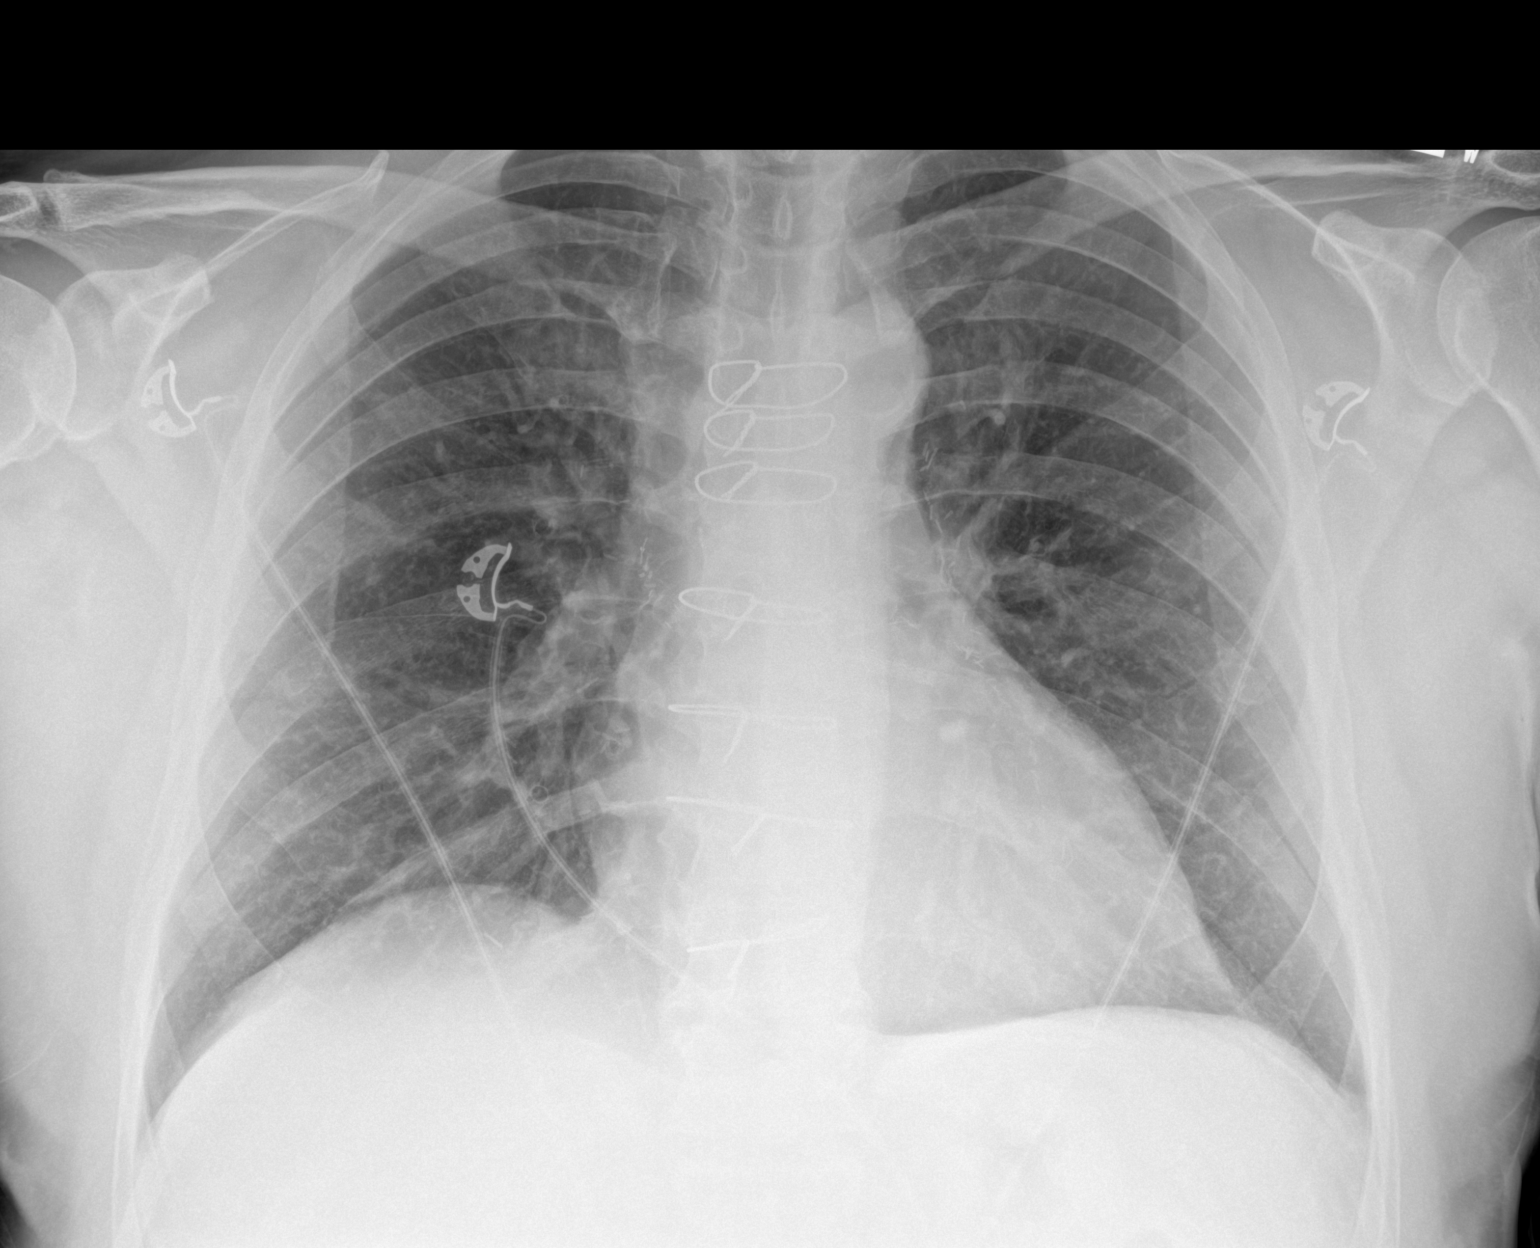

[1 of 1 positions shown; findings below may reference images not displayed]

FINDINGS: Postsurgical changes related to prior CABG including intact and
aligned sternotomy wires and multiple surgical clips projecting over
the mediastinum. The aorta is calcified. The remaining
cardiomediastinal contours are unremarkable. No consolidation,
features of edema, pneumothorax, or effusion. Pulmonary vascularity
is normally distributed. No acute osseous or soft tissue
abnormality. Telemetry leads overlie the chest.
IMPRESSION: No acute cardiopulmonary abnormality.

Prior sternotomy and CABG.

## 2022-07-09 NOTE — Progress Notes (Unsigned)
Cardiology Office Note Date:  07/12/2022  Patient ID:  Todd Kelley, Todd Kelley 10-04-1965, MRN [id number] PCP:  Enid Baas, MD  Cardiologist:  Julien Nordmann, MD Electrophysiologist: Lanier Prude, MD    Chief Complaint: 1 year follow-up, past due  History of Present Illness: Todd Kelley is a 57 y.o. male with PMH notable for CAD s/p CABG, HFimpEF; seen today for Lanier Prude, MD for routine electrophysiology followup.  Last saw Dr. Lalla Brothers 11/2020 after a cardiac MRI to further assess ejection fraction and for evidence of LGE.  The patient had a 9% LGE burden.  At that visit, Dr. Lalla Brothers recommended an EP study with ventricular stimulation protocol.  The patient did not want to proceed with ICD placement and so it was recommended to obtain yearly echoes to monitor EF.  He had an updated echo 01/2022 that showed significant improvement in EF up to 50 to 55%.  Today, he tells me that he is overall doing well. Continues to not want to proceed with ICD implant. He takes torsemide daily, sometimes needing additional PM dose but not regularly. If he has extra fluid, tends to be in abd - feels bloated. Fluid is well-controlled with this regimen. Weight is stable on home scale, weighs 2/week.  He has good appetite, no bloating currently. Able to sleep flat.  He admits to some recent dietary indescrition that has increased his weight. Was helping to care for father during recent illness and found himself eating more often than normal.    Past Medical History:  Diagnosis Date   Acid reflux    Allergic asthma    rare flare up, worse with alllergies as of 12/2016   Coronary artery disease    a. 08/1998 Lateral MI/PCI/BMS [place]; b. 2001 PTCA x 2 2/2 ISR in [place]; c. 2002 PCI/brachyrx 2/2 ISR in [place]; d. 01/2001 CABG x 4: LIMA->LAD, RIMA->RI, VG->RPL->RPDA; e. 01/2014 MV: Low risk w/ attenuation artifact and mild inf basal isch. Nl EF->Med rx.   DVT (deep venous thrombosis) (H[facility])    a. 01/2001  after CABG.   Erectile dysfunction    Hyperlipidemia LDL goal <70    Hypertension    Hypothyroidism    Palpitations    Pericardial effusion    a. 01/2001 Effusion w/ tamponade req window following CABG.   Tobacco use    vape, no cigarrette since 04/2015   Type II diabetes mellitus (H[facility])     Past Surgical History:  Procedure Laterality Date   APPENDECTOMY  2001   CARDIAC CATHETERIZATION  2000   stent   COLONOSCOPY WITH PROPOFOL N/A 03/17/2018   Procedure: COLONOSCOPY WITH PROPOFOL;  Surgeon: Midge Minium, MD;  [facility];  Service: Endoscopy;  Laterality: N/A;   CORONARY ANGIOPLASTY  jan. 2001   CORONARY ANGIOPLASTY WITH STENT PLACEMENT  june 2001   stent   CORONARY ARTERY BYPASS GRAFT  dec. 2002   LIMA graft to LAD,RIMA graft to the intermediate branch and saphenous vein graft  to the posterior lateral and posterior descending branches of the right coronary   LEFT HEART CATH AND CORS/GRAFTS ANGIOGRAPHY N/A 07/28/2020   Procedure: LEFT HEART CATH AND CORS/GRAFTS ANGIOGRAPHY;  Surgeon: Iran Ouch, MD;  Location: [facility];  Service: Cardiovascular;  Laterality: N/A;   POLYPECTOMY  03/17/2018   Procedure: POLYPECTOMY;  Surgeon: Midge Minium, MD;  Location: [facility];  Service: Endoscopy;;    Current Outpatient Medications  Medication Instructions   aspirin EC (  ASPIRIN LOW DOSE) 81 mg, Oral, Daily, Swallow whole.   atorvastatin (LIPITOR) 10 mg, Oral, Daily   cetirizine (ZYRTEC) 10 mg, Oral, Daily   clopidogrel (PLAVIX) 75 mg, Oral, Daily with breakfast, PATIENT MUST KEEP UPCOMING APPOINTMENT FOR FUTURE REFILLS   ENTRESTO 24-26 MG 1 tablet, Oral, 2 times daily   escitalopram (LEXAPRO) 10 mg, Oral, Daily   ezetimibe (ZETIA) 10 mg, Oral, Daily   fenofibrate (TRICOR) 145 MG tablet TAKE 1 TABLET BY MOUTH EVERY DAY   JARDIANCE 10 MG TABS tablet TAKE 1 TABLET BY MOUTH EVERY DAY BEFORE BREAKFAST   levothyroxine (SYNTHROID) 150 MCG tablet TAKE 1  TABLET BY MOUTH DAILY BEFORE BREAKFAST.   metFORMIN (GLUCOPHAGE XR) 500 mg, Oral, Daily with breakfast   Multiple Vitamins-Minerals (MULTIVITAMIN ADULT) CHEW 1 tablet, Oral, Daily   nitroGLYCERIN (NITROSTAT) 0.4 mg, Sublingual, Every 5 min PRN   potassium chloride (KLOR-CON) 10 MEQ tablet TAKE 1 TABLET BY MOUTH EVERY DAY   PROAIR RESPICLICK 108 (90 Base) MCG/ACT AEPB USE 2 INHALATIONS BY MOUTH  EVERY 6 HOURS AS NEEDED   tadalafil (CIALIS) 5 MG tablet TAKE 1 TABLET DAILY   torsemide (DEMADEX) 20 MG tablet TAKE 1 DAILY IN THE MORNING, YOU MAY TAKE AN EXTRA 1 TABLET IN THE AFTERNOON FOR ABDOMINAL SWELLING    Social History:  The patient  reports that he quit smoking about 7 years ago. His smoking use included cigarettes. He has a 5.00 pack-year smoking history. He has never used smokeless tobacco. He reports current alcohol use of about 5.0 standard drinks of alcohol per week. He reports that he does not use drugs.   Family History:  The patient's family history includes Asthma in his brother; Cancer in his mother and paternal grandfather; Cancer (age of onset: 55) in his brother; Diabetes in his paternal grandmother; Heart disease in his father and sister; Hypertension in his father; Liver cancer in his mother; Liver disease in his brother; Multiple sclerosis in his sister; Suicidality in his brother; Thyroid disease in his brother; Valvular heart disease in his father.  ROS:  Please see the history of present illness. All other systems are reviewed and otherwise negative.   PHYSICAL EXAM:  VS:  BP 120/78 (BP Location: Left Arm, Patient Position: Sitting, Cuff Size: Large)   Pulse 64   Ht 5' 9.5" (1.765 m)   Wt 218 lb (98.9 kg)   SpO2 98%   BMI 31.73 kg/m  BMI: Body mass index is 31.73 kg/m.  GEN- The patient is well appearing, alert and oriented x 3 today.   Lungs- Clear to ausculation bilaterally, normal work of breathing.  Heart- Regular rate and rhythm, no murmurs, rubs or  gallops Extremities- Trace peripheral edema, warm, dry  EKG is ordered. Personal review of EKG from today shows: Sinus arrhythmia w PVC, rate 64bpm; LAD, PRWP  Recent Labs: No results found for requested labs within last 365 days.  No results found for requested labs within last 365 days.   CrCl cannot be calculated (Patient's most recent lab result is older than the maximum 21 days allowed.).   Wt Readings from Last 3 Encounters:  07/12/22 218 lb (98.9 kg)  12/08/21 214 lb 12.8 oz (97.4 kg)  03/25/21 213 lb (96.6 kg)     Additional studies reviewed include: Previous EP, cardiology notes.   TTE, 02/26/2022  1. Left ventricular ejection fraction, by estimation, is 50 to 55%. The left ventricle has low normal function. The left ventricle has no regional wall  motion abnormalities. The left ventricular internal cavity size was mildly dilated. There is mild left ventricular hypertrophy. Left ventricular diastolic parameters were normal. The average left ventricular global longitudinal strain is -16.9 %. The global longitudinal strain is normal.   2. Right ventricular systolic function is normal. The right ventricular size is normal. Tricuspid regurgitation signal is inadequate for assessing PA pressure.   3. Left atrial size was mildly dilated.   4. The mitral valve is normal in structure. No evidence of mitral valve regurgitation. No evidence of mitral stenosis.   5. The aortic valve is normal in structure. Aortic valve regurgitation is not visualized. No aortic stenosis is present.  Comparison(s): A prior study was performed on 12/02/2020. Changes from prior study are noted. The left ventricular function has improved.   cMRI, 12/18/2020 1.  Moderately reduced LV systolic function, LVEF 39 %.  2. Delayed enhancement (subendocardial scar) in the LV basal to mid anterolateral wall (50-75% wall thickness) and mid to apical inferoseptal wall (25% wall thickness). 3.  Percentage scar/fibrosis  to total myocardium is 9%. 4. Anterolateral wall does not appear viable, low chance for recovery. 5.  Inferoseptal wall appears viable. 6.  Findings consistent with ischemic cardiomyopathy.  TTE, 12/02/2020 1. Left ventricular ejection fraction, by estimation, is 35 to 40%. The left ventricle has moderately decreased function. The left ventricle demonstrates global hypokinesis. Unable to exclude regional wall motion abnormality The left ventricular internal cavity size was mildly dilated. There is mild left ventricular hypertrophy. Left ventricular diastolic parameters are consistent with Grade I diastolic dysfunction (impaired relaxation).   2. Right ventricular systolic function is normal. The right ventricular size is normal.   3. Left atrial size was moderately dilated.   4. The mitral valve is normal in structure. Mild mitral valve regurgitation. No evidence of mitral stenosis.   5. The inferior vena cava is normal in size with greater than 50% respiratory variability, suggesting right atrial pressure of 3 mmHg.   6. Challenging images    ASSESSMENT AND PLAN:  #) HFimpEF NYHA class II symptoms Warm and dry on exam Last echo showed improved EF to 50-55%. GDMT: entresto 24-26; jardiance Diuretic: 20mg  daily + PRN Cont to monitor with yearly echo's   #) CAD s/p CABG No ischemic s/s currently   Current medicines are reviewed at length with the patient today.   The patient does not have concerns regarding his medicines.  The following changes were made today:  none  Labs/ tests ordered today include:  Orders Placed This Encounter  Procedures   EKG 12-Lead   ECHOCARDIOGRAM COMPLETE     Disposition: Follow up with Dr. Lalla Brothers or EP APP  ~9 months with echo prior    Signed, Sherie Don, NP  07/12/22  3:34 PM  Electrophysiology [facility]

## 2022-07-12 ENCOUNTER — Encounter: Payer: Self-pay | Admitting: Cardiology

## 2022-07-12 ENCOUNTER — Ambulatory Visit: Payer: No Typology Code available for payment source | Attending: Cardiology | Admitting: Cardiology

## 2022-07-12 VITALS — BP 120/78 | HR 64 | Ht 69.5 in | Wt 218.0 lb

## 2022-07-12 DIAGNOSIS — I25118 Atherosclerotic heart disease of native coronary artery with other forms of angina pectoris: Secondary | ICD-10-CM | POA: Diagnosis not present

## 2022-07-12 DIAGNOSIS — I10 Essential (primary) hypertension: Secondary | ICD-10-CM

## 2022-07-12 DIAGNOSIS — I255 Ischemic cardiomyopathy: Secondary | ICD-10-CM | POA: Diagnosis not present

## 2022-07-12 DIAGNOSIS — I5032 Chronic diastolic (congestive) heart failure: Secondary | ICD-10-CM | POA: Diagnosis not present

## 2022-07-12 NOTE — Patient Instructions (Signed)
Medication Instructions:  Your physician recommends that you continue on your current medications as directed. Please refer to the Current Medication list given to you today.  *If you need a refill on your cardiac medications before your next appointment, please call your pharmacy*   Lab Work: No labs ordered  If you have labs (blood work) drawn today and your tests are completely normal, you will receive your results only by: MyChart Message (if you have MyChart) OR A paper copy in the mail If you have any lab test that is abnormal or we need to change your treatment, we will call you to review the results.   Testing/Procedures: Your physician has requested that you have an echocardiogram in April 2025. Echocardiography is a painless test that uses sound waves to create images of your heart. It provides your doctor with information about the size and shape of your heart and how well your heart's chambers and valves are working. This procedure takes approximately one hour. There are no restrictions for this procedure. Please do NOT wear cologne, perfume, aftershave, or lotions (deodorant is allowed). Please arrive 15 minutes prior to your appointment time.  Follow-Up: At [facility], you and your health needs are our priority.  As part of our continuing mission to provide you with exceptional heart care, we have created designated Provider Care Teams.  These Care Teams include your primary Cardiologist (physician) and Advanced Practice Providers (APPs -  Physician Assistants and Nurse Practitioners) who all work together to provide you with the care you need, when you need it.  We recommend signing up for the patient portal called "MyChart".  Sign up information is provided on this After Visit Summary.  MyChart is used to connect with patients for Virtual Visits (Telemedicine).  Patients are able to view lab/test results, encounter notes, upcoming appointments, etc.  Non-urgent  messages can be sent to your provider as well.   To learn more about what you can do with MyChart, go to [email or web address].    Your next appointment:   1 year(s)  Provider:   Steffanie Dunn, MD or Sherie Don, NP

## 2022-09-18 ENCOUNTER — Other Ambulatory Visit: Payer: Self-pay | Admitting: Cardiovascular Disease

## 2022-11-30 ENCOUNTER — Emergency Department
Admission: EM | Admit: 2022-11-30 | Discharge: 2022-11-30 | Disposition: A | Discharge status: Home / Self Care | Payer: No Typology Code available for payment source | Attending: Emergency Medicine | Admitting: Emergency Medicine

## 2022-11-30 ENCOUNTER — Other Ambulatory Visit: Payer: Self-pay

## 2022-11-30 DIAGNOSIS — L0889 Other specified local infections of the skin and subcutaneous tissue: Secondary | ICD-10-CM | POA: Insufficient documentation

## 2022-11-30 DIAGNOSIS — S61452A Open bite of left hand, initial encounter: Secondary | ICD-10-CM | POA: Diagnosis not present

## 2022-11-30 DIAGNOSIS — S6992XA Unspecified injury of left wrist, hand and finger(s), initial encounter: Secondary | ICD-10-CM | POA: Diagnosis present

## 2022-11-30 DIAGNOSIS — L089 Local infection of the skin and subcutaneous tissue, unspecified: Secondary | ICD-10-CM

## 2022-11-30 DIAGNOSIS — W540XXA Bitten by dog, initial encounter: Secondary | ICD-10-CM | POA: Insufficient documentation

## 2022-11-30 LAB — COMPREHENSIVE METABOLIC PANEL
ALT: 21 U/L (ref 0–44)
AST: 20 U/L (ref 15–41)
Albumin: 4 g/dL (ref 3.5–5.0)
Alkaline Phosphatase: 39 U/L (ref 38–126)
Anion gap: 12 (ref 5–15)
BUN: 20 mg/dL (ref 6–20)
CO2: 22 mmol/L (ref 22–32)
Calcium: 9.4 mg/dL (ref 8.9–10.3)
Chloride: 103 mmol/L (ref 98–111)
Creatinine, Ser: 1.37 mg/dL — ABNORMAL HIGH (ref 0.61–1.24)
GFR, Estimated: 59 mL/min — ABNORMAL LOW (ref >60)
Glucose, Bld: 208 mg/dL — ABNORMAL HIGH (ref 70–99)
Potassium: 3.5 mmol/L (ref 3.5–5.1)
Sodium: 137 mmol/L (ref 135–145)
Total Bilirubin: 1.2 mg/dL (ref 0.3–1.2)
Total Protein: 7.2 g/dL (ref 6.5–8.1)

## 2022-11-30 LAB — CBC
HCT: 45.4 % (ref 39.0–52.0)
Hemoglobin: 14.6 g/dL (ref 13.0–17.0)
MCH: 28.2 pg (ref 26.0–34.0)
MCHC: 32.2 g/dL (ref 30.0–36.0)
MCV: 87.6 fL (ref 80.0–100.0)
Platelets: 225 10*3/uL (ref 150–400)
RBC: 5.18 MIL/uL (ref 4.22–5.81)
RDW: 12.8 % (ref 11.5–15.5)
WBC: 11.5 10*3/uL — ABNORMAL HIGH (ref 4.0–10.5)
nRBC: 0 % (ref 0.0–0.2)

## 2022-11-30 LAB — LACTIC ACID, PLASMA: Lactic Acid, Venous: 1 mmol/L (ref 0.5–1.9)

## 2022-11-30 MED ORDER — AMOXICILLIN-POT CLAVULANATE 875-125 MG PO TABS: 1.0000 | ORAL_TABLET | Freq: Two times a day (BID) | ORAL | 0 refills | Status: AC

## 2022-11-30 MED ORDER — AMOXICILLIN-POT CLAVULANATE 875-125 MG PO TABS
1.0000 | ORAL_TABLET | Freq: Once | ORAL | Status: AC
  Administered 2022-11-30: 1 via ORAL
  Filled 2022-11-30: qty 1

## 2022-11-30 MED ORDER — DOXYCYCLINE HYCLATE 100 MG PO TABS: 100.0000 mg | ORAL_TABLET | Freq: Two times a day (BID) | ORAL | 0 refills | Status: AC

## 2022-11-30 MED ORDER — DOXYCYCLINE HYCLATE 100 MG PO TABS
100.0000 mg | ORAL_TABLET | Freq: Once | ORAL | Status: AC
  Administered 2022-11-30: 100 mg via ORAL
  Filled 2022-11-30: qty 1

## 2022-11-30 NOTE — ED Notes (Signed)
Pt verbalizes understanding of discharge instructions. Opportunity for questioning and answers were provided. Pt discharged from ED to home by self.    

## 2022-11-30 NOTE — ED Triage Notes (Signed)
Pt here with an animal bite. Pt states his dog bit his left hand. Pt woke up today with swelling, pain, and slight streaking.

## 2022-11-30 NOTE — ED Provider Notes (Signed)
[facility] Provider Note    Event Date/Time   First MD Initiated Contact with Patient 11/30/22 1252     (approximate)   History   Animal Bite and Hand Pain   HPI  Todd Kelley is a 57 y.o. male with a prediabetes who reports he was bitten by his dog yesterday and has pain and swelling today.  Afebrile     Physical Exam   Triage Vital Signs: ED Triage Vitals  Encounter Vitals Group     BP 11/30/22 1213 112/68     Systolic BP Percentile --      Diastolic BP Percentile --      Pulse Rate 11/30/22 1213 96     Resp 11/30/22 1213 17     Temp 11/30/22 1213 97.9 F (36.6 C)     Temp Source 11/30/22 1213 Oral     SpO2 11/30/22 1213 94 %     Weight 11/30/22 1211 98.9 kg (218 lb 0.6 oz)     Height 11/30/22 1211 1.765 m (5' 9.5")     Head Circumference --      Peak Flow --      Pain Score 11/30/22 1211 5     Pain Loc --      Pain Education --      Exclude from Growth Chart --     Most recent vital signs: Vitals:   11/30/22 1213  BP: 112/68  Pulse: 96  Resp: 17  Temp: 97.9 F (36.6 C)  SpO2: 94%     General: Awake, no distress.  CV:  Good peripheral perfusion.  Resp:  Normal effort.  Abd:  No distention.  Other:  Left hand:, Mild swelling and redness with some streaking up the wrist, able to express purulent discharge as patient did have it Steri-Stripped closed   ED Results / Procedures / Treatments   Labs (all labs ordered are listed, but only abnormal results are displayed) Labs Reviewed  CBC - Abnormal; Notable for the following components:      Result Value   WBC 11.5 (*)    All other components within normal limits  COMPREHENSIVE METABOLIC PANEL - Abnormal; Notable for the following components:   Glucose, Bld 208 (*)    Creatinine, Ser 1.37 (*)    GFR, Estimated 59 (*)    All other components within normal limits  LACTIC ACID, PLASMA     EKG     RADIOLOGY     PROCEDURES:  Critical Care performed:    Procedures   MEDICATIONS ORDERED IN ED: Medications  amoxicillin-clavulanate (AUGMENTIN) 875-125 MG per tablet 1 tablet (1 tablet Oral Given 11/30/22 1306)  doxycycline (VIBRA-TABS) tablet 100 mg (100 mg Oral Given 11/30/22 1306)     IMPRESSION / MDM / ASSESSMENT AND PLAN / ED COURSE  I reviewed the triage vital signs and the nursing notes. Patient's presentation is most consistent with acute illness / injury with system symptoms.  Patient presents with signs of wound infection from dog bite.  Lab work is generally reassuring, mild elevation of white blood cell count however patient is afebrile.  Lactic acid is normal.  We will treat with Augmentin and doxycycline, discussed with patient strict return precautions for possible IV antibiotics if no improvement in the next 24 to 48 hours        FINAL CLINICAL IMPRESSION(S) / ED DIAGNOSES   Final diagnoses:  Dog bite, initial encounter  Wound infection     Rx /  DC Orders   ED Discharge Orders          Ordered    amoxicillin-clavulanate (AUGMENTIN) 875-125 MG tablet  2 times daily        11/30/22 1301    doxycycline (VIBRA-TABS) 100 MG tablet  2 times daily        11/30/22 1301             Note:  This document was prepared using Dragon voice recognition software and may include unintentional dictation errors.   Jene Every, MD 11/30/22 [phone]

## 2022-12-19 ENCOUNTER — Other Ambulatory Visit: Payer: Self-pay | Admitting: Cardiovascular Disease

## 2023-05-06 ENCOUNTER — Ambulatory Visit: Payer: No Typology Code available for payment source | Attending: Cardiology

## 2023-05-06 DIAGNOSIS — I255 Ischemic cardiomyopathy: Secondary | ICD-10-CM

## 2023-05-06 DIAGNOSIS — I5032 Chronic diastolic (congestive) heart failure: Secondary | ICD-10-CM | POA: Diagnosis not present

## 2023-05-06 LAB — ECHOCARDIOGRAM COMPLETE
AR max vel: 3.33 cm2
AV Area VTI: 2.99 cm2
AV Area mean vel: 2.93 cm2
AV Mean grad: 2 mmHg
AV Peak grad: 3.6 mmHg
Ao pk vel: 0.95 m/s
Area-P 1/2: 3.31 cm2
Calc EF: 43.1 %
S' Lateral: 3.6 cm
Single Plane A2C EF: 42.9 %
Single Plane A4C EF: 44.4 %

## 2023-10-10 ENCOUNTER — Encounter: Payer: Self-pay | Admitting: Medical

## 2023-10-10 ENCOUNTER — Ambulatory Visit: Attending: Medical | Admitting: Medical

## 2023-10-10 VITALS — BP 115/70 | HR 54 | Ht 69.0 in | Wt 216.2 lb

## 2023-10-10 DIAGNOSIS — I5032 Chronic diastolic (congestive) heart failure: Secondary | ICD-10-CM | POA: Diagnosis not present

## 2023-10-10 DIAGNOSIS — E118 Type 2 diabetes mellitus with unspecified complications: Secondary | ICD-10-CM | POA: Diagnosis not present

## 2023-10-10 DIAGNOSIS — I25118 Atherosclerotic heart disease of native coronary artery with other forms of angina pectoris: Secondary | ICD-10-CM

## 2023-10-10 DIAGNOSIS — I10 Essential (primary) hypertension: Secondary | ICD-10-CM

## 2023-10-10 DIAGNOSIS — Z951 Presence of aortocoronary bypass graft: Secondary | ICD-10-CM | POA: Diagnosis not present

## 2023-10-10 DIAGNOSIS — E782 Mixed hyperlipidemia: Secondary | ICD-10-CM

## 2023-10-10 NOTE — Progress Notes (Signed)
 Cardiology Office Note   Date:  10/10/2023  ID:  Todd Kelley, DOB 20-Apr-1965, MRN [id number] PCP: Sherial Bail, MD  [facility] Providers Cardiologist:  Evalene Lunger, MD Electrophysiologist:  OLE ONEIDA HOLTS, MD   History of Present Illness Todd Kelley is a 58 y.o. male with a pmh of CAD with lateral MI in 2000 and CABG x4 in 01/2001 with complicated post-op course including tamponade requiring window, DVT, hypertension, hyperlipidemia, diabetes type 2, tobacco and vape use, GERD, hypothyroidism, palpitations, ED,HFrEF,who presents for 1 year follow-up.   Most recent cardiac cath in 2022 showed severe underlying three-vessel CAD with patent grafts including LIMA to LAD, RIMA to ramus and SVG to right PDA, significant mid to distal LAD stenosis proximal to the LIMA anastomosis, significant ostial left circumflex disease but the left circumflex is attribution is relatively small, moderate proximal disease affecting SVG to right PDA, moderately reduced EF of 35 to 40%, global hypokinesis.  Echocardiogram in August 2022 showed EF of 35 to 40%, grade 1 diastolic dysfunction.  Heart monitor in 2022 showed normal sinus rhythm, 12 runs of nonsustained VT, junctional rhythm, idioventricular rhythm, ventricular ectopy of 4.9%.  Patient was subsequently referred to EP.  EP recommended ICD, however patient elected to obtain yearly echocardiograms.  Cardiac MRI in October 2022 showed a moderately reduced EF of 39%, delayed enhancement in the LV basal to mid anterior lateral wall and mid to apical for septal wall, 9% scar/fibrosis, findings consistent with ischemic cardiomyopathy.  Patient was placed on guideline directed medical therapy.  Echo in 2023 showed EF 50 to 55%, mild LVH.  Echo in 2025 showed EF of 50 to 55%, mild LVH.  Patient was last seen 07/12/2022 by EP.  Patient was stable from a cardiac perspective.  Today the patient is overall OK. Recent A1C 6.3. he was taking monjouro, but  had to stop due to abdominal pain. He denies chest pain, SOB. He has minimal lower leg edema that is dependent. No heart racing, palpitations, lightheadedness, dizziness, syncope. Patient walks for activity. He has minimal chest pain in the evenings. Says it's very tolerate. He takes torsemide as needed.   Studies Reviewed EKG Interpretation Date/Time:  Monday October 10 2023 15:19:28 EDT Ventricular Rate:  54 PR Interval:  192 QRS Duration:  98 QT Interval:  418 QTC Calculation: 396 R Axis:   -55  Text Interpretation: Sinus bradycardia Possible Left atrial enlargement Left axis deviation Nonspecific T wave abnormality When compared with ECG of 27-Jul-2020 08:43, Significant changes have occurred Confirmed by Franchester, Seydou Hearns ([id number]) on 10/10/2023 3:26:45 PM    Echo 04/2023  1. Left ventricular ejection fraction, by estimation, is 50 to 55%. The  left ventricle has low normal function. The left ventricle has no regional  wall motion abnormalities. There is mild left ventricular hypertrophy.  Left ventricular diastolic  parameters were normal.   2. Right ventricular systolic function is normal. The right ventricular  size is normal.   3. The mitral valve is normal in structure. No evidence of mitral valve  regurgitation.   4. The aortic valve is tricuspid. Aortic valve regurgitation is not  visualized.   5. The inferior vena cava is normal in size with greater than 50%  respiratory variability, suggesting right atrial pressure of 3 mmHg.   Echo 01/2022 1. Left ventricular ejection fraction, by estimation, is 50 to 55%. The  left ventricle has low normal function. The left ventricle has no regional  wall motion abnormalities. The  left ventricular internal cavity size was  mildly dilated. There is mild  left ventricular hypertrophy. Left ventricular diastolic parameters were  normal. The average left ventricular global longitudinal strain is -16.9  %. The global longitudinal strain is  normal.   2. Right ventricular systolic function is normal. The right ventricular  size is normal. Tricuspid regurgitation signal is inadequate for assessing  PA pressure.   3. Left atrial size was mildly dilated.   4. The mitral valve is normal in structure. No evidence of mitral valve  regurgitation. No evidence of mitral stenosis.   5. The aortic valve is normal in structure. Aortic valve regurgitation is  not visualized. No aortic stenosis is present.   cMRI 11/2020  IMPRESSION: 1.  Moderately reduced LV systolic function, LVEF 39 %.   2. Delayed enhancement (subendocardial scar) in the LV basal to mid anterolateral wall (50-75% wall thickness) and mid to apical inferoseptal wall (25% wall thickness).   3.  Percentage scar/fibrosis to total myocardium is 9%.   4. Anterolateral wall does not appear viable, low chance for recovery.   5.  Inferoseptal wall appears viable.   6.  Findings consistent with ischemic cardiomyopathy.      Echo 11/2020 1. Left ventricular ejection fraction, by estimation, is 35 to 40%. The  left ventricle has moderately decreased function. The left ventricle  demonstrates global hypokinesis. Unable to exclude regional wall motion  abnormality The left ventricular  internal cavity size was mildly dilated. There is mild left ventricular  hypertrophy. Left ventricular diastolic parameters are consistent with  Grade I diastolic dysfunction (impaired relaxation).   2. Right ventricular systolic function is normal. The right ventricular  size is normal.   3. Left atrial size was moderately dilated.   4. The mitral valve is normal in structure. Mild mitral valve  regurgitation. No evidence of mitral stenosis.   5. The inferior vena cava is normal in size with greater than 50%  respiratory variability, suggesting right atrial pressure of 3 mmHg.   6. Challenging images   Heart monitor 09/2020 Event Monitor  Patch Wear Time:  8 days and 22 hours  (2022-06-13T09:42:56-0400 to 2022-06-22T08:36:24-0400)   Patient had a min HR of 44 bpm, max HR of 176 bpm, and avg HR of 61 bpm.  Predominant underlying rhythm was Sinus Rhythm.    12 Ventricular Tachycardia runs occurred, the run with the fastest interval lasting 17 beats with a max rate of 176 bpm (avg 154  bpm); the run with the fastest interval was also the longest.Only the longest run was patient triggered, others were not triggered.   Idioventricular Rhythm was present.  Junctional Rhythm was present.  Ventricular Tachycardia, Idioventricular Rhythm and Junctional Rhythm were detected within +/- 45 seconds of symptomatic  patient event(s). predominantly the PVCs and junctional rhythm (rate in the 50s)   Isolated SVEs were rare (<1.0%, 883), SVE Couplets were rare (<1.0%, 44), and no SVE Triplets were present.  Isolated VEs were occasional (4.9%, [id number]), VE Couplets were occasional (1.5%, 6109), and VE Triplets were rare (<1.0%, 2221).  Ventricular Bigeminy and Trigeminy were present.    MPI 07/2020 There is a medium mild of moderate severity present in the basal anterolateral and mid inferoseptal location. Defect in the basal anterolateral location is partially reversible, while defect in the inferoseptal location appears fixed/artifactual. The left ventricular ejection fraction is moderately decreased (38%). This is an intermediate risk study. Findings consistent with prior myocardial infarction with mild peri-infarct ischemia in  the anterolateral position.  Echo 06/2020 1. Left ventricular ejection fraction, by estimation, is 35 to 40%. The  left ventricle has moderately decreased function. The left ventricle  demonstrates regional wall motion abnormalities (Hypokinesis of the  anterior and anteroseptal wall). There is  moderate left ventricular hypertrophy. Left ventricular diastolic  parameters are consistent with Grade I diastolic dysfunction (impaired  relaxation).   2.  Right ventricular systolic function is normal. The right ventricular  size is normal.   3. Left atrial size was mildly dilated.   4. Frequent PVCs    Heart cath 06/2020 Ramus lesion is 100% stenosed. Ost Cx to Prox Cx lesion is 85% stenosed. Prox LAD to Mid LAD lesion is 100% stenosed. 1st Diag lesion is 80% stenosed. There is moderate left ventricular systolic dysfunction. The left ventricular ejection fraction is 35-45% by visual estimate. LV end diastolic pressure is normal. Origin to Prox Graft lesion is 50% stenosed. RPDA lesion is 60% stenosed. RIMA graft was visualized by angiography and is normal in caliber. LIMA graft was visualized by angiography and is normal in caliber. The graft exhibits no disease. Mid LAD lesion is 85% stenosed.   1.  Severe underlying three-vessel coronary artery disease with patent grafts including LIMA to LAD, RIMA to ramus and SVG to right PDA.  There is significant mid to distal LAD stenosis proximal to the LIMA anastomosis, significant ostial left circumflex disease but the left circumflex distribution is relatively small.  There is moderate proximal disease affecting the SVG to right PDA. 2.  Moderately reduced LV systolic function with an EF of 35 to 40% with global hypokinesis. 3.  Normal left ventricular end-diastolic pressure.   Recommendations: No clear culprit is identified for the patient's non-ST elevation myocardial infarction.  Vessel occlusion with spontaneous recanalization is a possibility especially in the SVG to right PDA. Discontinue heparin  for now.  I elected to add clopidogrel  for 1 year. Obtain an echocardiogram to better evaluate ejection fraction. I resumed small dose carvedilol  given cardiomyopathy and PVCs.  Monitor for bradycardia.   In the future if the patient has chronic anginal symptoms.  Nuclear stress testing can be considered to see if there are areas of ischemia and guide revascularization if needed.   Recommend  keeping the patient 1 more day and possible discharge home tomorrow. Physical Exam VS:  BP 115/70 (BP Location: Left Arm, Patient Position: Sitting, Cuff Size: Normal)   Pulse (!) 54   Ht 5' 9 (1.753 m)   Wt 216 lb 3.2 oz (98.1 kg)   SpO2 96%   BMI 31.93 kg/m        Wt Readings from Last 3 Encounters:  10/10/23 216 lb 3.2 oz (98.1 kg)  11/30/22 218 lb 0.6 oz (98.9 kg)  07/12/22 218 lb (98.9 kg)    GEN: Well nourished, well developed in no acute distress NECK: No JVD; No carotid bruits CARDIAC: RRR, no murmurs, rubs, gallops RESPIRATORY:  Clear to auscultation without rales, wheezing or rhonchi  ABDOMEN: Soft, non-tender, non-distended EXTREMITIES:  No edema; No deformity   ASSESSMENT AND PLAN  HFimpEF History of ICM with improved EF to 50-55%. Repeat echo yearly. The patient is euvolemic on exam.  He takes torsemide  as needed for lower leg edema.  Continue Entresto  24-26 mg twice a day and Jardiance  daily. No BB due to junctional bradycardia.   CAD s/p remote CABG Patient has chronic history of very mild chest pains worse at night.  These are tolerable and unchanged.  He walks for exercise. Continue aspirin 81 mg daily, Lipitor 10 mg daily, Zetia, and sublingual nitroglycerin.  DM2 Most recent A1C 6.3. this is managed by PCP.   HLD LDL 50. Continue Lipitor 10 mg daily and Zetia 10 mg daily  HTN BP is well controlled.  Continue Entresto 24-26 mg twice daily.     Dispo: Follow-up in 1 year  Signed, Jarae Panas VEAR Fishman, PA-C

## 2023-10-10 NOTE — Patient Instructions (Signed)
 Medication Instructions:  Your physician recommends that you continue on your current medications as directed. Please refer to the Current Medication list given to you today.    *If you need a refill on your cardiac medications before your next appointment, please call your pharmacy*  Lab Work: No labs ordered today    Testing/Procedures: Your physician has requested that you have an echocardiogram in 1 year. Echocardiography is a painless test that uses sound waves to create images of your heart. It provides your doctor with information about the size and shape of your heart and how well your heart's chambers and valves are working.   You may receive an ultrasound enhancing agent through an IV if needed to better visualize your heart during the echo. This procedure takes approximately one hour.  There are no restrictions for this procedure.  This will take place at [address] [facility], [place] [address]  Please note: We ask at that you not bring children with you during ultrasound (echo/ vascular) testing. Due to room size and safety concerns, children are not allowed in the ultrasound rooms during exams. Our front office staff cannot provide observation of children in our lobby area while testing is being conducted. An adult accompanying a patient to their appointment will only be allowed in the ultrasound room at the discretion of the ultrasound technician under special circumstances. We apologize for any inconvenience.   Follow-Up: At [facility], you and your health needs are our priority.  As part of our continuing mission to provide you with exceptional heart care, our providers are all part of one team.  This team includes your primary Cardiologist (physician) and Advanced Practice Providers or APPs (Physician Assistants and Nurse Practitioners) who all work together to provide you with the care you need, when you need it.  Your next appointment:    1 year(s)  Provider:   You may see Timothy Gollan, MD or one of the following Advanced Practice Providers on your designated Care Team:   Cadence Winslow, PA-C

## 2023-11-02 ENCOUNTER — Other Ambulatory Visit

## 2024-01-09 NOTE — Progress Notes (Unsigned)
  Electrophysiology Office Follow up Visit Note:    Date:  01/11/2024   ID:  Todd Kelley, DOB 1965-11-05, MRN [id number]  PCP:  Sherial Bail, MD  [facility] Cardiologist:  Evalene Lunger, MD  [facility] Electrophysiologist:  OLE ONEIDA HOLTS, MD    Interval History:     Todd Kelley is a 58 y.o. male who presents for a follow up visit.   The patient last saw [name] Jul 12, 2022.  I previously saw the patient for history of systolic heart failure and an abnormal cardiac MRI.  The patient previously declined ICD implant.  The patient is doing well today.  No syncope or presyncope.  Follows with Dr. Gollan for his general cardiology care.        Past medical, surgical, social and family history were reviewed.  ROS:   Please see the history of present illness.    All other systems reviewed and are negative.  EKGs/Labs/Other Studies Reviewed:    The following studies were reviewed today:  May 06, 2023 echo EF 50-55 RV normal        Physical Exam:    VS:  BP 104/70 (BP Location: Left Arm, Patient Position: Sitting, Cuff Size: Normal)   Pulse (!) 56   Ht 5' 9 (1.753 m)   Wt 219 lb 3.2 oz (99.4 kg)   SpO2 98%   BMI 32.37 kg/m     Wt Readings from Last 3 Encounters:  01/11/24 219 lb 3.2 oz (99.4 kg)  10/10/23 216 lb 3.2 oz (98.1 kg)  11/30/22 218 lb 0.6 oz (98.9 kg)     GEN: no distress CARD: RRR, No MRG RESP: No IWOB. CTAB.      ASSESSMENT:    1. Ischemic cardiomyopathy   2. Coronary artery disease of native artery of native heart with stable angina pectoris   3. Heart failure with improved ejection fraction (HFimpEF) (HCC)    PLAN:    In order of problems listed above:  #Chronic systolic heart failure EF is now recovered, 50 to 55%.  Previous cardiac MRI with 9% burden of LGE.  History of coronary artery disease. NYHA class II.  Warm and dry on exam.  Continue GDMT.  He can follow-up with EP on an as needed basis.  Recommend  routine follow-up with general cardiology moving forward.  #Coronary artery disease Prior CABG Continue aspirin , statin  Follow-up with EP on an as needed basis.  Continue routine care with Dr. Gollan moving forward.   Signed, Ole Holts, MD, [facility], [facility] 01/11/2024 3:09 PM    Electrophysiology [facility]

## 2024-01-11 ENCOUNTER — Ambulatory Visit: Attending: Cardiology | Admitting: Cardiology

## 2024-01-11 ENCOUNTER — Encounter: Payer: Self-pay | Admitting: Cardiology

## 2024-01-11 VITALS — BP 104/70 | HR 56 | Ht 69.0 in | Wt 219.2 lb

## 2024-01-11 DIAGNOSIS — I255 Ischemic cardiomyopathy: Secondary | ICD-10-CM | POA: Diagnosis not present

## 2024-01-11 DIAGNOSIS — I5020 Unspecified systolic (congestive) heart failure: Secondary | ICD-10-CM

## 2024-01-11 DIAGNOSIS — I25118 Atherosclerotic heart disease of native coronary artery with other forms of angina pectoris: Secondary | ICD-10-CM | POA: Diagnosis not present

## 2024-01-11 NOTE — Patient Instructions (Signed)
 Medication Instructions:  Your physician recommends that you continue on your current medications as directed. Please refer to the Current Medication list given to you today.  *If you need a refill on your cardiac medications before your next appointment, please call your pharmacy*  Follow-Up: At [facility], you and your health needs are our priority.  As part of our continuing mission to provide you with exceptional heart care, our providers are all part of one team.  This team includes your primary Cardiologist (physician) and Advanced Practice Providers or APPs (Physician Assistants and Nurse Practitioners) who all work together to provide you with the care you need, when you need it.  Your next appointment:   As needed with EP

## 2024-11-01 ENCOUNTER — Other Ambulatory Visit
